# Patient Record
Sex: Female | Born: 1935
Health system: Southern US, Community
[De-identification: ages and names within clinical notes are randomized; demographics above are authoritative.]

## PROBLEM LIST (undated history)

## (undated) DIAGNOSIS — N3281 Overactive bladder: Secondary | ICD-10-CM

## (undated) DIAGNOSIS — M1710 Unilateral primary osteoarthritis, unspecified knee: Secondary | ICD-10-CM

## (undated) DIAGNOSIS — I454 Nonspecific intraventricular block: Secondary | ICD-10-CM

## (undated) DIAGNOSIS — E785 Hyperlipidemia, unspecified: Secondary | ICD-10-CM

## (undated) DIAGNOSIS — T887XXA Unspecified adverse effect of drug or medicament, initial encounter: Secondary | ICD-10-CM

## (undated) DIAGNOSIS — J0100 Acute maxillary sinusitis, unspecified: Secondary | ICD-10-CM

## (undated) DIAGNOSIS — M810 Age-related osteoporosis without current pathological fracture: Secondary | ICD-10-CM

## (undated) DIAGNOSIS — Z9889 Other specified postprocedural states: Secondary | ICD-10-CM

## (undated) DIAGNOSIS — C44311 Basal cell carcinoma of skin of nose: Secondary | ICD-10-CM

## (undated) DIAGNOSIS — T7840XA Allergy, unspecified, initial encounter: Secondary | ICD-10-CM

## (undated) DIAGNOSIS — K219 Gastro-esophageal reflux disease without esophagitis: Secondary | ICD-10-CM

## (undated) DIAGNOSIS — R112 Nausea with vomiting, unspecified: Secondary | ICD-10-CM

## (undated) DIAGNOSIS — IMO0002 Reserved for concepts with insufficient information to code with codable children: Secondary | ICD-10-CM

## (undated) DIAGNOSIS — G5600 Carpal tunnel syndrome, unspecified upper limb: Secondary | ICD-10-CM

## (undated) DIAGNOSIS — N814 Uterovaginal prolapse, unspecified: Secondary | ICD-10-CM

## (undated) HISTORY — DX: Gastro-esophageal reflux disease without esophagitis: K21.9

## (undated) HISTORY — PX: FLEXIBLE SIGMOIDOSCOPY: SHX1649

## (undated) HISTORY — DX: Uterovaginal prolapse, unspecified: N81.4

## (undated) HISTORY — DX: Allergy, unspecified, initial encounter: T78.40XA

## (undated) HISTORY — PX: JOINT REPLACEMENT: SHX530

## (undated) HISTORY — DX: Overactive bladder: N32.81

## (undated) HISTORY — DX: Reserved for concepts with insufficient information to code with codable children: IMO0002

## (undated) HISTORY — DX: Carpal tunnel syndrome, unspecified upper limb: G56.00

## (undated) HISTORY — DX: Nonspecific intraventricular block: I45.4

## (undated) HISTORY — DX: Acute maxillary sinusitis, unspecified: J01.00

## (undated) HISTORY — DX: Unspecified adverse effect of drug or medicament, initial encounter: T88.7XXA

## (undated) HISTORY — DX: Age-related osteoporosis without current pathological fracture: M81.0

## (undated) HISTORY — PX: HYSTEROSCOPY WITH D & C: SHX1775

## (undated) HISTORY — PX: UPPER GI ENDOSCOPY: SHX6162

## (undated) HISTORY — PX: COLONOSCOPY: SHX174

## (undated) HISTORY — DX: Basal cell carcinoma of skin of nose: C44.311

## (undated) HISTORY — DX: Unilateral primary osteoarthritis, unspecified knee: M17.10

## (undated) HISTORY — PX: OTHER SURGICAL HISTORY: SHX169

## (undated) HISTORY — PX: HYSTEROSCOPY: SHX211

## (undated) HISTORY — PX: EYE SURGERY: SHX253

## (undated) HISTORY — DX: Hyperlipidemia, unspecified: E78.5

---

## 1980-06-02 HISTORY — PX: DILATION AND CURETTAGE OF UTERUS: SHX78

## 1999-06-21 ENCOUNTER — Other Ambulatory Visit: Admission: RE | Admit: 1999-06-21 | Discharge: 1999-06-21 | Payer: Self-pay | Admitting: Obstetrics and Gynecology

## 2000-06-26 ENCOUNTER — Other Ambulatory Visit: Admission: RE | Admit: 2000-06-26 | Discharge: 2000-06-26 | Payer: Self-pay | Admitting: Obstetrics and Gynecology

## 2001-07-07 ENCOUNTER — Other Ambulatory Visit: Admission: RE | Admit: 2001-07-07 | Discharge: 2001-07-07 | Payer: Self-pay | Admitting: Obstetrics and Gynecology

## 2001-10-14 ENCOUNTER — Encounter: Payer: Self-pay | Admitting: Internal Medicine

## 2002-08-24 ENCOUNTER — Other Ambulatory Visit: Admission: RE | Admit: 2002-08-24 | Discharge: 2002-08-24 | Payer: Self-pay | Admitting: Obstetrics and Gynecology

## 2003-01-16 ENCOUNTER — Encounter: Payer: Self-pay | Admitting: Orthopedic Surgery

## 2003-01-23 ENCOUNTER — Encounter: Payer: Self-pay | Admitting: Orthopedic Surgery

## 2003-01-23 ENCOUNTER — Inpatient Hospital Stay (HOSPITAL_COMMUNITY): Admission: RE | Admit: 2003-01-23 | Discharge: 2003-01-27 | Payer: Self-pay | Admitting: Orthopedic Surgery

## 2003-07-21 ENCOUNTER — Encounter (INDEPENDENT_AMBULATORY_CARE_PROVIDER_SITE_OTHER): Payer: Self-pay | Admitting: *Deleted

## 2003-07-21 ENCOUNTER — Ambulatory Visit (HOSPITAL_BASED_OUTPATIENT_CLINIC_OR_DEPARTMENT_OTHER): Admission: RE | Admit: 2003-07-21 | Discharge: 2003-07-21 | Payer: Self-pay | Admitting: Obstetrics and Gynecology

## 2003-07-21 ENCOUNTER — Ambulatory Visit (HOSPITAL_COMMUNITY): Admission: RE | Admit: 2003-07-21 | Discharge: 2003-07-21 | Payer: Self-pay | Admitting: Obstetrics and Gynecology

## 2003-08-28 ENCOUNTER — Other Ambulatory Visit: Admission: RE | Admit: 2003-08-28 | Discharge: 2003-08-28 | Payer: Self-pay | Admitting: Obstetrics and Gynecology

## 2004-04-11 ENCOUNTER — Ambulatory Visit: Payer: Self-pay | Admitting: Internal Medicine

## 2004-07-04 ENCOUNTER — Ambulatory Visit: Payer: Self-pay | Admitting: Internal Medicine

## 2004-08-29 ENCOUNTER — Other Ambulatory Visit: Admission: RE | Admit: 2004-08-29 | Discharge: 2004-08-29 | Payer: Self-pay | Admitting: Obstetrics and Gynecology

## 2004-09-03 ENCOUNTER — Ambulatory Visit: Payer: Self-pay | Admitting: Internal Medicine

## 2004-09-12 ENCOUNTER — Ambulatory Visit: Payer: Self-pay | Admitting: Internal Medicine

## 2004-12-12 ENCOUNTER — Ambulatory Visit: Payer: Self-pay | Admitting: Internal Medicine

## 2005-02-13 ENCOUNTER — Ambulatory Visit: Payer: Self-pay | Admitting: Internal Medicine

## 2005-03-17 ENCOUNTER — Ambulatory Visit: Payer: Self-pay | Admitting: Internal Medicine

## 2005-04-17 ENCOUNTER — Ambulatory Visit: Payer: Self-pay | Admitting: Internal Medicine

## 2005-07-17 ENCOUNTER — Ambulatory Visit: Payer: Self-pay | Admitting: Internal Medicine

## 2005-09-10 ENCOUNTER — Other Ambulatory Visit: Admission: RE | Admit: 2005-09-10 | Discharge: 2005-09-10 | Payer: Self-pay | Admitting: Obstetrics and Gynecology

## 2005-09-15 ENCOUNTER — Ambulatory Visit: Payer: Self-pay | Admitting: Internal Medicine

## 2005-09-22 ENCOUNTER — Ambulatory Visit: Payer: Self-pay | Admitting: Internal Medicine

## 2005-12-22 ENCOUNTER — Ambulatory Visit: Payer: Self-pay | Admitting: Internal Medicine

## 2006-02-26 ENCOUNTER — Ambulatory Visit: Payer: Self-pay | Admitting: Internal Medicine

## 2006-04-16 ENCOUNTER — Ambulatory Visit: Payer: Self-pay | Admitting: Internal Medicine

## 2006-06-11 ENCOUNTER — Ambulatory Visit: Payer: Self-pay | Admitting: Internal Medicine

## 2006-10-15 ENCOUNTER — Ambulatory Visit: Payer: Self-pay | Admitting: Internal Medicine

## 2006-10-16 ENCOUNTER — Other Ambulatory Visit: Admission: RE | Admit: 2006-10-16 | Discharge: 2006-10-16 | Payer: Self-pay | Admitting: Obstetrics and Gynecology

## 2007-02-11 ENCOUNTER — Ambulatory Visit: Payer: Self-pay | Admitting: Internal Medicine

## 2007-02-11 LAB — CONVERTED CEMR LAB
ALT: 27 units/L (ref 0–35)
AST: 28 units/L (ref 0–37)
Basophils Relative: 0.8 % (ref 0.0–1.0)
Bilirubin, Direct: 0.1 mg/dL (ref 0.0–0.3)
CO2: 29 meq/L (ref 19–32)
Calcium: 9.4 mg/dL (ref 8.4–10.5)
Chloride: 108 meq/L (ref 96–112)
Creatinine, Ser: 0.8 mg/dL (ref 0.4–1.2)
Eosinophils Absolute: 0.2 10*3/uL (ref 0.0–0.6)
HCT: 39.4 % (ref 36.0–46.0)
HDL: 96.3 mg/dL (ref >39.0)
Hemoglobin: 13.2 g/dL (ref 12.0–15.0)
Monocytes Absolute: 0.4 10*3/uL (ref 0.2–0.7)
Nitrite: NEGATIVE
Platelets: 245 10*3/uL (ref 150–400)
RDW: 13 % (ref 11.5–14.6)
Specific Gravity, Urine: 1.015
TSH: 2.63 microintl units/mL (ref 0.35–5.50)
Total Protein: 6.3 g/dL (ref 6.0–8.3)
Urobilinogen, UA: 0.2
WBC Urine, dipstick: NEGATIVE
pH: 7.5

## 2007-02-15 DIAGNOSIS — M810 Age-related osteoporosis without current pathological fracture: Secondary | ICD-10-CM

## 2007-02-18 ENCOUNTER — Ambulatory Visit: Payer: Self-pay | Admitting: Internal Medicine

## 2007-02-18 DIAGNOSIS — E785 Hyperlipidemia, unspecified: Secondary | ICD-10-CM

## 2007-03-18 ENCOUNTER — Encounter: Payer: Self-pay | Admitting: Internal Medicine

## 2007-03-18 ENCOUNTER — Ambulatory Visit (HOSPITAL_BASED_OUTPATIENT_CLINIC_OR_DEPARTMENT_OTHER): Admission: RE | Admit: 2007-03-18 | Discharge: 2007-03-18 | Payer: Self-pay | Admitting: Obstetrics and Gynecology

## 2007-03-18 ENCOUNTER — Encounter: Payer: Self-pay | Admitting: Obstetrics and Gynecology

## 2007-03-26 ENCOUNTER — Telehealth: Payer: Self-pay | Admitting: *Deleted

## 2007-03-26 DIAGNOSIS — I454 Nonspecific intraventricular block: Secondary | ICD-10-CM | POA: Insufficient documentation

## 2007-04-02 ENCOUNTER — Ambulatory Visit: Payer: Self-pay | Admitting: Internal Medicine

## 2007-04-15 ENCOUNTER — Ambulatory Visit: Payer: Self-pay

## 2007-04-15 ENCOUNTER — Encounter: Payer: Self-pay | Admitting: Internal Medicine

## 2007-04-20 ENCOUNTER — Telehealth: Payer: Self-pay | Admitting: *Deleted

## 2007-05-20 ENCOUNTER — Ambulatory Visit: Payer: Self-pay | Admitting: Internal Medicine

## 2007-05-20 DIAGNOSIS — C44300 Unspecified malignant neoplasm of skin of unspecified part of face: Secondary | ICD-10-CM | POA: Insufficient documentation

## 2007-07-01 ENCOUNTER — Ambulatory Visit: Payer: Self-pay | Admitting: Internal Medicine

## 2007-09-23 ENCOUNTER — Ambulatory Visit: Payer: Self-pay | Admitting: Internal Medicine

## 2007-09-30 ENCOUNTER — Encounter: Payer: Self-pay | Admitting: Internal Medicine

## 2007-09-30 ENCOUNTER — Ambulatory Visit: Payer: Self-pay | Admitting: Internal Medicine

## 2007-10-21 ENCOUNTER — Other Ambulatory Visit: Admission: RE | Admit: 2007-10-21 | Discharge: 2007-10-21 | Payer: Self-pay | Admitting: Obstetrics and Gynecology

## 2007-10-21 ENCOUNTER — Telehealth: Payer: Self-pay | Admitting: *Deleted

## 2008-02-24 ENCOUNTER — Ambulatory Visit: Payer: Self-pay | Admitting: Internal Medicine

## 2008-02-24 DIAGNOSIS — T887XXA Unspecified adverse effect of drug or medicament, initial encounter: Secondary | ICD-10-CM | POA: Insufficient documentation

## 2008-02-24 LAB — CONVERTED CEMR LAB
ALT: 24 units/L (ref 0–35)
BUN: 27 mg/dL — ABNORMAL HIGH (ref 6–23)
Basophils Absolute: 0.1 10*3/uL (ref 0.0–0.1)
Basophils Relative: 1 % (ref 0.0–3.0)
Bilirubin, Direct: 0.1 mg/dL (ref 0.0–0.3)
CRP, High Sensitivity: 2 (ref 0.00–5.00)
Calcium: 10.3 mg/dL (ref 8.4–10.5)
Cholesterol: 271 mg/dL (ref 0–200)
Creatinine, Ser: 1 mg/dL (ref 0.4–1.2)
Eosinophils Absolute: 0.2 10*3/uL (ref 0.0–0.7)
HDL goal, serum: 40 mg/dL
HDL: 106.3 mg/dL (ref >39.0)
Hemoglobin: 13.8 g/dL (ref 12.0–15.0)
LDL Goal: 160 mg/dL
Monocytes Absolute: 0.4 10*3/uL (ref 0.1–1.0)
Monocytes Relative: 6.9 % (ref 3.0–12.0)
Neutro Abs: 3 10*3/uL (ref 1.4–7.7)
Platelets: 216 10*3/uL (ref 150–400)
RBC: 4.42 M/uL (ref 3.87–5.11)
TSH: 1.76 microintl units/mL (ref 0.35–5.50)
Total CHOL/HDL Ratio: 2.5
Total Protein: 6.9 g/dL (ref 6.0–8.3)
WBC: 5.7 10*3/uL (ref 4.5–10.5)

## 2008-03-27 ENCOUNTER — Ambulatory Visit: Payer: Self-pay | Admitting: Internal Medicine

## 2008-03-27 ENCOUNTER — Telehealth: Payer: Self-pay | Admitting: Internal Medicine

## 2008-03-27 DIAGNOSIS — B009 Herpesviral infection, unspecified: Secondary | ICD-10-CM | POA: Insufficient documentation

## 2008-04-01 LAB — CONVERTED CEMR LAB: Pap Smear: NORMAL

## 2008-06-26 ENCOUNTER — Ambulatory Visit: Payer: Self-pay | Admitting: Obstetrics and Gynecology

## 2008-08-16 ENCOUNTER — Encounter: Payer: Self-pay | Admitting: Internal Medicine

## 2008-08-16 LAB — HM MAMMOGRAPHY

## 2008-08-17 ENCOUNTER — Ambulatory Visit: Payer: Self-pay | Admitting: Internal Medicine

## 2008-09-05 ENCOUNTER — Telehealth (INDEPENDENT_AMBULATORY_CARE_PROVIDER_SITE_OTHER): Payer: Self-pay | Admitting: *Deleted

## 2008-09-14 ENCOUNTER — Ambulatory Visit: Payer: Self-pay | Admitting: Internal Medicine

## 2008-09-14 DIAGNOSIS — J0100 Acute maxillary sinusitis, unspecified: Secondary | ICD-10-CM

## 2008-09-14 DIAGNOSIS — K219 Gastro-esophageal reflux disease without esophagitis: Secondary | ICD-10-CM

## 2008-09-14 DIAGNOSIS — G5600 Carpal tunnel syndrome, unspecified upper limb: Secondary | ICD-10-CM

## 2008-11-09 ENCOUNTER — Ambulatory Visit: Payer: Self-pay | Admitting: Obstetrics and Gynecology

## 2008-11-09 ENCOUNTER — Telehealth: Payer: Self-pay | Admitting: Internal Medicine

## 2008-11-21 LAB — HM DIABETES FOOT EXAM

## 2008-12-28 ENCOUNTER — Ambulatory Visit: Payer: Self-pay | Admitting: Internal Medicine

## 2009-01-15 ENCOUNTER — Telehealth (INDEPENDENT_AMBULATORY_CARE_PROVIDER_SITE_OTHER): Payer: Self-pay | Admitting: *Deleted

## 2009-03-16 ENCOUNTER — Ambulatory Visit: Payer: Self-pay | Admitting: Family Medicine

## 2009-03-30 ENCOUNTER — Ambulatory Visit: Payer: Self-pay | Admitting: Internal Medicine

## 2009-03-30 LAB — CONVERTED CEMR LAB
ALT: 27 units/L (ref 0–35)
AST: 31 units/L (ref 0–37)
Albumin: 4 g/dL (ref 3.5–5.2)
Alkaline Phosphatase: 59 units/L (ref 39–117)
BUN: 20 mg/dL (ref 6–23)
Basophils Absolute: 0 10*3/uL (ref 0.0–0.1)
Basophils Relative: 0.5 % (ref 0.0–3.0)
Bilirubin, Direct: 0.1 mg/dL (ref 0.0–0.3)
CO2: 29 meq/L (ref 19–32)
Calcium: 9.2 mg/dL (ref 8.4–10.5)
Chloride: 104 meq/L (ref 96–112)
Cholesterol: 229 mg/dL — ABNORMAL HIGH (ref 0–200)
Creatinine, Ser: 0.9 mg/dL (ref 0.4–1.2)
Direct LDL: 116.7 mg/dL
Eosinophils Absolute: 0.2 10*3/uL (ref 0.0–0.7)
Eosinophils Relative: 4 % (ref 0.0–5.0)
GFR calc non Af Amer: 64.56 mL/min (ref >60)
Glucose, Bld: 96 mg/dL (ref 70–99)
HCT: 41 % (ref 36.0–46.0)
HDL: 92.8 mg/dL (ref >39.00)
Hemoglobin: 13.7 g/dL (ref 12.0–15.0)
Lymphocytes Relative: 38.9 % (ref 12.0–46.0)
Lymphs Abs: 1.9 10*3/uL (ref 0.7–4.0)
MCHC: 33.3 g/dL (ref 30.0–36.0)
MCV: 91.8 fL (ref 78.0–100.0)
Monocytes Absolute: 0.3 10*3/uL (ref 0.1–1.0)
Monocytes Relative: 6.5 % (ref 3.0–12.0)
Neutro Abs: 2.5 10*3/uL (ref 1.4–7.7)
Neutrophils Relative %: 50.1 % (ref 43.0–77.0)
Platelets: 217 10*3/uL (ref 150.0–400.0)
Potassium: 3.7 meq/L (ref 3.5–5.1)
RBC: 4.47 M/uL (ref 3.87–5.11)
RDW: 13.8 % (ref 11.5–14.6)
Sodium: 145 meq/L (ref 135–145)
TSH: 1.86 microintl units/mL (ref 0.35–5.50)
Total Bilirubin: 0.8 mg/dL (ref 0.3–1.2)
Total Protein: 7.7 g/dL (ref 6.0–8.3)
WBC: 4.9 10*3/uL (ref 4.5–10.5)

## 2009-04-04 ENCOUNTER — Telehealth: Payer: Self-pay | Admitting: Internal Medicine

## 2009-06-02 HISTORY — PX: CARPAL TUNNEL RELEASE: SHX101

## 2009-07-05 ENCOUNTER — Ambulatory Visit: Payer: Self-pay | Admitting: Internal Medicine

## 2009-07-05 DIAGNOSIS — J309 Allergic rhinitis, unspecified: Secondary | ICD-10-CM

## 2009-09-19 ENCOUNTER — Encounter: Payer: Self-pay | Admitting: Internal Medicine

## 2009-10-04 ENCOUNTER — Ambulatory Visit: Payer: Self-pay | Admitting: Internal Medicine

## 2009-10-04 LAB — CONVERTED CEMR LAB
AST: 27 units/L (ref 0–37)
Alkaline Phosphatase: 54 units/L (ref 39–117)
Basophils Absolute: 0 10*3/uL (ref 0.0–0.1)
Bilirubin, Direct: 0.1 mg/dL (ref 0.0–0.3)
Hemoglobin: 13.8 g/dL (ref 12.0–15.0)
Lymphocytes Relative: 35.9 % (ref 12.0–46.0)
Monocytes Relative: 5.8 % (ref 3.0–12.0)
Neutro Abs: 3.1 10*3/uL (ref 1.4–7.7)
RBC: 4.51 M/uL (ref 3.87–5.11)
RDW: 15 % — ABNORMAL HIGH (ref 11.5–14.6)
Total CHOL/HDL Ratio: 2
Total Protein: 6.9 g/dL (ref 6.0–8.3)
VLDL: 14.2 mg/dL (ref 0.0–40.0)
Vit D, 25-Hydroxy: 55 ng/mL (ref 30–89)

## 2009-10-11 ENCOUNTER — Ambulatory Visit: Payer: Self-pay | Admitting: Internal Medicine

## 2009-11-15 ENCOUNTER — Other Ambulatory Visit: Admission: RE | Admit: 2009-11-15 | Discharge: 2009-11-15 | Payer: Self-pay | Admitting: Obstetrics and Gynecology

## 2009-11-15 ENCOUNTER — Ambulatory Visit: Payer: Self-pay | Admitting: Obstetrics and Gynecology

## 2009-12-07 ENCOUNTER — Telehealth: Payer: Self-pay | Admitting: Internal Medicine

## 2009-12-20 ENCOUNTER — Ambulatory Visit: Payer: Self-pay | Admitting: Obstetrics and Gynecology

## 2009-12-20 ENCOUNTER — Encounter: Payer: Self-pay | Admitting: Internal Medicine

## 2010-01-10 ENCOUNTER — Ambulatory Visit: Payer: Self-pay | Admitting: Internal Medicine

## 2010-01-10 DIAGNOSIS — T6391XA Toxic effect of contact with unspecified venomous animal, accidental (unintentional), initial encounter: Secondary | ICD-10-CM | POA: Insufficient documentation

## 2010-02-14 ENCOUNTER — Ambulatory Visit: Payer: Self-pay | Admitting: Internal Medicine

## 2010-05-09 ENCOUNTER — Ambulatory Visit: Payer: Self-pay | Admitting: Internal Medicine

## 2010-05-09 LAB — CONVERTED CEMR LAB
Alkaline Phosphatase: 55 units/L (ref 39–117)
BUN: 17 mg/dL (ref 6–23)
Bilirubin, Direct: 0.1 mg/dL (ref 0.0–0.3)
CO2: 29 meq/L (ref 19–32)
Calcium: 9.7 mg/dL (ref 8.4–10.5)
Glucose, Bld: 100 mg/dL — ABNORMAL HIGH (ref 70–99)
HDL: 98.6 mg/dL (ref >39.00)
Potassium: 4.3 meq/L (ref 3.5–5.1)
Sodium: 141 meq/L (ref 135–145)
Total Bilirubin: 0.6 mg/dL (ref 0.3–1.2)
VLDL: 12.6 mg/dL (ref 0.0–40.0)

## 2010-05-16 ENCOUNTER — Ambulatory Visit: Payer: Self-pay | Admitting: Internal Medicine

## 2010-05-16 DIAGNOSIS — H409 Unspecified glaucoma: Secondary | ICD-10-CM | POA: Insufficient documentation

## 2010-07-04 NOTE — Progress Notes (Signed)
Summary: INFO ONLY  Phone Note Call from Patient   Caller: Patient  [phone] Summary of Call: Pt called in to speak with [facility], LPN... Pt was adv that [place], LPN is out of office / will return on 12/10/2009... Pt adv that was fine, she just wanted to leave msg:  Pt adv she is supposed to have a Bone Density Test done on December 20, 2009 and she will be having this done at [facility].... Results will be sent to Dr Lovell Sheehan.  Initial call taken by: Debbra Riding,  December 07, 2009 2:25 PM

## 2010-07-04 NOTE — Assessment & Plan Note (Signed)
Summary: 3 month rov/njr   Vital Signs:  Patient profile:   77 year old female Height:      65 inches Weight:      152 pounds BMI:     25.39 Temp:     98.2 degrees F oral Pulse rate:   72 / minute Resp:     14 per minute BP sitting:   124 / 76  (left arm)  Vitals Entered By: Willy Eddy, LPN (January 10, 2010 9:09 AM) CC: roa bone density- bee sting on rt upper arm with redness and swelling, Lipid Management Is Patient Diabetic? No   CC:  roa bone density- bee sting on rt upper arm with redness and swelling and Lipid Management.  History of Present Illness: Had a bee sting yesterday and now has an area of reaction 8 x 6 cm other wise she remains active in swiming and exercize she is using saline and this has helped bone denstity reviewed and she is stable "osteopenia"  Lipid Management History:      Positive NCEP/ATP III risk factors include female age 69 years old or older.  Negative NCEP/ATP III risk factors include no history of early menopause without estrogen hormone replacement, non-diabetic, HDL cholesterol greater than 60, no family history for ischemic heart disease, non-tobacco-user status, non-hypertensive, no ASHD (atherosclerotic heart disease), no prior stroke/TIA, and no peripheral vascular disease.        The patient states that she knows about the "Therapeutic Lifestyle Change" diet.     Preventive Screening-Counseling & Management  Alcohol-Tobacco     Smoking Status: never     Passive Smoke Exposure: no     Tobacco Counseling: not indicated; no tobacco use  Problems Prior to Update: 1)  Allergic Rhinitis Cause Unspecified  (ICD-477.9) 2)  Carpal Tunnel Syndrome, Right  (ICD-354.0) 3)  Gerd  (ICD-530.81) 4)  Acute Maxillary Sinusitis  (ICD-461.0) 5)  Fever Blister  (ICD-054.9) 6)  Dysfunction of Eustachian Tube  (ICD-381.81) 7)  Uns Advrs Eff Uns Rx Medicinal&biological Sbstnc  (ICD-995.20) 8)  Cellulitis and Abscess of Face  (ICD-682.0) 9)  Basal  Cell Carcinoma, Nose  (ICD-173.3) 10)  Bundle Branch Block  (ICD-426.50) 11)  Preventive Health Care  (ICD-V70.0) 12)  Osteoarthrosis, Local Nos, Lower Leg  (ICD-715.36) 13)  Hyperlipidemia  (ICD-272.4) 14)  Family History Diabetes 1st Degree Relative  (ICD-V18.0) 15)  Family History Breast Cancer 1st Degree Relative <50  (ICD-V16.3) 16)  Osteoporosis  (ICD-733.00)  Current Problems (verified): 1)  Allergic Rhinitis Cause Unspecified  (ICD-477.9) 2)  Carpal Tunnel Syndrome, Right  (ICD-354.0) 3)  Gerd  (ICD-530.81) 4)  Acute Maxillary Sinusitis  (ICD-461.0) 5)  Fever Blister  (ICD-054.9) 6)  Dysfunction of Eustachian Tube  (ICD-381.81) 7)  Uns Advrs Eff Uns Rx Medicinal&biological Sbstnc  (ICD-995.20) 8)  Cellulitis and Abscess of Face  (ICD-682.0) 9)  Basal Cell Carcinoma, Nose  (ICD-173.3) 10)  Bundle Branch Block  (ICD-426.50) 11)  Preventive Health Care  (ICD-V70.0) 12)  Osteoarthrosis, Local Nos, Lower Leg  (ICD-715.36) 13)  Hyperlipidemia  (ICD-272.4) 14)  Family History Diabetes 1st Degree Relative  (ICD-V18.0) 15)  Family History Breast Cancer 1st Degree Relative <50  (ICD-V16.3) 16)  Osteoporosis  (ICD-733.00)  Medications Prior to Update: 1)  Adult Aspirin Ec Low Strength 81 Mg  Tbec (Aspirin) .... Once Daily 2)  Bl Vitamin E 400 Unit  Caps (Vitamin E) .... Once Daily 3)  Mobic 15 Mg Tabs (Meloxicam) .Marland Kitchen.. 1 Once Daily 4)  Calcium 600 1500 Mg  Tabs (Calcium Carbonate) .... Two Once Daily 5)  Calcium-Magnesium-Zinc 1000-400-15 Mg  Tabs (Calcium-Magnesium-Zinc) .... 3 Once Daily 6)  Vitamin C 1000 Mg  Tabs (Ascorbic Acid) .... Once Daily 7)  Womens Multivitamin Plus   Tabs (Multiple Vitamins-Minerals) .... Once Daily 8)  Fish Oil 1000 Mg  Caps (Omega-3 Fatty Acids) .... 4 Once Daily 9)  Vitamin D-1000 Max St [phone] Mg-Unit  Tabs (Calcium-Vitamin D) .Marland Kitchen.. 1` Once Daily 10)  Nasonex 50 Mcg/act Susp (Mometasone Furoate) .... Two Sprays Q Nare Daily 11)  Zovirax 5 % Oint  (Acyclovir) .... Apply To Site As Needed Up To 5 Times A Day 12)  Prilosec 20 Mg Cpdr (Omeprazole) .... One By Mouth Daily 13)  Vitamin B-6 Cr 200 Mg Cr-Tabs (Pyridoxine Hcl) .Marland Kitchen.. 1 Once Daily  Current Medications (verified): 1)  Adult Aspirin Ec Low Strength 81 Mg  Tbec (Aspirin) .... Once Daily 2)  Bl Vitamin E 400 Unit  Caps (Vitamin E) .... Once Daily 3)  Mobic 15 Mg Tabs (Meloxicam) .Marland Kitchen.. 1 Once Daily 4)  Calcium 600 1500 Mg  Tabs (Calcium Carbonate) .... Two Once Daily 5)  Calcium-Magnesium-Zinc 1000-400-15 Mg  Tabs (Calcium-Magnesium-Zinc) .... 3 Once Daily 6)  Vitamin C 1000 Mg  Tabs (Ascorbic Acid) .... Once Daily 7)  Womens Multivitamin Plus   Tabs (Multiple Vitamins-Minerals) .... Once Daily 8)  Fish Oil 1000 Mg  Caps (Omega-3 Fatty Acids) .... 4 Once Daily 9)  Vitamin D-1000 Max St [phone] Mg-Unit  Tabs (Calcium-Vitamin D) .Marland Kitchen.. 1` Once Daily 10)  Nasonex 50 Mcg/act Susp (Mometasone Furoate) .... Two Sprays Q Nare Daily 11)  Zovirax 5 % Oint (Acyclovir) .... Apply To Site As Needed Up To 5 Times A Day 12)  Prilosec 20 Mg Cpdr (Omeprazole) .... One By Mouth Daily 13)  Vitamin B-6 Cr 200 Mg Cr-Tabs (Pyridoxine Hcl) .Marland Kitchen.. 1 Once Daily  Allergies (verified): 1)  ! * Aleve 2)  ! Relafen 3)  ! Tramadol Hcl (Tramadol Hcl) 4)  * Voltarin Gel  Past History:  Family History: Last updated: 02/15/2007 Family History Breast cancer 1st degree relative <50 Family History Diabetes 1st degree relative Family History of Stroke M 1st degree relative <50 Family History of Neurological disorder  Social History: Last updated: 02/15/2007 Occupation: Married Former Smoker Alcohol use-yes Drug use-no  Risk Factors: Smoking Status: never (01/10/2010) Passive Smoke Exposure: no (01/10/2010)  Past medical, surgical, family and social histories (including risk factors) reviewed, and no changes noted (except as noted below).  Past Medical History: Reviewed history from 09/14/2008 and no  changes required. ( updated 09/14/2008) CARPAL TUNNEL SYNDROME, RIGHT (ICD-354.0) GERD (ICD-530.81) ACUTE MAXILLARY SINUSITIS (ICD-461.0) UNS ADVRS EFF UNS RX MEDICINAL&BIOLOGICAL SBSTNC (ICD-995.20) BASAL CELL CARCINOMA, NOSE (ICD-173.3) BUNDLE BRANCH BLOCK (ICD-426.50) OSTEOARTHROSIS, LOCAL NOS, LOWER LEG (ICD-715.36) HYPERLIPIDEMIA (ICD-272.4) FAMILY HISTORY DIABETES 1ST DEGREE RELATIVE (ICD-V18.0) FAMILY HISTORY BREAST CANCER 1ST DEGREE RELATIVE <50 (ICD-V16.3) OSTEOPOROSIS (ICD-733.00)  Past Surgical History: Reviewed history from 02/18/2007 and no changes required. Arthroscopic R Knee Flexible Sigmoidoscopy Total hip replacement bilateral  Family History: Reviewed history from 02/15/2007 and no changes required. Family History Breast cancer 1st degree relative <50 Family History Diabetes 1st degree relative Family History of Stroke M 1st degree relative <50 Family History of Neurological disorder  Social History: Reviewed history from 02/15/2007 and no changes required. Occupation: Married Former Smoker Alcohol use-yes Drug use-no  Review of Systems  The patient denies anorexia, fever, weight loss, weight gain, vision loss, decreased  hearing, hoarseness, chest pain, syncope, dyspnea on exertion, peripheral edema, prolonged cough, headaches, hemoptysis, abdominal pain, melena, hematochezia, severe indigestion/heartburn, hematuria, incontinence, genital sores, muscle weakness, suspicious skin lesions, transient blindness, difficulty walking, depression, unusual weight change, abnormal bleeding, enlarged lymph nodes, angioedema, and breast masses.    Physical Exam  General:  patient is alert and in no distress Head:  normocephalic and atraumatic.   Eyes:  pupils equal and pupils round.   Ears:  R ear normal and L ear normal.   Nose:  no external deformity and no nasal discharge.   Mouth:  pharynx pink and moist and no erythema.   Neck:  No deformities, masses, or  tenderness noted. Lungs:  clear to auscultation throughout with symmetric breath sounds. Heart:  regular rhythm and rate Abdomen:  soft, non-tender, normal bowel sounds, no distention, and no masses.   Msk:  no joint warmth, no redness over joints, joint swelling, and enlarged MCP joints.   Extremities:  trace left pedal edema and trace right pedal edema.   Neurologic:  alert & oriented X3 and DTRs symmetrical and normal.     Impression & Recommendations:  Problem # 1:  ALLERGIC RHINITIS CAUSE UNSPECIFIED (ICD-477.9)  use of salt water lavage discussed Her updated medication list for this problem includes:    Nasonex 50 Mcg/act Susp (Mometasone furoate) .Marland Kitchen..Marland Kitchen Two sprays q nare daily  Discussed use of allergy medications and environmental measures.   Problem # 2:  CARPAL TUNNEL SYNDROME, RIGHT (ICD-354.0)  surgery april 29th and just getting back all sensation  Labs Reviewed: TSH: 3.80 (10/04/2009)     Problem # 3:  OSTEOARTHROSIS, LOCAL NOS, LOWER LEG (ICD-715.36)  Her updated medication list for this problem includes:    Adult Aspirin Ec Low Strength 81 Mg Tbec (Aspirin) ..... Once daily    Mobic 15 Mg Tabs (Meloxicam) .Marland Kitchen... 1 once daily  Discussed use of medications, application of heat or cold, and exercises.   Problem # 4:  OSTEOPENIA (ICD-733.90)  on calcum and vit d  Discussed medication use, applications of heat or ice, and exercises.   Complete Medication List: 1)  Adult Aspirin Ec Low Strength 81 Mg Tbec (Aspirin) .... Once daily 2)  Bl Vitamin E 400 Unit Caps (Vitamin e) .... Once daily 3)  Mobic 15 Mg Tabs (Meloxicam) .Marland Kitchen.. 1 once daily 4)  Calcium 600 1500 Mg Tabs (Calcium carbonate) .... Two once daily 5)  Calcium-magnesium-zinc 1000-400-15 Mg Tabs (Calcium-magnesium-zinc) .... 3 once daily 6)  Vitamin C 1000 Mg Tabs (Ascorbic acid) .... Once daily 7)  Womens Multivitamin Plus Tabs (Multiple vitamins-minerals) .... Once daily 8)  Fish Oil 1000 Mg Caps  (Omega-3 fatty acids) .... 4 once daily 9)  Vitamin D-1000 Max St [phone] Mg-unit Tabs (Calcium-vitamin d) .Marland Kitchen.. 1` once daily 10)  Nasonex 50 Mcg/act Susp (Mometasone furoate) .... Two sprays q nare daily 11)  Zovirax 5 % Oint (Acyclovir) .... Apply to site as needed up to 5 times a day 12)  Prilosec 20 Mg Cpdr (Omeprazole) .... One by mouth daily 13)  Vitamin B-6 Cr 200 Mg Cr-tabs (Pyridoxine hcl) .Marland Kitchen.. 1 once daily  Lipid Assessment/Plan:      Based on NCEP/ATP III, the patient's risk factor category is "0-1 risk factors".  The patient's lipid goals are as follows: Total cholesterol goal is 200; LDL cholesterol goal is 160; HDL cholesterol goal is 40; Triglyceride goal is 150.  Her LDL cholesterol goal has been met.    Patient Instructions:  1)  Please schedule a follow-up appointment in 4 months. 2)  BMP prior to visit, ICD-9:995.20 3)  Hepatic Panel prior to visit, ICD-9:995.20 4)  Lipid Panel prior to visit, ICD-9:272.0

## 2010-07-04 NOTE — Assessment & Plan Note (Signed)
Summary: FLU SHOT/CJR  Nurse Visit   Review of Systems       Flu Vaccine Consent Questions     Do you have a history of severe allergic reactions to this vaccine? no    Any prior history of allergic reactions to egg and/or gelatin? no    Do you have a sensitivity to the preservative Thimersol? no    Do you have a past history of Guillan-Barre Syndrome? no    Do you currently have an acute febrile illness? no    Have you ever had a severe reaction to latex? no    Vaccine information given and explained to patient? yes    Are you currently pregnant? no    Lot Number:[id number]   Exp Date:11/30/2010   Site Given  Left Deltoid IM Josph Macho RMA  February 14, 2010 3:11 PM    Allergies: 1)  ! * Aleve 2)  ! Relafen 3)  ! Tramadol Hcl (Tramadol Hcl) 4)  * Voltarin Gel  Orders Added: 1)  Flu Vaccine 84yrs + MEDICARE PATIENTS [Q2039] 2)  Administration Flu vaccine - MCR [G0008]

## 2010-07-04 NOTE — Assessment & Plan Note (Signed)
Summary: 3 month rov/njr   Vital Signs:  Patient profile:   77 year old female Height:      65 inches Weight:      155 pounds BMI:     25.89 Temp:     98.2 degrees F oral Pulse rate:   72 / minute Resp:     14 per minute BP sitting:   130 / 78  (left arm)  Vitals Entered By: Willy Eddy, LPN (Oct 11, 2009 10:28 AM) CC: roa labs   CC:  roa labs.  History of Present Illness: The pt has multiple lab studies to reveiw mobic for arthritis is the only drug the pt feels that she is  prone for" dementia"  Hyperlipidemia Follow-Up      This is a 77 year old woman who presents for Hyperlipidemia follow-up.  The patient denies muscle aches, GI upset, abdominal pain, flushing, itching, constipation, diarrhea, and fatigue.  The patient denies the following symptoms: chest pain/pressure, exercise intolerance, dypsnea, palpitations, syncope, and pedal edema.  Dietary compliance has been excellent.  The patient reports exercising daily.    Preventive Screening-Counseling & Management  Alcohol-Tobacco     Smoking Status: never     Passive Smoke Exposure: no  Problems Prior to Update: 1)  Allergic Rhinitis Cause Unspecified  (ICD-477.9) 2)  Carpal Tunnel Syndrome, Right  (ICD-354.0) 3)  Gerd  (ICD-530.81) 4)  Acute Maxillary Sinusitis  (ICD-461.0) 5)  Fever Blister  (ICD-054.9) 6)  Dysfunction of Eustachian Tube  (ICD-381.81) 7)  Uns Advrs Eff Uns Rx Medicinal&biological Sbstnc  (ICD-995.20) 8)  Cellulitis and Abscess of Face  (ICD-682.0) 9)  Basal Cell Carcinoma, Nose  (ICD-173.3) 10)  Bundle Branch Block  (ICD-426.50) 11)  Preventive Health Care  (ICD-V70.0) 12)  Osteoarthrosis, Local Nos, Lower Leg  (ICD-715.36) 13)  Hyperlipidemia  (ICD-272.4) 14)  Family History Diabetes 1st Degree Relative  (ICD-V18.0) 15)  Family History Breast Cancer 1st Degree Relative <50  (ICD-V16.3) 16)  Osteoporosis  (ICD-733.00)  Current Problems (verified): 1)  Allergic Rhinitis Cause  Unspecified  (ICD-477.9) 2)  Carpal Tunnel Syndrome, Right  (ICD-354.0) 3)  Gerd  (ICD-530.81) 4)  Acute Maxillary Sinusitis  (ICD-461.0) 5)  Fever Blister  (ICD-054.9) 6)  Dysfunction of Eustachian Tube  (ICD-381.81) 7)  Uns Advrs Eff Uns Rx Medicinal&biological Sbstnc  (ICD-995.20) 8)  Cellulitis and Abscess of Face  (ICD-682.0) 9)  Basal Cell Carcinoma, Nose  (ICD-173.3) 10)  Bundle Branch Block  (ICD-426.50) 11)  Preventive Health Care  (ICD-V70.0) 12)  Osteoarthrosis, Local Nos, Lower Leg  (ICD-715.36) 13)  Hyperlipidemia  (ICD-272.4) 14)  Family History Diabetes 1st Degree Relative  (ICD-V18.0) 15)  Family History Breast Cancer 1st Degree Relative <50  (ICD-V16.3) 16)  Osteoporosis  (ICD-733.00)  Medications Prior to Update: 1)  Adult Aspirin Ec Low Strength 81 Mg  Tbec (Aspirin) .... Once Daily 2)  Bl Vitamin E 400 Unit  Caps (Vitamin E) .... Once Daily 3)  Mobic 15 Mg Tabs (Meloxicam) .Marland Kitchen.. 1 Once Daily 4)  Calcium 600 1500 Mg  Tabs (Calcium Carbonate) .... Two Once Daily 5)  Calcium-Magnesium-Zinc 1000-400-15 Mg  Tabs (Calcium-Magnesium-Zinc) .... 3 Once Daily 6)  Vitamin C 1000 Mg  Tabs (Ascorbic Acid) .... Once Daily 7)  Womens Multivitamin Plus   Tabs (Multiple Vitamins-Minerals) .... Once Daily 8)  Fish Oil 1000 Mg  Caps (Omega-3 Fatty Acids) .... 4 Once Daily 9)  Vitamin D-1000 Max St [phone] Mg-Unit  Tabs (Calcium-Vitamin D) .Marland Kitchen.. 1`  Once Daily 10)  Nasonex 50 Mcg/act Susp (Mometasone Furoate) .... Two Sprays Q Nare Daily 11)  Zovirax 5 % Oint (Acyclovir) .... Apply To Site As Needed Up To 5 Times A Day 12)  Astepro 0.15 % Soln (Azelastine Hcl) .... Two Sprays Q Nare Daily 13)  Prilosec 20 Mg Cpdr (Omeprazole) .... One By Mouth Daily  Current Medications (verified): 1)  Adult Aspirin Ec Low Strength 81 Mg  Tbec (Aspirin) .... Once Daily 2)  Bl Vitamin E 400 Unit  Caps (Vitamin E) .... Once Daily 3)  Mobic 15 Mg Tabs (Meloxicam) .Marland Kitchen.. 1 Once Daily 4)  Calcium 600 1500  Mg  Tabs (Calcium Carbonate) .... Two Once Daily 5)  Calcium-Magnesium-Zinc 1000-400-15 Mg  Tabs (Calcium-Magnesium-Zinc) .... 3 Once Daily 6)  Vitamin C 1000 Mg  Tabs (Ascorbic Acid) .... Once Daily 7)  Womens Multivitamin Plus   Tabs (Multiple Vitamins-Minerals) .... Once Daily 8)  Fish Oil 1000 Mg  Caps (Omega-3 Fatty Acids) .... 4 Once Daily 9)  Vitamin D-1000 Max St [phone] Mg-Unit  Tabs (Calcium-Vitamin D) .Marland Kitchen.. 1` Once Daily 10)  Nasonex 50 Mcg/act Susp (Mometasone Furoate) .... Two Sprays Q Nare Daily 11)  Zovirax 5 % Oint (Acyclovir) .... Apply To Site As Needed Up To 5 Times A Day 12)  Astepro 0.15 % Soln (Azelastine Hcl) .... Two Sprays Q Nare Daily 13)  Prilosec 20 Mg Cpdr (Omeprazole) .... One By Mouth Daily 14)  Vitamin B-6 Cr 200 Mg Cr-Tabs (Pyridoxine Hcl) .Marland Kitchen.. 1 Once Daily  Allergies (verified): 1)  ! * Aleve 2)  ! Relafen 3)  ! Tramadol Hcl (Tramadol Hcl) 4)  * Voltarin Gel  Past History:  Family History: Last updated: 02/15/2007 Family History Breast cancer 1st degree relative <50 Family History Diabetes 1st degree relative Family History of Stroke M 1st degree relative <50 Family History of Neurological disorder  Social History: Last updated: 02/15/2007 Occupation: Married Former Smoker Alcohol use-yes Drug use-no  Risk Factors: Smoking Status: never (10/11/2009) Passive Smoke Exposure: no (10/11/2009)  Past medical, surgical, family and social histories (including risk factors) reviewed, and no changes noted (except as noted below).  Past Medical History: Reviewed history from 09/14/2008 and no changes required. ( updated 09/14/2008) CARPAL TUNNEL SYNDROME, RIGHT (ICD-354.0) GERD (ICD-530.81) ACUTE MAXILLARY SINUSITIS (ICD-461.0) UNS ADVRS EFF UNS RX MEDICINAL&BIOLOGICAL SBSTNC (ICD-995.20) BASAL CELL CARCINOMA, NOSE (ICD-173.3) BUNDLE BRANCH BLOCK (ICD-426.50) OSTEOARTHROSIS, LOCAL NOS, LOWER LEG (ICD-715.36) HYPERLIPIDEMIA (ICD-272.4) FAMILY  HISTORY DIABETES 1ST DEGREE RELATIVE (ICD-V18.0) FAMILY HISTORY BREAST CANCER 1ST DEGREE RELATIVE <50 (ICD-V16.3) OSTEOPOROSIS (ICD-733.00)  Past Surgical History: Reviewed history from 02/18/2007 and no changes required. Arthroscopic R Knee Flexible Sigmoidoscopy Total hip replacement bilateral  Family History: Reviewed history from 02/15/2007 and no changes required. Family History Breast cancer 1st degree relative <50 Family History Diabetes 1st degree relative Family History of Stroke M 1st degree relative <50 Family History of Neurological disorder  Social History: Reviewed history from 02/15/2007 and no changes required. Occupation: Married Former Smoker Alcohol use-yes Drug use-no  Review of Systems  The patient denies anorexia, fever, weight loss, weight gain, vision loss, decreased hearing, hoarseness, chest pain, syncope, dyspnea on exertion, peripheral edema, prolonged cough, headaches, hemoptysis, abdominal pain, melena, hematochezia, severe indigestion/heartburn, hematuria, incontinence, genital sores, muscle weakness, suspicious skin lesions, transient blindness, difficulty walking, depression, unusual weight change, abnormal bleeding, enlarged lymph nodes, angioedema, and breast masses.    Physical Exam  General:  patient is alert and in no distress Head:  normocephalic and  atraumatic.   Eyes:  pupils equal and pupils round.   Ears:  R ear normal and L ear normal.   Nose:  no external deformity and no nasal discharge.   Mouth:  pharynx pink and moist and no erythema.   Neck:  No deformities, masses, or tenderness noted. Lungs:  clear to auscultation throughout with symmetric breath sounds. Heart:  regular rhythm and rate Abdomen:  soft, non-tender, normal bowel sounds, no distention, and no masses.     Impression & Recommendations:  Problem # 1:  HYPERLIPIDEMIA (ICD-272.4)  Labs Reviewed: SGOT: 27 (10/04/2009)   SGPT: 25 (10/04/2009)  Lipid  Goals: Chol Goal: 200 (02/24/2008)   HDL Goal: 40 (02/24/2008)   LDL Goal: 160 (02/24/2008)   TG Goal: 150 (02/24/2008)  Prior 10 Yr Risk Heart Disease: Not enough information (02/24/2008)   HDL:113.20 (10/04/2009), 92.80 (03/30/2009)  LDL:DEL (02/24/2008), DEL (02/11/2007)  Chol:255 (10/04/2009), 229 (03/30/2009)  Trig:71.0 (10/04/2009), 88 (02/24/2008)  Problem # 2:  GERD (ICD-530.81) not taking Her updated medication list for this problem includes:    Prilosec 20 Mg Cpdr (Omeprazole) ..... One by mouth daily  Labs Reviewed: Hgb: 13.8 (10/04/2009)   Hct: 40.1 (10/04/2009)  Problem # 3:  OSTEOPOROSIS (ICD-733.00) one d Discussed medication use, applications of heat or ice, and exercises.   Problem # 4:  CARPAL TUNNEL SYNDROME, RIGHT (ICD-354.0) comprtede surgery  Complete Medication List: 1)  Adult Aspirin Ec Low Strength 81 Mg Tbec (Aspirin) .... Once daily 2)  Bl Vitamin E 400 Unit Caps (Vitamin e) .... Once daily 3)  Mobic 15 Mg Tabs (Meloxicam) .Marland Kitchen.. 1 once daily 4)  Calcium 600 1500 Mg Tabs (Calcium carbonate) .... Two once daily 5)  Calcium-magnesium-zinc 1000-400-15 Mg Tabs (Calcium-magnesium-zinc) .... 3 once daily 6)  Vitamin C 1000 Mg Tabs (Ascorbic acid) .... Once daily 7)  Womens Multivitamin Plus Tabs (Multiple vitamins-minerals) .... Once daily 8)  Fish Oil 1000 Mg Caps (Omega-3 fatty acids) .... 4 once daily 9)  Vitamin D-1000 Max St [phone] Mg-unit Tabs (Calcium-vitamin d) .Marland Kitchen.. 1` once daily 10)  Nasonex 50 Mcg/act Susp (Mometasone furoate) .... Two sprays q nare daily 11)  Zovirax 5 % Oint (Acyclovir) .... Apply to site as needed up to 5 times a day 12)  Prilosec 20 Mg Cpdr (Omeprazole) .... One by mouth daily 13)  Vitamin B-6 Cr 200 Mg Cr-tabs (Pyridoxine hcl) .Marland Kitchen.. 1 once daily  Patient Instructions: 1)  Please schedule a follow-up appointment in 3 months. Prescriptions: NASONEX 50 MCG/ACT SUSP (MOMETASONE FUROATE) two sprays q nare daily  #1 x 11   Entered  and Authorized by:   Stacie Glaze MD   Signed by:   Stacie Glaze MD on 10/11/2009   Method used:   Electronically to        [facility]* (retail)       [address]       [place], [place]  [id number]       Ph: [phone]       Fax: [phone]   RxID:   [id number]

## 2010-07-04 NOTE — Assessment & Plan Note (Signed)
Summary: 3 MONTH ROV/NJR/pt rescd from bump//ccm   Vital Signs:  Patient profile:   77 year old female Height:      65 inches Weight:      150 pounds BMI:     25.05 Temp:     98.2 degrees F oral Pulse rate:   72 / minute Resp:     14 per minute BP sitting:   122 / 74  (left arm)  Vitals Entered By: Willy Eddy, LPN (July 05, 2009 12:03 PM) CC: roa- couldnt tolerate tramadol- caused n&v   CC:  roa- couldnt tolerate tramadol- caused n&v.  Preventive Screening-Counseling & Management  Alcohol-Tobacco     Smoking Status: never     Passive Smoke Exposure: no  Problems Prior to Update: 1)  Carpal Tunnel Syndrome, Right  (ICD-354.0) 2)  Gerd  (ICD-530.81) 3)  Acute Maxillary Sinusitis  (ICD-461.0) 4)  Fever Blister  (ICD-054.9) 5)  Dysfunction of Eustachian Tube  (ICD-381.81) 6)  Uns Advrs Eff Uns Rx Medicinal&biological Sbstnc  (ICD-995.20) 7)  Cellulitis and Abscess of Face  (ICD-682.0) 8)  Basal Cell Carcinoma, Nose  (ICD-173.3) 9)  Bundle Branch Block  (ICD-426.50) 10)  Preventive Health Care  (ICD-V70.0) 11)  Osteoarthrosis, Local Nos, Lower Leg  (ICD-715.36) 12)  Hyperlipidemia  (ICD-272.4) 13)  Family History Diabetes 1st Degree Relative  (ICD-V18.0) 14)  Family History Breast Cancer 1st Degree Relative <50  (ICD-V16.3) 15)  Osteoporosis  (ICD-733.00)  Medications Prior to Update: 1)  Adult Aspirin Ec Low Strength 81 Mg  Tbec (Aspirin) .... Once Daily 2)  Boniva 150 Mg  Tabs (Ibandronate Sodium) .... Every Month 3)  Bl Vitamin E 400 Unit  Caps (Vitamin E) .... Once Daily 4)  Mobic 15 Mg Tabs (Meloxicam) .Marland Kitchen.. 1 Once Daily 5)  Calcium 600 1500 Mg  Tabs (Calcium Carbonate) .... Two Once Daily 6)  Calcium-Magnesium-Zinc 1000-400-15 Mg  Tabs (Calcium-Magnesium-Zinc) .... 3 Once Daily 7)  Vitamin C 1000 Mg  Tabs (Ascorbic Acid) .... Once Daily 8)  Womens Multivitamin Plus   Tabs (Multiple Vitamins-Minerals) .... Once Daily 9)  Fish Oil 1000 Mg  Caps (Omega-3  Fatty Acids) .... 4 Once Daily 10)  Vitamin D-1000 Max St [phone] Mg-Unit  Tabs (Calcium-Vitamin D) .Marland Kitchen.. 1` Once Daily 11)  Nasonex 50 Mcg/act Susp (Mometasone Furoate) .... Two Sprays Q Nare Daily 12)  Zovirax 5 % Oint (Acyclovir) .... Apply To Site As Needed Up To 5 Times A Day 13)  Astepro 0.15 % Soln (Azelastine Hcl) .... Two Sprays Q Nare Daily 14)  Prilosec 20 Mg Cpdr (Omeprazole) .... One By Mouth Daily  Current Medications (verified): 1)  Adult Aspirin Ec Low Strength 81 Mg  Tbec (Aspirin) .... Once Daily 2)  Bl Vitamin E 400 Unit  Caps (Vitamin E) .... Once Daily 3)  Mobic 15 Mg Tabs (Meloxicam) .Marland Kitchen.. 1 Once Daily 4)  Calcium 600 1500 Mg  Tabs (Calcium Carbonate) .... Two Once Daily 5)  Calcium-Magnesium-Zinc 1000-400-15 Mg  Tabs (Calcium-Magnesium-Zinc) .... 3 Once Daily 6)  Vitamin C 1000 Mg  Tabs (Ascorbic Acid) .... Once Daily 7)  Womens Multivitamin Plus   Tabs (Multiple Vitamins-Minerals) .... Once Daily 8)  Fish Oil 1000 Mg  Caps (Omega-3 Fatty Acids) .... 4 Once Daily 9)  Vitamin D-1000 Max St [phone] Mg-Unit  Tabs (Calcium-Vitamin D) .Marland Kitchen.. 1` Once Daily 10)  Nasonex 50 Mcg/act Susp (Mometasone Furoate) .... Two Sprays Q Nare Daily 11)  Zovirax 5 % Oint (Acyclovir) .Marland KitchenMarland KitchenMarland Kitchen  Apply To Site As Needed Up To 5 Times A Day 12)  Astepro 0.15 % Soln (Azelastine Hcl) .... Two Sprays Q Nare Daily 13)  Prilosec 20 Mg Cpdr (Omeprazole) .... One By Mouth Daily  Allergies: 1)  ! * Aleve 2)  ! Relafen 3)  ! Tramadol Hcl (Tramadol Hcl) 4)  * Voltarin Gel  Past History:  Family History: Last updated: 02/15/2007 Family History Breast cancer 1st degree relative <50 Family History Diabetes 1st degree relative Family History of Stroke M 1st degree relative <50 Family History of Neurological disorder  Social History: Last updated: 02/15/2007 Occupation: Married Former Smoker Alcohol use-yes Drug use-no  Risk Factors: Smoking Status: never (07/05/2009) Passive Smoke Exposure: no  (07/05/2009)  Past medical, surgical, family and social histories (including risk factors) reviewed, and no changes noted (except as noted below).  Past Medical History: Reviewed history from 09/14/2008 and no changes required. ( updated 09/14/2008) CARPAL TUNNEL SYNDROME, RIGHT (ICD-354.0) GERD (ICD-530.81) ACUTE MAXILLARY SINUSITIS (ICD-461.0) UNS ADVRS EFF UNS RX MEDICINAL&BIOLOGICAL SBSTNC (ICD-995.20) BASAL CELL CARCINOMA, NOSE (ICD-173.3) BUNDLE BRANCH BLOCK (ICD-426.50) OSTEOARTHROSIS, LOCAL NOS, LOWER LEG (ICD-715.36) HYPERLIPIDEMIA (ICD-272.4) FAMILY HISTORY DIABETES 1ST DEGREE RELATIVE (ICD-V18.0) FAMILY HISTORY BREAST CANCER 1ST DEGREE RELATIVE <50 (ICD-V16.3) OSTEOPOROSIS (ICD-733.00)  Past Surgical History: Reviewed history from 02/18/2007 and no changes required. Arthroscopic R Knee Flexible Sigmoidoscopy Total hip replacement bilateral  Family History: Reviewed history from 02/15/2007 and no changes required. Family History Breast cancer 1st degree relative <50 Family History Diabetes 1st degree relative Family History of Stroke M 1st degree relative <50 Family History of Neurological disorder  Social History: Reviewed history from 02/15/2007 and no changes required. Occupation: Married Former Smoker Alcohol use-yes Drug use-no  Review of Systems  The patient denies anorexia, fever, weight loss, weight gain, vision loss, decreased hearing, hoarseness, chest pain, syncope, dyspnea on exertion, peripheral edema, prolonged cough, headaches, hemoptysis, abdominal pain, melena, hematochezia, severe indigestion/heartburn, hematuria, incontinence, genital sores, muscle weakness, suspicious skin lesions, transient blindness, difficulty walking, depression, unusual weight change, abnormal bleeding, enlarged lymph nodes, angioedema, and breast masses.    Physical Exam  General:  patient is alert and in no distress Head:  normocephalic and atraumatic.   Eyes:   pupils equal and pupils round.   Ears:  R ear normal and L ear normal.   Nose:  no external deformity and no nasal discharge.   Mouth:  pharynx pink and moist and no erythema.   Neck:  No deformities, masses, or tenderness noted. Lungs:  clear to auscultation throughout with symmetric breath sounds. Heart:  regular rhythm and rate Abdomen:  soft, non-tender, normal bowel sounds, no distention, and no masses.   Msk:  no joint warmth, no redness over joints, joint swelling, and enlarged MCP joints.   Neurologic:  alert & oriented X3 and DTRs symmetrical and normal.     Impression & Recommendations:  Problem # 1:  HYPERLIPIDEMIA (ICD-272.4)  Labs Reviewed: SGOT: 31 (03/30/2009)   SGPT: 27 (03/30/2009)  Lipid Goals: Chol Goal: 200 (02/24/2008)   HDL Goal: 40 (02/24/2008)   LDL Goal: 160 (02/24/2008)   TG Goal: 150 (02/24/2008)  Prior 10 Yr Risk Heart Disease: Not enough information (02/24/2008)   HDL:92.80 (03/30/2009), 106.3 (02/24/2008)  LDL:DEL (02/24/2008), DEL (02/11/2007)  Chol:229 (03/30/2009), 271 (02/24/2008)  Trig:88 (02/24/2008), 83 (02/11/2007)  Problem # 2:  GERD (ICD-530.81)  Her updated medication list for this problem includes:    Prilosec 20 Mg Cpdr (Omeprazole) ..... One by mouth daily  Labs Reviewed:  Hgb: 13.7 (03/30/2009)   Hct: 41.0 (03/30/2009)  Problem # 3:  OSTEOPOROSIS (ICD-733.00) Assessment: Unchanged discussion of stopping medications, bone density in april The following medications were removed from the medication list:    Boniva 150 Mg Tabs (Ibandronate sodium) ..... Every month  Discussed medication use, applications of heat or ice, and exercises.   Problem # 4:  ALLERGIC RHINITIS CAUSE UNSPECIFIED (ICD-477.9) Assessment: Unchanged using saline off an on Her updated medication list for this problem includes:    Nasonex 50 Mcg/act Susp (Mometasone furoate) .Marland Kitchen..Marland Kitchen Two sprays q nare daily    Astepro 0.15 % Soln (Azelastine hcl) .Marland Kitchen..Marland Kitchen Two sprays q  nare daily  Discussed use of allergy medications and environmental measures.   Problem # 5:  OSTEOARTHROSIS, LOCAL NOS, LOWER LEG (ICD-715.36) Assessment: Unchanged  Her updated medication list for this problem includes:    Adult Aspirin Ec Low Strength 81 Mg Tbec (Aspirin) ..... Once daily    Mobic 15 Mg Tabs (Meloxicam) .Marland Kitchen... 1 once daily  Discussed use of medications, application of heat or cold, and exercises.   Problem # 6:  CARPAL TUNNEL SYNDROME, RIGHT (ICD-354.0) Assessment: Unchanged Informed consen obtained and then the joint was prepped in a sterile manor and 40 mg depo and 1/2 cc 1% lidocaine injected into the synovial space. After care discussed. Pt tolerated procedure well.  Labs Reviewed: TSH: 1.86 (03/30/2009)     Orders: Depo-Medrol 20mg  (J1020) Injection, Tendon / Ligament (46962)  Complete Medication List: 1)  Adult Aspirin Ec Low Strength 81 Mg Tbec (Aspirin) .... Once daily 2)  Bl Vitamin E 400 Unit Caps (Vitamin e) .... Once daily 3)  Mobic 15 Mg Tabs (Meloxicam) .Marland Kitchen.. 1 once daily 4)  Calcium 600 1500 Mg Tabs (Calcium carbonate) .... Two once daily 5)  Calcium-magnesium-zinc 1000-400-15 Mg Tabs (Calcium-magnesium-zinc) .... 3 once daily 6)  Vitamin C 1000 Mg Tabs (Ascorbic acid) .... Once daily 7)  Womens Multivitamin Plus Tabs (Multiple vitamins-minerals) .... Once daily 8)  Fish Oil 1000 Mg Caps (Omega-3 fatty acids) .... 4 once daily 9)  Vitamin D-1000 Max St [phone] Mg-unit Tabs (Calcium-vitamin d) .Marland Kitchen.. 1` once daily 10)  Nasonex 50 Mcg/act Susp (Mometasone furoate) .... Two sprays q nare daily 11)  Zovirax 5 % Oint (Acyclovir) .... Apply to site as needed up to 5 times a day 12)  Astepro 0.15 % Soln (Azelastine hcl) .... Two sprays q nare daily 13)  Prilosec 20 Mg Cpdr (Omeprazole) .... One by mouth daily  Patient Instructions: 1)  ultrasonic cold steam humdifier to use in bedroom at night 2)  Please schedule a follow-up appointment in 3  months. 3)  Hepatic Panel prior to visit, ICD-9:995.20 4)  Lipid Panel prior to visit, ICD-9:272.4 5)  TSH prior to visit, ICD-9:272.4 6)  CBC w/ Diff prior to visit, ICD-9:995.20 7)  calcium 733.00 8)  vit d 733.00

## 2010-07-04 NOTE — Assessment & Plan Note (Signed)
Summary: 4 month rov/njr   Vital Signs:  Patient profile:   77 year old female Height:      65 inches Weight:      156 pounds BMI:     26.05 Temp:     98.2 degrees F oral Pulse rate:   72 / minute Resp:     14 per minute BP sitting:   136 / 80  (left arm)  Vitals Entered By: Willy Eddy, LPN (May 16, 2010 9:37 AM) CC: roa labs Is Patient Diabetic? No   Primary Care Provider:  Stacie Glaze MD  CC:  roa labs.  History of Present Illness: the pt was diagnosed with glacoma and put on latanoprost drops... initially she had a problem with using the drops... 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) 3)  Osteopenia  (ICD-733.90) 4)  Allergic Rhinitis Cause Unspecified  (ICD-477.9) 5)  Carpal Tunnel Syndrome, Right  (ICD-354.0) 6)  Gerd  (ICD-530.81) 7)  Acute Maxillary Sinusitis  (ICD-461.0) 8)  Fever Blister  (ICD-054.9) 9)  Dysfunction of Eustachian Tube  (ICD-381.81) 10)  Uns Advrs Eff Uns Rx Medicinal&biological Sbstnc  (ICD-995.20) 11)  Cellulitis and Abscess of Face  (ICD-682.0) 12)  Basal Cell Carcinoma, Nose  (ICD-173.3) 13)  Bundle Branch Block  (ICD-426.50) 14)  Preventive Health Care  (ICD-V70.0) 15)  Osteoarthrosis, Local Nos, Lower Leg  (ICD-715.36) 16)  Hyperlipidemia  (ICD-272.4) 17)  Family History Diabetes 1st Degree Relative  (ICD-V18.0) 18)  Family History Breast Cancer 1st Degree Relative <50  (ICD-V16.3) 19)  Osteoporosis  (ICD-733.00)  Current Problems (verified): 1)  Bee Sting Reaction, Local   (ICD-989.5) 2)  Osteopenia  (ICD-733.90) 3)  Allergic Rhinitis Cause Unspecified  (ICD-477.9) 4)  Carpal Tunnel Syndrome, Right  (ICD-354.0) 5)  Gerd  (ICD-530.81) 6)  Acute Maxillary Sinusitis  (ICD-461.0) 7)  Fever Blister  (ICD-054.9) 8)  Dysfunction of Eustachian Tube  (ICD-381.81) 9)  Uns Advrs Eff Uns Rx Medicinal&biological Sbstnc  (ICD-995.20) 10)  Cellulitis and Abscess of Face  (ICD-682.0) 11)  Basal Cell Carcinoma, Nose  (ICD-173.3) 12)  Bundle Branch Block  (ICD-426.50) 13)  Preventive Health Care  (ICD-V70.0) 14)  Osteoarthrosis, Local Nos, Lower Leg  (ICD-715.36) 15)  Hyperlipidemia  (ICD-272.4) 16)  Family History Diabetes 1st Degree Relative  (ICD-V18.0) 17)  Family History Breast Cancer 1st Degree Relative <50  (ICD-V16.3) 18)  Osteoporosis  (ICD-733.00)  Medications Prior to Update: 1)  Adult Aspirin Ec Low Strength 81 Mg  Tbec (Aspirin) .... Once Daily 2)  Bl Vitamin E 400 Unit  Caps (Vitamin E) .... Once Daily 3)  Mobic 15 Mg Tabs (Meloxicam) .Marland Kitchen.. 1 Once Daily 4)  Calcium 600 1500 Mg  Tabs (Calcium Carbonate) .... Two Once Daily 5)  Calcium-Magnesium-Zinc 1000-400-15 Mg  Tabs (Calcium-Magnesium-Zinc) .... 3 Once Daily 6)  Vitamin C 1000 Mg  Tabs (Ascorbic Acid) .... Once Daily 7)  Womens Multivitamin Plus   Tabs (Multiple Vitamins-Minerals) .... Once Daily 8)  Fish Oil 1000 Mg  Caps (Omega-3 Fatty Acids) .... 4 Once Daily 9)  Vitamin D-1000 Max St [phone] Mg-Unit  Tabs (Calcium-Vitamin D) .Marland Kitchen.. 1` Once Daily 10)  Nasonex 50 Mcg/act Susp (Mometasone Furoate) .... Two Sprays Q Nare Daily 11)  Zovirax 5 % Oint (Acyclovir) .... Apply To Site As Needed Up To 5 Times A Day 12)  Prilosec 20 Mg Cpdr (Omeprazole) .... One By Mouth Daily 13)  Vitamin B-6 Cr 200 Mg Cr-Tabs (Pyridoxine Hcl) .Marland Kitchen.. 1 Once Daily  Current Medications (verified): 1)  Adult Aspirin Ec Low Strength 81 Mg  Tbec (Aspirin) .... Once Daily 2)  Bl Vitamin E 400 Unit  Caps (Vitamin E) .... Once  Daily 3)  Mobic 15 Mg Tabs (Meloxicam) .Marland Kitchen.. 1 Once Daily 4)  Calcium 600 1500 Mg  Tabs (Calcium Carbonate) .... Two Once Daily 5)  Calcium-Magnesium-Zinc 1000-400-15 Mg  Tabs (Calcium-Magnesium-Zinc) .... 3 Once Daily 6)  Vitamin C 1000 Mg  Tabs (Ascorbic Acid) .... Once Daily 7)  Womens Multivitamin Plus   Tabs (Multiple Vitamins-Minerals) .... Once Daily 8)  Fish Oil 1000 Mg  Caps (Omega-3 Fatty Acids) .... 4 Once Daily 9)  Vitamin D-1000 Max St [phone] Mg-Unit  Tabs (Calcium-Vitamin D) .Marland Kitchen.. 1` Once Daily 10)  Nasonex 50 Mcg/act Susp (Mometasone Furoate) .... Two Sprays Q Nare Daily 11)  Zovirax 5 % Oint (Acyclovir) .... Apply To Site As Needed Up To 5 Times A Day 12)  Prilosec 20 Mg Cpdr (Omeprazole) .... One By Mouth Daily 13)  Vitamin B-6 Cr 200 Mg Cr-Tabs (Pyridoxine Hcl) .Marland Kitchen.. 1 Once Daily 14)  Xalatan 0.005 % Soln (Latanoprost) .Marland Kitchen.. 1 Drop Ou At Bedtime  Allergies (verified): 1)  ! * Aleve 2)  ! Relafen 3)  ! Tramadol Hcl (Tramadol Hcl) 4)  * Voltarin Gel  Past History:  Family History: Last updated: 02/15/2007 Family History Breast cancer 1st degree relative <50 Family History Diabetes 1st degree relative Family History of Stroke M 1st degree relative <50 Family History of Neurological disorder  Social History: Last updated: 02/15/2007 Occupation: Married Former Smoker Alcohol use-yes Drug use-no  Risk Factors: Smoking Status: never (05/16/2010) Passive Smoke Exposure: no (05/16/2010)  Past medical, surgical, family and social histories (including risk factors) reviewed, and no changes noted (except as noted below).  Past Medical History: Reviewed history from 09/14/2008 and no changes required. ( updated 09/14/2008) CARPAL TUNNEL SYNDROME, RIGHT (ICD-354.0) GERD (ICD-530.81) ACUTE MAXILLARY SINUSITIS (ICD-461.0) UNS ADVRS EFF UNS RX MEDICINAL&BIOLOGICAL SBSTNC (ICD-995.20) BASAL CELL CARCINOMA, NOSE (ICD-173.3) BUNDLE BRANCH BLOCK  (ICD-426.50) OSTEOARTHROSIS, LOCAL NOS, LOWER LEG (ICD-715.36) HYPERLIPIDEMIA (ICD-272.4) FAMILY HISTORY DIABETES 1ST DEGREE RELATIVE (ICD-V18.0) FAMILY HISTORY BREAST CANCER 1ST DEGREE RELATIVE <50 (ICD-V16.3) OSTEOPOROSIS (ICD-733.00)  Past Surgical History: Reviewed history from 02/18/2007 and no changes required. Arthroscopic R Knee Flexible Sigmoidoscopy Total hip replacement bilateral  Family History: Reviewed history from 02/15/2007 and no changes required. Family History Breast cancer 1st degree relative <50 Family History Diabetes 1st degree relative Family History of Stroke M 1st degree relative <50 Family History of Neurological disorder  Social History: Reviewed history from 02/15/2007 and no changes required. Occupation: Married Former Smoker Alcohol use-yes Drug use-no  Review of Systems  The patient denies anorexia, fever, weight loss, weight gain, vision loss, decreased hearing, hoarseness, chest pain, syncope, dyspnea on exertion, peripheral edema, prolonged cough, headaches, hemoptysis, abdominal pain, melena, hematochezia, severe indigestion/heartburn, hematuria, incontinence, genital sores, muscle weakness, suspicious skin lesions, transient blindness, difficulty walking, depression, unusual weight change, abnormal bleeding, enlarged lymph nodes, angioedema, and  breast masses.    Physical Exam  General:  patient is alert and in no distress Head:  normocephalic and atraumatic.   Eyes:  pupils equal and pupils round.   Ears:  R ear normal and L ear normal.   Nose:  no external deformity and no nasal discharge.   Mouth:  pharynx pink and moist and no erythema.   Neck:  No deformities, masses, or tenderness noted. Lungs:  clear to auscultation throughout with symmetric breath sounds. Heart:  regular rhythm and rate Abdomen:  soft, non-tender, normal bowel sounds, no distention, and no masses.     Impression & Recommendations:  Problem # 1:  GLAUCOMA  STAGE UNSPECIFIED (ICD-365.70) Assessment New treated  Problem # 2:  OSTEOARTHROSIS, LOCAL NOS, LOWER LEG (ICD-715.36) Assessment: Unchanged  Her updated medication list for this problem includes:    Adult Aspirin Ec Low Strength 81 Mg Tbec (Aspirin) ..... Once daily    Mobic 15 Mg Tabs (Meloxicam) .Marland Kitchen... 1 once daily  Discussed use of medications, application of heat or cold, and exercises.   Problem # 3:  HYPERLIPIDEMIA (ICD-272.4) Assessment: Unchanged The pt was asked about all immunizations, health maint. services that are appropriate to their age and was given guidance on diet exercize  and weight management  Labs Reviewed: SGOT: 29 (05/09/2010)   SGPT: 23 (05/09/2010)  Lipid Goals: Chol Goal: 200 (02/24/2008)   HDL Goal: 40 (02/24/2008)   LDL Goal: 160 (02/24/2008)   TG Goal: 150 (02/24/2008)  Prior 10 Yr Risk Heart Disease: Not enough information (02/24/2008)   HDL:98.60 (05/09/2010), 113.20 (10/04/2009)  LDL:DEL (02/24/2008), DEL (02/11/2007)  Chol:240 (05/09/2010), 255 (10/04/2009)  Trig:63.0 (05/09/2010), 71.0 (10/04/2009)  Problem # 4:  UNS ADVRS EFF UNS RX MEDICINAL&BIOLOGICAL SBSTNC (ICD-995.20) monitering liver and renal    normals  Complete Medication List: 1)  Adult Aspirin Ec Low Strength 81 Mg Tbec (Aspirin) .... Once daily 2)  Bl Vitamin E 400 Unit Caps (Vitamin e) .... Once daily 3)  Mobic 15 Mg Tabs (Meloxicam) .Marland Kitchen.. 1 once daily 4)  Calcium 600 1500 Mg Tabs (Calcium carbonate) .... Two once daily 5)  Calcium-magnesium-zinc 1000-400-15 Mg Tabs (Calcium-magnesium-zinc) .... 3 once daily 6)  Vitamin C 1000 Mg Tabs (Ascorbic acid) .... Once daily 7)  Womens Multivitamin Plus Tabs (Multiple vitamins-minerals) .... Once daily 8)  Fish Oil 1000 Mg Caps (Omega-3 fatty acids) .... 4 once daily 9)  Vitamin D-1000 Max St [phone] Mg-unit Tabs (Calcium-vitamin d) .Marland Kitchen.. 1` once daily 10)  Nasonex 50 Mcg/act Susp (Mometasone furoate) .... Two sprays q nare daily 11)   Zovirax 5 % Oint (Acyclovir) .... Apply to site as needed up to 5 times a day 12)  Prilosec 20 Mg Cpdr (Omeprazole) .... One by mouth daily 13)  Vitamin B-6 Cr 200 Mg Cr-tabs (Pyridoxine hcl) .Marland Kitchen.. 1 once daily 14)  Xalatan 0.005 % Soln (Latanoprost) .Marland Kitchen.. 1 drop ou at bedtime  Patient Instructions: 1)  Please schedule a follow-up appointment in 4 months. 2)  It is important that you exercise regularly at least 20 minutes ( one mile)  5 times a week. If you develop chest pain, have severe difficulty breathing, or feel very tired , stop exercising immediately and seek medical attention.   Orders Added: 1)  Est. Patient Level IV [78295]

## 2010-09-11 ENCOUNTER — Encounter: Payer: Self-pay | Admitting: Internal Medicine

## 2010-09-17 ENCOUNTER — Encounter: Payer: Self-pay | Admitting: Internal Medicine

## 2010-09-17 ENCOUNTER — Other Ambulatory Visit: Payer: Self-pay | Admitting: *Deleted

## 2010-09-17 MED ORDER — MELOXICAM 15 MG PO TABS: 15.0000 mg | ORAL_TABLET | Freq: Every day | ORAL | Status: DC

## 2010-09-19 ENCOUNTER — Ambulatory Visit (INDEPENDENT_AMBULATORY_CARE_PROVIDER_SITE_OTHER): Payer: Medicare Other | Admitting: Internal Medicine

## 2010-09-19 ENCOUNTER — Encounter: Payer: Self-pay | Admitting: Internal Medicine

## 2010-09-19 VITALS — BP 144/82 | HR 76 | Temp 98.0°F | Resp 16 | Ht 65.0 in | Wt 152.0 lb

## 2010-09-19 DIAGNOSIS — R030 Elevated blood-pressure reading, without diagnosis of hypertension: Secondary | ICD-10-CM

## 2010-09-19 DIAGNOSIS — M1710 Unilateral primary osteoarthritis, unspecified knee: Secondary | ICD-10-CM

## 2010-09-19 DIAGNOSIS — E785 Hyperlipidemia, unspecified: Secondary | ICD-10-CM

## 2010-09-19 NOTE — Assessment & Plan Note (Signed)
Knee pain and DJD of knee seeing allusio Had injecton in the right knee in March 2012

## 2010-09-19 NOTE — Patient Instructions (Signed)
Keep a close eye on your blood pressure is slightly elevated today if it is persistently elevated over 140 we will need to do. Watch salt consumption and moderate alcohol.

## 2010-09-19 NOTE — Progress Notes (Signed)
Subjective:    Patient ID: Leah [name], female    DOB: 11/22/1929, 77 y.o.   MRN: [id number]  HPI Patient is a 77 year old white female who presents for followup of multiple medical problems including osteoarthritis primarily of her knees.  She has seen her orthopedist and had an injection in her right knee March 30 she is trying to delay replacement of the joint if possible she is still able to exercise on regular basis and agree with his plan at this time however we have informed her if she loses the ability to exercise and has mobility problems I would be a time when the replacement would be to begin done sooner than later. The pressure was slightly elevated today we have not made a diagnosis of hypertension will monitor blood pressure carefully she will keep an eye on her blood pressure was persistently elevated greater than 140 we'll need to consider medication at this time she has minimal cardiovascular risk factors in that she has an HDL almost of 100 and she has never had blood pressure as diagnosed before   Review of Systems  Constitutional: Negative for activity change, appetite change and fatigue.  HENT: Negative for ear pain, congestion, neck pain, postnasal drip and sinus pressure.   Eyes: Negative for redness and visual disturbance.  Respiratory: Negative for cough, shortness of breath and wheezing.   Gastrointestinal: Negative for abdominal pain and abdominal distention.  Genitourinary: Negative for dysuria, frequency and menstrual problem.  Musculoskeletal: Negative for myalgias, joint swelling and arthralgias.  Skin: Negative for rash and wound.  Neurological: Negative for dizziness, weakness and headaches.  Hematological: Negative for adenopathy. Does not bruise/bleed easily.  Psychiatric/Behavioral: Negative for sleep disturbance and decreased concentration.       Past Medical History  Diagnosis Date  . Carpal tunnel syndrome   . GERD (gastroesophageal reflux  disease)   . Acute maxillary sinusitis   . Unspecified adverse effect of unspecified drug, medicinal and biological substance   . Basal cell carcinoma of nose   . Bundle branch block, unspecified   . Localized osteoarthrosis not specified whether primary or secondary, lower leg   . Hyperlipidemia   . Family history of malignant neoplasm of breast   . Family history of diabetes mellitus   . Osteoporosis    Past Surgical History  Procedure Date  . Arthroscopic r knee   . Flexible sigmoidoscopy   . Joint replacement   . Total hip rerplacement bilateral     reports that she has quit smoking. She does not have any smokeless tobacco history on file. She reports that she drinks alcohol. She reports that she does not use illicit drugs. family history includes Breast cancer in an unspecified family member; Cancer in her mother; Heart disease in her father; and Stroke in her father. Allergies  Allergen Reactions  . Nabumetone   . Naproxen Sodium   . Tramadol Hcl     REACTION: caused nausea and vomiting    Objective:   Physical Exam  Constitutional: She is oriented to person, place, and time. She appears well-developed and well-nourished. No distress.  HENT:  Head: Normocephalic and atraumatic.  Right Ear: External ear normal.  Left Ear: External ear normal.  Nose: Nose normal.  Mouth/Throat: Oropharynx is clear and moist.  Eyes: Conjunctivae and EOM are normal. Pupils are equal, round, and reactive to light.  Neck: Normal range of motion. Neck supple. No JVD present. No tracheal deviation present. No thyromegaly present.  Cardiovascular: Normal  rate, regular rhythm, normal heart sounds and intact distal pulses.   No murmur heard. Pulmonary/Chest: Effort normal and breath sounds normal. She has no wheezes. She exhibits no tenderness.  Abdominal: Soft. Bowel sounds are normal.  Musculoskeletal: Normal range of motion. She exhibits no edema and no tenderness.  Lymphadenopathy:    She  has no cervical adenopathy.  Neurological: She is alert and oriented to person, place, and time. She has normal reflexes. No cranial nerve deficit.  Skin: Skin is warm and dry. She is not diaphoretic.  Psychiatric: She has a normal mood and affect. Her behavior is normal.          Assessment & Plan:

## 2010-09-19 NOTE — Assessment & Plan Note (Signed)
HDL of 100 Will not treat LDL elevation

## 2010-10-15 NOTE — Op Note (Signed)
NAME:  Leah [name], Leah [name]             ACCOUNT NO.:  [id number]   MEDICAL RECORD NO.:  [id number]          PATIENT TYPE:  AMB   LOCATION:  [facility]                         FACILITY:  [facility]   PHYSICIAN:  Daniel L. Gottsegen, M.D.DATE OF BIRTH:  1929/06/16   DATE OF PROCEDURE:  03/18/2007  DATE OF DISCHARGE:                               OPERATIVE REPORT   PREOPERATIVE DIAGNOSES:  1. Postmenopausal bleeding.  2. Endometrial polyp.   POSTOPERATIVE DIAGNOSES:  1. Postmenopausal bleeding.  2. Endometrial polyp.   OPERATION:  1. Hysteroscopy.  2. Excision of endometrial polyp.   SURGEON:  Daniel L. Eda Paschal, M.D.   ANESTHESIA:  General.   INDICATIONS:  The patient is a 77 year old female who had presented to  the office last week with a history of postmenopausal bleeding.  She is  not on hormonal replacement therapy.  Ultrasound was done that revealed  a large endometrial cavity with a distinct 1-cm polyp at the top of the  fundus.  As a result of the above, she enters the hospital now for  hysteroscopy, excision of the polyp for control of the bleeding as well  as a histological diagnosis.   FINDINGS:  External is normal.  BUS is normal.  Vaginal is normal.  Cervix is clean.  Uterus is retroverted, normal size and shape.  Adnexa  failed to reveal any masses.  At the time of hysteroscopy there was a  small perforation that had probably been sustained during the dilatation  process at the top of the fundus.  It was a small perforation.  It was  in the midline.  It was not bleeding.  There was an endometrial polyp  significantly below the perforation on the anterior fundal wall of about  1 cm.  Other than the above, there were no abnormal findings.   PROCEDURE:  After adequate general anesthesia, the patient was placed in  the dorsal lithotomy position and prepped and draped in the usual  sterile manner.  A single-tooth tenaculum was placed on the anterior lip  of the cervix.  The  patient's cervix was dilated easily without any  resistance to a #31 Pratt dilator.  At no time did the surgeon feel a  perforation had occurred; however, when the hysteroscopic  resectascope  was inserted  a small perforation at the top of the fundus was  immediately noticed.  Sorbitol 3% was used to expand the intrauterine  cavity and a camera had been used for magnification.  A 90-degree wire  loop with appropriate settings had been set up prior to the procedure.  Keeping the perforation clearly in mind, making sure that there was no  adjacent bowel, the polyp that was seen could be resected without  difficulty and without any bleeding.  It was removed and sent to  pathology for tissue diagnosis.  Because this perforation had been  noticed, her fluid deficit was monitored on a second-by-second basis.  She did have a fluid deficit consistent with the above; however, it got  only as high as 515 mL by the end of the procedure and so it was felt  safe to do the procedure and remove the polyp.  At the termination  of the procedure, there was no bleeding noted.  Once again the  perforation appeared clean and appeared that it would close immediately  without any bleeding, and fluid deficit was only 515 mL.  The patient  will be watched extremely carefully in the recovery room.  The patient  left the operating room in satisfactory condition.      Daniel L. Eda Paschal, M.D.  Electronically Signed     DLG/MEDQ  D:  03/18/2007  T:  03/19/2007  Job:  [id number]

## 2010-10-18 NOTE — Op Note (Signed)
NAME:  Leah [name], Leah [name]                       ACCOUNT NO.:  [id number]   MEDICAL RECORD NO.:  [id number]                   PATIENT TYPE:  AMB   LOCATION:  [facility]                                 [facility]ACILITY[facility]  [facility]   PHYSICIAN:  Daniel L. Eda Paschal, M.D.           DATE OF BIRTH:  [birth year]   DATE OF PROCEDURE:  07/21/2003  DATE OF DISCHARGE:                                 OPERATIVE REPORT   PREOPERATIVE DIAGNOSES:  1. Postmenopausal bleeding.  2. Endometrial polyp.   POSTOPERATIVE DIAGNOSES:  1. Postmenopausal bleeding.  2. Endometrial polyp.  3. Submucous myoma.   OPERATION/PROCEDURE:  Hysteroscopy with resection of endometrial polyp and  submucous myoma.   SURGEON:  Daniel L. Eda Paschal, M.D.   ANESTHESIA:  General.   INDICATIONS:  The patient is a 77 year old female who presented to the  office with postmenopausal bleeding.  Saline infusion histogram showed a  large, 3 cm lesion, most consistent with a polyp, plus a smaller 1.5 cm  lesion.  As a result of this, she comes to the operating room for resection  of the above.   FINDINGS:  External and vagina normal.  Cervix is clean.  Uterus is mid  position, normal size and shape.  Adnexa are palpably normal.  At the time  of hysteroscopy, the first thing encountered was a very large endometrial  polyp that appeared to be attached to the lower uterine segment posteriorly.  After this was resected, there was a second lesion which appeared to be more  consistent with a submucous myoma both in terms of appearance and  consistency when it was resected.  Once they both had been resected, the  patient had normal endometrial cavity and endocervical canal.   DESCRIPTION OF PROCEDURE:  After adequate general anesthesia, the patient  was placed in the dorsal lithotomy position, prepped and draped in the usual  sterile manner.  The single-tooth tenaculum was placed in the anterior lip  of the cervix.  The cervix was dilated with a #31  Pratt dilator.  Hysteroscopic examination was done with a hysteroscopic resectoscope.  Three  percent sorbitol was used to expand intrauterine cavity and a camera was  used for magnification.  The findings were as noted above.  Using a 90-  degree wire loupe, with setting of 70 coag, 110 cutting.  The intrauterine  pathology was resected.  The first part that was resected appeared to be  most consistent with a polyp.  The second appeared to be most consistent  with a submucous myoma.  Once again 3% sorbitol was used to expand the  intrauterine cavity.  Finally all the tissue had been removed.  It had to be  resected in about 20  pieces.  There was no bleeding noted.  Blood loss was less than 100 mL with  none replaced.  Fluid deficit was 200 mL.  Tissue was sent to pathology for  tissue diagnosis.  The patient tolerated  the procedure well and left the  operating room in satisfactory condition.                                               Daniel L. Eda Paschal, M.D.    Tonette Bihari  D:  07/21/2003  T:  07/21/2003  Job:  [id number]

## 2010-10-18 NOTE — H&P (Signed)
NAME:  Leah [name], Leah [name]                       ACCOUNT NO.:  [id number]   MEDICAL RECORD NO.:  [id number]                   PATIENT TYPE:  INP   LOCATION:  NA                                   FACILITY:  [facility]   PHYSICIAN:  Ollen Gross, M.D.                 DATE OF BIRTH:  07/29/1929   DATE OF ADMISSION:  01/23/2003  DATE OF DISCHARGE:                                HISTORY & PHYSICAL   CHIEF COMPLAINT:  Right hip pain.   HISTORY OF PRESENT ILLNESS:  The patient is a 77 year old female who has  been seen by Dr. [name] for progressive right hip pain.  She had previously  been seen by Dr. [name] and undergone a right knee arthroscopy.  She states  the right knee is doing fantastic.  Unfortunately, she has had progressive  right hip pain.  She is known to have right hip arthritis.  The pain has  been somewhat progressive now to the point where it is hurting all the time,  and even getting pain at night.  It is interfering with her daily  activities.  She has previously undergone a left total hip replacement and  did extremely well with that.  That was done by Dr. Fraser Din back in 1994.  The right hip is getting to a stage now where she would like to have  something done about it.  She is seen in the office.  X-rays show a  moderately advanced arthritis with joint space narrowing and marginal  osteophyte formation on the femoral head and neck.  Most recent films were  compared to prior films earlier this year which did show progressive  changes.  It is felt she would benefit from undergoing hip replacement.  Risks and benefits of the procedure have been discussed with the patient,  she elected to proceed with surgery.   ALLERGIES:  ALEVE.   CURRENT MEDICATIONS:  1. Vioxx.  2. Fosamax.   PAST MEDICAL HISTORY:  1. Cystitis.  2. Osteoarthritis.   PAST SURGICAL HISTORY:  1. Left total hip arthroplasty in 1994, per Dr. Fraser Din.  2. Right knee arthroscopy in September 2002, Dr.  [name].   SOCIAL HISTORY:  Married.  Engineer, agricultural.  Nonsmoker.  Only occasional  intake of alcohol.  Has two sons.  Husband will be assisting with her care  after surgery.  She lives in a three floor split level home.  She has four  steps going up and eight steps going down from the middle floor.   FAMILY HISTORY:  Father deceased with a history of heart disease.  Mother  deceased with arthritis and breast cancer.   REVIEW OF SYSTEMS:  GENERAL:  No fevers, chills, night sweats.  NEUROLOGIC:  No seizures, syncope, or paralysis.  RESPIRATORY:  No shortness of breath,  productive cough, or hemoptysis.  CARDIOVASCULAR:  No chest pain, angina, or  orthopnea.  GASTROINTESTINAL:  No  nausea, vomiting, diarrhea, constipation.  GENITOURINARY:  She does have a history of cystitis, no dysuria, hematuria,  or discharge.  MUSCULOSKELETAL:  Complaints pertinent to that found in the  history of present illness.   PHYSICAL EXAMINATION:  VITAL SIGNS:  Pulse 76, respirations 12, blood  pressure 138/84.  GENERAL:  The patient is a 77 year old white female well-developed, well-  nourished, appeared to be in no acute distress.  Short stature.  HEENT:  Normocephalic, atraumatic.  Pupils are round and reactive.  The  patient is noted to wear glasses.  Oropharynx is clear.  Small partial  denture.  NECK:  Supple.  CHEST:  Clear to auscultation anterior and posterior chest walls.  No  rhonchi, rubs, or wheezing.  HEART:  Regular rate and rhythm.  No murmurs.  S1 and S2 noted.  ABDOMEN:  Soft, nontender, bowel sounds present.  RECTAL:  Not done, not pertinent to present illness.  BREASTS:  Not done, not pertinent to present illness.  GENITALIA:  Not done, not pertinent to present illness.  EXTREMITIES:  Significant to that of the right hip.  She has flexion of  about 90 degrees.  There is no internal rotation, only 20 degrees of  external rotation, about 30 degrees of abduction.  She does have pain noted   on passive range of motion.   IMPRESSION:  1. Osteoarthritis, right hip.  2. Cystitis.   PLAN:  The patient will be admitted to [facility] to undergo a  right total hip arthroplasty.  Surgery will be performed by Dr. Ollen Gross.  Her medical doctor is Dr. [name].  Dr. Lovell Sheehan will be  notified of the room number on admission and be consulted if needed for any  medical assistance with this patient throughout the hospital course.      Alexzandrew L. Julien Girt, P.A.              Ollen Gross, M.D.    ALP/MEDQ  D:  01/12/2003  T:  01/13/2003  Job:  [id number]   cc:   Stacie Glaze, M.D. [facility]   Ollen Gross, M.D.  [address]  [place]  [place] [address]  Fax: [phone]

## 2010-10-18 NOTE — Op Note (Signed)
NAME:  Leah [name], Leah [name] NO.:  [id number]   MEDICAL RECORD NO.:  [id number]                   PATIENT TYPE:  INP   LOCATION:  X007                                 FACILITY:  [facility]   PHYSICIAN:  Ollen Gross, M.D.                 DATE OF BIRTH:  17-Dec-1929   DATE OF PROCEDURE:  01/23/2003  DATE OF DISCHARGE:                                 OPERATIVE REPORT   PREOPERATIVE DIAGNOSIS:  Osteoarthritis, right hip.   POSTOPERATIVE DIAGNOSIS:  Osteoarthritis, right hip.   OPERATION/PROCEDURE:  Right total hip arthroplasty.   SURGEON:  Ollen Gross, M.D.   ASSISTANT:  Alexzandrew L. Julien Girt, P.A.   ANESTHESIA:  Spinal.   ESTIMATED BLOOD LOSS:  400 mL.   DRAINS:  Hemovac x1.   COMPLICATIONS:  None.   CONDITION:  Stable to recovery room.   BRIEF CLINICAL NOTE:  The patient is a 77 year old female with end-stage  osteoarthritis of the right hip with pain refractory to nonoperative  management.  She presents now for right total hip arthroplasty.   DESCRIPTION OF PROCEDURE:  After the successful administration of general  anesthetic, the patient is placed in the left lateral decubitus position  with the right side up and held with the hip positioner.  Right lower  extremity is isolated from her perineum with plastic drapes and prepped and  draped in the usual sterile fashion.  Mini posterolateral incision is made  with a 10-blade through the subcutaneous tissue to the level of the fascia  lata which is incised in line with the skin incision.  Sciatic nerve is  palpated and protected and short rotators isolated off the femur.  The  capsulectomy is then performed,  the hip dislocated in the center of the  femoral head mark.  Trial prosthesis is placed such that the center of the  trial head corresponds to the center of her native femoral head.  Osteotomy  line is marked on the femoral neck and osteotomy is made with an oscillating  saw.  Femur is then  retracted anteriorly and acetabulum exposure obtained.   The acetabular reaming starts at a 45 and then went up to a 47.  The 47 had  good rim fit.  We placed the 48 mm Pinnacle acetabular shell in anatomic  position, transfixed with two dome screws.  Trial 28 mm  neutral liner is  placed.   The femur is prepared first with a canal finder and irrigation.  We broached  up to a size 1 and placed a size 2 high-offset neck ________ with the high-  offset, the abductors were too tight and we went to a standard offset with a  20 plus 1.5 head and reduced the head with outstanding stability with full  extension and full external rotation, 70 degrees flexion and 40 degrees  adduction, 90 degrees internal rotation and 90 degrees flexion, 70 degrees  internal  rotation.  The trials were then removed and the permanent apex hole  eliminator placed into the acetabular shell.  The permanent 28 mm neutral  Marathon liner was placed.  The trial cement restrictors, size 4 and the  size 4 restrictor was placed at the appropriate depth of the canal and the  canal was prepared with pulsatile lavage.  Cement is mixed and once ready  for implantation, injecting it and the canal pressurized.  The size 2  standard offset Endurance Luster stem is then placed in anatomic position  and once the cement fully hardens, the permanent 28 plus 1.5 head is  impacted.  The hip is reduced to the same stability parameters.  The wound  was copiously irrigated with antibiotic solution and short rotators  reattached to the femur through drill holes.  Fascia lata was closed over a  Hemovac drain with interrupted #1 Vicryl, subcu closed with #1 and 2-0  Vicryl, subcuticular running 4-0 Monocryl.  Incision was cleaned and dried  and Steri-Strips and a bulky sterile dressing applied. She was subsequently  awakened and transported to the recovery in stable condition.                                                Ollen Gross,  M.D.    FA/MEDQ  D:  01/23/2003  T:  01/23/2003  Job:  [id number]

## 2010-10-18 NOTE — Discharge Summary (Signed)
NAME:  Leah [name], Leah [name]                       ACCOUNT NO.:  [id number]   MEDICAL RECORD NO.:  [id number]                   PATIENT TYPE:  INP   LOCATION:  0476                                 FACILITY:  [facility]   PHYSICIAN:  Ollen Gross, M.D.                 DATE OF BIRTH:  Aug 24, 1929   DATE OF ADMISSION:  01/23/2003  DATE OF DISCHARGE:  01/27/2003                                 DISCHARGE SUMMARY   ADMISSION DIAGNOSES:  1. Osteoarthritis, right hip.  2. Cystitis.   DISCHARGE DIAGNOSES:  1. Osteoarthritis, right hip, status post right total hip replacement     arthroplasty.  2. Mild postoperative blood loss anemia.  3. Cystitis.   PROCEDURE:  The patient was taken to the OR on January 23, 2003 and underwent  a right total hip per Dr. Ollen Gross.   ASSISTANT:  Alexzandrew L. Julien Girt, P.A.   ANESTHESIA:  Spinal.   ESTIMATED BLOOD LOSS:  400 mL.   DRAINS:  Hemovac drains x1.   CONSULTATIONS:  None.   BRIEF HISTORY AND PHYSICAL:  This is a 77 year old female who has been by  Dr. [name] for ongoing right hip pain.  She has been seen in the office.  Her x-ray showed moderately advanced arthritis with joint space narrowing,  marginal osteophyte formation of the femoral head.  Films were compared to  films earlier this year and she has shown some progressive changes felt due  to her pain.  She would benefit from undergoing hip replacement.  Risks and  benefits discussed.  The patient was subsequently admitted to the hospital.   LABORATORY DATA:  CBC on admission:  Hemoglobin 14, hematocrit 40.4, white  cell count 5.9, red cell count 4.51, differential within normal limits.  Postoperative H&H 11.6, 33.3.  Last H&H 11.5 and 32.9.  PT/PTT on admission  12.2 and 28 respectively, INR 0.9.  Serial pro times followed.  Last noted  PT/INR 20 and 2.2.  Chem on admission all within normal limits.  Serial  BMETs were followed.  She had a drop in calcium from 9.3 to 8, is back up  to  8.5.  Glucose went up from 73 to 131, back down to 114.  Electrolytes  remained within normal limits.  Urinalysis on admission:  Small leukocyte  esterase with only 36 white cells, otherwise negative.  Blood group type O  positive.   EKG dated January 16, 2003:  Normal sinus rhythm, normal EKG, confirmed by  Dr. Julieanne Manson.  Pelvis and hip films on January 23, 2003:  Good position  alignment of right total hip arthroplasty.  Preoperative hip films on January 16, 2003, showed marked osteoarthritis of the right hip.  Chest x-ray dated  January 16, 2003, negative chest for active disease.   HOSPITAL COURSE:  The patient was admitted to [facility] and was  taken to OR and underwent the above stated  procedure without complication.  The patient tolerated the procedure well and was later transferred to  recovery room then to a __________ floor to continue postoperative care.  Vital signs were followed.  The patient was given 24 hours of postoperative  IV Ancef, placed on three weeks of Coumadin for DVT prophylaxis.  She was  placed on PC and p.o. analgesics for pain control following surgery, placed  on partial weight bearing of 50% to the right lower extremity.  Hemovac  drain placed day of surgery and pulled on postoperative day #1.  She  underwent a dressing change on postoperative day #2.  Incision was healing  well.  She was weaned over to p.o. medications for the first 48 hours.  PCA  was discontinued by postoperative day #2 along with the IV.  Labs remained  stable.  She actually did fairly well with her pain control.  She also did  extremely well with her physical therapy, up and ambulating approximately 40  feet by day #2 with 75 feet that evening, then up to 120 feet by the  following day.  She continued to progress very well and had discharge  planning arranged for her home care.  By day #4 she had been seen in rounds  and was doing well and was discharged home on  discharge medications.   PLAN:  The patient was discharged home on January 27, 2003.   DISCHARGE DIAGNOSES:  Please see above.   DISCHARGE MEDICATIONS:  1. Coumadin.  2. Percocet.  3. Robaxin.   DIET:  As tolerated.   FOLLOW UP:  Two weeks.   ACTIVITY:  She is to be increased from 50% to weight bearing as tolerated to  the right lower extremity.  PT and home health nurse through [facility].  Total hip protocol.    DISPOSITION:  Home.   CONDITION ON DISCHARGE:  Improved.         Alexzandrew L. Julien Girt, P.A.              Ollen Gross, M.D.    ALP/MEDQ  D:  03/03/2003  T:  03/04/2003  Job:  [id number]   cc:   Ollen Gross, M.D.  [address]  [place]  [place] [address]  Fax: [phone]   Stacie Glaze, M.D. [facility]

## 2010-11-18 ENCOUNTER — Encounter (INDEPENDENT_AMBULATORY_CARE_PROVIDER_SITE_OTHER): Payer: Medicare Other | Admitting: Obstetrics and Gynecology

## 2010-11-18 DIAGNOSIS — N8111 Cystocele, midline: Secondary | ICD-10-CM

## 2010-11-18 DIAGNOSIS — M949 Disorder of cartilage, unspecified: Secondary | ICD-10-CM

## 2010-11-18 DIAGNOSIS — R82998 Other abnormal findings in urine: Secondary | ICD-10-CM

## 2010-11-18 DIAGNOSIS — N814 Uterovaginal prolapse, unspecified: Secondary | ICD-10-CM

## 2010-11-26 ENCOUNTER — Other Ambulatory Visit: Payer: Medicare Other

## 2010-12-19 ENCOUNTER — Encounter: Payer: Self-pay | Admitting: Internal Medicine

## 2010-12-19 ENCOUNTER — Ambulatory Visit (INDEPENDENT_AMBULATORY_CARE_PROVIDER_SITE_OTHER): Payer: Medicare Other | Admitting: Internal Medicine

## 2010-12-19 DIAGNOSIS — M720 Palmar fascial fibromatosis [Dupuytren]: Secondary | ICD-10-CM

## 2010-12-19 DIAGNOSIS — E785 Hyperlipidemia, unspecified: Secondary | ICD-10-CM

## 2010-12-19 DIAGNOSIS — M1710 Unilateral primary osteoarthritis, unspecified knee: Secondary | ICD-10-CM

## 2010-12-19 DIAGNOSIS — T887XXA Unspecified adverse effect of drug or medicament, initial encounter: Secondary | ICD-10-CM

## 2010-12-19 DIAGNOSIS — M24549 Contracture, unspecified hand: Secondary | ICD-10-CM

## 2010-12-19 DIAGNOSIS — M722 Plantar fascial fibromatosis: Secondary | ICD-10-CM | POA: Insufficient documentation

## 2010-12-19 LAB — LIPID PANEL
HDL: 125.5 mg/dL (ref >39.00)
Total CHOL/HDL Ratio: 2
Triglycerides: 51 mg/dL (ref 0.0–149.0)
VLDL: 10.2 mg/dL (ref 0.0–40.0)

## 2010-12-19 LAB — BASIC METABOLIC PANEL
CO2: 29 mEq/L (ref 19–32)
Calcium: 9.7 mg/dL (ref 8.4–10.5)
Chloride: 106 mEq/L (ref 96–112)
Glucose, Bld: 93 mg/dL (ref 70–99)
Potassium: 5.5 mEq/L — ABNORMAL HIGH (ref 3.5–5.1)
Sodium: 145 mEq/L (ref 135–145)

## 2010-12-19 LAB — LDL CHOLESTEROL, DIRECT: Direct LDL: 111.1 mg/dL

## 2010-12-19 MED ORDER — AMBULATORY NON FORMULARY MEDICATION: 1.0000 | Freq: Every day | Status: DC

## 2010-12-19 NOTE — Patient Instructions (Signed)
The area on the palms of her hand are called Dupuytren's contracture. They are calcified areas on the tendon that causes a gathering effect. We'll treat with steroid injections.  you need to wear padded gloves when working out

## 2011-01-02 MED ORDER — METHYLPREDNISOLONE ACETATE 40 MG/ML IJ SUSP: 20.0000 mg | Freq: Once | INTRAMUSCULAR | Status: DC

## 2011-01-02 NOTE — Progress Notes (Signed)
Subjective:    Patient ID: Leah [name], female    DOB: [birth year], 77 y.o.   MRN: [id number]  HPI  Patient is 77 year old white female who presents for followup of hyperlipidemia gastroesophageal reflux and osteoarthritis.  She has marked osteoarthritis in her hands bilaterally that appear to be Dupuytren's contractures.  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    Review of Systems  Constitutional: Negative for activity change, appetite change and fatigue.  HENT: Negative for ear pain, congestion, neck pain, postnasal drip and sinus pressure.   Eyes: Negative for redness and visual disturbance.  Respiratory: Negative for cough, shortness of breath and wheezing.   Gastrointestinal: Negative for abdominal pain and abdominal distention.  Genitourinary: Negative for dysuria, frequency and menstrual problem.  Musculoskeletal: Negative for myalgias, joint swelling and arthralgias.  Skin: Negative for rash and wound.  Neurological: Negative for dizziness, weakness and headaches.  Hematological: Negative for adenopathy. Does not bruise/bleed easily.  Psychiatric/Behavioral: Negative for sleep disturbance and decreased concentration.   Past Medical History  Diagnosis Date  . Carpal tunnel syndrome   . GERD (gastroesophageal reflux disease)   . Acute maxillary sinusitis   . Unspecified adverse effect of unspecified drug, medicinal and biological substance   . Basal cell carcinoma of nose   . Bundle branch block, unspecified   . Localized osteoarthrosis not specified whether primary or secondary, lower leg   . Hyperlipidemia   . Family history of malignant neoplasm of breast   . Family history of diabetes mellitus   . Osteoporosis   .  Osteopenia   . DUB (dysfunctional uterine bleeding)   . Endometrial polyp   . Uterine prolapse   . Cystocele   . Detrusor instability   . Glaucoma    Past Surgical History  Procedure Date  . Arthroscopic r knee   . Flexible sigmoidoscopy   . Joint replacement   . Total hip rerplacement bilateral   . Dilation and curettage of uterus 1982  . Carpal tunnel release 2011  . Hysteroscopy w/d&c 2005 and 2008    reports that she has quit smoking. She does not have any smokeless tobacco history on file. She reports that she drinks alcohol. She reports that she does not use illicit drugs. family history includes Breast cancer in an unspecified family member; Cancer in her mother; Dementia in her mother and sister; Heart disease in her father; and Stroke in her father. Allergies  Allergen Reactions  . Nabumetone   . Naproxen Sodium   . Tramadol Hcl     REACTION: caused nausea and vomiting        Objective:   Physical Exam  Constitutional: She is oriented to person, place, and time. She appears well-developed and well-nourished. No distress.  HENT:  Head: Normocephalic and atraumatic.  Right Ear: External ear normal.  Left Ear: External ear normal.  Nose: Nose normal.  Mouth/Throat: Oropharynx is clear and moist.  Eyes: Conjunctivae and EOM are normal. Pupils are equal, round, and reactive to light.  Neck: Normal range of motion. Neck supple. No JVD present. No tracheal deviation present. No thyromegaly present.  Cardiovascular: Normal rate, regular rhythm, normal heart sounds and intact distal pulses.   No murmur heard.  Pulmonary/Chest: Effort normal and breath sounds normal. She has no wheezes. She exhibits no tenderness.  Abdominal: Soft. Bowel sounds are normal.  Musculoskeletal: Normal range of motion. She exhibits tenderness. She exhibits no edema.        The patient has contractures on the third and fourth tendon of her hands bilaterally  Lymphadenopathy:    She has no  cervical adenopathy.  Neurological: She is alert and oriented to person, place, and time. She has normal reflexes. No cranial nerve deficit.  Skin: Skin is warm and dry. She is not diaphoretic.  Psychiatric: She has a normal mood and affect. Her behavior is normal.          Assessment & Plan:  Her blood pressure appears to be stable her cholesterol has been monitored and she is up-to-date with all monitoring on that she continues to take omega-3 fatty acids as her primary cholesterol intervention we will measure a lipid panel during this office visit She has been on prolia for osteoporosis. She does take calcium it and I'm concerned that her use of calcium may contribute to her calcification of the tendons.  We discussed modifying her calcium intake and injecting attendance today.  She gave informed consent and her hand was prepped with Betadine and 2 of her flexor tendons of her right hand were treated with steroid injections site was prepped with Betadine and 20 mg of Depo-Medrol and 1% lidocaine was infused into the tendons

## 2011-03-12 ENCOUNTER — Telehealth: Payer: Self-pay | Admitting: *Deleted

## 2011-03-12 ENCOUNTER — Ambulatory Visit: Payer: Medicare Other | Admitting: Internal Medicine

## 2011-03-12 LAB — CBC
HCT: 36.9
Platelets: 206
RDW: 14.4 — ABNORMAL HIGH

## 2011-03-12 LAB — DIFFERENTIAL
Basophils Absolute: 0
Eosinophils Relative: 3
Lymphocytes Relative: 30
Neutrophils Relative %: 64

## 2011-03-12 NOTE — Telephone Encounter (Signed)
Pt states she has a mass (boil) on her foot that is growing rapidly, and looks infected and red.  Would like to have Dr. Lovell Sheehan see it ASAP.  She is going on a 19 day trip next week.  Please call.

## 2011-03-12 NOTE — Telephone Encounter (Signed)
Pt is calling back asking for appt ASAP.

## 2011-03-12 NOTE — Telephone Encounter (Signed)
Ov this pm

## 2011-03-13 ENCOUNTER — Encounter (INDEPENDENT_AMBULATORY_CARE_PROVIDER_SITE_OTHER): Payer: Medicare Other | Admitting: Internal Medicine

## 2011-03-13 VITALS — BP 130/80 | HR 60 | Temp 98.2°F | Resp 14 | Ht 65.0 in | Wt 152.0 lb

## 2011-03-13 DIAGNOSIS — Z23 Encounter for immunization: Secondary | ICD-10-CM

## 2011-03-13 DIAGNOSIS — L03119 Cellulitis of unspecified part of limb: Secondary | ICD-10-CM

## 2011-03-13 MED ORDER — SULFAMETHOXAZOLE-TMP DS 800-160 MG PO TABS: 1.0000 | ORAL_TABLET | Freq: Two times a day (BID) | ORAL | Status: AC

## 2011-03-13 NOTE — Patient Instructions (Signed)
co enzyme Q10 lotion apply liberally to the arms and legs

## 2011-06-12 ENCOUNTER — Ambulatory Visit (INDEPENDENT_AMBULATORY_CARE_PROVIDER_SITE_OTHER): Payer: Medicare Other | Admitting: Internal Medicine

## 2011-06-12 ENCOUNTER — Encounter: Payer: Self-pay | Admitting: Internal Medicine

## 2011-06-12 VITALS — BP 114/72 | HR 72 | Temp 98.3°F | Resp 16 | Ht 65.0 in | Wt 152.0 lb

## 2011-06-12 DIAGNOSIS — L02619 Cutaneous abscess of unspecified foot: Secondary | ICD-10-CM

## 2011-06-12 DIAGNOSIS — M1710 Unilateral primary osteoarthritis, unspecified knee: Secondary | ICD-10-CM

## 2011-06-12 DIAGNOSIS — L03115 Cellulitis of right lower limb: Secondary | ICD-10-CM

## 2011-06-12 DIAGNOSIS — K219 Gastro-esophageal reflux disease without esophagitis: Secondary | ICD-10-CM

## 2011-06-12 MED ORDER — MUPIROCIN CALCIUM 2 % EX CREA: TOPICAL_CREAM | Freq: Three times a day (TID) | CUTANEOUS | Status: AC

## 2011-06-12 NOTE — Progress Notes (Signed)
Subjective:    Patient ID: Leah [name], female    DOB: 03/14/1930, 78 y.o.   MRN: [id number]  HPI The pt has increased concerns about strength and balance. Blood pressure good Knee injections Dr Berton Lan and possible TKR will be soon GERD stable Having issues walking up steps Compliant with hyperlipidemia treated    Review of Systems  Constitutional: Negative for activity change, appetite change and fatigue.  HENT: Negative for ear pain, congestion, neck pain, postnasal drip and sinus pressure.   Eyes: Negative for redness and visual disturbance.  Respiratory: Negative for cough, shortness of breath and wheezing.   Gastrointestinal: Negative for abdominal pain and abdominal distention.  Genitourinary: Negative for dysuria, frequency and menstrual problem.  Musculoskeletal: Negative for myalgias, joint swelling and arthralgias.  Skin: Negative for rash and wound.  Neurological: Negative for dizziness, weakness and headaches.  Hematological: Negative for adenopathy. Does not bruise/bleed easily.  Psychiatric/Behavioral: Negative for sleep disturbance and decreased concentration.   Past Medical History  Diagnosis Date  . Carpal tunnel syndrome   . GERD (gastroesophageal reflux disease)   . Acute maxillary sinusitis   . Unspecified adverse effect of unspecified drug, medicinal and biological substance   . Basal cell carcinoma of nose   . Bundle branch block, unspecified   . Localized osteoarthrosis not specified whether primary or secondary, lower leg   . Hyperlipidemia   . Family history of malignant neoplasm of breast   . Family history of diabetes mellitus   . Osteoporosis   . Osteopenia   . DUB (dysfunctional uterine bleeding)   . Endometrial polyp   . Uterine prolapse   . Cystocele   . Detrusor instability   . Glaucoma     History   Social History  . Marital Status: Married    Spouse Name: N/A    Number of Children: N/A  . Years of Education: N/A    Occupational History  . Not on file.   Social History Main Topics  . Smoking status: Former Games developer  . Smokeless tobacco: Not on file  . Alcohol Use: Yes  . Drug Use: No  . Sexually Active: Yes   Other Topics Concern  . Not on file   Social History Narrative  . No narrative on file    Past Surgical History  Procedure Date  . Arthroscopic r knee   . Flexible sigmoidoscopy   . Joint replacement   . Total hip rerplacement bilateral   . Dilation and curettage of uterus 1982  . Carpal tunnel release 2011  . Hysteroscopy w/d&c 2005 and 2008    Family History  Problem Relation Age of Onset  . Breast cancer    . Cancer Mother   . Dementia Mother   . Heart disease Father   . Stroke Father   . Dementia Sister     Allergies  Allergen Reactions  . Sulfa Antibiotics     RASH  . Nabumetone   . Naproxen Sodium   . Tramadol Hcl     REACTION: caused nausea and vomiting    Current Outpatient Prescriptions on File Prior to Visit  Medication Sig Dispense Refill  . acyclovir (ZOVIRAX) 5 % ointment Apply topically every 3 (three) hours as needed.        . AMBULATORY NON FORMULARY MEDICATION Take 1 packet by mouth daily. Medication Name: women's ultra mega joint ( [employer])      . Ascorbic Acid (VITAMIN C) 1000 MG tablet Take 1,000 mg by mouth  daily.        . aspirin 81 MG EC tablet Take 81 mg by mouth daily.        . Calcium Carbonate (CALCIUM 600) 1500 MG TABS Take 1 tablet by mouth 2 (two) times daily.        . Cholecalciferol (VITAMIN D) 1000 UNITS capsule Take 1,000 Units by mouth daily.        Marland Kitchen latanoprost (XALATAN) 0.005 % ophthalmic solution 1 drop at bedtime.        . meloxicam (MOBIC) 15 MG tablet Take 1 tablet (15 mg total) by mouth daily.  90 tablet  3  . mometasone (NASONEX) 50 MCG/ACT nasal spray 2 sprays by Nasal route daily.        . NON FORMULARY womens ultra mega joint 1 every day        . Omega-3 Fatty Acids (FISH OIL) 1000 MG CAPS Take 4 capsules by mouth  daily.        Marland Kitchen omeprazole (PRILOSEC) 20 MG capsule Take 20 mg by mouth daily.        . vitamin E 400 UNIT capsule Take 400 Units by mouth daily.         Current Facility-Administered Medications on File Prior to Visit  Medication Dose Route Frequency Provider Last Rate Last Dose  . DISCONTD: methylPREDNISolone acetate (DEPO-MEDROL) injection 20 mg  20 mg Intra-articular Once Carrie Mew        BP 114/72  Pulse 72  Temp 98.3 F (36.8 C)  Resp 16  Ht 5\' 5"  (1.651 m)  Wt 152 lb (68.947 kg)  BMI 25.29 kg/m2       Objective:   Physical Exam  Nursing note and vitals reviewed. Constitutional: She is oriented to person, place, and time. She appears well-developed and well-nourished. No distress.  HENT:  Head: Normocephalic and atraumatic.  Right Ear: External ear normal.  Left Ear: External ear normal.  Nose: Nose normal.  Mouth/Throat: Oropharynx is clear and moist.  Eyes: Conjunctivae and EOM are normal. Pupils are equal, round, and reactive to light.  Neck: Normal range of motion. Neck supple. No JVD present. No tracheal deviation present. No thyromegaly present.  Cardiovascular: Normal rate, regular rhythm, normal heart sounds and intact distal pulses.   No murmur heard. Pulmonary/Chest: Effort normal and breath sounds normal. She has no wheezes. She exhibits no tenderness.  Abdominal: Soft. Bowel sounds are normal.  Musculoskeletal: She exhibits edema and tenderness.  Lymphadenopathy:    She has no cervical adenopathy.  Neurological: She is alert and oriented to person, place, and time. She has normal reflexes. No cranial nerve deficit.  Skin: Skin is warm and dry. She is not diaphoretic.  Psychiatric: She has a normal mood and affect. Her behavior is normal.          Assessment & Plan:  Patient has polyarticular osteoarthritis but has progressive knee pain affecting her ability to exercise Exercise has been a component in her health and longevity and her  decreasing ability to exercise on a regular basis will progressively affect her overall condition.  Her comorbidities of hyperlipidemia and gastroesophageal reflux will both be affected. We encouraged her to continue to exercise especially can discuss knee replacement surgery timing with her orthopedist.   I have spent more than 30 minutes examining this patient face-to-face of which over half was spent in counseling

## 2011-06-12 NOTE — Patient Instructions (Addendum)
Use the topical antibiotic on the site    Discussed with Dr. Antony Odea the fact that you're having progressive balance issues from muscle weakness and that she would have difficulty walking upstairs and have to take them one step at a time it would appear from how your degenerative knee disease is starting to affect her overall physical conditioning at the time has come to consider total knee replacement

## 2011-06-30 ENCOUNTER — Telehealth: Payer: Self-pay | Admitting: Internal Medicine

## 2011-06-30 NOTE — Telephone Encounter (Signed)
Pt is going to need to get surgical clearance for Knee replacement surgery that is schedule 10/14/11. Pt is schd to see Dr Lovell Sheehan in April, but is needing surgery clearance prior to that.

## 2011-06-30 NOTE — Telephone Encounter (Signed)
Please let pt know this was signed last week and sent back to ortho

## 2011-06-30 NOTE — Telephone Encounter (Signed)
Called pt and notified her that surgical clearance was already done last week and was sent to ortho.

## 2011-08-19 ENCOUNTER — Telehealth: Payer: Self-pay | Admitting: *Deleted

## 2011-08-19 NOTE — Telephone Encounter (Signed)
Per dr Temple Pacini to go to podiatrist--dr ajlouney--and per dr Lovell Sheehan- can soak toe in 1 cup white vinegar and 1 gallon warm water 3 times a week

## 2011-08-19 NOTE — Telephone Encounter (Signed)
Notified pt. 

## 2011-08-19 NOTE — Telephone Encounter (Signed)
Pt would like to see Dr. Lovell Sheehan for a toenail that is falling off.  Please call with appt.

## 2011-09-16 ENCOUNTER — Other Ambulatory Visit: Payer: Self-pay | Admitting: Internal Medicine

## 2011-09-24 ENCOUNTER — Ambulatory Visit (INDEPENDENT_AMBULATORY_CARE_PROVIDER_SITE_OTHER): Payer: Medicare Other | Admitting: Internal Medicine

## 2011-09-24 ENCOUNTER — Encounter: Payer: Self-pay | Admitting: Internal Medicine

## 2011-09-24 VITALS — BP 136/80 | HR 64 | Temp 98.1°F | Resp 16 | Ht 63.0 in | Wt 152.0 lb

## 2011-09-24 DIAGNOSIS — T887XXA Unspecified adverse effect of drug or medicament, initial encounter: Secondary | ICD-10-CM

## 2011-09-24 DIAGNOSIS — I10 Essential (primary) hypertension: Secondary | ICD-10-CM

## 2011-09-24 DIAGNOSIS — M1710 Unilateral primary osteoarthritis, unspecified knee: Secondary | ICD-10-CM

## 2011-09-24 DIAGNOSIS — E785 Hyperlipidemia, unspecified: Secondary | ICD-10-CM

## 2011-09-24 DIAGNOSIS — Z0000 Encounter for general adult medical examination without abnormal findings: Secondary | ICD-10-CM

## 2011-09-24 DIAGNOSIS — Z23 Encounter for immunization: Secondary | ICD-10-CM

## 2011-09-24 LAB — BASIC METABOLIC PANEL
BUN: 20 mg/dL (ref 6–23)
Chloride: 106 mEq/L (ref 96–112)
Glucose, Bld: 90 mg/dL (ref 70–99)
Potassium: 4.1 mEq/L (ref 3.5–5.1)

## 2011-09-24 LAB — CBC WITH DIFFERENTIAL/PLATELET
Basophils Relative: 0.6 % (ref 0.0–3.0)
Eosinophils Absolute: 0.2 10*3/uL (ref 0.0–0.7)
HCT: 41 % (ref 36.0–46.0)
Hemoglobin: 13.7 g/dL (ref 12.0–15.0)
Lymphs Abs: 2 10*3/uL (ref 0.7–4.0)
Monocytes Absolute: 0.3 10*3/uL (ref 0.1–1.0)
Monocytes Relative: 6.8 % (ref 3.0–12.0)
Neutro Abs: 2.5 10*3/uL (ref 1.4–7.7)
RBC: 4.57 Mil/uL (ref 3.87–5.11)
RDW: 14.9 % — ABNORMAL HIGH (ref 11.5–14.6)

## 2011-09-24 LAB — LIPID PANEL
Cholesterol: 242 mg/dL — ABNORMAL HIGH (ref 0–200)
Total CHOL/HDL Ratio: 2

## 2011-09-24 LAB — POCT URINALYSIS DIPSTICK
Leukocytes, UA: NEGATIVE
Nitrite, UA: NEGATIVE
Protein, UA: NEGATIVE
pH, UA: 6

## 2011-09-24 LAB — HEPATIC FUNCTION PANEL
ALT: 23 U/L (ref 0–35)
AST: 31 U/L (ref 0–37)
Albumin: 4.1 g/dL (ref 3.5–5.2)
Total Bilirubin: 0.6 mg/dL (ref 0.3–1.2)

## 2011-09-24 LAB — TSH: TSH: 2.4 u[IU]/mL (ref 0.35–5.50)

## 2011-09-24 MED ORDER — TETANUS-DIPHTHERIA TOXOIDS TD 5-2 LFU IM INJ: 0.5000 mL | INJECTION | Freq: Once | INTRAMUSCULAR | Status: DC

## 2011-09-24 NOTE — Progress Notes (Signed)
Subjective:    Leah [name] is a 78 y.o. female who presents for Medicare Annual/Subsequent preventive examination.  Preventive Screening-Counseling & Management  Tobacco History  Smoking status  . Former Smoker  Smokeless tobacco  . Not on file     Problems Prior to Visit 1. patient is seen today in addition to her wellness examination for preop approval for knee replacement.  She had an EKG taken today which showed a constant left bundle branch block which has been present since 1998 and is unchanged. As also followed for osteoarthritis Mild reflux mild hyperlipidemia.  Appropriate laboratory values will be drawn today including a lipid liver CBC with differential TSH and a basic metabolic panel     Current Problems (verified) Patient Active Problem List  Diagnoses  . FEVER BLISTER  . HYPERLIPIDEMIA  . CARPAL TUNNEL SYNDROME, RIGHT  . BUNDLE BRANCH BLOCK  . ALLERGIC RHINITIS CAUSE UNSPECIFIED  . GERD  . OSTEOARTHROSIS, LOCAL NOS, LOWER LEG  . OSTEOPOROSIS  . OSTEOPENIA  . BEE STING REACTION, LOCAL  . UNS ADVRS EFF UNS RX MEDICINAL&BIOLOGICAL SBSTNC  . BASAL CELL CARCINOMA, NOSE  . GLAUCOMA STAGE UNSPECIFIED  . Plantar fascia syndrome    Medications Prior to Visit Current Outpatient Prescriptions on File Prior to Visit  Medication Sig Dispense Refill  . acyclovir (ZOVIRAX) 5 % ointment Apply topically every 3 (three) hours as needed.        . AMBULATORY NON FORMULARY MEDICATION Take 1 packet by mouth daily. Medication Name: women's ultra mega joint ( [facility])      . Ascorbic Acid (VITAMIN C) 1000 MG tablet Take 1,000 mg by mouth daily.        Marland Kitchen aspirin 81 MG EC tablet Take 81 mg by mouth daily.        . Calcium Carbonate (CALCIUM 600) 1500 MG TABS Take 1 tablet by mouth 2 (two) times daily.        . Cholecalciferol (VITAMIN D) 1000 UNITS capsule Take 1,000 Units by mouth daily.        Marland Kitchen latanoprost (XALATAN) 0.005 % ophthalmic solution 1 drop at bedtime.         Marland Kitchen MOBIC 15 MG tablet TAKE 1 TABLET EACH DAY.  90 each  3  . mometasone (NASONEX) 50 MCG/ACT nasal spray 2 sprays by Nasal route daily.        . NON FORMULARY womens ultra mega joint 1 every day        . Omega-3 Fatty Acids (FISH OIL) 1000 MG CAPS Take 4 capsules by mouth daily.        Marland Kitchen omeprazole (PRILOSEC) 20 MG capsule Take 20 mg by mouth daily.        . vitamin E 400 UNIT capsule Take 400 Units by mouth daily.          Current Medications (verified) Current Outpatient Prescriptions  Medication Sig Dispense Refill  . acyclovir (ZOVIRAX) 5 % ointment Apply topically every 3 (three) hours as needed.        . AMBULATORY NON FORMULARY MEDICATION Take 1 packet by mouth daily. Medication Name: women's ultra mega joint ( [facility])      . Ascorbic Acid (VITAMIN C) 1000 MG tablet Take 1,000 mg by mouth daily.        Marland Kitchen aspirin 81 MG EC tablet Take 81 mg by mouth daily.        . Calcium Carbonate (CALCIUM 600) 1500 MG TABS Take 1 tablet by mouth 2 (  two) times daily.        . Cholecalciferol (VITAMIN D) 1000 UNITS capsule Take 1,000 Units by mouth daily.        Marland Kitchen latanoprost (XALATAN) 0.005 % ophthalmic solution 1 drop at bedtime.        Marland Kitchen MOBIC 15 MG tablet TAKE 1 TABLET EACH DAY.  90 each  3  . mometasone (NASONEX) 50 MCG/ACT nasal spray 2 sprays by Nasal route daily.        . NON FORMULARY womens ultra mega joint 1 every day        . Omega-3 Fatty Acids (FISH OIL) 1000 MG CAPS Take 4 capsules by mouth daily.        Marland Kitchen omeprazole (PRILOSEC) 20 MG capsule Take 20 mg by mouth daily.        . vitamin E 400 UNIT capsule Take 400 Units by mouth daily.           Allergies (verified) Sulfa antibiotics; Nabumetone; Naproxen sodium; and Tramadol hcl   PAST HISTORY  Family History Family History  Problem Relation Age of Onset  . Breast cancer    . Cancer Mother   . Dementia Mother   . Heart disease Father   . Stroke Father   . Dementia Sister     Social History History  Substance Use Topics   . Smoking status: Former Games developer  . Smokeless tobacco: Not on file  . Alcohol Use: Yes     Are there smokers in your home (other than you)? No  Risk Factors Current exercise habits: Gym/ health club routine includes cardio and light weights.  Dietary issues discussed: none   Cardiac risk factors: advanced age (older than 28 for men, 65 for women) and dyslipidemia.  Depression Screen (Note: if answer to either of the following is "Yes", a more complete depression screening is indicated)   Over the past two weeks, have you felt down, depressed or hopeless? No  Over the past two weeks, have you felt little interest or pleasure in doing things? No  Have you lost interest or pleasure in daily life? No  Do you often feel hopeless? No  Do you cry easily over simple problems? No  Activities of Daily Living In your present state of health, do you have any difficulty performing the following activities?:  Driving? No Managing money?  No Feeding yourself? No Getting from bed to chair? No Climbing a flight of stairs? No Preparing food and eating?: No Bathing or showering? No Getting dressed: No Getting to the toilet? No Using the toilet:No Moving around from place to place: No In the past year have you fallen or had a near fall?:No   Are you sexually active?  Yes  Do you have more than one partner?  No  Hearing Difficulties: No Do you often ask people to speak up or repeat themselves? No Do you experience ringing or noises in your ears? No Do you have difficulty understanding soft or whispered voices? No   Do you feel that you have a problem with memory? No  Do you often misplace items? No  Do you feel safe at home?  No  Cognitive Testing  Alert? Yes  Normal Appearance?Yes  Oriented to person? Yes  Place? Yes   Time? Yes  Recall of three objects?  Yes  Can perform simple calculations? Yes  Displays appropriate judgment?Yes  Can read the correct time from a watch  face?Yes   Advanced Directives have been discussed  with the patient? Yes  List the Names of Other Physician/Practitioners you currently use: 1.    Indicate any recent Medical Services you may have received from other than [facility] providers in the past year (date may be approximate).  Immunization History  Administered Date(s) Administered  . Influenza Split 03/13/2011  . Influenza Whole 06/02/2004, 04/02/2007, 02/24/2008, 03/30/2009, 02/14/2010  . Pneumococcal Polysaccharide 06/02/2005  . Td 06/02/2001    Screening Tests Health Maintenance  Topic Date Due  . Zostavax  05/29/1992  . Tetanus/tdap  06/03/2011  . Influenza Vaccine  03/02/2012  . Colonoscopy  02/16/2018  . Pneumococcal Polysaccharide Vaccine Age 14 And Over  Completed    All answers were reviewed with the patient and necessary referrals were made:  Carrie Mew, MD   09/24/2011   History reviewed: allergies, current medications, past family history, past medical history, past social history, past surgical history and problem list  Review of Systems A comprehensive review of systems was negative.    Objective:     Vision by Snellen chart: right eye:20/20, left eye:20/20 with glasses  There is no height or weight on file to calculate BMI. There were no vitals taken for this visit.  There were no vitals taken for this visit.  General Appearance:    Alert, cooperative, no distress, appears stated age  Head:    Normocephalic, without obvious abnormality, atraumatic  Eyes:    PERRL, conjunctiva/corneas clear, EOM's intact, fundi    benign, both eyes  Ears:    Normal TM's and external ear canals, both ears  Nose:   Nares normal, septum midline, mucosa normal, no drainage    or sinus tenderness  Throat:   Lips, mucosa, and tongue normal; teeth and gums normal  Neck:   Supple, symmetrical, trachea midline, no adenopathy;    thyroid:  no enlargement/tenderness/nodules; no carotid   bruit or JVD  Back:      Symmetric, no curvature, ROM normal, no CVA tenderness  Lungs:     Clear to auscultation bilaterally, respirations unlabored  Chest Wall:    No tenderness or deformity   Heart:    Regular rate and rhythm, S1 and S2 normal, no murmur, rub   or gallop  Breast Exam:    No tenderness, masses, or nipple abnormality  Abdomen:     Soft, non-tender, bowel sounds active all four quadrants,    no masses, no organomegaly  Genitalia:    Normal female without lesion, discharge or tenderness  Rectal:    Normal tone, normal prostate, no masses or tenderness;   guaiac negative stool  Extremities:   Extremities normal, atraumatic, no cyanosis or edema  Pulses:   2+ and symmetric all extremities  Skin:   Skin color, texture, turgor normal, no rashes or lesions  Lymph nodes:   Cervical, supraclavicular, and axillary nodes normal  Neurologic:   CNII-XII intact, normal strength, sensation and reflexes    throughout       Assessment:         This is a routine physical examination for this healthy  Female. Reviewed all health maintenance protocols including mammography colonoscopy bone density and reviewed appropriate screening labs. Her immunization history was reviewed as well as her current medications and allergies refills of her chronic medications were given and the plan for yearly health maintenance was discussed all orders and referrals were made as appropriate.  Plan:     During the course of the visit the patient was educated and counseled about  appropriate screening and preventive services including:    Influenza vaccine  Td vaccine  Colorectal cancer screening  Diet review for nutrition referral? Yes ____  Not Indicated __x__   Patient Instructions (the written plan) was given to the patient.  Medicare Attestation I have personally reviewed: The patient's medical and social history Their use of alcohol, tobacco or illicit drugs Their current medications and supplements The  patient's functional ability including ADLs,fall risks, home safety risks, cognitive, and hearing and visual impairment Diet and physical activities Evidence for depression or mood disorders  The patient's weight, height, BMI, and visual acuity have been recorded in the chart.  I have made referrals, counseling, and provided education to the patient based on review of the above and I have provided the patient with a written personalized care plan for preventive services.     We have monitored this patient's lipid or liver her thyroid patient metabolic panel a urinalysis today for appropriate diagnosis dealing with her hyperlipidemia her hypertension the side effects of the drug and also for preoperative clearance  Carrie Mew, MD   09/24/2011

## 2011-09-24 NOTE — Patient Instructions (Signed)
Fungal nail I would recommend that you've soak your feet in a cup of white vinegar and a gallon of warm water 3 times a week for about 20 minutes You're cleared for your orthopedic surgery from the standpoint of cardiovascular and internal medicine risk

## 2011-09-30 ENCOUNTER — Encounter (HOSPITAL_COMMUNITY): Payer: Self-pay | Admitting: Pharmacy Technician

## 2011-10-06 ENCOUNTER — Other Ambulatory Visit: Payer: Self-pay | Admitting: Orthopedic Surgery

## 2011-10-06 ENCOUNTER — Encounter (HOSPITAL_COMMUNITY): Payer: Self-pay

## 2011-10-06 ENCOUNTER — Ambulatory Visit (HOSPITAL_COMMUNITY)
Admission: RE | Admit: 2011-10-06 | Discharge: 2011-10-06 | Disposition: A | Discharge status: Home / Self Care | Payer: Medicare Other | Source: Ambulatory Visit | Attending: Orthopedic Surgery | Admitting: Orthopedic Surgery

## 2011-10-06 ENCOUNTER — Encounter (HOSPITAL_COMMUNITY)
Admission: RE | Admit: 2011-10-06 | Discharge: 2011-10-06 | Disposition: A | Discharge status: Home / Self Care | Payer: Medicare Other | Source: Ambulatory Visit | Attending: Orthopedic Surgery | Admitting: Orthopedic Surgery

## 2011-10-06 DIAGNOSIS — Z01812 Encounter for preprocedural laboratory examination: Secondary | ICD-10-CM | POA: Insufficient documentation

## 2011-10-06 DIAGNOSIS — Z01818 Encounter for other preprocedural examination: Secondary | ICD-10-CM | POA: Insufficient documentation

## 2011-10-06 DIAGNOSIS — Z87891 Personal history of nicotine dependence: Secondary | ICD-10-CM | POA: Insufficient documentation

## 2011-10-06 HISTORY — DX: Nausea with vomiting, unspecified: R11.2

## 2011-10-06 HISTORY — DX: Other specified postprocedural states: R11.2

## 2011-10-06 HISTORY — DX: Nausea with vomiting, unspecified: Z98.890

## 2011-10-06 LAB — URINALYSIS, ROUTINE W REFLEX MICROSCOPIC
Glucose, UA: NEGATIVE mg/dL
Hgb urine dipstick: NEGATIVE
Protein, ur: NEGATIVE mg/dL
Specific Gravity, Urine: 1.028 (ref 1.005–1.030)

## 2011-10-06 LAB — URINE MICROSCOPIC-ADD ON

## 2011-10-06 LAB — APTT: aPTT: 32 seconds (ref 24–37)

## 2011-10-06 NOTE — Patient Instructions (Signed)
22 Leah [name]  10/06/2011   Your procedure is scheduled on:  10/13/11   Monday  Surgery [id number]  Report to [facility] at  0515     AM.  Call this number if you have problems the morning of surgery: [phone]     Or PST   [id number]  [facility]   Remember:   Do not eat food or drink fluids :After Midnight. Sunday NIGHT    Clear liquids include soda, tea, black coffee, apple or grape juice, broth.  Take these medicines the morning of surgery with A SIP OF WATER:PROLISEC   Do not wear jewelry, make-up or nail polish.  Do not wear lotions, powders, or perfumes. You may wear deodorant.  Do not shave 48 hours prior to surgery.  Do not bring valuables to the hospital.  Contacts, dentures or bridgework may not be worn into surgery.  Leave suitcase in the car. After surgery it may be brought to your room.  For patients admitted to the hospital, checkout time is 11:00 AM the day of discharge.   Patients discharged the day of surgery will not be allowed to drive home.  Name and phone number of your driver:  Greggory Stallion  husband                                                                    Special Instructions: CHG Shower Use Special Wash: 1/2 bottle night before surgery and 1/2 bottle morning of surgery. REGULAR SOAP FACE AND PRIVATES              LADIES- NO SHAVING 48 HOURS BEFORE USING BETASEPT SOAP.                  Please read over the following fact sheets that you were given: MRSA Information

## 2011-10-06 NOTE — Pre-Procedure Instructions (Signed)
CBC with diff, BMET, liver panel, EKG and clearance Dr Lovell Sheehan from 09/24/11   EPIC and on chart

## 2011-10-10 ENCOUNTER — Other Ambulatory Visit: Payer: Self-pay | Admitting: Orthopedic Surgery

## 2011-10-10 ENCOUNTER — Other Ambulatory Visit: Payer: Self-pay | Admitting: Surgical

## 2011-10-10 NOTE — Progress Notes (Signed)
Preoperative surgical orders have been place into the Epic hospital system for Leah [name] on 10/10/2011, 9:41 AM  by Patrica Duel for surgery on 10/13/2011.  Preop Total Knee orders including Bupivacaine On-Q pump, IV Tylenol, and IV Decadron as long as there are no contraindications to the above medications.

## 2011-10-10 NOTE — Pre-Procedure Instructions (Signed)
LATE ENTRY for 10/08/11-  Notified Avel Peace PA that new orders placed in [facility].  10/08/11   renotified Avel Peace of no pre op orders for this patient. Spoke with Clydie Braun at [facility] of need for orders- states will will speak with a provider

## 2011-10-12 ENCOUNTER — Other Ambulatory Visit: Payer: Self-pay | Admitting: Orthopedic Surgery

## 2011-10-12 MED ORDER — BUPIVACAINE 0.25 % ON-Q PUMP SINGLE CATH 300ML
300.0000 mL | INJECTION | Status: DC
  Filled 2011-10-12: qty 300

## 2011-10-12 NOTE — Anesthesia Preprocedure Evaluation (Addendum)
Anesthesia Evaluation  Patient identified by MRN, date of birth, ID band Patient awake    Reviewed: Allergy & Precautions, H&P , NPO status , Patient's Chart, lab work & pertinent test results  History of Anesthesia Complications (+) PONV  Airway       Dental No notable dental hx. (+) Teeth Intact and Dental Advisory Given   Pulmonary neg pulmonary ROS,  breath sounds clear to auscultation  Pulmonary exam normal       Cardiovascular Exercise Tolerance: Good Rhythm:Regular Rate:Normal  Bundle branch block - left.  Has clearance.   Neuro/Psych negative neurological ROS  negative psych ROS   GI/Hepatic negative GI ROS, Neg liver ROS, GERD-  Medicated and Controlled,  Endo/Other  negative endocrine ROS  Renal/GU negative Renal ROS  negative genitourinary   Musculoskeletal   Abdominal   Peds  Hematology negative hematology ROS (+)   Anesthesia Other Findings   Reproductive/Obstetrics negative OB ROS                        Anesthesia Physical Anesthesia Plan  ASA: II  Anesthesia Plan: General   Post-op Pain Management:    Induction: Intravenous  Airway Management Planned: Oral ETT  Additional Equipment:   Intra-op Plan:   Post-operative Plan: Extubation in OR  Informed Consent: I have reviewed the patients History and Physical, chart, labs and discussed the procedure including the risks, benefits and alternatives for the proposed anesthesia with the patient or authorized representative who has indicated his/her understanding and acceptance.   Dental Advisory Given  Plan Discussed with: CRNA and Surgeon  Anesthesia Plan Comments:       Anesthesia Quick Evaluation

## 2011-10-12 NOTE — H&P (Signed)
Leah [name]  DOB: 1929/09/17  Date Of Admission:  10/13/2011  Chief Complaint:  Right Knee Pain  History of Present Illness The patient is a 78 year old female who comes in for a preoperative History and Physical. The patient is scheduled for a right total knee arthroplasty to be performed by Dr. Gus Rankin. [name], MD at [facility] on Monday Oct 13, 2011 . The patient has been seen by Dr. [name] office for about a year. She has had progressively worsening right knee pain. She is starting to have more difficulty with ambulation. Her knee is starting to give out more. The last injection did not help as much as previous ones had. She is concerned about falling now. The knee feels unstable. It also is very painful. Due to failure of conservative measures, the most predictable means for decreased pain and increased function in the right knee is a right total knee arthroplasty. Risks and benefits of the surgery discussed.  Problem List/Past Medical Osteoarthritis, knee (715.96) Osteoarthrosis NOS, pelvis/thigh (715.95) Osteoporosis, senile (733.01) Shingles Glaucoma GERD Allergic Rhinitis Bundle Branch Block Hyperlipidemia  Allergies VOLTAREN GEL ALEVE  Hives. CELEBREX ZOVIRAX Sulfanomides. Rash. TraMADol HCl ER *ANALGESICS - OPIOID*. Nausea, Vomiting. Relafen *ANALGESICS - ANTI-INFLAMMATORY*. Rash, Hives.  Family History Father. Deceased, Myocardial infarction. age 17 Mother. Deceased, Dementia. age 47 Sister 1. Deceased, Dementia. age 78  Social History Tobacco use. Former smoker. Marital status. Married. Children. 2 Living situation. Lives with spouse. Post-Surgical Plans. Plans to go home. Advance Directives. Living Will, Healthcare POA  Medication History Mobic (7.5MG  Tablet, 1 PO QD, Taken 09/28/2003 to 10/27/2003) Active. Diclofenac Sodium (1% Gel, 4 gm QID, Taken starting 01/12/2008) Active. Multiple Vitamin (1 Oral) Specific dose  unknown - Active. Vitamin D (1 Oral) Specific dose unknown - Active. Fish Oil Concentrate (1 Oral) Specific dose unknown - Active. Aspirin (81MG  Tablet, 1 Oral) Active. PriLOSEC (20MG  Capsule DR, Oral daily) Active.  Past Surgical History Total Hip Replacement - Left. Date: 11. Total Hip Replacement - Right. Date: 2004. Carpal Tunnel Surgery - Right. Date: 08/2009. Right Hip Arthroscopy  Review of Systems General:Not Present- Chills, Fever, Night Sweats, Fatigue, Weight Gain, Weight Loss and Memory Loss. Skin:Not Present- Hives, Itching, Rash, Eczema and Lesions. HEENT:Not Present- Tinnitus, Headache, Double Vision, Visual Loss, Hearing Loss and Dentures. Respiratory:Not Present- Shortness of breath with exertion, Shortness of breath at rest, Allergies, Coughing up blood and Chronic Cough. Cardiovascular:Not Present- Chest Pain, Racing/skipping heartbeats, Difficulty Breathing Lying Down, Murmur, Swelling and Palpitations. Gastrointestinal:Present- Heartburn. Not Present- Bloody Stool, Abdominal Pain, Vomiting, Nausea, Constipation, Diarrhea, Difficulty Swallowing, Jaundice and Loss of appetitie. Female Genitourinary:Not Present- Blood in Urine, Urinary frequency, Weak urinary stream, Discharge, Flank Pain, Incontinence, Painful Urination, Urgency, Urinary Retention and Urinating at Night. Musculoskeletal:Present- Joint Pain and Morning Stiffness. Not Present- Muscle Weakness, Muscle Pain, Joint Swelling, Back Pain and Spasms. Neurological:Not Present- Tremor, Dizziness, Blackout spells, Paralysis, Difficulty with balance and Weakness. Psychiatric:Not Present- Insomnia.  Vitals Weight: 145 lb Height: 63 in Body Surface Area: 1.71 m Body Mass Index: 25.69 kg/m Pulse: 76 (Regular) Resp.: 16 (Unlabored) BP: 138/64 (Sitting, Left Arm, Standard)   Physical Exam The physical exam findings are as follows:   General Mental Status - Alert, cooperative and  good historian. General Appearance- pleasant. Not in acute distress. Orientation- Oriented X3. Build & Nutrition- Well nourished and Well developed.   Head and Neck Head- normocephalic, atraumatic . Neck Global Assessment- supple. no bruit auscultated on the right and no bruit  auscultated on the left.   Eye Pupil- Bilateral- Regular and Round. Motion- Bilateral- EOMI.   ENMT Partial upper dental plate  Chest and Lung Exam Auscultation: Breath sounds:- clear at anterior chest wall and - clear at posterior chest wall. Adventitious sounds:- No Adventitious sounds.   Cardiovascular Auscultation:Rhythm- Regular rate and rhythm. Heart Sounds- S1 WNL and S2 WNL. Murmurs & Other Heart Sounds:Auscultation of the heart reveals - No Murmurs.   Abdomen Palpation/Percussion:Tenderness- Abdomen is non-tender to palpation. Rigidity (guarding)- Abdomen is soft. Auscultation:Auscultation of the abdomen reveals - Bowel sounds normal.   Female Genitourinary Not done, not pertinent to present illness  Peripheral Vascular Upper Extremity: Palpation:- Pulses bilaterally normal. Lower Extremity: Palpation:- Pulses bilaterally normal.   Neurologic Examination of related systems reveals - normal muscle strength and tone in all extremities. Neurologic evaluation reveals - normal sensation and upper and lower extremity deep tendon reflexes intact bilaterally .   Musculoskeletal Her right knee shows no effusion. She has a valgus deformity. She is tender lateral greater than medial. Range of motion is about 5 to 120. There is no instability.   RADIOGRAPHS: AP and lateral of the knees show endstage arthritis with lateral bone on bone as well as some medial narrowing and patellofemoral bone on bone.  Assessment & Plan Osteoarthritis Right Knee  Patient is for a Right Total Knee Replacement by Dr. [name].  Plan is to go home.  Patient has used [facility] in the past and wwould like to use them for her postop home health therapy.  PCP - Dr. [name] - Patient has been seen preoperatively by Dr. Lovell Sheehan and felt to be stable for surgery.  Avel Peace, PA-C

## 2011-10-13 ENCOUNTER — Ambulatory Visit (HOSPITAL_COMMUNITY): Payer: Medicare Other | Admitting: Anesthesiology

## 2011-10-13 ENCOUNTER — Encounter (HOSPITAL_COMMUNITY): Payer: Self-pay | Admitting: Anesthesiology

## 2011-10-13 ENCOUNTER — Encounter (HOSPITAL_COMMUNITY): Payer: Self-pay | Admitting: *Deleted

## 2011-10-13 ENCOUNTER — Inpatient Hospital Stay (HOSPITAL_COMMUNITY)
Admission: RE | Admit: 2011-10-13 | Discharge: 2011-10-16 | DRG: 470 | Disposition: A | Discharge status: Home Health | Payer: Medicare Other | Source: Ambulatory Visit | Attending: Orthopedic Surgery | Admitting: Orthopedic Surgery

## 2011-10-13 ENCOUNTER — Encounter (HOSPITAL_COMMUNITY)
Admission: RE | Disposition: A | Discharge status: Home Health | Payer: Self-pay | Source: Ambulatory Visit | Attending: Orthopedic Surgery

## 2011-10-13 ENCOUNTER — Encounter (HOSPITAL_COMMUNITY): Payer: Self-pay

## 2011-10-13 DIAGNOSIS — Z96659 Presence of unspecified artificial knee joint: Secondary | ICD-10-CM

## 2011-10-13 DIAGNOSIS — D62 Acute posthemorrhagic anemia: Secondary | ICD-10-CM | POA: Diagnosis not present

## 2011-10-13 DIAGNOSIS — K219 Gastro-esophageal reflux disease without esophagitis: Secondary | ICD-10-CM | POA: Diagnosis present

## 2011-10-13 DIAGNOSIS — M1710 Unilateral primary osteoarthritis, unspecified knee: Principal | ICD-10-CM | POA: Diagnosis present

## 2011-10-13 HISTORY — PX: TOTAL KNEE ARTHROPLASTY: SHX125

## 2011-10-13 LAB — TYPE AND SCREEN
ABO/RH(D): O POS
Antibody Screen: NEGATIVE

## 2011-10-13 SURGERY — ARTHROPLASTY, KNEE, TOTAL
Anesthesia: Spinal | Site: Knee | Laterality: Right | Wound class: Clean

## 2011-10-13 MED ORDER — ACETAMINOPHEN 10 MG/ML IV SOLN
INTRAVENOUS | Status: AC
  Filled 2011-10-13: qty 100

## 2011-10-13 MED ORDER — ONDANSETRON HCL 4 MG/2ML IJ SOLN: 4.0000 mg | Freq: Four times a day (QID) | INTRAMUSCULAR | Status: DC | PRN

## 2011-10-13 MED ORDER — HYDROMORPHONE HCL PF 1 MG/ML IJ SOLN
INTRAMUSCULAR | Status: AC
  Filled 2011-10-13: qty 1

## 2011-10-13 MED ORDER — PHENOL 1.4 % MT LIQD
1.0000 | OROMUCOSAL | Status: DC | PRN
  Filled 2011-10-13: qty 177

## 2011-10-13 MED ORDER — LATANOPROST 0.005 % OP SOLN
1.0000 [drp] | Freq: Every day | OPHTHALMIC | Status: DC
  Administered 2011-10-14 – 2011-10-15 (×2): 1 [drp] via OPHTHALMIC
  Filled 2011-10-13: qty 2.5

## 2011-10-13 MED ORDER — KETAMINE HCL 10 MG/ML IJ SOLN
INTRAMUSCULAR | Status: DC | PRN
  Administered 2011-10-13: 30 mg via INTRAVENOUS
  Administered 2011-10-13 (×2): 10 mg via INTRAVENOUS

## 2011-10-13 MED ORDER — FLEET ENEMA 7-19 GM/118ML RE ENEM: 1.0000 | ENEMA | Freq: Once | RECTAL | Status: AC | PRN

## 2011-10-13 MED ORDER — LIDOCAINE HCL (CARDIAC) 20 MG/ML IV SOLN
INTRAVENOUS | Status: DC | PRN
  Administered 2011-10-13: 60 mg via INTRAVENOUS

## 2011-10-13 MED ORDER — MORPHINE SULFATE (PF) 1 MG/ML IV SOLN: INTRAVENOUS | Status: DC

## 2011-10-13 MED ORDER — DIPHENHYDRAMINE HCL 12.5 MG/5ML PO ELIX
12.5000 mg | ORAL_SOLUTION | Freq: Four times a day (QID) | ORAL | Status: DC | PRN
  Filled 2011-10-13: qty 5

## 2011-10-13 MED ORDER — MORPHINE SULFATE (PF) 1 MG/ML IV SOLN
INTRAVENOUS | Status: DC
  Administered 2011-10-13: 2 mg via INTRAVENOUS
  Administered 2011-10-13 – 2011-10-14 (×2): 4 mg via INTRAVENOUS

## 2011-10-13 MED ORDER — PANTOPRAZOLE SODIUM 40 MG PO TBEC
40.0000 mg | DELAYED_RELEASE_TABLET | Freq: Every day | ORAL | Status: DC
  Administered 2011-10-13: 40 mg via ORAL
  Filled 2011-10-13 (×2): qty 1

## 2011-10-13 MED ORDER — GLYCOPYRROLATE 0.2 MG/ML IJ SOLN
INTRAMUSCULAR | Status: DC | PRN
  Administered 2011-10-13: .2 mg via INTRAVENOUS

## 2011-10-13 MED ORDER — DEXAMETHASONE SODIUM PHOSPHATE 4 MG/ML IJ SOLN
INTRAMUSCULAR | Status: DC | PRN
  Administered 2011-10-13: 6 mg via INTRAVENOUS

## 2011-10-13 MED ORDER — METHOCARBAMOL 100 MG/ML IJ SOLN
500.0000 mg | Freq: Four times a day (QID) | INTRAVENOUS | Status: DC | PRN
  Administered 2011-10-13 – 2011-10-15 (×2): 500 mg via INTRAVENOUS
  Filled 2011-10-13 (×2): qty 5

## 2011-10-13 MED ORDER — SODIUM CHLORIDE 0.9 % IV SOLN: INTRAVENOUS | Status: DC

## 2011-10-13 MED ORDER — BUPIVACAINE 0.25 % ON-Q PUMP SINGLE CATH 300ML
INJECTION | Status: DC | PRN
  Administered 2011-10-13: 300 mL

## 2011-10-13 MED ORDER — DIPHENHYDRAMINE HCL 12.5 MG/5ML PO ELIX: 12.5000 mg | ORAL_SOLUTION | ORAL | Status: DC | PRN

## 2011-10-13 MED ORDER — ACETAMINOPHEN 325 MG PO TABS
650.0000 mg | ORAL_TABLET | Freq: Four times a day (QID) | ORAL | Status: DC | PRN
  Administered 2011-10-15: 650 mg via ORAL
  Filled 2011-10-13: qty 2

## 2011-10-13 MED ORDER — ACETAMINOPHEN 10 MG/ML IV SOLN
INTRAVENOUS | Status: DC | PRN
  Administered 2011-10-13: 1000 mg via INTRAVENOUS

## 2011-10-13 MED ORDER — EPHEDRINE SULFATE 50 MG/ML IJ SOLN
INTRAMUSCULAR | Status: DC | PRN
  Administered 2011-10-13: 10 mg via INTRAVENOUS

## 2011-10-13 MED ORDER — LACTATED RINGERS IV SOLN: INTRAVENOUS | Status: DC

## 2011-10-13 MED ORDER — TEMAZEPAM 15 MG PO CAPS: 15.0000 mg | ORAL_CAPSULE | Freq: Every evening | ORAL | Status: DC | PRN

## 2011-10-13 MED ORDER — ACETAMINOPHEN 10 MG/ML IV SOLN: 1000.0000 mg | Freq: Once | INTRAVENOUS | Status: DC

## 2011-10-13 MED ORDER — DIPHENHYDRAMINE HCL 12.5 MG/5ML PO ELIX: 12.5000 mg | ORAL_SOLUTION | Freq: Four times a day (QID) | ORAL | Status: DC | PRN

## 2011-10-13 MED ORDER — METHOCARBAMOL 500 MG PO TABS
500.0000 mg | ORAL_TABLET | Freq: Four times a day (QID) | ORAL | Status: DC | PRN
  Administered 2011-10-14 – 2011-10-15 (×4): 500 mg via ORAL
  Filled 2011-10-13 (×4): qty 1

## 2011-10-13 MED ORDER — MENTHOL 3 MG MT LOZG
1.0000 | LOZENGE | OROMUCOSAL | Status: DC | PRN
  Filled 2011-10-13: qty 9

## 2011-10-13 MED ORDER — RIVAROXABAN 10 MG PO TABS
10.0000 mg | ORAL_TABLET | Freq: Every day | ORAL | Status: DC
  Administered 2011-10-14 – 2011-10-16 (×3): 10 mg via ORAL
  Filled 2011-10-13 (×4): qty 1

## 2011-10-13 MED ORDER — CISATRACURIUM BESYLATE (PF) 10 MG/5ML IV SOLN
INTRAVENOUS | Status: DC | PRN
  Administered 2011-10-13: 8 mg via INTRAVENOUS

## 2011-10-13 MED ORDER — DIPHENHYDRAMINE HCL 50 MG/ML IJ SOLN: 12.5000 mg | Freq: Four times a day (QID) | INTRAMUSCULAR | Status: DC | PRN

## 2011-10-13 MED ORDER — CEFAZOLIN SODIUM-DEXTROSE 2-3 GM-% IV SOLR
2.0000 g | INTRAVENOUS | Status: AC
  Administered 2011-10-13: 2 g via INTRAVENOUS

## 2011-10-13 MED ORDER — DEXTROSE-NACL 5-0.45 % IV SOLN
INTRAVENOUS | Status: DC
  Administered 2011-10-13 – 2011-10-14 (×3): via INTRAVENOUS
  Filled 2011-10-13: qty 1000

## 2011-10-13 MED ORDER — HYDROMORPHONE HCL PF 1 MG/ML IJ SOLN
0.2500 mg | INTRAMUSCULAR | Status: DC | PRN
  Administered 2011-10-13 (×4): 0.5 mg via INTRAVENOUS

## 2011-10-13 MED ORDER — ACETAMINOPHEN 650 MG RE SUPP: 650.0000 mg | Freq: Four times a day (QID) | RECTAL | Status: DC | PRN

## 2011-10-13 MED ORDER — NEOSTIGMINE METHYLSULFATE 1 MG/ML IJ SOLN
INTRAMUSCULAR | Status: DC | PRN
  Administered 2011-10-13: 2 mg via INTRAVENOUS

## 2011-10-13 MED ORDER — MORPHINE SULFATE (PF) 1 MG/ML IV SOLN
INTRAVENOUS | Status: DC
  Administered 2011-10-13: 09:00:00 via INTRAVENOUS

## 2011-10-13 MED ORDER — 0.9 % SODIUM CHLORIDE (POUR BTL) OPTIME
TOPICAL | Status: DC | PRN
  Administered 2011-10-13: 1000 mL

## 2011-10-13 MED ORDER — BISACODYL 10 MG RE SUPP: 10.0000 mg | Freq: Every day | RECTAL | Status: DC | PRN

## 2011-10-13 MED ORDER — ONDANSETRON HCL 4 MG/2ML IJ SOLN
4.0000 mg | Freq: Four times a day (QID) | INTRAMUSCULAR | Status: DC | PRN
  Administered 2011-10-15: 4 mg via INTRAVENOUS
  Filled 2011-10-13 (×2): qty 2

## 2011-10-13 MED ORDER — NALOXONE HCL 0.4 MG/ML IJ SOLN: 0.4000 mg | INTRAMUSCULAR | Status: DC | PRN

## 2011-10-13 MED ORDER — ZOLPIDEM TARTRATE 5 MG PO TABS: 5.0000 mg | ORAL_TABLET | Freq: Every evening | ORAL | Status: DC | PRN

## 2011-10-13 MED ORDER — SUFENTANIL CITRATE 50 MCG/ML IV SOLN
INTRAVENOUS | Status: DC | PRN
  Administered 2011-10-13: 10 ug via INTRAVENOUS
  Administered 2011-10-13: 15 ug via INTRAVENOUS
  Administered 2011-10-13 (×2): 10 ug via INTRAVENOUS
  Administered 2011-10-13: 5 ug via INTRAVENOUS

## 2011-10-13 MED ORDER — DOCUSATE SODIUM 100 MG PO CAPS
100.0000 mg | ORAL_CAPSULE | Freq: Two times a day (BID) | ORAL | Status: DC
  Administered 2011-10-13 – 2011-10-16 (×6): 100 mg via ORAL

## 2011-10-13 MED ORDER — SODIUM CHLORIDE 0.9 % IR SOLN
Status: DC | PRN
  Administered 2011-10-13: 3000 mL

## 2011-10-13 MED ORDER — METOCLOPRAMIDE HCL 5 MG/ML IJ SOLN: 5.0000 mg | Freq: Three times a day (TID) | INTRAMUSCULAR | Status: DC | PRN

## 2011-10-13 MED ORDER — SODIUM CHLORIDE 0.9 % IJ SOLN: 9.0000 mL | INTRAMUSCULAR | Status: DC | PRN

## 2011-10-13 MED ORDER — LACTATED RINGERS IV SOLN
INTRAVENOUS | Status: DC | PRN
  Administered 2011-10-13 (×2): via INTRAVENOUS

## 2011-10-13 MED ORDER — DEXAMETHASONE SODIUM PHOSPHATE 10 MG/ML IJ SOLN: 10.0000 mg | Freq: Once | INTRAMUSCULAR | Status: DC

## 2011-10-13 MED ORDER — POLYETHYLENE GLYCOL 3350 17 G PO PACK: 17.0000 g | PACK | Freq: Every day | ORAL | Status: DC | PRN

## 2011-10-13 MED ORDER — MORPHINE SULFATE (PF) 1 MG/ML IV SOLN
INTRAVENOUS | Status: AC
  Filled 2011-10-13: qty 25

## 2011-10-13 MED ORDER — BUPIVACAINE ON-Q PAIN PUMP (FOR ORDER SET NO CHG)
INJECTION | Status: DC
  Filled 2011-10-13: qty 1

## 2011-10-13 MED ORDER — ACETAMINOPHEN 10 MG/ML IV SOLN
1000.0000 mg | Freq: Four times a day (QID) | INTRAVENOUS | Status: AC
  Administered 2011-10-13 – 2011-10-14 (×4): 1000 mg via INTRAVENOUS
  Filled 2011-10-13 (×3): qty 100

## 2011-10-13 MED ORDER — CEFAZOLIN SODIUM-DEXTROSE 2-3 GM-% IV SOLR
INTRAVENOUS | Status: AC
  Filled 2011-10-13: qty 50

## 2011-10-13 MED ORDER — PROPOFOL 10 MG/ML IV EMUL
INTRAVENOUS | Status: DC | PRN
  Administered 2011-10-13: 130 mg via INTRAVENOUS

## 2011-10-13 MED ORDER — OXYCODONE HCL 5 MG PO TABS
5.0000 mg | ORAL_TABLET | ORAL | Status: DC | PRN
  Administered 2011-10-14: 5 mg via ORAL
  Administered 2011-10-14 (×2): 10 mg via ORAL
  Administered 2011-10-14: 5 mg via ORAL
  Administered 2011-10-15 – 2011-10-16 (×6): 10 mg via ORAL
  Filled 2011-10-13 (×6): qty 2
  Filled 2011-10-13: qty 1
  Filled 2011-10-13 (×3): qty 2
  Filled 2011-10-13: qty 1

## 2011-10-13 MED ORDER — CEFAZOLIN SODIUM 1-5 GM-% IV SOLN
1.0000 g | Freq: Four times a day (QID) | INTRAVENOUS | Status: AC
  Administered 2011-10-13 – 2011-10-14 (×3): 1 g via INTRAVENOUS
  Filled 2011-10-13 (×3): qty 50

## 2011-10-13 MED ORDER — ONDANSETRON HCL 4 MG PO TABS: 4.0000 mg | ORAL_TABLET | Freq: Four times a day (QID) | ORAL | Status: DC | PRN

## 2011-10-13 MED ORDER — ONDANSETRON HCL 4 MG/2ML IJ SOLN
INTRAMUSCULAR | Status: DC | PRN
  Administered 2011-10-13: 4 mg via INTRAVENOUS

## 2011-10-13 MED ORDER — METOCLOPRAMIDE HCL 10 MG PO TABS: 5.0000 mg | ORAL_TABLET | Freq: Three times a day (TID) | ORAL | Status: DC | PRN

## 2011-10-13 SURGICAL SUPPLY — 57 items
BAG SPEC THK2 15X12 ZIP CLS (MISCELLANEOUS) ×1
BAG ZIPLOCK 12X15 (MISCELLANEOUS) ×2 IMPLANT
BANDAGE ELASTIC 6 VELCRO ST LF (GAUZE/BANDAGES/DRESSINGS) ×2 IMPLANT
BANDAGE ESMARK 6X9 LF (GAUZE/BANDAGES/DRESSINGS) ×1 IMPLANT
BLADE SAG 18X100X1.27 (BLADE) ×2 IMPLANT
BLADE SAW SGTL 11.0X1.19X90.0M (BLADE) ×2 IMPLANT
BNDG CMPR 9X6 STRL LF SNTH (GAUZE/BANDAGES/DRESSINGS) ×1
BNDG ESMARK 6X9 LF (GAUZE/BANDAGES/DRESSINGS) ×2
BOWL SMART MIX CTS (DISPOSABLE) ×2 IMPLANT
CATH KIT ON-Q SILVERSOAK 5 (CATHETERS) ×1 IMPLANT
CATH KIT ON-Q SILVERSOAK 5IN (CATHETERS) ×2 IMPLANT
CEMENT HV SMART SET (Cement) ×4 IMPLANT
CLOTH BEACON ORANGE TIMEOUT ST (SAFETY) ×2 IMPLANT
CUFF TOURN SGL QUICK 34 (TOURNIQUET CUFF) ×2
CUFF TRNQT CYL 34X4X40X1 (TOURNIQUET CUFF) ×1 IMPLANT
DRAPE EXTREMITY T 121X128X90 (DRAPE) ×2 IMPLANT
DRAPE POUCH INSTRU U-SHP 10X18 (DRAPES) ×2 IMPLANT
DRAPE U-SHAPE 47X51 STRL (DRAPES) ×2 IMPLANT
DRSG ADAPTIC 3X8 NADH LF (GAUZE/BANDAGES/DRESSINGS) ×2 IMPLANT
DRSG PAD ABDOMINAL 8X10 ST (GAUZE/BANDAGES/DRESSINGS) ×1 IMPLANT
DURAPREP 26ML APPLICATOR (WOUND CARE) ×2 IMPLANT
ELECT REM PT RETURN 9FT ADLT (ELECTROSURGICAL) ×2
ELECTRODE REM PT RTRN 9FT ADLT (ELECTROSURGICAL) ×1 IMPLANT
EVACUATOR 1/8 PVC DRAIN (DRAIN) ×2 IMPLANT
FACESHIELD LNG OPTICON STERILE (SAFETY) ×10 IMPLANT
GAUZE XEROFORM 1X8 LF (GAUZE/BANDAGES/DRESSINGS) ×1 IMPLANT
GLOVE BIO SURGEON STRL SZ7.5 (GLOVE) ×2 IMPLANT
GLOVE BIO SURGEON STRL SZ8 (GLOVE) ×2 IMPLANT
GLOVE BIOGEL PI IND STRL 8 (GLOVE) ×2 IMPLANT
GLOVE BIOGEL PI INDICATOR 8 (GLOVE) ×2
GOWN STRL NON-REIN LRG LVL3 (GOWN DISPOSABLE) ×2 IMPLANT
GOWN STRL REIN XL XLG (GOWN DISPOSABLE) ×2 IMPLANT
HANDPIECE INTERPULSE COAX TIP (DISPOSABLE) ×2
IMMOBILIZER KNEE 20 (SOFTGOODS) ×2
IMMOBILIZER KNEE 20 THIGH 36 (SOFTGOODS) ×1 IMPLANT
KIT BASIN OR (CUSTOM PROCEDURE TRAY) ×2 IMPLANT
MANIFOLD NEPTUNE II (INSTRUMENTS) ×2 IMPLANT
NS IRRIG 1000ML POUR BTL (IV SOLUTION) ×2 IMPLANT
PACK TOTAL JOINT (CUSTOM PROCEDURE TRAY) ×2 IMPLANT
PAD ABD 7.5X8 STRL (GAUZE/BANDAGES/DRESSINGS) ×2 IMPLANT
PADDING CAST ABS 6INX4YD NS (CAST SUPPLIES) ×1
PADDING CAST ABS COTTON 6X4 NS (CAST SUPPLIES) IMPLANT
PADDING CAST COTTON 6X4 STRL (CAST SUPPLIES) ×6 IMPLANT
POSITIONER SURGICAL ARM (MISCELLANEOUS) ×2 IMPLANT
SET HNDPC FAN SPRY TIP SCT (DISPOSABLE) ×1 IMPLANT
SPONGE GAUZE 4X4 12PLY (GAUZE/BANDAGES/DRESSINGS) ×2 IMPLANT
STRIP CLOSURE SKIN 1/2X4 (GAUZE/BANDAGES/DRESSINGS) ×4 IMPLANT
SUCTION FRAZIER 12FR DISP (SUCTIONS) ×2 IMPLANT
SUT MNCRL AB 4-0 PS2 18 (SUTURE) ×2 IMPLANT
SUT PDS AB 1 CT1 27 (SUTURE) ×6 IMPLANT
SUT VIC AB 2-0 CT1 27 (SUTURE) ×6
SUT VIC AB 2-0 CT1 TAPERPNT 27 (SUTURE) ×3 IMPLANT
SUT VLOC 180 0 24IN GS25 (SUTURE) ×2 IMPLANT
TOWEL OR 17X26 10 PK STRL BLUE (TOWEL DISPOSABLE) ×4 IMPLANT
TRAY FOLEY CATH 14FRSI W/METER (CATHETERS) ×2 IMPLANT
WATER STERILE IRR 1500ML POUR (IV SOLUTION) ×2 IMPLANT
WRAP KNEE MAXI GEL POST OP (GAUZE/BANDAGES/DRESSINGS) ×4 IMPLANT

## 2011-10-13 NOTE — Anesthesia Procedure Notes (Signed)
Date/Time: 10/13/2011 7:25 AM Performed by: Leroy Libman L Patient Re-evaluated:Patient Re-evaluated prior to inductionOxygen Delivery Method: Circle system utilized Preoxygenation: Pre-oxygenation with 100% oxygen Intubation Type: IV induction Ventilation: Mask ventilation without difficulty and Oral airway inserted - appropriate to patient size Laryngoscope Size: Hyacinth Meeker and 2 Grade View: Grade II Tube type: Oral Tube size: 7.5 mm Number of attempts: 1 Airway Equipment and Method: Stylet Placement Confirmation: ETT inserted through vocal cords under direct vision,  breath sounds checked- equal and bilateral and positive ETCO2 Secured at: 20 cm Tube secured with: Tape Dental Injury: Teeth and Oropharynx as per pre-operative assessment

## 2011-10-13 NOTE — H&P (View-Only) (Signed)
Leah [name]  DOB: 03/23/1930  Date Of Admission:  10/13/2011  Chief Complaint:  Right Knee Pain  History of Present Illness The patient is a 78 year old female who comes in for a preoperative History and Physical. The patient is scheduled for a right total knee arthroplasty to be performed by Dr. Frank V. [name], MD at [facility] on Monday Oct 13, 2011 . The patient has been seen by Dr. [name]'s office for about a year. She has had progressively worsening right knee pain. She is starting to have more difficulty with ambulation. Her knee is starting to give out more. The last injection did not help as much as previous ones had. She is concerned about falling now. The knee feels unstable. It also is very painful. Due to failure of conservative measures, the most predictable means for decreased pain and increased function in the right knee is a right total knee arthroplasty. Risks and benefits of the surgery discussed.  Problem List/Past Medical Osteoarthritis, knee (715.96) Osteoarthrosis NOS, pelvis/thigh (715.95) Osteoporosis, senile (733.01) Shingles Glaucoma GERD Allergic Rhinitis Bundle Branch Block Hyperlipidemia  Allergies VOLTAREN GEL ALEVE  Hives. CELEBREX ZOVIRAX Sulfanomides. Rash. TraMADol HCl ER *ANALGESICS - OPIOID*. Nausea, Vomiting. Relafen *ANALGESICS - ANTI-INFLAMMATORY*. Rash, Hives.  Family History Father. Deceased, Myocardial infarction. age 56 Mother. Deceased, Dementia. age 81 Sister 1. Deceased, Dementia. age 84  Social History Tobacco use. Former smoker. Marital status. Married. Children. 2 Living situation. Lives with spouse. Post-Surgical Plans. Plans to go home. Advance Directives. Living Will, Healthcare POA  Medication History Mobic (7.5MG Tablet, 1 PO QD, Taken 09/28/2003 to 10/27/2003) Active. Diclofenac Sodium (1% Gel, 4 gm QID, Taken starting 01/12/2008) Active. Multiple Vitamin (1 Oral) Specific dose  unknown - Active. Vitamin D (1 Oral) Specific dose unknown - Active. Fish Oil Concentrate (1 Oral) Specific dose unknown - Active. Aspirin (81MG Tablet, 1 Oral) Active. PriLOSEC (20MG Capsule DR, Oral daily) Active.  Past Surgical History Total Hip Replacement - Left. Date: 1994. Total Hip Replacement - Right. Date: 2004. Carpal Tunnel Surgery - Right. Date: 08/2009. Right Hip Arthroscopy  Review of Systems General:Not Present- Chills, Fever, Night Sweats, Fatigue, Weight Gain, Weight Loss and Memory Loss. Skin:Not Present- Hives, Itching, Rash, Eczema and Lesions. HEENT:Not Present- Tinnitus, Headache, Double Vision, Visual Loss, Hearing Loss and Dentures. Respiratory:Not Present- Shortness of breath with exertion, Shortness of breath at rest, Allergies, Coughing up blood and Chronic Cough. Cardiovascular:Not Present- Chest Pain, Racing/skipping heartbeats, Difficulty Breathing Lying Down, Murmur, Swelling and Palpitations. Gastrointestinal:Present- Heartburn. Not Present- Bloody Stool, Abdominal Pain, Vomiting, Nausea, Constipation, Diarrhea, Difficulty Swallowing, Jaundice and Loss of appetitie. Female Genitourinary:Not Present- Blood in Urine, Urinary frequency, Weak urinary stream, Discharge, Flank Pain, Incontinence, Painful Urination, Urgency, Urinary Retention and Urinating at Night. Musculoskeletal:Present- Joint Pain and Morning Stiffness. Not Present- Muscle Weakness, Muscle Pain, Joint Swelling, Back Pain and Spasms. Neurological:Not Present- Tremor, Dizziness, Blackout spells, Paralysis, Difficulty with balance and Weakness. Psychiatric:Not Present- Insomnia.  Vitals Weight: 145 lb Height: 63 in Body Surface Area: 1.71 m Body Mass Index: 25.69 kg/m Pulse: 76 (Regular) Resp.: 16 (Unlabored) BP: 138/64 (Sitting, Left Arm, Standard)   Physical Exam The physical exam findings are as follows:   General Mental Status - Alert, cooperative and  good historian. General Appearance- pleasant. Not in acute distress. Orientation- Oriented X3. Build & Nutrition- Well nourished and Well developed.   Head and Neck Head- normocephalic, atraumatic . Neck Global Assessment- supple. no bruit auscultated on the right and no bruit   auscultated on the left.   Eye Pupil- Bilateral- Regular and Round. Motion- Bilateral- EOMI.   ENMT Partial upper dental plate  Chest and Lung Exam Auscultation: Breath sounds:- clear at anterior chest wall and - clear at posterior chest wall. Adventitious sounds:- No Adventitious sounds.   Cardiovascular Auscultation:Rhythm- Regular rate and rhythm. Heart Sounds- S1 WNL and S2 WNL. Murmurs & Other Heart Sounds:Auscultation of the heart reveals - No Murmurs.   Abdomen Palpation/Percussion:Tenderness- Abdomen is non-tender to palpation. Rigidity (guarding)- Abdomen is soft. Auscultation:Auscultation of the abdomen reveals - Bowel sounds normal.   Female Genitourinary Not done, not pertinent to present illness  Peripheral Vascular Upper Extremity: Palpation:- Pulses bilaterally normal. Lower Extremity: Palpation:- Pulses bilaterally normal.   Neurologic Examination of related systems reveals - normal muscle strength and tone in all extremities. Neurologic evaluation reveals - normal sensation and upper and lower extremity deep tendon reflexes intact bilaterally .   Musculoskeletal Her right knee shows no effusion. She has a valgus deformity. She is tender lateral greater than medial. Range of motion is about 5 to 120. There is no instability.   RADIOGRAPHS: AP and lateral of the knees show endstage arthritis with lateral bone on bone as well as some medial narrowing and patellofemoral bone on bone.  Assessment & Plan Osteoarthritis Right Knee  Patient is for a Right Total Knee Replacement by Dr. [name].  Plan is to go home.  Patient has used [facility] in the past and wwould like to use them for her postop home health therapy.  PCP - Dr. John Jenkins - Patient has been seen preoperatively by Dr. Jenkins and felt to be stable for surgery.  Drew Rolena Knutson, PA-C  

## 2011-10-13 NOTE — Anesthesia Postprocedure Evaluation (Signed)
  Anesthesia Post-op Note  Patient: Leah [name]  Procedure(s) Performed: Procedure(s) (LRB): TOTAL KNEE ARTHROPLASTY (Right)  Patient Location: PACU  Anesthesia Type: General  Level of Consciousness: awake and alert   Airway and Oxygen Therapy: Patient Spontanous Breathing  Post-op Pain: mild  Post-op Assessment: Post-op Vital signs reviewed, Patient's Cardiovascular Status Stable, Respiratory Function Stable, Patent Airway and No signs of Nausea or vomiting  Post-op Vital Signs: stable  Complications: No apparent anesthesia complications

## 2011-10-13 NOTE — Addendum Note (Signed)
Addendum  created 10/13/11 1017 by Florene Route, CRNA   Modules edited:Anesthesia Medication Administration

## 2011-10-13 NOTE — Progress Notes (Signed)
Utilization review completed.  

## 2011-10-13 NOTE — Interval H&P Note (Signed)
History and Physical Interval Note:  10/13/2011 6:52 AM  Leah [name]  has presented today for surgery, with the diagnosis of Osteoarthritis of the Right Knee  The various methods of treatment have been discussed with the patient and family. After consideration of risks, benefits and other options for treatment, the patient has consented to  Procedure(s) (LRB): TOTAL KNEE ARTHROPLASTY (Right) as a surgical intervention .  The patients' history has been reviewed, patient examined, no change in status, stable for surgery.  I have reviewed the patients' chart and labs.  Questions were answered to the patient's satisfaction.     Loanne Drilling

## 2011-10-13 NOTE — Preoperative (Signed)
Beta Blockers   Reason not to administer Beta Blockers:Not Applicable 

## 2011-10-13 NOTE — Transfer of Care (Signed)
Immediate Anesthesia Transfer of Care Note  Patient: Leah [name]  Procedure(s) Performed: Procedure(s) (LRB): TOTAL KNEE ARTHROPLASTY (Right)  Patient Location: PACU  Anesthesia Type: General  Level of Consciousness: sedated  Airway & Oxygen Therapy: Patient Spontanous Breathing and Patient connected to face mask oxygen  Post-op Assessment: Report given to PACU RN and Post -op Vital signs reviewed and stable  Post vital signs: Reviewed and stable  Complications: No apparent anesthesia complications

## 2011-10-13 NOTE — Progress Notes (Signed)
Dr. Leta Jungling in- made aware of patient's blood pressures.

## 2011-10-13 NOTE — Addendum Note (Signed)
Addendum  created 10/13/11 1157 by Florene Route, CRNA   Modules edited:Anesthesia Flowsheet

## 2011-10-13 NOTE — Op Note (Signed)
Pre-operative diagnosis- Osteoarthritis  Right knee(s)  Post-operative diagnosis- Osteoarthritis Right knee(s)  Procedure-  Right  Total Knee Arthroplasty  Surgeon- Gus Rankin. Minard Millirons, MD  Assistant- Dimitri Ped, PA-C   Anesthesia-  General EBL-* No blood loss amount entered *  Drains Hemovac  Tourniquet time-  Total Tourniquet Time Documented: Thigh (Right) - 32 minutes   Complications- None  Condition-PACU - hemodynamically stable.   Brief Clinical Note  AALAYA [name] is a 78 y.o. year old female with end stage OA of her right knee with progressively worsening pain and dysfunction. She has constant pain, with activity and at rest and significant functional deficits with difficulties even with ADLs. She has had extensive non-op management including analgesics, injections of cortisone and viscosupplements, and home exercise program, but remains in significant pain with significant dysfunction.Radiographs show bone on bone arthritis all three compartments with large osteophyte formation. She presents now for left Total Knee Arthroplasty.    Procedure in detail---   The patient is brought into the operating room and positioned supine on the operating table. After successful administration of  General,   a tourniquet is placed high on the  Right thigh(s) and the lower extremity is prepped and draped in the usual sterile fashion. Time out is performed by the operating team and then the  Right lower extremity is wrapped in Esmarch, knee flexed and the tourniquet inflated to 300 mmHg.       A midline incision is made with a ten blade through the subcutaneous tissue to the level of the extensor mechanism. A fresh blade is used to make a medial parapatellar arthrotomy. Soft tissue over the proximal medial tibia is subperiosteally elevated to the joint line with a knife and into the semimembranosus bursa with a Cobb elevator. Soft tissue over the proximal lateral tibia is elevated with  attention being paid to avoiding the patellar tendon on the tibial tubercle. The patella is everted, knee flexed 90 degrees and the ACL and PCL are removed. Findings are bone on bone all 3 compartments with large osteophytes        The drill is used to create a starting hole in the distal femur and the canal is thoroughly irrigated with sterile saline to remove the fatty contents. The 5 degree Right  valgus alignment guide is placed into the femoral canal and the distal femoral cutting block is pinned to remove 11 mm off the distal femur. Resection is made with an oscillating saw.      The tibia is subluxed forward and the menisci are removed. The extramedullary alignment guide is placed referencing proximally at the medial aspect of the tibial tubercle and distally along the second metatarsal axis and tibial crest. The block is pinned to remove 2mm off the more deficient lateral  side. Resection is made with an oscillating saw. Size 3is the most appropriate size for the tibia and the proximal tibia is prepared with the modular drill and keel punch for that size.      The femoral sizing guide is placed and size 3 is most appropriate. Rotation is marked off the epicondylar axis and confirmed by creating a rectangular flexion gap at 90 degrees. The size 3 cutting block is pinned in this rotation and the anterior, posterior and chamfer cuts are made with the oscillating saw. The intercondylar block is then placed and that cut is made.      Trial size 3 tibial component, trial size 3 posterior stabilized femur and a  12.5  mm posterior stabilized rotating platform insert trial is placed. Full extension is achieved with excellent varus/valgus and anterior/posterior balance throughout full range of motion. The patella is everted and thickness measured to be 21  mm. Free hand resection is taken to 12 mm, a 35 template is placed, lug holes are drilled, trial patella is placed, and it tracks normally. Osteophytes are  removed off the posterior femur with the trial in place. All trials are removed and the cut bone surfaces prepared with pulsatile lavage. Cement is mixed and once ready for implantation, the size 3 tibial implant, size  3 posterior stabilized femoral component, and the size 35 patella are cemented in place and the patella is held with the clamp. The trial insert is placed and the knee held in full extension. All extruded cement is removed and once the cement is hard the permanent 12.5 mm posterior stabilized rotating platform insert is placed into the tibial tray.      The wound is copiously irrigated with saline solution and the extensor mechanism closed over a hemovac drain with #1 PDS suture. The tourniquet is released for a total tourniquet time of 32  minutes. Flexion against gravity is 140 degrees and the patella tracks normally. Subcutaneous tissue is closed with 2.0 vicryl and subcuticular with running 4.0 Monocryl. The catheter for the Marcaine pain pump is placed and the pump is initiated. The incision is cleaned and dried and steri-strips and a bulky sterile dressing are applied. The limb is placed into a knee immobilizer and the patient is awakened and transported to recovery in stable condition.      Please note that a surgical assistant was a medical necessity for this procedure in order to perform it in a safe and expeditious manner. Surgical assistant was necessary to retract the ligaments and vital neurovascular structures to prevent injury to them and also necessary for proper positioning of the limb to allow for anatomic placement of the prosthesis.   Gus Rankin Lenny Bouchillon, MD    10/13/2011, 8:24 AM

## 2011-10-14 LAB — CBC
MCH: 29.3 pg (ref 26.0–34.0)
Platelets: 188 10*3/uL (ref 150–400)
RBC: 3.38 MIL/uL — ABNORMAL LOW (ref 3.87–5.11)
WBC: 8.1 10*3/uL (ref 4.0–10.5)

## 2011-10-14 LAB — BASIC METABOLIC PANEL
CO2: 26 mEq/L (ref 19–32)
Calcium: 8.4 mg/dL (ref 8.4–10.5)
Potassium: 3.6 mEq/L (ref 3.5–5.1)
Sodium: 136 mEq/L (ref 135–145)

## 2011-10-14 MED ORDER — NON FORMULARY: 20.0000 mg | Freq: Every day | Status: DC

## 2011-10-14 MED ORDER — MORPHINE SULFATE 2 MG/ML IJ SOLN
1.0000 mg | INTRAMUSCULAR | Status: DC | PRN
  Administered 2011-10-14 – 2011-10-15 (×6): 2 mg via INTRAVENOUS
  Filled 2011-10-14 (×6): qty 1

## 2011-10-14 MED ORDER — OMEPRAZOLE 20 MG PO CPDR
20.0000 mg | DELAYED_RELEASE_CAPSULE | Freq: Every day | ORAL | Status: DC
  Administered 2011-10-14 – 2011-10-16 (×3): 20 mg via ORAL
  Filled 2011-10-14 (×3): qty 1

## 2011-10-14 NOTE — Progress Notes (Signed)
Subjective: 1 Day Post-Op Procedure(s) (LRB): TOTAL KNEE ARTHROPLASTY (Right) Patient reports pain as mild.   Patient seen in rounds with Dr. Lequita [name]. Patient is well, and has had no acute complaints or problems We will start therapy today.  Plan is to go Home after hospital stay.  Objective: Vital signs in last 24 hours: Temp:  [97.1 F (36.2 C)-98.7 F (37.1 C)] 98.1 F (36.7 C) (05/14 0557) Pulse Rate:  [62-96] 62  (05/14 0557) Resp:  [12-16] 16  (05/14 0557) BP: (117-184)/(53-84) 122/73 mmHg (05/14 0557) SpO2:  [93 %-100 %] 100 % (05/14 0557) FiO2 (%):  [100 %] 100 % (05/14 0321) Weight:  [68.04 kg (150 lb)] 68.04 kg (150 lb) (05/13 1215)  Intake/Output from previous day:  Intake/Output Summary (Last 24 hours) at 10/14/11 0714 Last data filed at 10/14/11 0538  Gross per 24 hour  Intake   4636 ml  Output   2565 ml  Net   2071 ml    Intake/Output this shift:    Labs:  Basename 10/14/11 0430  HGB 9.9*    Basename 10/14/11 0430  WBC 8.1  RBC 3.38*  HCT 29.5*  PLT 188    Basename 10/14/11 0430  NA 136  K 3.6  CL 103  CO2 26  BUN 13  CREATININE 0.76  GLUCOSE 142*  CALCIUM 8.4   No results found for this basename: LABPT:2,INR:2 in the last 72 hours  EXAM General - Patient is Alert, Appropriate and Oriented Extremity - Neurovascular intact Sensation intact distally Dressing - dressing C/D/I Motor Function - intact, moving foot and toes well on exam.  Hemovac pulled without difficulty.  Past Medical History  Diagnosis Date  . GERD (gastroesophageal reflux disease)   . Acute maxillary sinusitis   . Unspecified adverse effect of unspecified drug, medicinal and biological substance   . Basal cell carcinoma of nose   . Localized osteoarthrosis not specified whether primary or secondary, lower leg   . Hyperlipidemia   . Family history of malignant neoplasm of breast   . Family history of diabetes mellitus   . Osteoporosis   . Osteopenia   . DUB  (dysfunctional uterine bleeding)   . Endometrial polyp   . Uterine prolapse   . Cystocele   . Detrusor instability   . Glaucoma   . PONV (postoperative nausea and vomiting)     19 yrs ago- states has had general anesthesia since and did well  . Carpal tunnel syndrome   . Bundle branch block, unspecified     EKG 4/13 with clearance and note that isnt new block Dr Lovell Sheehan on chart    Assessment/Plan: 1 Day Post-Op Procedure(s) (LRB): TOTAL KNEE ARTHROPLASTY (Right) Principal Problem:  *OA (osteoarthritis) of knee   Advance diet Up with therapy Continue foley due to strict I&O and urinary output monitoring Discharge home with home health  DVT Prophylaxis - Xarelto Weight-Bearing as tolerated to right leg Keep foley until tomorrow. No vaccines. D/C PCA Morphine, Change to IV push D/C O2 and Pulse OX and try on Room Air  Leah [name], Leah [name] 10/14/2011, 7:14 AM

## 2011-10-14 NOTE — Progress Notes (Signed)
Physical Therapy Treatment Patient Details Name: Leah [name] MRN: [id number] DOB: [birth year] Today's Date: 10/14/2011 Time: 4098-1191 PT Time Calculation (min): 24 min  PT Assessment / Plan / Recommendation Comments on Treatment Session  Pain limiting tolerance.  Cold packs provided.  Pt had been premedicated but RN alerted and providing additional pain MEDS    Follow Up Recommendations  Home health PT    Barriers to Discharge        Equipment Recommendations  None recommended by PT    Recommendations for Other Services OT consult  Frequency 7X/week   Plan Discharge plan remains appropriate    Precautions / Restrictions Precautions Precautions: Knee Required Braces or Orthoses: Knee Immobilizer - Right Knee Immobilizer - Right: Discontinue once straight leg raise with < 10 degree lag Restrictions Weight Bearing Restrictions: No Other Position/Activity Restrictions: WBAT   Pertinent Vitals/Pain 10/10 with activity    Mobility  Bed Mobility Bed Mobility: Sit to Supine Sit to Supine: 3: Mod assist Details for Bed Mobility Assistance: cues for sequence and for use of UEs to self assist Transfers Transfers: Stand to Sit;Sit to Stand Sit to Stand: 3: Mod assist Stand to Sit: 3: Mod assist Details for Transfer Assistance: cues for LE management and for use of UEs to self assist Ambulation/Gait Ambulation/Gait Assistance: 3: Mod assist Ambulation Distance (Feet): 5 Feet Assistive device: Rolling walker Ambulation/Gait Assistance Details: cues for sequence, stride length and position from RW Gait Pattern: Step-to pattern    Exercises     PT Diagnosis:    PT Problem List:   PT Treatment Interventions:     PT Goals Acute Rehab PT Goals PT Goal Formulation: With patient Time For Goal Achievement: 10/20/11 Potential to Achieve Goals: Good Pt will go Supine/Side to Sit: with supervision PT Goal: Supine/Side to Sit - Progress: Goal set today Pt will go Sit to  Supine/Side: with supervision PT Goal: Sit to Supine/Side - Progress: Goal set today Pt will go Sit to Stand: with supervision PT Goal: Sit to Stand - Progress: Goal set today Pt will go Stand to Sit: with supervision PT Goal: Stand to Sit - Progress: Goal set today Pt will Ambulate: >150 feet;with supervision;with rolling walker PT Goal: Ambulate - Progress: Goal set today Pt will Go Up / Down Stairs: 6-9 stairs;with min assist;with least restrictive assistive device PT Goal: Up/Down Stairs - Progress: Goal set today  Visit Information  Last PT Received On: 10/14/11 Assistance Needed: +1    Subjective Data  Subjective: I am hurting so much more than this morning   Cognition  Overall Cognitive Status: Appears within functional limits for tasks assessed/performed Arousal/Alertness: Awake/alert Orientation Level: Appears intact for tasks assessed Behavior During Session: [facility] for tasks performed    Balance     End of Session PT - End of Session Equipment Utilized During Treatment: Right knee immobilizer Activity Tolerance: Patient limited by pain Patient left: in bed;with call bell/phone within reach Nurse Communication: Mobility status    Leah [name] 10/14/2011, 3:47 PM

## 2011-10-14 NOTE — Care Management Note (Signed)
    Page 1 of 2   10/16/2011     1:51:43 PM   CARE MANAGEMENT NOTE 10/16/2011  Patient:  Leah [name], Leah [name]   Account Number:  [id number]  Date Initiated:  10/14/2011  Documentation initiated by:  Colleen Can  Subjective/Objective Assessment:   dx osteoarthritis right knee: Total knee replacemnt     Action/Plan:   CM spoke with patient. Plans are for patient to return to her home in [place] where spouse and daughter will be caregivers. Already has DME including RW. Choice of hh agencies offered. Choice is [facility]   Anticipated DC Date:  10/16/2011   Anticipated DC Plan:  HOME W HOME HEALTH SERVICES  In-house referral  Clinical Social Worker      DC Planning Services  CM consult      [facility] Choice  HOME HEALTH   Choice offered to / List presented to:  C-1 Patient   DME arranged  NA      DME agency  NA     HH arranged  HH-2 PT      HH agency  [facility]   Status of service:  Completed, signed off Medicare Important Message given?  NA - LOS <3 / Initial given by admissions (If response is "NO", the following Medicare IM given date fields will be blank) Date Medicare IM given:   Date Additional Medicare IM given:    Discharge Disposition:  HOME W HOME HEALTH SERVICES  Per UR Regulation:    If discussed at Long Length of Stay Meetings, dates discussed:    Comments:  10/16/2011 Raynelle Bring BSN CCM [phone] Pt is discharging today with [facility] care in place. HHPT services will start tomorow 10/17/2011  10/14/2011 Raynelle Bring BSN ccm [phone] List of [facility] agencies placed in shadow chart. [facility] notifed of request and can service patient.CM will follow

## 2011-10-14 NOTE — Evaluation (Signed)
Physical Therapy Evaluation Patient Details Name: Leah [name] MRN: [id number] DOB: 03-Dec-1929 Today's Date: 10/14/2011 Time: 4098-1191 PT Time Calculation (min): 39 min  PT Assessment / Plan / Recommendation Clinical Impression  Pt with R TKR presents with decreased R LE strength/ROM  and limitations in functional mobility.      PT Assessment  Patient needs continued PT services    Follow Up Recommendations  Home health PT    Barriers to Discharge        lEquipment Recommendations  None recommended by PT    Recommendations for Other Services OT consult   Frequency 7X/week    Precautions / Restrictions Precautions Precautions: Knee Required Braces or Orthoses: Knee Immobilizer - Right Knee Immobilizer - Right: Discontinue once straight leg raise with < 10 degree lag Restrictions Weight Bearing Restrictions: No Other Position/Activity Restrictions: WBAT   Pertinent Vitals/Pain 4/10 with activity; 2/10 at rest      Mobility  Bed Mobility Bed Mobility: Supine to Sit Supine to Sit: 4: Min assist Details for Bed Mobility Assistance: cues for sequence and for use of UEs to self assist Transfers Transfers: Stand to Sit;Sit to Stand Sit to Stand: 4: Min assist;3: Mod assist Stand to Sit: 4: Min assist Details for Transfer Assistance: cues for LE management and for use of UEs to self assist Ambulation/Gait Ambulation/Gait Assistance: 4: Min assist;3: Mod assist Ambulation Distance (Feet): 22 Feet Assistive device: Rolling walker Ambulation/Gait Assistance Details: cue for sequence, posture, and position from RW Gait Pattern: Step-to pattern    Exercises Total Joint Exercises Ankle Circles/Pumps: AROM;Both;10 reps;Supine Quad Sets: Both;AROM;10 reps;Supine Heel Slides: AAROM;10 reps;Right;Supine Straight Leg Raises: AAROM;10 reps;Right;Supine   PT Diagnosis: Difficulty walking  PT Problem List: Decreased strength;Decreased range of motion;Decreased activity  tolerance;Decreased mobility;Decreased knowledge of use of DME;Pain PT Treatment Interventions: DME instruction;Gait training;Stair training;Therapeutic exercise;Therapeutic activities;Functional mobility training;Patient/family education   PT Goals Acute Rehab PT Goals PT Goal Formulation: With patient Time For Goal Achievement: 10/20/11 Potential to Achieve Goals: Good Pt will go Supine/Side to Sit: with supervision PT Goal: Supine/Side to Sit - Progress: Goal set today Pt will go Sit to Supine/Side: with supervision PT Goal: Sit to Supine/Side - Progress: Goal set today Pt will go Sit to Stand: with supervision PT Goal: Sit to Stand - Progress: Goal set today Pt will go Stand to Sit: with supervision PT Goal: Stand to Sit - Progress: Goal set today Pt will Ambulate: >150 feet;with supervision;with rolling walker PT Goal: Ambulate - Progress: Goal set today Pt will Go Up / Down Stairs: 6-9 stairs;with min assist;with least restrictive assistive device (8 stairs with single rail and 4 steps with Bil rails) PT Goal: Up/Down Stairs - Progress: Goal set today  Visit Information  Last PT Received On: 10/14/11 Assistance Needed: +1    Subjective Data  Subjective: I'm ready Patient Stated Goal: Resume previous lifestyle with decreased pain   Prior Functioning  Home Living Lives With: Spouse Available Help at Discharge: Family Type of Home: House Home Access: Stairs to enter Secretary/administrator of Steps: 8 Entrance Stairs-Rails: Right Home Layout: Multi-level Alternate Level Stairs-Number of Steps: 4 Alternate Level Stairs-Rails: Right;Left;Can reach both Home Adaptive Equipment: Walker - rolling;Quad cane Prior Function Level of Independence: Independent Able to Take Stairs?: Yes Driving: Yes Communication Communication: No difficulties    Cognition  Overall Cognitive Status: Appears within functional limits for tasks assessed/performed Arousal/Alertness:  Awake/alert Orientation Level: Appears intact for tasks assessed Behavior During Session: [facility] for tasks performed  Extremity/Trunk Assessment Right Upper Extremity Assessment RUE ROM/Strength/Tone: Within functional levels Left Upper Extremity Assessment LUE ROM/Strength/Tone: WFL for tasks assessed Right Lower Extremity Assessment RLE ROM/Strength/Tone: Deficits RLE ROM/Strength/Tone Deficits: 2+/5 Quads; AAROM at knee -10 - 45 Left Lower Extremity Assessment LLE ROM/Strength/Tone: Within functional levels   Balance    End of Session PT - End of Session Equipment Utilized During Treatment: Right knee immobilizer Activity Tolerance: Patient tolerated treatment well Patient left: in chair;with family/visitor present Nurse Communication: Mobility status CPM Right Knee CPM Right Knee: Off   Payson Evrard 10/14/2011, 11:38 AM

## 2011-10-15 ENCOUNTER — Encounter (HOSPITAL_COMMUNITY): Payer: Self-pay | Admitting: Orthopedic Surgery

## 2011-10-15 LAB — CBC
HCT: 31 % — ABNORMAL LOW (ref 36.0–46.0)
MCHC: 33.5 g/dL (ref 30.0–36.0)
Platelets: 179 10*3/uL (ref 150–400)
RDW: 14.1 % (ref 11.5–15.5)

## 2011-10-15 LAB — BASIC METABOLIC PANEL
BUN: 13 mg/dL (ref 6–23)
Creatinine, Ser: 0.71 mg/dL (ref 0.50–1.10)
GFR calc Af Amer: >90 mL/min (ref >90)
GFR calc non Af Amer: 80 mL/min — ABNORMAL LOW (ref >90)
Potassium: 3.9 mEq/L (ref 3.5–5.1)

## 2011-10-15 MED ORDER — LACTATED RINGERS IV SOLN: INTRAVENOUS | Status: DC

## 2011-10-15 NOTE — Progress Notes (Signed)
Physical Therapy Treatment Patient Details Name: Leah [name] MRN: [id number] DOB: 20-Jun-1929 Today's Date: 10/15/2011 Time: 1320-1340 PT Time Calculation (min): 20 min  PT Assessment / Plan / Recommendation Comments on Treatment Session  Pt agreeable to in bed ther ex but requests break prior to attempting ambulation.    Follow Up Recommendations  Home health PT    Barriers to Discharge        Equipment Recommendations  None recommended by PT    Recommendations for Other Services OT consult  Frequency 7X/week   Plan Discharge plan remains appropriate    Precautions / Restrictions Precautions Precautions: Knee Required Braces or Orthoses: Knee Immobilizer - Right Knee Immobilizer - Right: Discontinue once straight leg raise with < 10 degree lag Restrictions Weight Bearing Restrictions: No Other Position/Activity Restrictions: WBAT   Pertinent Vitals/Pain 4/10 - RN aware    Mobility  Transfers Sit to Stand: 4: Min assist;From chair/3-in-1;With upper extremity assist Stand to Sit: 4: Min assist Details for Transfer Assistance: min cues for hand placement    Exercises Total Joint Exercises Ankle Circles/Pumps: AROM;15 reps;Supine;Both Quad Sets: AROM;15 reps;Supine;Both Heel Slides: AAROM;20 reps;Right;Supine Straight Leg Raises: AAROM;15 reps;Supine;Right   PT Diagnosis:    PT Problem List:   PT Treatment Interventions:     PT Goals Acute Rehab PT Goals PT Goal Formulation: With patient Time For Goal Achievement: 10/20/11 Potential to Achieve Goals: Good Pt will go Supine/Side to Sit: with supervision  Visit Information  Last PT Received On: 10/15/11 Assistance Needed: +1    Subjective Data      Cognition  Overall Cognitive Status: Appears within functional limits for tasks assessed/performed Arousal/Alertness: Awake/alert Orientation Level: Appears intact for tasks assessed Behavior During Session: [name] for tasks performed    Balance     End  of Session      Leah [name] 10/15/2011, 2:09 PM

## 2011-10-15 NOTE — Progress Notes (Signed)
Subjective: 2 Days Post-Op Procedure(s) (LRB): TOTAL KNEE ARTHROPLASTY (Right) Patient reports pain as mild and moderate.   Patient seen in rounds with Dr. [name]. Patient is having problems with pain in the knee, requiring pain medications.  She has had poor pain control. Encouraged to take the oral pain meds.  Work on pain control today and see how she does. Plan is to go Home after hospital stay.  Objective: Vital signs in last 24 hours: Temp:  [97.6 F (36.4 C)-98.8 F (37.1 C)] 98.8 F (37.1 C) (05/15 0530) Pulse Rate:  [70-100] 90  (05/15 0530) Resp:  [14-16] 16  (05/15 0530) BP: (121-152)/(70-76) 147/75 mmHg (05/15 0530) SpO2:  [99 %-100 %] 100 % (05/15 0530)  Intake/Output from previous day:  Intake/Output Summary (Last 24 hours) at 10/15/11 1022 Last data filed at 10/15/11 0700  Gross per 24 hour  Intake 2077.75 ml  Output   3225 ml  Net -1147.25 ml    Intake/Output this shift:    Labs:  Basename 10/15/11 0430 10/14/11 0430  HGB 10.4* 9.9*    Basename 10/15/11 0430 10/14/11 0430  WBC 8.9 8.1  RBC 3.51* 3.38*  HCT 31.0* 29.5*  PLT 179 188    Basename 10/15/11 0430 10/14/11 0430  NA 135 136  K 3.9 3.6  CL 101 103  CO2 27 26  BUN 13 13  CREATININE 0.71 0.76  GLUCOSE 119* 142*  CALCIUM 8.7 8.4   No results found for this basename: LABPT:2,INR:2 in the last 72 hours  EXAM General - Patient is Alert, Appropriate and Oriented Extremity - Neurovascular intact Sensation intact distally Dressing/Incision - clean, dry, no drainage, healing Motor Function - intact, moving foot and toes well on exam.   Past Medical History  Diagnosis Date  . GERD (gastroesophageal reflux disease)   . Acute maxillary sinusitis   . Unspecified adverse effect of unspecified drug, medicinal and biological substance   . Basal cell carcinoma of nose   . Localized osteoarthrosis not specified whether primary or secondary, lower leg   . Hyperlipidemia   . Family history of  malignant neoplasm of breast   . Family history of diabetes mellitus   . Osteoporosis   . Osteopenia   . DUB (dysfunctional uterine bleeding)   . Endometrial polyp   . Uterine prolapse   . Cystocele   . Detrusor instability   . Glaucoma   . PONV (postoperative nausea and vomiting)     19 yrs ago- states has had general anesthesia since and did well  . Carpal tunnel syndrome   . Bundle branch block, unspecified     EKG 4/13 with clearance and note that isnt new block Dr Lovell Sheehan on chart    Assessment/Plan: 2 Days Post-Op Procedure(s) (LRB): TOTAL KNEE ARTHROPLASTY (Right) Principal Problem:  *OA (osteoarthritis) of knee   Up with therapy Plan for discharge tomorrow Discharge home with home health  DVT Prophylaxis - Xarelto Weight-Bearing as tolerated to right leg  Zamiah Tollett 10/15/2011, 10:22 AM

## 2011-10-15 NOTE — Progress Notes (Signed)
Physical Therapy Treatment Patient Details Name: ERZA MOTHERSHEAD MRN: [id number] DOB: July 29, 1929 Today's Date: 10/15/2011 Time: 2952-8413 PT Time Calculation (min): 25 min  PT Assessment / Plan / Recommendation Comments on Treatment Session  Pt agreeable to ambulation only this am secondary to pain.  Ambulation limited by onset of N&V    Follow Up Recommendations  Home health PT    Barriers to Discharge        Equipment Recommendations  None recommended by PT    Recommendations for Other Services OT consult  Frequency 7X/week   Plan Discharge plan remains appropriate    Precautions / Restrictions Precautions Precautions: Knee Required Braces or Orthoses: Knee Immobilizer - Right Knee Immobilizer - Right: Discontinue once straight leg raise with < 10 degree lag Restrictions Weight Bearing Restrictions: No Other Position/Activity Restrictions: WBAT   Pertinent Vitals/Pain 5/10 with ambulation    Mobility  Bed Mobility Bed Mobility: Supine to Sit Supine to Sit: 4: Min assist Details for Bed Mobility Assistance: cues for sequence and for use of UEs to self assist Transfers Transfers: Stand to Sit;Sit to Stand Sit to Stand: 4: Min assist;From chair/3-in-1;With upper extremity assist Stand to Sit: 4: Min assist Details for Transfer Assistance: min cues for hand placement Ambulation/Gait Ambulation/Gait Assistance: 4: Min assist;3: Mod assist Ambulation Distance (Feet): 23 Feet Assistive device: Rolling walker Ambulation/Gait Assistance Details: cues for position from RW, posture and sequence.  Ltd by onset of nausea and vomitin Gait Pattern: Step-to pattern    Exercises     PT Diagnosis:    PT Problem List:   PT Treatment Interventions:     PT Goals Acute Rehab PT Goals PT Goal Formulation: With patient Time For Goal Achievement: 10/20/11 Potential to Achieve Goals: Good Pt will go Supine/Side to Sit: with supervision PT Goal: Supine/Side to Sit - Progress:  Progressing toward goal Pt will go Sit to Stand: with supervision PT Goal: Sit to Stand - Progress: Progressing toward goal Pt will go Stand to Sit: with supervision PT Goal: Stand to Sit - Progress: Progressing toward goal Pt will Ambulate: >150 feet;with supervision;with rolling walker PT Goal: Ambulate - Progress: Progressing toward goal  Visit Information  Last PT Received On: 10/15/11 Assistance Needed: +1    Subjective Data  Subjective: I am still hurting but want to get out of this bed (I am still hurting but want to get out of this bed)   Cognition  Overall Cognitive Status: Appears within functional limits for tasks assessed/performed Arousal/Alertness: Awake/alert Orientation Level: Appears intact for tasks assessed Behavior During Session: [facility] for tasks performed    Balance     End of Session PT - End of Session Equipment Utilized During Treatment: Right knee immobilizer Activity Tolerance: Patient limited by pain;Other (comment) (ltd by onset of nausea and vomiting - RN aware) Patient left: in chair;with call bell/phone within reach Nurse Communication: Mobility status    Haytham Maher 10/15/2011, 12:49 PM

## 2011-10-15 NOTE — Evaluation (Signed)
Occupational Therapy Evaluation Patient Details Name: Leah [name] MRN: [id number] DOB: November 02, 1929 Today's Date: 10/15/2011 Time: 2440-1027 OT Time Calculation (min): 22 min  OT Assessment / Plan / Recommendation Clinical Impression  This 78 year old female was admitted for R TKA.  She has had previous sx for hips.  She has AE and DME at home.  She will have help for ADLs; will follow in acute to ensure safe bathroom transfers.    OT Assessment  Patient needs continued OT Services    Follow Up Recommendations  No OT follow up    Barriers to Discharge      Equipment Recommendations  None recommended by PT    Recommendations for Other Services    Frequency  Min 2X/week    Precautions / Restrictions Precautions Precautions: Knee Required Braces or Orthoses: Knee Immobilizer - Right Knee Immobilizer - Right: Discontinue once straight leg raise with < 10 degree lag Restrictions Weight Bearing Restrictions: No Other Position/Activity Restrictions: WBAT   Pertinent Vitals/Pain     ADL  Eating/Feeding: Simulated;Independent Where Assessed - Eating/Feeding: Chair Grooming: Simulated;Set up Where Assessed - Grooming: Unsupported sitting Upper Body Bathing: Performed;Set up Where Assessed - Upper Body Bathing: Sitting, chair;Unsupported Lower Body Bathing: Performed;Moderate assistance Where Assessed - Lower Body Bathing: Sit to stand from chair Upper Body Dressing: Performed;Minimal assistance Where Assessed - Upper Body Dressing: Sitting, chair;Unsupported Lower Body Dressing: Simulated;Moderate assistance Where Assessed - Lower Body Dressing: Sit to stand from chair Toilet Transfer: Not assessed (pt was nauseaus with PT.  Did not want to try yet) Toileting - Clothing Manipulation: Minimal assistance;Simulated Where Assessed - Toileting Clothing Manipulation: Standing Toileting - Hygiene: Minimal assistance;Simulated Where Assessed - Toileting Hygiene: Sit to stand  from 3-in-1 or toilet Tub/Shower Transfer: Not assessed Equipment Used: Rolling walker ADL Comments: tolerated tx well without nausea but wants to wait until tomorrow for bathroom transfers.  Has AE and help for adls    OT Diagnosis: Generalized weakness  OT Problem List: Decreased strength;Decreased activity tolerance;Pain;Decreased knowledge of use of DME or AE OT Treatment Interventions: Self-care/ADL training;DME and/or AE instruction;Patient/family education   OT Goals Acute Rehab OT Goals OT Goal Formulation: With patient Time For Goal Achievement: 10/22/11 Potential to Achieve Goals: Good ADL Goals Pt Will Transfer to Toilet: with min assist;Ambulation;3-in-1 (min guard) ADL Goal: Toilet Transfer - Progress: Goal set today Pt Will Perform Toileting - Clothing Manipulation: with supervision;Standing ADL Goal: Toileting - Clothing Manipulation - Progress: Goal set today Pt Will Perform Toileting - Hygiene: with supervision;Sit to stand from 3-in-1/toilet ADL Goal: Toileting - Hygiene - Progress: Goal set today Pt Will Perform Tub/Shower Transfer: Shower transfer;with min assist;Shower seat without back;Ambulation (min guard) ADL Goal: Tub/Shower Transfer - Progress: Goal set today  Visit Information  Last OT Received On: 10/15/11 Assistance Needed: +1    Subjective Data  Subjective: "I'm worried about when I have to use the bathroom" Patient Stated Goal: home; shower   Prior Functioning  Home Living Bathroom Shower/Tub: Walk-in shower Bathroom Toilet: Standard (3:1) Home Adaptive Equipment: Walker - rolling;Quad cane Prior Function Level of Independence: Independent Driving: Yes Communication Communication: No difficulties Dominant Hand: Right    Cognition  Overall Cognitive Status: Appears within functional limits for tasks assessed/performed Arousal/Alertness: Awake/alert Orientation Level: Appears intact for tasks assessed Behavior During Session: [facility] for tasks  performed    Extremity/Trunk Assessment Right Upper Extremity Assessment RUE ROM/Strength/Tone: Within functional levels Left Upper Extremity Assessment LUE ROM/Strength/Tone: Within functional levels  Mobility Transfers Sit to Stand: 4: Min assist;From chair/3-in-1;With upper extremity assist Stand to Sit: 4: Min assist Details for Transfer Assistance: min cues for hand placement   Exercise    Balance    End of Session OT - End of Session Activity Tolerance: Patient tolerated treatment well Patient left: in chair;with call bell/phone within reach   [facility] 10/15/2011, 11:06 AM Marica Otter, OTR/L [phone] 10/15/2011

## 2011-10-16 DIAGNOSIS — D62 Acute posthemorrhagic anemia: Secondary | ICD-10-CM | POA: Diagnosis not present

## 2011-10-16 LAB — BASIC METABOLIC PANEL
GFR calc Af Amer: 71 mL/min — ABNORMAL LOW (ref >90)
GFR calc non Af Amer: 61 mL/min — ABNORMAL LOW (ref >90)
Potassium: 3.6 mEq/L (ref 3.5–5.1)
Sodium: 137 mEq/L (ref 135–145)

## 2011-10-16 LAB — CBC
MCHC: 33 g/dL (ref 30.0–36.0)
RDW: 14 % (ref 11.5–15.5)

## 2011-10-16 MED ORDER — RIVAROXABAN 10 MG PO TABS: 10.0000 mg | ORAL_TABLET | Freq: Every day | ORAL | Status: DC

## 2011-10-16 MED ORDER — METHOCARBAMOL 500 MG PO TABS: 500.0000 mg | ORAL_TABLET | Freq: Four times a day (QID) | ORAL | Status: AC | PRN

## 2011-10-16 MED ORDER — METHOCARBAMOL 500 MG PO TABS: 500.0000 mg | ORAL_TABLET | Freq: Four times a day (QID) | ORAL | Status: DC | PRN

## 2011-10-16 MED ORDER — OXYCODONE HCL 5 MG PO TABS: 5.0000 mg | ORAL_TABLET | ORAL | Status: AC | PRN

## 2011-10-16 NOTE — Progress Notes (Signed)
Occupational Therapy Treatment Patient Details Name: Leah [name] MRN: [id number] DOB: [birth year] Today's Date: 10/16/2011 Time: 0822-0905 OT Time Calculation (min): 43 min  OT Assessment / Plan / Recommendation Comments on Treatment Session      Follow Up Recommendations  No OT follow up    Barriers to Discharge       Equipment Recommendations  None recommended by PT;None recommended by OT    Recommendations for Other Services    Frequency Min 2X/week   Plan      Precautions / Restrictions Precautions Precautions: Knee Required Braces or Orthoses: Knee Immobilizer - Right Knee Immobilizer - Right: Discontinue once straight leg raise with < 10 degree lag Restrictions Other Position/Activity Restrictions: WBAT   Pertinent Vitals/Pain 2/10    ADL  Lower Body Bathing: Performed;Moderate assistance Where Assessed - Lower Body Bathing: Sitting in shower Toilet Transfer: Performed;Supervision/safety (min guard for sit to stand; min cues ) Toilet Transfer Method: Ambulating Toilet Transfer Equipment: Raised toilet seat with arms (or 3-in-1 over toilet) Toileting - Hygiene: Supervision/safety;Other (comment) (min guard for sit to stand) Where Assessed - Toileting Hygiene: Sit to stand from 3-in-1 or toilet Equipment Used: Rolling walker Ambulation Related to ADLs: supervision once standing; min cues for LE and arm placement.  Supervision once up ADL Comments: Pt wanted a shower prior to home as we were practicing transfer during this session    OT Diagnosis:    OT Problem List:   OT Treatment Interventions:     OT Goals Acute Rehab OT Goals Time For Goal Achievement: 10/22/11 ADL Goals Pt Will Perform Lower Body Bathing: with min assist;Sit to stand from chair ADL Goal: Lower Body Bathing - Progress: Goal set today Pt Will Transfer to Toilet: with min assist;Ambulation;3-in-1 ADL Goal: Toilet Transfer - Progress: Met Pt Will Perform Toileting - Hygiene: with  supervision;Sit to stand from 3-in-1/toilet ADL Goal: Toileting - Hygiene - Progress: Progressing toward goals Pt Will Perform Tub/Shower Transfer: Shower transfer;with min assist;Shower seat without back;Ambulation ADL Goal: Tub/Shower Transfer - Progress: Met  Visit Information  Last OT Received On: 10/16/11 Assistance Needed: +1    Subjective Data  Subjective: Thank you especially for the shower   Prior Functioning       Cognition  Overall Cognitive Status: Appears within functional limits for tasks assessed/performed Behavior During Session: [facility] for tasks performed    Mobility Bed Mobility Bed Mobility: Supine to Sit Supine to Sit: 4: Min assist Transfers Sit to Stand: 4: Min guard Stand to Sit: 4: Min assist;Other (comment) (to support RLE on high surface) Details for Transfer Assistance: min cues RLE and arm positioning   Exercises    Balance    End of Session OT - End of Session Activity Tolerance: Patient tolerated treatment well Patient left: in chair;with call bell/phone within reach   [facility] 10/16/2011, 9:12 AM Marica Otter, OTR/L [phone] 10/16/2011

## 2011-10-16 NOTE — Progress Notes (Signed)
Physical Therapy Treatment Patient Details Name: Leah [name] MRN: [id number] DOB: 1930-01-10 Today's Date: 10/16/2011 Time: 4098-1191 PT Time Calculation (min): 25 min  PT Assessment / Plan / Recommendation Comments on Treatment Session  Practiced stairs with family using one rail and one crutch.    Follow Up Recommendations  Home health PT    Barriers to Discharge        Equipment Recommendations  Other (comment) (has all from previous THR)    Recommendations for Other Services    Frequency 7X/week   Plan Discharge plan remains appropriate    Precautions / Restrictions Precautions Precautions: Knee Required Braces or Orthoses: Knee Immobilizer - Right Knee Immobilizer - Right: Discontinue once straight leg raise with < 10 degree lag Restrictions Other Position/Activity Restrictions: WBAT   Pertinent Vitals/Pain C/o "soreness"     Mobility  Sit to Stand: 5: Supervision Stand to Sit: 5: Supervision Details for Transfer Assistance: one VC on hand placement and increased time Ambulation/Gait Ambulation/Gait Assistance: 5: Supervision Ambulation Distance (Feet): 15 Feet to and from stairs Assistive device: Rolling walker Ambulation/Gait Assistance Details: with KI and increased time Gait Pattern: Step-to pattern;Decreased stance time - right Stairs: Yes Stairs Assistance: 4: Min guard Stair Management Technique: One rail Right;With one crutch Number of Stairs: 4  With family (spouse & niece)   PT Goals Acute Rehab PT Goals PT Goal Formulation: With patient Potential to Achieve Goals: Good Pt will go Sit to Stand: with supervision PT Goal: Sit to Stand - Progress: Met Pt will go Stand to Sit: with supervision PT Goal: Stand to Sit - Progress: Met Pt will Ambulate: >150 feet;with supervision;with rolling walker PT Goal: Ambulate - Progress: Partly met Pt will Go Up / Down Stairs: 6-9 stairs;with min assist;with least restrictive assistive device PT Goal:  Up/Down Stairs - Progress: Met  Visit Information  Last PT Received On: 10/16/11 Assistance Needed: +1    Subjective Data  Subjective: I am going home today Patient Stated Goal: n/a   Cognition  Overall Cognitive Status: Appears within functional limits for tasks assessed/performed Behavior During Session: [facility] for tasks performed    Balance   fair with RW  End of Session PT - End of Session Equipment Utilized During Treatment: Gait belt Activity Tolerance: Patient tolerated treatment well Patient left: in chair;with bed alarm set;with family/visitor present Nurse Communication: Other (comment) (Pt ready for D/C to home)    Felecia Shelling  PTA [facility]  Acute  Rehab Pager     [phone]

## 2011-10-16 NOTE — Discharge Summary (Signed)
Physician Discharge Summary   Patient ID: Leah [name] MRN: [id number] DOB/AGE: 11-22-1929 78 y.o.  Admit date: 10/13/2011 Discharge date: 10/16/2011  Primary Diagnosis: Osteoarthritis Right Knee   Admission Diagnoses:  Past Medical History  Diagnosis Date  . GERD (gastroesophageal reflux disease)   . Acute maxillary sinusitis   . Unspecified adverse effect of unspecified drug, medicinal and biological substance   . Basal cell carcinoma of nose   . Localized osteoarthrosis not specified whether primary or secondary, lower leg   . Hyperlipidemia   . Family history of malignant neoplasm of breast   . Family history of diabetes mellitus   . Osteoporosis   . Osteopenia   . DUB (dysfunctional uterine bleeding)   . Endometrial polyp   . Uterine prolapse   . Cystocele   . Detrusor instability   . Glaucoma   . PONV (postoperative nausea and vomiting)     19 yrs ago- states has had general anesthesia since and did well  . Carpal tunnel syndrome   . Bundle branch block, unspecified     EKG 4/13 with clearance and note that isnt new block Dr Lovell Sheehan on chart   Discharge Diagnoses:   Principal Problem:  *OA (osteoarthritis) of knee Active Problems:  Postop Acute blood loss anemia  Procedure:  Procedure(s) (LRB): TOTAL KNEE ARTHROPLASTY (Right)   Consults: None  HPI: The patient is a 78 year old female who comes in for a preoperative History and Physical. The patient is scheduled for a right total knee arthroplasty to be performed by Dr. Gus Rankin. [name], MD at [facility] on Monday Oct 13, 2011 . The patient has been seen by Dr. [name] office for about a year. She has had progressively worsening right knee pain. She is starting to have more difficulty with ambulation. Her knee is starting to give out more. The last injection did not help as much as previous ones had. She is concerned about falling now. The knee feels unstable. It also is very painful. Due to  failure of conservative measures, the most predictable means for decreased pain and increased function in the right knee is a right total knee arthroplasty. Risks and benefits of the surgery discussed.  Laboratory Data: Hospital Outpatient Visit on 10/06/2011  Component Date Value Range Status  . MRSA, PCR  10/06/2011 NEGATIVE  NEGATIVE Final  . Staphylococcus aureus  10/06/2011 NEGATIVE  NEGATIVE Final   Comment:                                 The Xpert SA Assay (FDA                          approved for NASAL specimens                          only), is one component of                          a comprehensive surveillance                          program.  It is not intended                          to diagnose  infection nor to                          guide or monitor treatment.  . Prothrombin Time (seconds) 10/06/2011 12.4  11.6-15.2 Final  . INR  10/06/2011 0.91  0.00-1.49 Final  . Color, Urine  10/06/2011 YELLOW  YELLOW Final  . APPearance  10/06/2011 CLEAR  CLEAR Final  . Specific Gravity, Urine  10/06/2011 1.028  1.005-1.030 Final  . pH  10/06/2011 7.0  5.0-8.0 Final  . Glucose, UA (mg/dL) 16/03/9603 NEGATIVE  NEGATIVE Final  . Hgb urine dipstick  10/06/2011 NEGATIVE  NEGATIVE Final  . Bilirubin Urine  10/06/2011 NEGATIVE  NEGATIVE Final  . Ketones, ur (mg/dL) 54/01/8118 NEGATIVE  NEGATIVE Final  . Protein, ur (mg/dL) 14/78/2956 NEGATIVE  NEGATIVE Final  . Urobilinogen, UA (mg/dL) 21/30/8657 0.2  8.4-6.9 Final  . Nitrite  10/06/2011 NEGATIVE  NEGATIVE Final  . Leukocytes, UA  10/06/2011 TRACE* NEGATIVE Final  . aPTT (seconds) 10/06/2011 32  24-37 Final  . Squamous Epithelial / LPF  10/06/2011 FEW* RARE Final  . WBC, UA (WBC/hpf) 10/06/2011 0-2  <3 Final  . Urine-Other  10/06/2011 MUCOUS PRESENT   Final    Basename 10/16/11 0416 10/15/11 0430 10/14/11 0430  HGB 9.2* 10.4* 9.9*    Basename 10/16/11 0416 10/15/11 0430  WBC 7.1 8.9  RBC 3.15* 3.51*  HCT 27.9* 31.0*    PLT 173 179    Basename 10/16/11 0416 10/15/11 0430  NA 137 135  K 3.6 3.9  CL 101 101  CO2 29 27  BUN 17 13  CREATININE 0.88 0.71  GLUCOSE 114* 119*  CALCIUM 9.1 8.7   No results found for this basename: LABPT:2,INR:2 in the last 72 hours  X-Rays:Dg Chest 2 View  10/06/2011  *RADIOLOGY REPORT*  Clinical Data: Preoperative evaluation.  Previous smoker.  CHEST - 2 VIEW  Comparison: 03/18/2007.  Findings: There is stable normal appearance of cardiac silhouette. There is slight aortic ectasia.  No pulmonary infiltrates or nodules were evident. Stable small linear density in left epicardial region is seen on PA image.  This could be a small area of fibrosis or related to a fat pad.  It is unchanged.  No pleural abnormality is evident.  There is osteopenic appearance of the bones.  Changes of degenerative disc disease and degenerative spondylosis are present.  IMPRESSION: No acute or active cardiopulmonary or pleural abnormalities are evident.  Original Report Authenticated By: Crawford Givens, M.D.    EKG: Orders placed in visit on 09/24/11  . EKG 12-LEAD  . EKG 12-LEAD     Hospital Course: Patient was admitted to [facility] and taken to the OR and underwent the above state procedure without complications.  Patient tolerated the procedure well and was later transferred to the recovery room and then to the orthopaedic floor for postoperative care.  They were given PO and IV analgesics for pain control following their surgery.  They were given 24 hours of postoperative antibiotics and started on DVT prophylaxis in the form of Xarelto.   PT and OT were ordered for total joint protocol.  Discharge planning consulted to help with postop disposition and equipment needs.  Patient had a decent night on the evening of surgery and started to get up OOB with therapy on day one.  PCA Morphine was discontinued and they were weaned over to PO meds.  Hemovac drain was pulled without difficulty.   Continued to work  with therapy into day two despite having increased pain.  Dressing was changed on day two and the incision was healing well.  Worked with the staff for better pain control.  By day three, the patient had progressed with therapy and meeting their goals and their pain was improving.  Incision was healing well.  Patient was seen in rounds and was ready to go home.  Discharge Medications: Prior to Admission medications   Medication Sig Start Date End Date Taking? Authorizing Provider  acetaminophen (TYLENOL) 500 MG tablet Take 1,000 mg by mouth every 6 (six) hours as needed.   Yes Historical Provider, MD  latanoprost (XALATAN) 0.005 % ophthalmic solution Place 1 drop into both eyes at bedtime.    Yes Historical Provider, MD  omeprazole (PRILOSEC) 20 MG capsule Take 20 mg by mouth daily.    Yes Historical Provider, MD  methocarbamol (ROBAXIN) 500 MG tablet Take 1 tablet (500 mg total) by mouth every 6 (six) hours as needed. 10/16/11 10/26/11  Gerrod Maule, PA  mometasone (NASONEX) 50 MCG/ACT nasal spray Place 2 sprays into the nose daily.     Historical Provider, MD  oxyCODONE (OXY IR/ROXICODONE) 5 MG immediate release tablet Take 1-2 tablets (5-10 mg total) by mouth every 4 (four) hours as needed for pain. 10/16/11 10/26/11  Leontine Radman Julien Girt, PA  rivaroxaban (XARELTO) 10 MG TABS tablet Take 1 tablet (10 mg total) by mouth daily with breakfast. Take for two and a half more weeks, then discontinue Xarelto.  Once the patient has completed the Xarelto, they may resume the 81 mg Aspirin. 10/16/11   Maloni Musleh Julien Girt, PA    Diet: Cardiac diet Activity:WBAT Follow-up:in 2 weeks Disposition - Home Discharged Condition: good   Discharge Orders    Future Appointments: Provider: Department: Dept Phone: Center:   01/28/2012 10:15 AM Stacie Glaze, MD [facility] [phone] [facility]     Future Orders Please Complete By Expires   Diet - low sodium heart healthy      Call MD /  Call 911      Comments:   If you experience chest pain or shortness of breath, CALL 911 and be transported to the hospital emergency room.  If you develope a fever above 101 F, pus (white drainage) or increased drainage or redness at the wound, or calf pain, call your surgeon's office.   Discharge instructions      Comments:   Pick up stool softner and laxative for home. Do not submerge incision under water. May shower. Continue to use ice for pain and swelling from surgery.  Take Xarelto for two and a half more weeks, then discontinue Xarelto. Once the patient has completed the Xarelto, they may resume the 81 mg Aspirin.   Constipation Prevention      Comments:   Drink plenty of fluids.  Prune juice may be helpful.  You may use a stool softener, such as Colace (over the counter) 100 mg twice a day.  Use MiraLax (over the counter) for constipation as needed.   Increase activity slowly as tolerated      Weight Bearing as taught in Physical Therapy      Comments:   Use a walker or crutches as instructed.   Patient may shower      Comments:   You may shower without a dressing once there is no drainage.  Do not wash over the wound.  If drainage remains, do not shower until drainage stops.   Driving restrictions  Comments:   No driving until released by the physician.   Lifting restrictions      Comments:   No lifting until released by the physician.   TED hose      Comments:   Use stockings (TED hose) for 3 weeks on both leg(s).  You may remove them at night for sleeping.   Change dressing      Comments:   Change dressing daily with sterile 4 x 4 inch gauze dressing and apply TED hose. Do not submerge the incision under water.   Do not put a pillow under the knee. Place it under the heel.      Do not sit on low chairs, stoools or toilet seats, as it may be difficult to get up from low surfaces        Medication List  As of 10/16/2011  9:45 AM   STOP taking these medications          aspirin 81 MG EC tablet      CALCIUM 600 1500 MG Tabs      Fish Oil 1000 MG Caps      meloxicam 15 MG tablet      NON FORMULARY      vitamin C 1000 MG tablet      Vitamin D 1000 UNITS capsule      vitamin E 400 UNIT capsule         TAKE these medications         acetaminophen 500 MG tablet   Commonly known as: TYLENOL   Take 1,000 mg by mouth every 6 (six) hours as needed.      latanoprost 0.005 % ophthalmic solution   Commonly known as: XALATAN   Place 1 drop into both eyes at bedtime.      methocarbamol 500 MG tablet   Commonly known as: ROBAXIN   Take 1 tablet (500 mg total) by mouth every 6 (six) hours as needed.      mometasone 50 MCG/ACT nasal spray   Commonly known as: NASONEX   Place 2 sprays into the nose daily.      omeprazole 20 MG capsule   Commonly known as: PRILOSEC   Take 20 mg by mouth daily.      oxyCODONE 5 MG immediate release tablet   Commonly known as: Oxy IR/ROXICODONE   Take 1-2 tablets (5-10 mg total) by mouth every 4 (four) hours as needed for pain.      rivaroxaban 10 MG Tabs tablet   Commonly known as: XARELTO   Take 1 tablet (10 mg total) by mouth daily with breakfast. Take for two and a half more weeks, then discontinue Xarelto.  Once the patient has completed the Xarelto, they may resume the 81 mg Aspirin.           Follow-up Information    Follow up with Loanne Drilling, MD. Schedule an appointment as soon as possible for a visit in 2 weeks.   Contact information:   [facility] [address], [address] [place] [address] [phone]          Signed: Patrica Duel 10/16/2011, 9:45 AM

## 2011-10-16 NOTE — Progress Notes (Signed)
Subjective: 3 Days Post-Op Procedure(s) (LRB): TOTAL KNEE ARTHROPLASTY (Right) Patient reports pain as mild.   Patient seen in rounds with Dr. [name]. Patient is well, but has had some minor complaints of pain in the knee, requiring pain medications Patient is ready to go home if she does well with her therapy this morning. Will set up discharge for today.  Objective: Vital signs in last 24 hours: Temp:  [99 F (37.2 C)-100.4 F (38 C)] 99 F (37.2 C) (05/16 0628) Pulse Rate:  [90-107] 90  (05/16 0628) Resp:  [12-16] 16  (05/16 0628) BP: (120-132)/(73-77) 120/77 mmHg (05/16 0628) SpO2:  [86 %-95 %] 94 % (05/16 0628)  Intake/Output from previous day:  Intake/Output Summary (Last 24 hours) at 10/16/11 0930 Last data filed at 10/16/11 4098  Gross per 24 hour  Intake    780 ml  Output    425 ml  Net    355 ml    Intake/Output this shift:    Labs:  Basename 10/16/11 0416 10/15/11 0430 10/14/11 0430  HGB 9.2* 10.4* 9.9*    Basename 10/16/11 0416 10/15/11 0430  WBC 7.1 8.9  RBC 3.15* 3.51*  HCT 27.9* 31.0*  PLT 173 179    Basename 10/16/11 0416 10/15/11 0430  NA 137 135  K 3.6 3.9  CL 101 101  CO2 29 27  BUN 17 13  CREATININE 0.88 0.71  GLUCOSE 114* 119*  CALCIUM 9.1 8.7   No results found for this basename: LABPT:2,INR:2 in the last 72 hours  EXAM: General - Patient is Alert, Appropriate and Oriented Extremity - Neurovascular intact Sensation intact distally Incision - clean, dry, no drainage, healing Motor Function - intact, moving foot and toes well on exam.   Assessment/Plan: 3 Days Post-Op Procedure(s) (LRB): TOTAL KNEE ARTHROPLASTY (Right) Procedure(s) (LRB): TOTAL KNEE ARTHROPLASTY (Right) Past Medical History  Diagnosis Date  . GERD (gastroesophageal reflux disease)   . Acute maxillary sinusitis   . Unspecified adverse effect of unspecified drug, medicinal and biological substance   . Basal cell carcinoma of nose   . Localized osteoarthrosis  not specified whether primary or secondary, lower leg   . Hyperlipidemia   . Family history of malignant neoplasm of breast   . Family history of diabetes mellitus   . Osteoporosis   . Osteopenia   . DUB (dysfunctional uterine bleeding)   . Endometrial polyp   . Uterine prolapse   . Cystocele   . Detrusor instability   . Glaucoma   . PONV (postoperative nausea and vomiting)     19 yrs ago- states has had general anesthesia since and did well  . Carpal tunnel syndrome   . Bundle branch block, unspecified     EKG 4/13 with clearance and note that isnt new block Dr Lovell Sheehan on chart   Principal Problem:  *OA (osteoarthritis) of knee   Discharge home with home health Diet - Cardiac diet Follow up - in 2 weeks Activity - WBAT Disposition - Home Condition Upon Discharge - Good D/C Meds - See DC Summary DVT Prophylaxis - Xarelto  Leah [name] 10/16/2011, 9:30 AM

## 2011-10-16 NOTE — Progress Notes (Signed)
Physical Therapy Treatment Patient Details Name: Leah [name] MRN: [id number] DOB: [birth year] Today's Date: 10/16/2011 Time: 0930-1010 PT Time Calculation (min): 40 min  PT Assessment / Plan / Recommendation Comments on Treatment Session  Pt eager to D/C to home with spouse and assistance from Niece.  Pt given handout on TKR HEP and performed all supine TE's.  Pt instructed on KI use for amb and when to D/C.  Will see again this am with family to practice stairs.    Follow Up Recommendations  Home health PT    Barriers to Discharge        Equipment Recommendations  Other (comment) (has all from previous THR)    Recommendations for Other Services    Frequency 7X/week   Plan Discharge plan remains appropriate    Precautions / Restrictions Precautions Precautions: Knee Required Braces or Orthoses: Knee Immobilizer - Right Knee Immobilizer - Right: Discontinue once straight leg raise with < 10 degree lag Restrictions Weight Bearing Restrictions: No Other Position/Activity Restrictions: WBAT   Pertinent Vitals/Pain C/o 5/10 pain during TE's....Marland KitchenMarland KitchenThe patient is advised to apply ice or cold packs intermittently as needed to relieve pain. applied    Mobility  Bed Mobility Bed Mobility: Supine to Sit Supine to Sit: 4: Min assist Details for Bed Mobility Assistance: Pt OOB in recliner Transfers Transfers: Sit to Stand;Stand to Sit Sit to Stand: 5: Supervision;From chair/3-in-1;From toilet Stand to Sit: 5: Supervision;To chair/3-in-1;To toilet Details for Transfer Assistance: one VC on hand placement and increased time Ambulation/Gait Ambulation/Gait Assistance: 4: Min guard Ambulation Distance (Feet): 55 Feet Assistive device: Rolling walker Ambulation/Gait Assistance Details: increased time and one standing rest break.  Pt c/o fatigue due to poor sleep. Gait Pattern: Step-to pattern;Decreased stance time - right Stairs: No Wheelchair Mobility Wheelchair Mobility: No    Exercises Total Joint Exercises Ankle Circles/Pumps: AROM;Both;10 reps;Supine Quad Sets: AROM;Both;10 reps;Supine Gluteal Sets: AROM;Both;10 reps;Supine Towel Squeeze: AROM;Both;10 reps;Supine Short Arc Quad: AAROM;Right;10 reps;Supine Hip ABduction/ADduction: Right;10 reps;Supine;AROM Straight Leg Raises: AAROM;Right;10 reps;Supine    PT Goals Acute Rehab PT Goals PT Goal Formulation: With patient Potential to Achieve Goals: Good Pt will go Sit to Stand: with supervision PT Goal: Sit to Stand - Progress: Met Pt will go Stand to Sit: with supervision PT Goal: Stand to Sit - Progress: Met Pt will Ambulate: >150 feet;with supervision;with rolling walker PT Goal: Ambulate - Progress: Partly met  Visit Information  Last PT Received On: 10/16/11 Assistance Needed: +1    Subjective Data  Subjective: I am going home today Patient Stated Goal: n/a   Cognition  Overall Cognitive Status: Appears within functional limits for tasks assessed/performed Behavior During Session: [facility] for tasks performed    Balance   fair with RW  End of Session PT - End of Session Equipment Utilized During Treatment: Gait belt;Right knee immobilizer Activity Tolerance: Patient tolerated treatment well Patient left: in chair;with call bell/phone within reach    Felecia Shelling  PTA [facility]  Acute  Rehab Pager     [phone]

## 2011-10-16 NOTE — Discharge Instructions (Signed)
 Dr. Frank [name] Total Joint Specialist [facility] [address] , [place] [address] [phone]  TOTAL KNEE REPLACEMENT POSTOPERATIVE DIRECTIONS    Knee Rehabilitation, Guidelines Following Surgery  Results after knee surgery are often greatly improved when you follow the exercise, range of motion and muscle strengthening exercises prescribed by your doctor. Safety measures are also important to protect the knee from further injury. Any time any of these exercises cause you to have increased pain or swelling in your knee joint, decrease the amount until you are comfortable again and slowly increase them. If you have problems or questions, call your caregiver or physical therapist for advice.   HOME CARE INSTRUCTIONS  Remove items at home which could result in a fall. This includes throw rugs or furniture in walking pathways.  Continue medications as instructed at time of discharge. You may have some home medications which will be placed on hold until you complete the course of blood thinner medication.  You may start showering once you are discharged home but do not submerge the incision under water.  Walk with walker as instructed.   Use walker as long as suggested by your caregivers.  Avoid periods of inactivity such as sitting longer than an hour when not asleep. This helps prevent blood clots.  You may put full weight on your legs and walk as much as is comfortable.  You may resume a sexual relationship in one month or when given the OK by your doctor.  You may return to work once you are cleared by your doctor.  Do not drive a car for 6 weeks or until released by you surgeon.   Do not drive while taking narcotics.  Wear the elastic stockings for three weeks following surgery during the day but you may remove then at night. Make sure you keep all of your appointments after your operation with all of your doctors and caregivers. You should call  the office at the above phone number and make an appointment for approximately two weeks after the date of your surgery. Change the dressing daily and reapply a dry dressing each time. Please pick up a stool softener and laxative for home use as long as you are requiring pain medications. Continue to use ice on the knee for pain and swelling from surgery. It is important for you to complete the blood thinner medication as prescribed by your doctor.  RANGE OF MOTION AND STRENGTHENING EXERCISES  Rehabilitation of the knee is important following a knee injury or an operation. After just a few days of immobilization, the muscles of the thigh which control the knee become weakened and shrink (atrophy). Knee exercises are designed to build up the tone and strength of the thigh muscles and to improve knee motion. Often times heat used for twenty to thirty minutes before working out will loosen up your tissues and help with improving the range of motion but do not use heat for the first two weeks following surgery. These exercises can be done on a training (exercise) mat, on the floor, on a table or on a bed. Use what ever works the best and is most comfortable for you Knee exercises include:  Leg Lifts - While your knee is still immobilized in a splint or cast, you can do straight leg raises. Lift the leg to 60 degrees, hold for 3 sec, and slowly lower the leg. Repeat 10-20 times 2-3 times daily. Perform this exercise against resistance later as your   knee gets better.  Quad and Hamstring Sets - Tighten up the muscle on the front of the thigh (Quad) and hold for 5-10 sec. Repeat this 10-20 times hourly. Hamstring sets are done by pushing the foot backward against an object and holding for 5-10 sec. Repeat as with quad sets.  A rehabilitation program following serious knee injuries can speed recovery and prevent re-injury in the future due to weakened muscles. Contact your doctor or a physical therapist for more  information on knee rehabilitation.   SKILLED REHAB INSTRUCTIONS: If the patient is transferred to a skilled rehab facility following release from the hospital, a list of the current medications will be sent to the facility for the patient to continue.  When discharged from the skilled rehab facility, please have the facility set up the patient's Home Health Physical Therapy prior to being released. Also, the skilled facility will be responsible for providing the patient with their medications at time of release from the facility to include their pain medication, the muscle relaxants, and their blood thinner medication. If the patient is still at the rehab facility at time of the two week follow up appointment, the skilled rehab facility will also need to assist the patient in arranging follow up appointment in our office and any transportation needs.  MAKE SURE YOU:  Understand these instructions.  Will watch your condition.  Will get help right away if you are not doing well or get worse.  Document Released: 05/19/2005 Document Revised: 05/08/2011 Document Reviewed: 11/06/2006  [employer] Patient Information 2012 [employer]   Take for two and a half more weeks, then discontinue Xarelto. Once the patient has completed the Xarelto, they may resume the 81 mg Aspirin.        

## 2011-10-22 ENCOUNTER — Telehealth: Payer: Self-pay | Admitting: Internal Medicine

## 2011-10-22 MED ORDER — LACTULOSE 20 GM/30ML PO SOLN: 30.0000 mL | Freq: Two times a day (BID) | ORAL | Status: DC | PRN

## 2011-10-22 NOTE — Telephone Encounter (Addendum)
Pt took enema yesterday afternoon and had a bowel movement yesterday she has not had one today.  She has home health care nurse tomorrow and she is going ask the nurse to review her meds with her.  She is taking Oxycodone 5mg .  Per Dr Artist Pais if she can handle not taking the oxycodone then d/c it and take tylenol.  Miralax is not helping her so Dr Philis Pique Lactulose 30 mL bid prn no refills.  Per Dr Artist Pais needs to f/u with Dr Lovell Sheehan in a week.  Pt has an appt with Dr Despina Hick on Tuesday but Dr Artist Pais would still like for pt to f/u with Dr Lovell Sheehan.  Rx called in to [facility], pt aware

## 2011-10-22 NOTE — Telephone Encounter (Addendum)
Pt called and had knee replacement surgery last week. Pt is having problems with severe bowel blockages since surgery. Pt is req call back from nurse re: the meds she is taking and other issues. Pt aware that Dr Lovell Sheehan and Rushie Goltz is out of office.

## 2011-12-09 ENCOUNTER — Encounter: Payer: Self-pay | Admitting: Obstetrics and Gynecology

## 2011-12-09 ENCOUNTER — Ambulatory Visit (INDEPENDENT_AMBULATORY_CARE_PROVIDER_SITE_OTHER): Payer: Medicare Other | Admitting: Obstetrics and Gynecology

## 2011-12-09 VITALS — BP 124/76 | Ht 62.5 in | Wt 144.0 lb

## 2011-12-09 DIAGNOSIS — N814 Uterovaginal prolapse, unspecified: Secondary | ICD-10-CM

## 2011-12-09 DIAGNOSIS — R350 Frequency of micturition: Secondary | ICD-10-CM

## 2011-12-09 DIAGNOSIS — N8111 Cystocele, midline: Secondary | ICD-10-CM

## 2011-12-09 DIAGNOSIS — IMO0002 Reserved for concepts with insufficient information to code with codable children: Secondary | ICD-10-CM

## 2011-12-09 DIAGNOSIS — R3915 Urgency of urination: Secondary | ICD-10-CM

## 2011-12-09 DIAGNOSIS — M810 Age-related osteoporosis without current pathological fracture: Secondary | ICD-10-CM

## 2011-12-09 NOTE — Progress Notes (Signed)
Patient came to see me today for further follow up. She was diagnosed in 2003 with osteoporosis. We treated her with Fosamax from 2003 until Leah [name] was approved and then she stayed on Pierson until February, 2011.  She therefore was treated for approximately 8 years. She has now been on drug holiday for 2 years. Her last bone density showed stable osteopenia. She has had no fractures. She is scheduled for bone density this summer at our office. She is now had bilateral hip replacements and a knee replacement for osteoarthritis. She also has uterine prolapse and cystocele and is mildly symptomatic with some relaxation symptoms. It is very tolerable. She has no urinary incontinence or frequent urinary tract infections. She does have urinary frequency and urgency suggestive of detrussor instability but has not felt it required medical intervention. She is having no vaginal bleeding. She is having no pelvic pain. She is due for her yearly mammogram. She has never had an abnormal Pap smear. Her last Pap smear was June, 2011.  ROS: 12 system review done. Pertinent positives above. Other positives include hyperlipidemia, carpal tunnel syndrome, bundle-branch block, GERD, and glaucoma.  Physical examination: Kennon Portela present. HEENT within normal limits. Neck: Thyroid not large. No masses. Supraclavicular nodes: not enlarged. Breasts: Examined in both sitting and lying  position. No skin changes and no masses. Abdomen: Soft no guarding rebound or masses or hernia. Pelvic: External: Within normal limits. BUS: Within normal limits. Vaginal:within normal limits. Good estrogen effect. Second-degree cystocele. Cervix: clean. Uterus: Normal size and shape with second-degree uterine prolapse. Adnexa: No masses. Rectovaginal exam: Confirmatory and negative.  Assessment: #1. Previous osteoporosis with current osteopenia-on two-year drug holiday. #2 uterine prolapse. #3 cystocele. #4 detrussor instability.  Plan:  Mammogram. Bone density. Observation of symptomatic pelvic relaxation and detrussor instability. The new Pap smear guidelines were discussed with the patient. No pap done. Extremities: Within normal limits.

## 2011-12-15 ENCOUNTER — Other Ambulatory Visit: Payer: Self-pay | Admitting: Obstetrics and Gynecology

## 2011-12-15 DIAGNOSIS — M810 Age-related osteoporosis without current pathological fracture: Secondary | ICD-10-CM

## 2011-12-16 ENCOUNTER — Ambulatory Visit (INDEPENDENT_AMBULATORY_CARE_PROVIDER_SITE_OTHER): Payer: Medicare Other

## 2011-12-16 DIAGNOSIS — M810 Age-related osteoporosis without current pathological fracture: Secondary | ICD-10-CM

## 2011-12-16 DIAGNOSIS — M8580 Other specified disorders of bone density and structure, unspecified site: Secondary | ICD-10-CM

## 2011-12-16 DIAGNOSIS — M899 Disorder of bone, unspecified: Secondary | ICD-10-CM

## 2011-12-18 ENCOUNTER — Encounter: Payer: Self-pay | Admitting: Obstetrics and Gynecology

## 2012-01-28 ENCOUNTER — Encounter: Payer: Self-pay | Admitting: Internal Medicine

## 2012-01-28 ENCOUNTER — Ambulatory Visit (INDEPENDENT_AMBULATORY_CARE_PROVIDER_SITE_OTHER): Payer: Medicare Other | Admitting: Internal Medicine

## 2012-01-28 VITALS — BP 130/80 | HR 76 | Temp 98.0°F | Resp 16 | Ht 62.5 in | Wt 144.0 lb

## 2012-01-28 DIAGNOSIS — T887XXA Unspecified adverse effect of drug or medicament, initial encounter: Secondary | ICD-10-CM

## 2012-01-28 DIAGNOSIS — Z96659 Presence of unspecified artificial knee joint: Secondary | ICD-10-CM

## 2012-01-28 DIAGNOSIS — B351 Tinea unguium: Secondary | ICD-10-CM

## 2012-01-28 DIAGNOSIS — D62 Acute posthemorrhagic anemia: Secondary | ICD-10-CM

## 2012-01-28 DIAGNOSIS — Z96651 Presence of right artificial knee joint: Secondary | ICD-10-CM | POA: Insufficient documentation

## 2012-01-28 LAB — BASIC METABOLIC PANEL
BUN: 17 mg/dL (ref 6–23)
CO2: 26 mEq/L (ref 19–32)
Chloride: 104 mEq/L (ref 96–112)
Creatinine, Ser: 0.9 mg/dL (ref 0.4–1.2)

## 2012-01-28 LAB — CBC WITH DIFFERENTIAL/PLATELET
Basophils Absolute: 0 10*3/uL (ref 0.0–0.1)
Basophils Relative: 0.8 % (ref 0.0–3.0)
Hemoglobin: 12.6 g/dL (ref 12.0–15.0)
Lymphocytes Relative: 38.9 % (ref 12.0–46.0)
Monocytes Relative: 8.2 % (ref 3.0–12.0)
Neutro Abs: 2.3 10*3/uL (ref 1.4–7.7)
RBC: 4.48 Mil/uL (ref 3.87–5.11)
WBC: 4.8 10*3/uL (ref 4.5–10.5)

## 2012-01-28 MED ORDER — TERBINAFINE HCL 250 MG PO TABS: 250.0000 mg | ORAL_TABLET | Freq: Every day | ORAL | Status: DC

## 2012-01-28 NOTE — Patient Instructions (Addendum)
The patient is instructed to continue all medications as prescribed. Schedule followup with check out clerk upon leaving the clinic  

## 2012-01-28 NOTE — Progress Notes (Signed)
  Subjective:    Patient ID: Leah [name], female    DOB: May 08, 1930, 78 y.o.   MRN: [id number]  HPI Patient is a 78 year old female status post replacement of her right knee there is an exceptionally well she has lost weight she has been exercising regularly and has resumed all normal activity pattern.  She has 2 reasons for followup today postoperative anemia for monitoring with a CBC differential and a history of gastroesophageal reflux as well as acute on chronic fungal toenail with pain and race female.     Review of Systems  Constitutional: Negative for activity change, appetite change and fatigue.  HENT: Negative for ear pain, congestion, neck pain, postnasal drip and sinus pressure.   Eyes: Negative for redness and visual disturbance.  Respiratory: Negative for cough, shortness of breath and wheezing.   Gastrointestinal: Negative for abdominal pain and abdominal distention.  Genitourinary: Negative for dysuria, frequency and menstrual problem.  Musculoskeletal: Negative for myalgias, joint swelling and arthralgias.  Skin: Negative for rash and wound.  Neurological: Negative for dizziness, weakness and headaches.  Hematological: Negative for adenopathy. Does not bruise/bleed easily.  Psychiatric/Behavioral: Negative for disturbed wake/sleep cycle and decreased concentration.       Objective:   Physical Exam  Nursing note and vitals reviewed. Constitutional: She is oriented to person, place, and time. She appears well-developed and well-nourished. No distress.  HENT:  Head: Normocephalic and atraumatic.  Right Ear: External ear normal.  Left Ear: External ear normal.  Nose: Nose normal.  Mouth/Throat: Oropharynx is clear and moist.  Eyes: Conjunctivae and EOM are normal. Pupils are equal, round, and reactive to light.  Neck: Normal range of motion. Neck supple. No JVD present. No tracheal deviation present. No thyromegaly present.  Cardiovascular: Normal rate, regular  rhythm, normal heart sounds and intact distal pulses.   No murmur heard. Pulmonary/Chest: Effort normal and breath sounds normal. She has no wheezes. She exhibits no tenderness.  Abdominal: Soft. Bowel sounds are normal.  Musculoskeletal: She exhibits no edema and no tenderness.       Surgical changes to the right knee  Lymphadenopathy:    She has no cervical adenopathy.  Neurological: She is alert and oriented to person, place, and time. She has normal reflexes. No cranial nerve deficit.  Skin: Skin is warm and dry. She is not diaphoretic.       Fungal involvement of the great toenails bilaterally  Psychiatric: She has a normal mood and affect. Her behavior is normal.          Assessment & Plan:  Will treat with Lamisil 250 mg by mouth daily for 90 days continue vinegar soaks Blood pressure stable on current medications measure up iron and CBC for postoperative anemia also check a creatinine for possible toxicity of pain medications given to her during the postoperative period.  Return to office in 3 months at which time liver function should be monitored

## 2012-03-02 NOTE — Progress Notes (Signed)
  Subjective:    Patient ID: Leah [name], female    DOB: 1929-08-11, 78 y.o.   MRN: [id number]  HPI Opened in error no charges   Review of Systems     Objective:   Physical Exam        Assessment & Plan:

## 2012-05-05 ENCOUNTER — Ambulatory Visit (INDEPENDENT_AMBULATORY_CARE_PROVIDER_SITE_OTHER): Payer: Medicare Other | Admitting: Internal Medicine

## 2012-05-05 ENCOUNTER — Encounter: Payer: Self-pay | Admitting: Internal Medicine

## 2012-05-05 VITALS — BP 130/74 | HR 72 | Temp 98.2°F | Resp 16 | Ht 62.5 in | Wt 149.0 lb

## 2012-05-05 DIAGNOSIS — M19049 Primary osteoarthritis, unspecified hand: Secondary | ICD-10-CM

## 2012-05-05 DIAGNOSIS — E785 Hyperlipidemia, unspecified: Secondary | ICD-10-CM

## 2012-05-05 DIAGNOSIS — M720 Palmar fascial fibromatosis [Dupuytren]: Secondary | ICD-10-CM

## 2012-05-05 MED ORDER — METHYLPREDNISOLONE ACETATE 40 MG/ML IJ SUSP: 40.0000 mg | Freq: Once | INTRAMUSCULAR | Status: DC

## 2012-05-05 NOTE — Progress Notes (Signed)
Subjective:    Patient ID: Leah [name], female    DOB: September 30, 1929, 78 y.o.   MRN: [id number]  HPI  Patient presents for routine followup and discussion of osteoarthritis in her hands as well as Dupuytren's contractures in both hands  Review of Systems  Constitutional: Negative for activity change, appetite change and fatigue.  HENT: Negative for ear pain, congestion, neck pain, postnasal drip and sinus pressure.   Eyes: Negative for redness and visual disturbance.  Respiratory: Negative for cough, shortness of breath and wheezing.   Gastrointestinal: Negative for abdominal pain and abdominal distention.  Genitourinary: Negative for dysuria, frequency and menstrual problem.  Musculoskeletal: Negative for myalgias, joint swelling and arthralgias.  Skin: Negative for rash and wound.  Neurological: Negative for dizziness, weakness and headaches.  Hematological: Negative for adenopathy. Does not bruise/bleed easily.  Psychiatric/Behavioral: Negative for sleep disturbance and decreased concentration.   Past Medical History  Diagnosis Date  . GERD (gastroesophageal reflux disease)   . Acute maxillary sinusitis   . Unspecified adverse effect of unspecified drug, medicinal and biological substance   . Basal cell carcinoma of nose   . Localized osteoarthrosis not specified whether primary or secondary, lower leg   . Hyperlipidemia   . Osteoporosis   . Osteopenia   . DUB (dysfunctional uterine bleeding)   . Endometrial polyp   . Uterine prolapse   . Cystocele   . Detrusor instability   . Glaucoma(365)   . PONV (postoperative nausea and vomiting)     19 yrs ago- states has had general anesthesia since and did well  . Carpal tunnel syndrome   . Bundle branch block, unspecified     EKG 4/13 with clearance and note that isnt new block Dr Lovell Sheehan on chart    History   Social History  . Marital Status: Married    Spouse Name: N/A    Number of Children: N/A  . Years of  Education: N/A   Occupational History  . Not on file.   Social History Main Topics  . Smoking status: Former Smoker    Quit date: 10/06/1958  . Smokeless tobacco: Never Used  . Alcohol Use: Yes     Comment: 1 glass wine week  . Drug Use: No  . Sexually Active: No   Other Topics Concern  . Not on file   Social History Narrative  . No narrative on file    Past Surgical History  Procedure Date  . Arthroscopic r knee   . Flexible sigmoidoscopy   . Total hip rerplacement bilateral   . Dilation and curettage of uterus 1982  . Carpal tunnel release 2011  . Hysteroscopy w/d&c 2005 and 2008  . Eye surgery     bilateral cataract extraction  with IOL  . Joint replacement     bilateral total hip arthroplasty  . Total knee arthroplasty 10/13/2011    Procedure: TOTAL KNEE ARTHROPLASTY;  Surgeon: Loanne Drilling, MD;  Location: [facility];  Service: Orthopedics;  Laterality: Right;  . Hysteroscopy     Family History  Problem Relation Age of Onset  . Cancer Mother   . Dementia Mother   . Heart disease Father   . Stroke Father   . Dementia Sister     Allergies  Allergen Reactions  . Sulfa Antibiotics     RASH  . Nabumetone Other (See Comments)    Feel weird    . Naproxen Sodium Hives  . Tramadol Hcl     REACTION:  caused nausea and vomiting    Current Outpatient Prescriptions on File Prior to Visit  Medication Sig Dispense Refill  . acetaminophen (TYLENOL) 500 MG tablet Take 1,000 mg by mouth every 6 (six) hours as needed.      . latanoprost (XALATAN) 0.005 % ophthalmic solution Place 1 drop into both eyes at bedtime.       . mometasone (NASONEX) 50 MCG/ACT nasal spray Place 2 sprays into the nose daily.       Marland Kitchen omeprazole (PRILOSEC) 20 MG capsule Take 20 mg by mouth daily.       Marland Kitchen terbinafine (LAMISIL) 250 MG tablet Take 1 tablet (250 mg total) by mouth daily.  30 tablet  2   No current facility-administered medications on file prior to visit.    BP 130/74  Pulse 72   Temp 98.2 F (36.8 C)  Resp 16  Ht 5' 2.5" (1.588 m)  Wt 149 lb (67.586 kg)  BMI 26.82 kg/m2       Objective:   Physical Exam  Nursing note and vitals reviewed. Constitutional: She is oriented to person, place, and time. She appears well-developed and well-nourished. No distress.  HENT:  Head: Normocephalic and atraumatic.  Right Ear: External ear normal.  Left Ear: External ear normal.  Nose: Nose normal.  Mouth/Throat: Oropharynx is clear and moist.  Eyes: Conjunctivae normal and EOM are normal. Pupils are equal, round, and reactive to light.  Neck: Normal range of motion. Neck supple. No JVD present. No tracheal deviation present. No thyromegaly present.  Cardiovascular: Normal rate, regular rhythm, normal heart sounds and intact distal pulses.   No murmur heard. Pulmonary/Chest: Effort normal and breath sounds normal. She has no wheezes. She exhibits no tenderness.  Abdominal: Soft. Bowel sounds are normal.  Musculoskeletal: Normal range of motion. She exhibits no edema and no tenderness.  Lymphadenopathy:    She has no cervical adenopathy.  Neurological: She is alert and oriented to person, place, and time. She has normal reflexes. No cranial nerve deficit.  Skin: Skin is warm and dry. She is not diaphoretic.  Psychiatric: She has a normal mood and affect. Her behavior is normal.          Assessment & Plan:   Informed consent obtained and the patient's tendons in the right and left handswere prepped with betadine. Local anesthesia was obtained with topical spray. Then 10 mg of Depo-Medrol and 1/8 cc of lidocaine was injected into the tendon sheaths . The patient tolerated the procedure without complications. Post injection care discussed with patient. 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  We discussed wearing padded gloves and gave injections today.

## 2012-05-05 NOTE — Patient Instructions (Addendum)
Foot soaks

## 2012-08-27 ENCOUNTER — Encounter: Payer: Self-pay | Admitting: Internal Medicine

## 2012-08-27 ENCOUNTER — Telehealth: Payer: Self-pay | Admitting: *Deleted

## 2012-08-27 ENCOUNTER — Ambulatory Visit (INDEPENDENT_AMBULATORY_CARE_PROVIDER_SITE_OTHER): Payer: Self-pay | Admitting: Internal Medicine

## 2012-08-27 VITALS — BP 100/64 | HR 120 | Temp 99.9°F | Resp 18 | Wt 148.0 lb

## 2012-08-27 DIAGNOSIS — J069 Acute upper respiratory infection, unspecified: Secondary | ICD-10-CM

## 2012-08-27 DIAGNOSIS — J309 Allergic rhinitis, unspecified: Secondary | ICD-10-CM

## 2012-08-27 MED ORDER — HYDROCODONE-HOMATROPINE 5-1.5 MG/5ML PO SYRP: 5.0000 mL | ORAL_SOLUTION | Freq: Four times a day (QID) | ORAL | Status: AC | PRN

## 2012-08-27 NOTE — Patient Instructions (Addendum)
Take [phone] mg of Tylenol every 6 hours as needed for pain relief or fever.  Avoid taking more than 3000 mg in a 24-hour period (  This may cause liver damage).  Acute bronchitis symptoms for less than 10 days are generally not helped by antibiotics.  Take over-the-counter expectorants and cough medications such as  Mucinex DM.  Call if there is no improvement in 5 to 7 days or if he developed worsening cough, fever, or new symptoms, such as shortness of breath or chest pain.

## 2012-08-27 NOTE — Telephone Encounter (Signed)
Called [facility] pharmacy and spoke to Herbst the pharmacist and gave verbal order for Hycodan cough syrup, take 5 mls by mouth every 6 hours as needed for cough, disp 120 ml, refills 0. Brooke verbalized understanding.

## 2012-08-27 NOTE — Progress Notes (Signed)
Subjective:    Patient ID: Leah [name], female    DOB: April 26, 1930, 79 y.o.   MRN: [id number]  HPI  79 year old patient who presents with a five-day history of URI symptoms. This worsened 2 days ago with increasing sore throat some fever and chills. She's had a cough congestion and largely nonproductive cough. Temperature has been as high as 100. She has been using a number of over-the-counter medications including pseudoephedrine 240 mg daily in divided dosages  Past Medical History  Diagnosis Date  . GERD (gastroesophageal reflux disease)   . Acute maxillary sinusitis   . Unspecified adverse effect of unspecified drug, medicinal and biological substance   . Basal cell carcinoma of nose   . Localized osteoarthrosis not specified whether primary or secondary, lower leg   . Hyperlipidemia   . Osteoporosis   . Osteopenia   . DUB (dysfunctional uterine bleeding)   . Endometrial polyp   . Uterine prolapse   . Cystocele   . Detrusor instability   . Glaucoma(365)   . PONV (postoperative nausea and vomiting)     19 yrs ago- states has had general anesthesia since and did well  . Carpal tunnel syndrome   . Bundle branch block, unspecified     EKG 4/13 with clearance and note that isnt new block Dr Lovell Sheehan on chart    History   Social History  . Marital Status: Married    Spouse Name: N/A    Number of Children: N/A  . Years of Education: N/A   Occupational History  . Not on file.   Social History Main Topics  . Smoking status: Former Smoker    Quit date: 10/06/1958  . Smokeless tobacco: Never Used  . Alcohol Use: Yes     Comment: 1 glass wine week  . Drug Use: No  . Sexually Active: No   Other Topics Concern  . Not on file   Social History Narrative  . No narrative on file    Past Surgical History  Procedure Laterality Date  . Arthroscopic r knee    . Flexible sigmoidoscopy    . Total hip rerplacement bilateral    . Dilation and curettage of uterus  1982  .  Carpal tunnel release  2011  . Hysteroscopy w/d&c  2005 and 2008  . Eye surgery      bilateral cataract extraction  with IOL  . Joint replacement      bilateral total hip arthroplasty  . Total knee arthroplasty  10/13/2011    Procedure: TOTAL KNEE ARTHROPLASTY;  Surgeon: Loanne Drilling, MD;  Location: [facility];  Service: Orthopedics;  Laterality: Right;  . Hysteroscopy      Family History  Problem Relation Age of Onset  . Cancer Mother   . Dementia Mother   . Heart disease Father   . Stroke Father   . Dementia Sister     Allergies  Allergen Reactions  . Sulfa Antibiotics     RASH  . Nabumetone Other (See Comments)    Feel weird    . Naproxen Sodium Hives  . Tramadol Hcl     REACTION: caused nausea and vomiting    Current Outpatient Prescriptions on File Prior to Visit  Medication Sig Dispense Refill  . acetaminophen (TYLENOL) 500 MG tablet Take 1,000 mg by mouth every 6 (six) hours as needed.      . latanoprost (XALATAN) 0.005 % ophthalmic solution Place 1 drop into both eyes at bedtime.       Marland Kitchen  mometasone (NASONEX) 50 MCG/ACT nasal spray Place 2 sprays into the nose daily.       Marland Kitchen omeprazole (PRILOSEC) 20 MG capsule Take 20 mg by mouth daily.        No current facility-administered medications on file prior to visit.    BP 100/64  Pulse 120  Temp(Src) 99.9 F (37.7 C) (Oral)  Resp 18  Wt 148 lb (67.132 kg)  BMI 26.62 kg/m2  SpO2 96%       Review of Systems  Constitutional: Positive for fever, chills, activity change, appetite change and fatigue.  HENT: Positive for rhinorrhea, postnasal drip and sinus pressure. Negative for hearing loss, congestion, sore throat, dental problem and tinnitus.   Eyes: Negative for pain, discharge and visual disturbance.  Respiratory: Positive for cough. Negative for shortness of breath.   Cardiovascular: Negative for chest pain, palpitations and leg swelling.  Gastrointestinal: Negative for nausea, vomiting, abdominal pain,  diarrhea, constipation, blood in stool and abdominal distention.  Genitourinary: Negative for dysuria, urgency, frequency, hematuria, flank pain, vaginal bleeding, vaginal discharge, difficulty urinating, vaginal pain and pelvic pain.  Musculoskeletal: Negative for joint swelling, arthralgias and gait problem.  Skin: Negative for rash.  Neurological: Negative for dizziness, syncope, speech difficulty, weakness, numbness and headaches.  Hematological: Negative for adenopathy.  Psychiatric/Behavioral: Negative for behavioral problems, dysphoric mood and agitation. The patient is not nervous/anxious.        Objective:   Physical Exam  Constitutional: She is oriented to person, place, and time. She appears well-developed and well-nourished.  HENT:  Head: Normocephalic.  Right Ear: External ear normal.  Left Ear: External ear normal.  Mouth/Throat: Oropharynx is clear and moist.  Eyes: Conjunctivae and EOM are normal. Pupils are equal, round, and reactive to light.  Neck: Normal range of motion. Neck supple. No thyromegaly present.  Cardiovascular: Normal rate, regular rhythm, normal heart sounds and intact distal pulses.   Pulmonary/Chest: Effort normal and breath sounds normal.  Abdominal: Soft. Bowel sounds are normal. She exhibits no mass. There is no tenderness.  Musculoskeletal: Normal range of motion.  Lymphadenopathy:    She has no cervical adenopathy.  Neurological: She is alert and oriented to person, place, and time.  Skin: Skin is warm and dry. No rash noted.  Psychiatric: She has a normal mood and affect. Her behavior is normal.          Assessment & Plan:  Viral URI. Have recommended Robitussin and Tylenol only along with Hydromet. History of allergic rhinitis

## 2012-09-06 ENCOUNTER — Telehealth: Payer: Self-pay | Admitting: Internal Medicine

## 2012-09-06 NOTE — Telephone Encounter (Signed)
Patient Information:  Caller Name: Sarann  Phone: [phone]  Patient: Leah [name], Leah [name]  Gender: Female  DOB: Jul 11, 1929  Age: 79 Years  PCP: [name] (Adults only)  Office Follow Up:  Does the office need to follow up with this patient?: No  Instructions For The Office: N/A   Symptoms  Reason For Call & Symptoms: pt was seen in the office on 08/27/12 .  pt states she called earlier this am but since then she is feeling better.  Reviewed Health History In EMR: Yes  Reviewed Medications In EMR: Yes  Reviewed Allergies In EMR: Yes  Reviewed Surgeries / Procedures: Yes  Date of Onset of Symptoms: 08/27/2012  Treatments Tried: cough medication (hydrodan)  Treatments Tried Worked: No  Guideline(s) Used:  Cough  Disposition Per Guideline:   See Within 3 Days in Office  Reason For Disposition Reached:   Cough has been present for > 10 days  Advice Given:  Coughing Spasms:  Drink warm fluids. Inhale warm mist (Reason: both relax the airway and loosen up the phlegm).  Prevent Dehydration:  Drink adequate liquids.  Call Back If:  You become worse.  For a Stuffy Nose - Use Nasal Washes:  Introduction: Saline (salt water) nasal irrigation (nasal wash) is an effective and simple home remedy for treating stuffy nose and sinus congestion. The nose can be irrigated by pouring, spraying, or squirting salt water into the nose and then letting it run back out.  Patient Refused Recommendation:  Patient Refused Appt, Patient Requests Appt At Later Date  pt states she is feeling better today and will wait to schedule an appt.  if symptoms worsen she will call back for an appt.

## 2012-09-11 ENCOUNTER — Encounter: Payer: Self-pay | Admitting: Family Medicine

## 2012-09-11 ENCOUNTER — Ambulatory Visit (INDEPENDENT_AMBULATORY_CARE_PROVIDER_SITE_OTHER): Payer: Medicare PPO | Admitting: Family Medicine

## 2012-09-11 VITALS — BP 142/80 | HR 98 | Temp 97.6°F | Ht 63.0 in | Wt 147.5 lb

## 2012-09-11 DIAGNOSIS — J019 Acute sinusitis, unspecified: Secondary | ICD-10-CM

## 2012-09-11 MED ORDER — FLUTICASONE PROPIONATE 50 MCG/ACT NA SUSP: NASAL | Status: DC

## 2012-09-11 MED ORDER — CEFUROXIME AXETIL 500 MG PO TABS: 500.0000 mg | ORAL_TABLET | Freq: Two times a day (BID) | ORAL | Status: AC

## 2012-09-11 NOTE — Patient Instructions (Signed)

## 2012-09-11 NOTE — Progress Notes (Signed)
  Subjective:     Leah [name] is a 79 y.o. female who presents for evaluation of sinus pain. Symptoms include: congestion, cough, facial pain, fevers, nasal congestion and sinus pressure. Onset of symptoms was 3 weeks ago. Symptoms have been gradually worsening since that time. Past history is significant for no history of pneumonia or bronchitis. Patient is a non-smoker.  The following portions of the patient's history were reviewed and updated as appropriate: allergies, current medications, past family history, past medical history, past social history, past surgical history and problem list.  Review of Systems Pertinent items are noted in HPI.   Objective:    There were no vitals taken for this visit. General appearance: alert, cooperative, appears stated age and no distress Ears: normal TM's and external ear canals both ears Nose: green discharge, moderate congestion, turbinates red, swollen, sinus tenderness bilateral Throat: abnormal findings: exudates present and marked oropharyngeal erythema Neck: mild anterior cervical adenopathy, supple, symmetrical, trachea midline and thyroid not enlarged, symmetric, no tenderness/mass/nodules Lungs: clear to auscultation bilaterally Heart: S1, S2 normal    Assessment:    Acute bacterial sinusitis.    Plan:    Nasal steroids per medication orders. Antihistamines per medication orders. Ceftin per medication orders.

## 2012-10-14 ENCOUNTER — Other Ambulatory Visit: Payer: Self-pay | Admitting: Internal Medicine

## 2012-11-10 ENCOUNTER — Other Ambulatory Visit: Payer: Self-pay | Admitting: *Deleted

## 2012-11-10 ENCOUNTER — Encounter: Payer: Self-pay | Admitting: Internal Medicine

## 2012-11-10 ENCOUNTER — Ambulatory Visit (INDEPENDENT_AMBULATORY_CARE_PROVIDER_SITE_OTHER): Payer: Medicare PPO | Admitting: Internal Medicine

## 2012-11-10 VITALS — BP 138/80 | HR 76 | Temp 97.9°F | Resp 16 | Ht 63.0 in | Wt 145.0 lb

## 2012-11-10 DIAGNOSIS — K219 Gastro-esophageal reflux disease without esophagitis: Secondary | ICD-10-CM

## 2012-11-10 DIAGNOSIS — Z0000 Encounter for general adult medical examination without abnormal findings: Secondary | ICD-10-CM

## 2012-11-10 DIAGNOSIS — E785 Hyperlipidemia, unspecified: Secondary | ICD-10-CM

## 2012-11-10 LAB — CBC WITH DIFFERENTIAL/PLATELET
Basophils Relative: 0.8 % (ref 0.0–3.0)
Eosinophils Absolute: 0.2 10*3/uL (ref 0.0–0.7)
HCT: 39.5 % (ref 36.0–46.0)
Lymphs Abs: 2.4 10*3/uL (ref 0.7–4.0)
MCHC: 33.6 g/dL (ref 30.0–36.0)
MCV: 91.1 fl (ref 78.0–100.0)
Monocytes Absolute: 0.4 10*3/uL (ref 0.1–1.0)
Neutrophils Relative %: 43.6 % (ref 43.0–77.0)
RBC: 4.33 Mil/uL (ref 3.87–5.11)

## 2012-11-10 LAB — POCT URINALYSIS DIPSTICK
Blood, UA: NEGATIVE
Ketones, UA: NEGATIVE
Protein, UA: NEGATIVE
Spec Grav, UA: 1.02
Urobilinogen, UA: 0.2
pH, UA: 7

## 2012-11-10 LAB — HEPATIC FUNCTION PANEL
AST: 26 U/L (ref 0–37)
Albumin: 3.7 g/dL (ref 3.5–5.2)
Total Protein: 6.9 g/dL (ref 6.0–8.3)

## 2012-11-10 LAB — BASIC METABOLIC PANEL
BUN: 21 mg/dL (ref 6–23)
GFR: 59.37 mL/min — ABNORMAL LOW (ref >60.00)
Glucose, Bld: 84 mg/dL (ref 70–99)
Potassium: 4.2 mEq/L (ref 3.5–5.1)

## 2012-11-10 LAB — LIPID PANEL
Cholesterol: 216 mg/dL — ABNORMAL HIGH (ref 0–200)
VLDL: 18 mg/dL (ref 0.0–40.0)

## 2012-11-10 NOTE — Progress Notes (Signed)
Subjective:    Patient ID: Leah [name], female    DOB: 1930/05/06, 79 y.o.   MRN: [id number]  HPI Sinus infection with head aches in the spring Possible allergies Responded to antibiotic reviewed problem list Fungal nails TKR status Stable exercise    Review of Systems  Constitutional: Negative for activity change, appetite change and fatigue.  HENT: Negative for ear pain, congestion, neck pain, postnasal drip and sinus pressure.   Eyes: Negative for redness and visual disturbance.  Respiratory: Negative for cough, shortness of breath and wheezing.   Gastrointestinal: Negative for abdominal pain and abdominal distention.  Genitourinary: Negative for dysuria, frequency and menstrual problem.  Musculoskeletal: Negative for myalgias, joint swelling and arthralgias.  Skin: Negative for rash and wound.  Neurological: Negative for dizziness, weakness and headaches.  Hematological: Negative for adenopathy. Does not bruise/bleed easily.  Psychiatric/Behavioral: Negative for sleep disturbance and decreased concentration.       Past Medical History  Diagnosis Date  . GERD (gastroesophageal reflux disease)   . Acute maxillary sinusitis   . Unspecified adverse effect of unspecified drug, medicinal and biological substance   . Basal cell carcinoma of nose   . Localized osteoarthrosis not specified whether primary or secondary, lower leg   . Hyperlipidemia   . Osteoporosis   . Osteopenia   . DUB (dysfunctional uterine bleeding)   . Endometrial polyp   . Uterine prolapse   . Cystocele   . Detrusor instability   . Glaucoma   . PONV (postoperative nausea and vomiting)     19 yrs ago- states has had general anesthesia since and did well  . Carpal tunnel syndrome   . Bundle branch block, unspecified     EKG 4/13 with clearance and note that isnt new block Dr Lovell Sheehan on chart    History   Social History  . Marital Status: Married    Spouse Name: N/A    Number of Children:  N/A  . Years of Education: N/A   Occupational History  . Not on file.   Social History Main Topics  . Smoking status: Former Smoker    Quit date: 10/06/1958  . Smokeless tobacco: Never Used  . Alcohol Use: Yes     Comment: 1 glass wine week  . Drug Use: No  . Sexually Active: No   Other Topics Concern  . Not on file   Social History Narrative  . No narrative on file    Past Surgical History  Procedure Laterality Date  . Arthroscopic r knee    . Flexible sigmoidoscopy    . Total hip rerplacement bilateral    . Dilation and curettage of uterus  1982  . Carpal tunnel release  2011  . Hysteroscopy w/d&c  2005 and 2008  . Eye surgery      bilateral cataract extraction  with IOL  . Joint replacement      bilateral total hip arthroplasty  . Total knee arthroplasty  10/13/2011    Procedure: TOTAL KNEE ARTHROPLASTY;  Surgeon: Loanne Drilling, MD;  Location: [facility];  Service: Orthopedics;  Laterality: Right;  . Hysteroscopy      Family History  Problem Relation Age of Onset  . Cancer Mother   . Dementia Mother   . Heart disease Father   . Stroke Father   . Dementia Sister     Allergies  Allergen Reactions  . Sulfa Antibiotics     RASH  . Nabumetone Other (See Comments)  Feel weird    . Naproxen Sodium Hives  . Tramadol Hcl     REACTION: caused nausea and vomiting    Current Outpatient Prescriptions on File Prior to Visit  Medication Sig Dispense Refill  . acetaminophen (TYLENOL) 500 MG tablet Take 1,000 mg by mouth every 6 (six) hours as needed.      . latanoprost (XALATAN) 0.005 % ophthalmic solution Place 1 drop into both eyes at bedtime.       . meloxicam (MOBIC) 15 MG tablet TAKE 1 TABLET EACH DAY.  90 tablet  3  . omeprazole (PRILOSEC) 20 MG capsule Take 20 mg by mouth daily as needed.        No current facility-administered medications on file prior to visit.    BP 138/80  Pulse 76  Temp(Src) 97.9 F (36.6 C)  Resp 16  Ht 5\' 3"  (1.6 m)  Wt 145  lb (65.772 kg)  BMI 25.69 kg/m2    Objective:   Physical Exam  Nursing note and vitals reviewed. Constitutional: She is oriented to person, place, and time. She appears well-developed and well-nourished. No distress.  HENT:  Head: Normocephalic and atraumatic.  Right Ear: External ear normal.  Left Ear: External ear normal.  Nose: Nose normal.  Mouth/Throat: Oropharynx is clear and moist.  Eyes: Conjunctivae and EOM are normal. Pupils are equal, round, and reactive to light.  Neck: Normal range of motion. Neck supple. No JVD present. No tracheal deviation present. No thyromegaly present.  Cardiovascular: Normal rate and regular rhythm.   No murmur heard. Pulmonary/Chest: Effort normal and breath sounds normal. She has no wheezes. She exhibits no tenderness.  Abdominal: Soft. Bowel sounds are normal.  Musculoskeletal: Normal range of motion. She exhibits no edema and no tenderness.  Lymphadenopathy:    She has no cervical adenopathy.  Neurological: She is alert and oriented to person, place, and time. She has normal reflexes. No cranial nerve deficit.  Skin: Skin is warm and dry. She is not diaphoretic.  Psychiatric: She has a normal mood and affect. Her behavior is normal.          Assessment & Plan:   This is a routine physical examination for this healthy  Female. Reviewed all health maintenance protocols including mammography colonoscopy bone density and reviewed appropriate screening labs. Her immunization history was reviewed as well as her current medications and allergies refills of her chronic medications were given and the plan for yearly health maintenance was discussed all orders and referrals were made as appropriate. Continued use of vinegar in water soaks Post one year TKR and very stable Stable glaucoma on drops Key to her health is the exercise

## 2012-11-10 NOTE — Patient Instructions (Signed)
The patient is instructed to continue all medications as prescribed. Schedule followup with check out clerk upon leaving the clinic  

## 2013-01-05 ENCOUNTER — Encounter: Payer: Self-pay | Admitting: Gynecology

## 2013-01-20 ENCOUNTER — Encounter: Payer: Self-pay | Admitting: Gynecology

## 2013-01-25 ENCOUNTER — Ambulatory Visit (INDEPENDENT_AMBULATORY_CARE_PROVIDER_SITE_OTHER): Payer: Medicare PPO | Admitting: Gynecology

## 2013-01-25 ENCOUNTER — Encounter: Payer: Self-pay | Admitting: Gynecology

## 2013-01-25 VITALS — BP 120/80 | Ht 62.0 in | Wt 146.0 lb

## 2013-01-25 DIAGNOSIS — N814 Uterovaginal prolapse, unspecified: Secondary | ICD-10-CM

## 2013-01-25 DIAGNOSIS — M899 Disorder of bone, unspecified: Secondary | ICD-10-CM

## 2013-01-25 DIAGNOSIS — N952 Postmenopausal atrophic vaginitis: Secondary | ICD-10-CM

## 2013-01-25 DIAGNOSIS — M8580 Other specified disorders of bone density and structure, unspecified site: Secondary | ICD-10-CM

## 2013-01-25 DIAGNOSIS — N8111 Cystocele, midline: Secondary | ICD-10-CM

## 2013-01-25 NOTE — Progress Notes (Signed)
Leah [name] [birth year] [id number]        79 y.o.  [id number] for followup exam.  Former patient of Dr. Eda Paschal with several issues below.  Past medical history,surgical history, medications, allergies, family history and social history were all reviewed and documented in the EPIC chart.  ROS:  Performed and pertinent positives and negatives are included in the history, assessment and plan .  Exam: Leah [name] assistant Filed Vitals:   01/25/13 1401  BP: 120/80  Height: 5\' 2"  (1.575 m)  Weight: 146 lb (66.225 kg)   General appearance  Normal Skin grossly normal Head/Neck normal with no cervical or supraclavicular adenopathy thyroid normal Lungs  clear Cardiac RR, without RMG Abdominal  soft, nontender, without masses, organomegaly or hernia Breasts  examined lying and sitting without masses, retractions, discharge or axillary adenopathy. Pelvic  Ext/BUS/vagina  atrophic changes with second degree cystocele and first degree uterine prolapse. No significant rectocele.  Cervix  normal with atrophic changes  Uterus  axial to anteverted, normal size, shape and contour, midline and mobile nontender   Adnexa  Without masses or tenderness    Anus and perineum  normal   Rectovaginal  normal sphincter tone without palpated masses or tenderness.    Assessment/Plan:  79 y.o. [name] female for annual exam.   1. Cystocele/uterine prolapse. Mildly symptomatic with some pressure symptoms but stable over years out following. Options include observation, pessary and surgery discussed. Patient is not interested in doing anything but prefers just to monitor. Will check urinalysis today for asymptomatic bacteriuria. 2. Atrophic changes. Patient's not symptomatic without vaginal dryness or dyspareunia. We'll continue to monitor. 3. Osteopenia. DEXA 12/2011 with T score -2.3. Had been on Fosamax and Boniva between 2003-2011 and is on a drug-free holiday with a stable bone density. No history of fractures.  We'll repeat DEXA next year at 2 year interval. Increase calcium vitamin D reviewed. 4. Mammography 12/2012. Continued annual mammography. SBE monthly reviewed. 5. Colonoscopy 2009. Recommended repeat interval 10 years. 6. Pap smear 2009. No Pap smear done today. No history of abnormal Pap smears. Reviewed current screening guidelines and we have decided to stop screening as she is over the age of 24. 7. Health maintenance. No blood work done as it is all done through her primary physician's office. Followup one year, sooner as needed.   Note: This document was prepared with digital dictation and possible smart phrase technology. Any transcriptional errors that result from this process are unintentional.   Leah Lords MD, 2:25 PM 01/25/2013

## 2013-01-25 NOTE — Patient Instructions (Signed)
Follow up in one year for annual exam 

## 2013-01-26 LAB — URINALYSIS W MICROSCOPIC + REFLEX CULTURE
Bacteria, UA: NONE SEEN
Bilirubin Urine: NEGATIVE
Casts: NONE SEEN
Crystals: NONE SEEN
Glucose, UA: NEGATIVE mg/dL
Hgb urine dipstick: NEGATIVE
Ketones, ur: NEGATIVE mg/dL
Specific Gravity, Urine: 1.023 (ref 1.005–1.030)
pH: 5 (ref 5.0–8.0)

## 2013-01-27 LAB — URINE CULTURE: Colony Count: NO GROWTH

## 2013-02-01 ENCOUNTER — Encounter: Payer: Self-pay | Admitting: Obstetrics and Gynecology

## 2013-02-21 ENCOUNTER — Telehealth: Payer: Self-pay | Admitting: Internal Medicine

## 2013-02-21 NOTE — Telephone Encounter (Signed)
yes

## 2013-02-21 NOTE — Telephone Encounter (Signed)
The patient called and is hoping to be worked in as a new patient of Dr.Jones. She stated she mentioned this to Dr.Jones and he verbally agreed.  Just wanted to make sure this was okay.    Thanks!

## 2013-02-23 NOTE — Telephone Encounter (Signed)
Patient scheduled.

## 2013-03-04 ENCOUNTER — Encounter: Payer: Self-pay | Admitting: Internal Medicine

## 2013-03-04 ENCOUNTER — Ambulatory Visit (INDEPENDENT_AMBULATORY_CARE_PROVIDER_SITE_OTHER): Payer: Medicare PPO | Admitting: Internal Medicine

## 2013-03-04 VITALS — BP 132/82 | HR 82 | Temp 98.9°F | Resp 16 | Ht 63.0 in | Wt 151.1 lb

## 2013-03-04 DIAGNOSIS — M720 Palmar fascial fibromatosis [Dupuytren]: Secondary | ICD-10-CM

## 2013-03-04 DIAGNOSIS — J309 Allergic rhinitis, unspecified: Secondary | ICD-10-CM

## 2013-03-04 MED ORDER — AZELASTINE-FLUTICASONE 137-50 MCG/ACT NA SUSP: 1.0000 | Freq: Two times a day (BID) | NASAL | Status: DC

## 2013-03-04 NOTE — Patient Instructions (Addendum)
Allergic Rhinitis Allergic rhinitis is when the mucous membranes in the nose respond to allergens. Allergens are particles in the air that cause your body to have an allergic reaction. This causes you to release allergic antibodies. Through a chain of events, these eventually cause you to release histamine into the blood stream (hence the use of antihistamines). Although meant to be protective to the body, it is this release that causes your discomfort, such as frequent sneezing, congestion and an itchy runny nose.  CAUSES  The pollen allergens may come from grasses, trees, and weeds. This is seasonal allergic rhinitis, or "hay fever." Other allergens cause year-round allergic rhinitis (perennial allergic rhinitis) such as house dust mite allergen, pet dander and mold spores.  SYMPTOMS   Nasal stuffiness (congestion).  Runny, itchy nose with sneezing and tearing of the eyes.  There is often an itching of the mouth, eyes and ears. It cannot be cured, but it can be controlled with medications. DIAGNOSIS  If you are unable to determine the offending allergen, skin or blood testing may find it. TREATMENT   Avoid the allergen.  Medications and allergy shots (immunotherapy) can help.  Hay fever may often be treated with antihistamines in pill or nasal spray forms. Antihistamines block the effects of histamine. There are over-the-counter medicines that may help with nasal congestion and swelling around the eyes. Check with your caregiver before taking or giving this medicine. If the treatment above does not work, there are many new medications your caregiver can prescribe. Stronger medications may be used if initial measures are ineffective. Desensitizing injections can be used if medications and avoidance fails. Desensitization is when a patient is given ongoing shots until the body becomes less sensitive to the allergen. Make sure you follow up with your caregiver if problems continue. SEEK MEDICAL  CARE IF:   You develop fever (more than 100.5 F (38.1 C).  You develop a cough that does not stop easily (persistent).  You have shortness of breath.  You start wheezing.  Symptoms interfere with normal daily activities. Document Released: 02/11/2001 Document Revised: 08/11/2011 Document Reviewed: 08/23/2008 Exit[employer] Patient Information 2014 [employer].  

## 2013-03-04 NOTE — Progress Notes (Signed)
Subjective:    Patient ID: Leah [name], female    DOB: 1929-07-10, 79 y.o.   MRN: [id number]  HPI  New to me she complains of a 6 month history of nasal congestion and runny nose.  Review of Systems  Constitutional: Negative.   HENT: Positive for congestion, rhinorrhea, sneezing and postnasal drip. Negative for nosebleeds, sore throat, facial swelling, trouble swallowing, dental problem and sinus pressure.   Eyes: Negative.   Respiratory: Negative.  Negative for cough, chest tightness, shortness of breath, wheezing and stridor.   Cardiovascular: Negative.  Negative for chest pain, palpitations and leg swelling.  Gastrointestinal: Negative.   Endocrine: Negative.   Genitourinary: Negative.   Musculoskeletal: Positive for arthralgias (bilateral hand pain L>>R for several years). Negative for myalgias, back pain, joint swelling and gait problem.  Skin: Negative.   Allergic/Immunologic: Negative.   Neurological: Negative.  Negative for dizziness.  Hematological: Negative.  Negative for adenopathy. Does not bruise/bleed easily.  Psychiatric/Behavioral: Negative.        Objective:   Physical Exam  Vitals reviewed. Constitutional: She is oriented to person, place, and time. She appears well-developed and well-nourished. No distress.  HENT:  Head: Normocephalic and atraumatic.  Right Ear: Hearing, tympanic membrane, external ear and ear canal normal.  Left Ear: Hearing, tympanic membrane, external ear and ear canal normal.  Nose: Mucosal edema and rhinorrhea present. Right sinus exhibits no maxillary sinus tenderness and no frontal sinus tenderness. Left sinus exhibits no maxillary sinus tenderness and no frontal sinus tenderness.  Mouth/Throat: Oropharynx is clear and moist and mucous membranes are normal. Mucous membranes are not pale, not dry and not cyanotic. No oropharyngeal exudate, posterior oropharyngeal edema, posterior oropharyngeal erythema or tonsillar abscesses.  Eyes:  Conjunctivae are normal. Right eye exhibits no discharge. Left eye exhibits no discharge. No scleral icterus.  Neck: Normal range of motion. Neck supple. No JVD present. No tracheal deviation present. No thyromegaly present.  Cardiovascular: Normal rate, regular rhythm, normal heart sounds and intact distal pulses.  Exam reveals no gallop and no friction rub.   No murmur heard. Pulmonary/Chest: Effort normal and breath sounds normal. No stridor. No respiratory distress. She has no wheezes. She has no rales. She exhibits no tenderness.  Abdominal: Soft. Bowel sounds are normal. She exhibits no distension and no mass. There is no tenderness. There is no rebound and no guarding.  Musculoskeletal: Normal range of motion. She exhibits no edema and no tenderness.       Right hand: She exhibits deformity. She exhibits normal range of motion, no tenderness, no bony tenderness, normal two-point discrimination, normal capillary refill, no laceration and no swelling. Normal strength noted.       Left hand: She exhibits deformity. She exhibits normal range of motion, no tenderness, no bony tenderness, normal two-point discrimination, normal capillary refill, no laceration and no swelling. Normal strength noted.       Hands: Lymphadenopathy:    She has no cervical adenopathy.  Neurological: She is oriented to person, place, and time.  Skin: Skin is warm and dry. No rash noted. She is not diaphoretic. No erythema. No pallor.  Psychiatric: She has a normal mood and affect. Her behavior is normal. Judgment and thought content normal.     Lab Results  Component Value Date   WBC 5.5 11/10/2012   HGB 13.3 11/10/2012   HCT 39.5 11/10/2012   PLT 231.0 11/10/2012   GLUCOSE 84 11/10/2012   CHOL 216* 11/10/2012   TRIG 90.0 11/10/2012  HDL 97.90 11/10/2012   LDLDIRECT 97.0 11/10/2012   ALT 22 11/10/2012   AST 26 11/10/2012   NA 143 11/10/2012   K 4.2 11/10/2012   CL 108 11/10/2012   CREATININE 1.0 11/10/2012   BUN 21  11/10/2012   CO2 29 11/10/2012   TSH 2.02 11/10/2012   INR 0.91 10/06/2011       Assessment & Plan:

## 2013-03-04 NOTE — Assessment & Plan Note (Signed)
She will continue taking otc antihistamines and decongestants I have asked her to add dymista as well

## 2013-03-04 NOTE — Assessment & Plan Note (Signed)
She will see hand surgery about this

## 2013-09-08 ENCOUNTER — Ambulatory Visit (INDEPENDENT_AMBULATORY_CARE_PROVIDER_SITE_OTHER): Payer: Medicare PPO | Admitting: Internal Medicine

## 2013-09-08 ENCOUNTER — Encounter: Payer: Self-pay | Admitting: Internal Medicine

## 2013-09-08 VITALS — BP 138/70 | HR 79 | Temp 98.3°F | Resp 16 | Wt 150.0 lb

## 2013-09-08 DIAGNOSIS — H409 Unspecified glaucoma: Secondary | ICD-10-CM

## 2013-09-08 DIAGNOSIS — M810 Age-related osteoporosis without current pathological fracture: Secondary | ICD-10-CM

## 2013-09-08 DIAGNOSIS — J309 Allergic rhinitis, unspecified: Secondary | ICD-10-CM

## 2013-09-08 DIAGNOSIS — E785 Hyperlipidemia, unspecified: Secondary | ICD-10-CM

## 2013-09-08 DIAGNOSIS — M1990 Unspecified osteoarthritis, unspecified site: Secondary | ICD-10-CM

## 2013-09-08 DIAGNOSIS — Z23 Encounter for immunization: Secondary | ICD-10-CM | POA: Diagnosis not present

## 2013-09-08 NOTE — Patient Instructions (Signed)
Osteoarthritis Osteoarthritis is a disease that causes soreness and swelling (inflammation) of a joint. It occurs when the cartilage at the affected joint wears down. Cartilage acts as a cushion, covering the ends of bones where they meet to form a joint. Osteoarthritis is the most common form of arthritis. It often occurs in older people. The joints affected most often by this condition include those in the:  Ends of the fingers.  Thumbs.  Neck.  Lower back.  Knees.  Hips. CAUSES  Over time, the cartilage that covers the ends of bones begins to wear away. This causes bone to rub on bone, producing pain and stiffness in the affected joints.  RISK FACTORS Certain factors can increase your chances of having osteoarthritis, including:  Older age.  Excessive body weight.  Overuse of joints. SIGNS AND SYMPTOMS   Pain, swelling, and stiffness in the joint.  Over time, the joint may lose its normal shape.  Small deposits of bone (osteophytes) may grow on the edges of the joint.  Bits of bone or cartilage can break off and float inside the joint space. This may cause more pain and damage. DIAGNOSIS  Your health care provider will do a physical exam and ask about your symptoms. Various tests may be ordered, such as:  X-rays of the affected joint.  An MRI scan.  Blood tests to rule out other types of arthritis.  Joint fluid tests. This involves using a needle to draw fluid from the joint and examining the fluid under a microscope. TREATMENT  Goals of treatment are to control pain and improve joint function. Treatment plans may include:  A prescribed exercise program that allows for rest and joint relief.  A weight control plan.  Pain relief techniques, such as:  Properly applied heat and cold.  Electric pulses delivered to nerve endings under the skin (transcutaneous electrical nerve stimulation, TENS).  Massage.  Certain nutritional supplements.  Medicines to  control pain, such as:  Acetaminophen.  Nonsteroidal anti-inflammatory drugs (NSAIDs), such as naproxen.  Narcotic or central-acting agents, such as tramadol.  Corticosteroids. These can be given orally or as an injection.  Surgery to reposition the bones and relieve pain (osteotomy) or to remove loose pieces of bone and cartilage. Joint replacement may be needed in advanced states of osteoarthritis. HOME CARE INSTRUCTIONS   Only take over-the-counter or prescription medicines as directed by your health care provider. Take all medicines exactly as instructed.  Maintain a healthy weight. Follow your health care provider's instructions for weight control. This may include dietary instructions.  Exercise as directed. Your health care provider can recommend specific types of exercise. These may include:  Strengthening exercises These are done to strengthen the muscles that support joints affected by arthritis. They can be performed with weights or with exercise bands to add resistance.  Aerobic activities These are exercises, such as brisk walking or low-impact aerobics, that get your heart pumping.  Range-of-motion activities These keep your joints limber.  Balance and agility exercises These help you maintain daily living skills.  Rest your affected joints as directed by your health care provider.  Follow up with your health care provider as directed. SEEK MEDICAL CARE IF:   Your skin turns red.  You develop a rash in addition to your joint pain.  You have worsening joint pain. SEEK IMMEDIATE MEDICAL CARE IF:  You have a significant loss of weight or appetite.  You have a fever along with joint or muscle aches.  You have   night sweats. FOR MORE INFORMATION  National Institute of Arthritis and Musculoskeletal and Skin Diseases: www.niams.nih.gov National Institute on Aging: www.nia.nih.gov American College of Rheumatology: www.rheumatology.org Document Released: 05/19/2005  Document Revised: 03/09/2013 Document Reviewed: 01/24/2013 [employer] Patient Information 2014 [employer].  

## 2013-09-08 NOTE — Progress Notes (Signed)
Subjective:    Patient ID: Leah [name], female    DOB: 11/15/1929, 80 y.o.   MRN: [id number]  Arthritis Presents for follow-up visit. The disease course has been stable. She complains of pain. She reports no stiffness, joint swelling or joint warmth. Affected locations include the right shoulder, left shoulder, left knee and right knee. Her pain is at a severity of 2/10. Pertinent negatives include no diarrhea, dry eyes, dry mouth, dysuria, fatigue, fever, pain at night, pain while resting, rash, Raynaud's syndrome, uveitis or weight loss. Her past medical history is significant for osteoarthritis. Past treatments include acetaminophen and NSAIDs. The treatment provided significant relief. Factors aggravating her arthritis include activity. Compliance with prior treatments has been good.      Review of Systems  Constitutional: Negative.  Negative for fever, chills, weight loss, diaphoresis, appetite change and fatigue.  HENT: Negative.  Negative for congestion, facial swelling, nosebleeds, postnasal drip, rhinorrhea, sinus pressure, sneezing, sore throat and voice change.   Eyes: Negative.   Respiratory: Negative.  Negative for cough, chest tightness, shortness of breath, wheezing and stridor.   Cardiovascular: Negative.  Negative for chest pain, palpitations and leg swelling.  Gastrointestinal: Negative.  Negative for nausea, vomiting, abdominal pain and diarrhea.  Endocrine: Negative.   Genitourinary: Negative.  Negative for dysuria.  Musculoskeletal: Positive for arthralgias and arthritis. Negative for back pain, gait problem, joint swelling, myalgias, neck pain, neck stiffness and stiffness.  Skin: Negative.  Negative for rash.  Allergic/Immunologic: Negative.   Neurological: Negative.   Hematological: Negative.  Negative for adenopathy. Does not bruise/bleed easily.  Psychiatric/Behavioral: Negative.        Objective:   Physical Exam  Vitals reviewed. Constitutional: She  is oriented to person, place, and time. She appears well-developed and well-nourished. No distress.  HENT:  Head: Normocephalic and atraumatic.  Right Ear: Hearing, tympanic membrane, external ear and ear canal normal.  Left Ear: Hearing, tympanic membrane, external ear and ear canal normal.  Nose: No mucosal edema or rhinorrhea. Right sinus exhibits no maxillary sinus tenderness and no frontal sinus tenderness. Left sinus exhibits no maxillary sinus tenderness and no frontal sinus tenderness.  Mouth/Throat: Oropharynx is clear and moist and mucous membranes are normal. Mucous membranes are not pale, not dry and not cyanotic. No oral lesions. No trismus in the jaw. No uvula swelling. No oropharyngeal exudate, posterior oropharyngeal edema, posterior oropharyngeal erythema or tonsillar abscesses.  Eyes: Conjunctivae are normal. Right eye exhibits no discharge. Left eye exhibits no discharge. No scleral icterus.  Neck: Normal range of motion. Neck supple. No JVD present. No tracheal deviation present. No thyromegaly present.  Cardiovascular: Normal rate, regular rhythm, normal heart sounds and intact distal pulses.  Exam reveals no gallop and no friction rub.   No murmur heard. Pulmonary/Chest: Effort normal and breath sounds normal. No stridor. No respiratory distress. She has no wheezes. She has no rales. She exhibits no tenderness.  Abdominal: Soft. Bowel sounds are normal. She exhibits no distension and no mass. There is no tenderness. There is no rebound and no guarding.  Musculoskeletal: Normal range of motion. She exhibits no edema and no tenderness.  Lymphadenopathy:    She has no cervical adenopathy.  Neurological: She is oriented to person, place, and time.  Skin: Skin is warm and dry. No rash noted. She is not diaphoretic. No erythema. No pallor.     Lab Results  Component Value Date   WBC 5.5 11/10/2012   HGB 13.3 11/10/2012  HCT 39.5 11/10/2012   PLT 231.0 11/10/2012   GLUCOSE 84  11/10/2012   CHOL 216* 11/10/2012   TRIG 90.0 11/10/2012   HDL 97.90 11/10/2012   LDLDIRECT 97.0 11/10/2012   ALT 22 11/10/2012   AST 26 11/10/2012   NA 143 11/10/2012   K 4.2 11/10/2012   CL 108 11/10/2012   CREATININE 1.0 11/10/2012   BUN 21 11/10/2012   CO2 29 11/10/2012   TSH 2.02 11/10/2012   INR 0.91 10/06/2011       Assessment & Plan:

## 2013-09-08 NOTE — Progress Notes (Signed)
Pre visit review using our clinic review tool, if applicable. No additional management support is needed unless otherwise documented below in the visit note. 

## 2013-09-09 ENCOUNTER — Encounter: Payer: Self-pay | Admitting: Internal Medicine

## 2013-09-09 DIAGNOSIS — M1990 Unspecified osteoarthritis, unspecified site: Secondary | ICD-10-CM | POA: Insufficient documentation

## 2013-09-09 NOTE — Assessment & Plan Note (Signed)
Her symptoms are well controlled 

## 2013-09-09 NOTE — Assessment & Plan Note (Signed)
She is doing well on mobic

## 2013-10-13 ENCOUNTER — Other Ambulatory Visit: Payer: Self-pay

## 2013-10-13 ENCOUNTER — Other Ambulatory Visit: Payer: Self-pay | Admitting: Internal Medicine

## 2013-10-13 MED ORDER — MELOXICAM 15 MG PO TABS: ORAL_TABLET | ORAL | Status: DC

## 2014-01-23 ENCOUNTER — Encounter: Payer: Self-pay | Admitting: Gynecology

## 2014-01-26 ENCOUNTER — Ambulatory Visit (INDEPENDENT_AMBULATORY_CARE_PROVIDER_SITE_OTHER): Payer: Medicare PPO | Admitting: Gynecology

## 2014-01-26 ENCOUNTER — Encounter: Payer: Self-pay | Admitting: Gynecology

## 2014-01-26 VITALS — BP 124/82 | Ht 62.0 in | Wt 148.0 lb

## 2014-01-26 DIAGNOSIS — N952 Postmenopausal atrophic vaginitis: Secondary | ICD-10-CM

## 2014-01-26 DIAGNOSIS — M899 Disorder of bone, unspecified: Secondary | ICD-10-CM

## 2014-01-26 DIAGNOSIS — M949 Disorder of cartilage, unspecified: Secondary | ICD-10-CM

## 2014-01-26 DIAGNOSIS — M8580 Other specified disorders of bone density and structure, unspecified site: Secondary | ICD-10-CM

## 2014-01-26 DIAGNOSIS — N814 Uterovaginal prolapse, unspecified: Secondary | ICD-10-CM

## 2014-01-26 DIAGNOSIS — N8111 Cystocele, midline: Secondary | ICD-10-CM

## 2014-01-26 NOTE — Progress Notes (Signed)
Leah [name] [birth year] [id number]        80 y.o.  [id number] for followup exam. Several issues noted below.  Past medical history,surgical history, problem list, medications, allergies, family history and social history were all reviewed and documented as reviewed in the EPIC chart.  ROS:  12 system ROS performed with pertinent positives and negatives included in the history, assessment and plan.   Additional significant findings :  None   Exam: Kim assistant Filed Vitals:   01/26/14 0953  BP: 124/82  Height: 5\' 2"  (1.575 m)  Weight: 148 lb (67.132 kg)   General appearance:  Normal affect, orientation and appearance. Skin: Grossly normal HEENT: Without gross lesions.  No cervical or supraclavicular adenopathy. Thyroid normal.  Lungs:  Clear without wheezing, rales or rhonchi Cardiac: RR, without RMG Abdominal:  Soft, nontender, without masses, guarding, rebound, organomegaly or hernia Breasts:  Examined lying and sitting without masses, retractions, discharge or axillary adenopathy. Pelvic:  Ext/BUS/vagina with generalized atrophic changes. Second-degree cystocele. First to second degree uterine prolapse.  Cervix atrophic  Uterus anteverted, normal size, shape and contour, midline and mobile nontender   Adnexa  Without masses or tenderness    Anus and perineum  Normal   Rectovaginal  Normal sphincter tone without palpated masses or tenderness.    Assessment/Plan:  80 y.o. [id number] female for followup exam.   1. Postmenopausal/atrophic genital changes. Patient without significant symptoms of hot flushes, night sweats, vaginal dryness. Is not sexually active. No vaginal bleeding. Continue to monitor. Report any vaginal bleeding. 2. Cystocele/uterine prolapse. Patient without significant symptoms of pressure/pain or urinary complaints. Continue to monitor. Followup if issues develop. Check baseline urinalysis. 3. Osteopenia.  DEXA 12/2011 with T score -2.3. Had been on Fosamax and  Boniva between 2003-2011 and is on a drug-free holiday with a stable bone density. Schedule bone density now at 2 year interval and patient agrees to followup for this. Increase calcium vitamin D reviewed. 4. Pap smear 2011. No Pap smear done today. No history of significant abnormal Pap smears previously. Per current screening guidelines we both agreed to stop screening and she is comfortable with this. 5. Colonoscopy 2009. Repeat at their recommended interval. 6. Mammography 12/2013. Continue with annual mammography. SBE monthly review. 7. Health maintenance. No routine blood work done as this is done through her primary physician's office. Followup one year, sooner as needed.   Note: This document was prepared with digital dictation and possible smart phrase technology. Any transcriptional errors that result from this process are unintentional.   Anastasio Auerbach MD, 10:17 AM 01/26/2014

## 2014-01-26 NOTE — Patient Instructions (Addendum)
Follow up for bone density as scheduled.  You may obtain a copy of any labs that were done today by logging onto MyChart as outlined in the instructions provided with your AVS (after visit summary). The office will not call with normal lab results but certainly if there are any significant abnormalities then we will contact you.   Health Maintenance, Female A healthy lifestyle and preventative care can promote health and wellness.  Maintain regular health, dental, and eye exams.  Eat a healthy diet. Foods like vegetables, fruits, whole grains, low-fat dairy products, and lean protein foods contain the nutrients you need without too many calories. Decrease your intake of foods high in solid fats, added sugars, and salt. Get information about a proper diet from your caregiver, if necessary.  Regular physical exercise is one of the most important things you can do for your health. Most adults should get at least 150 minutes of moderate-intensity exercise (any activity that increases your heart rate and causes you to sweat) each week. In addition, most adults need muscle-strengthening exercises on 2 or more days a week.   Maintain a healthy weight. The body mass index (BMI) is a screening tool to identify possible weight problems. It provides an estimate of body fat based on height and weight. Your caregiver can help determine your BMI, and can help you achieve or maintain a healthy weight. For adults 20 years and older:  A BMI below 18.5 is considered underweight.  A BMI of 18.5 to 24.9 is normal.  A BMI of 25 to 29.9 is considered overweight.  A BMI of 30 and above is considered obese.  Maintain normal blood lipids and cholesterol by exercising and minimizing your intake of saturated fat. Eat a balanced diet with plenty of fruits and vegetables. Blood tests for lipids and cholesterol should begin at age 20 and be repeated every 5 years. If your lipid or cholesterol levels are high, you are over  50, or you are a high risk for heart disease, you may need your cholesterol levels checked more frequently.Ongoing high lipid and cholesterol levels should be treated with medicines if diet and exercise are not effective.  If you smoke, find out from your caregiver how to quit. If you do not use tobacco, do not start.  Lung cancer screening is recommended for adults aged 55 80 years who are at high risk for developing lung cancer because of a history of smoking. Yearly low-dose computed tomography (CT) is recommended for people who have at least a 30-pack-year history of smoking and are a current smoker or have quit within the past 15 years. A pack year of smoking is smoking an average of 1 pack of cigarettes a day for 1 year (for example: 1 pack a day for 30 years or 2 packs a day for 15 years). Yearly screening should continue until the smoker has stopped smoking for at least 15 years. Yearly screening should also be stopped for people who develop a health problem that would prevent them from having lung cancer treatment.  If you are pregnant, do not drink alcohol. If you are breastfeeding, be very cautious about drinking alcohol. If you are not pregnant and choose to drink alcohol, do not exceed 1 drink per day. One drink is considered to be 12 ounces (355 mL) of beer, 5 ounces (148 mL) of wine, or 1.5 ounces (44 mL) of liquor.  Avoid use of street drugs. Do not share needles with anyone. Ask for help   if you need support or instructions about stopping the use of drugs.  High blood pressure causes heart disease and increases the risk of stroke. Blood pressure should be checked at least every 1 to 2 years. Ongoing high blood pressure should be treated with medicines, if weight loss and exercise are not effective.  If you are 55 to 79 years old, ask your caregiver if you should take aspirin to prevent strokes.  Diabetes screening involves taking a blood sample to check your fasting blood sugar level.  This should be done once every 3 years, after age 45, if you are within normal weight and without risk factors for diabetes. Testing should be considered at a younger age or be carried out more frequently if you are overweight and have at least 1 risk factor for diabetes.  Breast cancer screening is essential preventative care for women. You should practice "breast self-awareness." This means understanding the normal appearance and feel of your breasts and may include breast self-examination. Any changes detected, no matter how small, should be reported to a caregiver. Women in their 20s and 30s should have a clinical breast exam (CBE) by a caregiver as part of a regular health exam every 1 to 3 years. After age 40, women should have a CBE every year. Starting at age 40, women should consider having a mammogram (breast X-ray) every year. Women who have a family history of breast cancer should talk to their caregiver about genetic screening. Women at a high risk of breast cancer should talk to their caregiver about having an MRI and a mammogram every year.  Breast cancer gene (BRCA)-related cancer risk assessment is recommended for women who have family members with BRCA-related cancers. BRCA-related cancers include breast, ovarian, tubal, and peritoneal cancers. Having family members with these cancers may be associated with an increased risk for harmful changes (mutations) in the breast cancer genes BRCA1 and BRCA2. Results of the assessment will determine the need for genetic counseling and BRCA1 and BRCA2 testing.  The Pap test is a screening test for cervical cancer. Women should have a Pap test starting at age 21. Between ages 21 and 29, Pap tests should be repeated every 2 years. Beginning at age 30, you should have a Pap test every 3 years as long as the past 3 Pap tests have been normal. If you had a hysterectomy for a problem that was not cancer or a condition that could lead to cancer, then you no  longer need Pap tests. If you are between ages 65 and 70, and you have had normal Pap tests going back 10 years, you no longer need Pap tests. If you have had past treatment for cervical cancer or a condition that could lead to cancer, you need Pap tests and screening for cancer for at least 20 years after your treatment. If Pap tests have been discontinued, risk factors (such as a new sexual partner) need to be reassessed to determine if screening should be resumed. Some women have medical problems that increase the chance of getting cervical cancer. In these cases, your caregiver may recommend more frequent screening and Pap tests.  The human papillomavirus (HPV) test is an additional test that may be used for cervical cancer screening. The HPV test looks for the virus that can cause the cell changes on the cervix. The cells collected during the Pap test can be tested for HPV. The HPV test could be used to screen women aged 30 years and older, and   be used in women of any age who have unclear Pap test results. After the age of 63, women should have HPV testing at the same frequency as a Pap test.  Colorectal cancer can be detected and often prevented. Most routine colorectal cancer screening begins at the age of 67 and continues through age 18. However, your caregiver Emel recommend screening at an earlier age if you have risk factors for colon cancer. On a yearly basis, your caregiver Vila provide home test kits to check for hidden blood in the stool. Use of a small camera at the end of a tube, to directly examine the colon (sigmoidoscopy or colonoscopy), can detect the earliest forms of colorectal cancer. Talk to your caregiver about this at age 65, when routine screening begins. Direct examination of the colon should be repeated every 5 to 10 years through age 70, unless early forms of pre-cancerous polyps or small growths are found.  Hepatitis C blood testing is recommended for all people born from  [birth year] through 1963 and any individual with known risks for hepatitis C.  Practice safe sex. Use condoms and avoid high-risk sexual practices to reduce the spread of sexually transmitted infections (STIs). Sexually active women aged 26 and younger should be checked for Chlamydia, which is a common sexually transmitted infection. Older women with new or multiple partners should also be tested for Chlamydia. Testing for other STIs is recommended if you are sexually active and at increased risk.  Osteoporosis is a disease in which the bones lose minerals and strength with aging. This can result in serious bone fractures. The risk of osteoporosis can be identified using a bone density scan. Women ages 9 and over and women at risk for fractures or osteoporosis should discuss screening with their caregivers. Ask your caregiver whether you should be taking a calcium supplement or vitamin D to reduce the rate of osteoporosis.  Menopause can be associated with physical symptoms and risks. Hormone replacement therapy is available to decrease symptoms and risks. You should talk to your caregiver about whether hormone replacement therapy is right for you.  Use sunscreen. Apply sunscreen liberally and repeatedly throughout the day. You should seek shade when your shadow is shorter than you. Protect yourself by wearing long sleeves, pants, a wide-brimmed hat, and sunglasses year round, whenever you are outdoors.  Notify your caregiver of new moles or changes in moles, especially if there is a change in shape or color. Also notify your caregiver if a mole is larger than the size of a pencil eraser.  Stay current with your immunizations. Document Released: 12/02/2010 Document Revised: 09/13/2012 Document Reviewed: 12/02/2010 [facility] Patient Information 2014 [employer].

## 2014-01-27 LAB — URINALYSIS W MICROSCOPIC + REFLEX CULTURE
Bacteria, UA: NONE SEEN
Bilirubin Urine: NEGATIVE
CASTS: NONE SEEN
Glucose, UA: NEGATIVE mg/dL
Hgb urine dipstick: NEGATIVE
NITRITE: NEGATIVE
Protein, ur: NEGATIVE mg/dL
SPECIFIC GRAVITY, URINE: 1.026 (ref 1.005–1.030)
Urobilinogen, UA: 0.2 mg/dL (ref 0.0–1.0)
pH: 6.5 (ref 5.0–8.0)

## 2014-01-28 LAB — URINE CULTURE: Colony Count: 8000

## 2014-01-31 DIAGNOSIS — M810 Age-related osteoporosis without current pathological fracture: Secondary | ICD-10-CM

## 2014-01-31 HISTORY — DX: Age-related osteoporosis without current pathological fracture: M81.0

## 2014-02-16 ENCOUNTER — Ambulatory Visit (INDEPENDENT_AMBULATORY_CARE_PROVIDER_SITE_OTHER): Payer: Medicare PPO

## 2014-02-16 ENCOUNTER — Other Ambulatory Visit: Payer: Self-pay | Admitting: Gynecology

## 2014-02-16 DIAGNOSIS — N952 Postmenopausal atrophic vaginitis: Secondary | ICD-10-CM

## 2014-02-16 DIAGNOSIS — M810 Age-related osteoporosis without current pathological fracture: Secondary | ICD-10-CM

## 2014-02-20 ENCOUNTER — Encounter: Payer: Self-pay | Admitting: Gynecology

## 2014-04-03 ENCOUNTER — Encounter: Payer: Self-pay | Admitting: Gynecology

## 2014-05-12 ENCOUNTER — Encounter: Payer: Self-pay | Admitting: Family

## 2014-05-12 ENCOUNTER — Ambulatory Visit (INDEPENDENT_AMBULATORY_CARE_PROVIDER_SITE_OTHER): Payer: Medicare PPO | Admitting: Family

## 2014-05-12 VITALS — BP 130/84 | HR 95 | Temp 98.5°F | Ht 62.0 in | Wt 149.0 lb

## 2014-05-12 DIAGNOSIS — J069 Acute upper respiratory infection, unspecified: Secondary | ICD-10-CM

## 2014-05-12 MED ORDER — AZITHROMYCIN 250 MG PO TABS: ORAL_TABLET | ORAL | Status: DC

## 2014-05-12 NOTE — Progress Notes (Signed)
   Subjective:    Patient ID: Leah [name], female    DOB: 1929/11/11, 80 y.o.   MRN: [id number]  Chief Complaint  Patient presents with  . Cough    HPI:  Leah [name] is a 80 y.o. female who presents today for an acute visit.  Acute symptoms started the first part of the week with sneezing, sore throat, congestion and a cough have been going for about 5 days now. She has been taking the generic Zyrtec-D which has not helped. Denies any fevers or sinus pressure at present. Denies any thing that makes it better or worse.  Allergies  Allergen Reactions  . Sulfa Antibiotics     RASH  . Nabumetone Other (See Comments)    Feel weird    . Naproxen Sodium Hives  . Tramadol Hcl     REACTION: caused nausea and vomiting   Current Outpatient Prescriptions on File Prior to Visit  Medication Sig Dispense Refill  . acetaminophen (TYLENOL) 500 MG tablet Take 1,000 mg by mouth every 6 (six) hours as needed.    . Azelastine-Fluticasone (DYMISTA) 137-50 MCG/ACT SUSP Place 1 Act into the nose 2 (two) times daily. 1 Bottle 11  . latanoprost (XALATAN) 0.005 % ophthalmic solution Place 1 drop into both eyes at bedtime.     . meloxicam (MOBIC) 15 MG tablet TAKE 1 TABLET EACH DAY. 90 tablet 3  . omeprazole (PRILOSEC) 20 MG capsule Take 20 mg by mouth daily as needed.      No current facility-administered medications on file prior to visit.   Review of Systems    See HPI  Objective:    BP 130/84 mmHg  Pulse 95  Temp(Src) 98.5 F (36.9 C) (Oral)  Ht 5\' 2"  (1.575 m)  Wt 149 lb (67.586 kg)  BMI 27.25 kg/m2  SpO2 97% Nursing note and vital signs reviewed.  Physical Exam  Constitutional: She is oriented to person, place, and time. She appears well-developed and well-nourished. No distress.  HENT:  Right Ear: Hearing, tympanic membrane, external ear and ear canal normal.  Left Ear: Hearing, tympanic membrane, external ear and ear canal normal.  Nose: Nose normal. Right sinus  exhibits no maxillary sinus tenderness and no frontal sinus tenderness. Left sinus exhibits no maxillary sinus tenderness and no frontal sinus tenderness.  Mouth/Throat: Uvula is midline, oropharynx is clear and moist and mucous membranes are normal.  Cardiovascular: Normal rate, regular rhythm, normal heart sounds and intact distal pulses.   Pulmonary/Chest: Effort normal and breath sounds normal.  Neurological: She is alert and oriented to person, place, and time.  Skin: Skin is warm and dry.  Psychiatric: She has a normal mood and affect. Her behavior is normal. Judgment and thought content normal.       Assessment & Plan:

## 2014-05-12 NOTE — Patient Instructions (Signed)
Upper Respiratory Infection, Adult An upper respiratory infection (URI) is also sometimes known as the common cold. The upper respiratory tract includes the nose, sinuses, throat, trachea, and bronchi. Bronchi are the airways leading to the lungs. Most people improve within 1 week, but symptoms can last up to 2 weeks. A residual cough may last even longer.  CAUSES Many different viruses can infect the tissues lining the upper respiratory tract. The tissues become irritated and inflamed and often become very moist. Mucus production is also common. A cold is contagious. You can easily spread the virus to others by oral contact. This includes kissing, sharing a glass, coughing, or sneezing. Touching your mouth or nose and then touching a surface, which is then touched by another person, can also spread the virus. SYMPTOMS  Symptoms typically develop 1 to 3 days after you come in contact with a cold virus. Symptoms vary from person to person. They may include:  Runny nose.  Sneezing.  Nasal congestion.  Sinus irritation.  Sore throat.  Loss of voice (laryngitis).  Cough.  Fatigue.  Muscle aches.  Loss of appetite.  Headache.  Low-grade fever. DIAGNOSIS  You might diagnose your own cold based on familiar symptoms, since most people get a cold 2 to 3 times a year. Your caregiver can confirm this based on your exam. Most importantly, your caregiver can check that your symptoms are not due to another disease such as strep throat, sinusitis, pneumonia, asthma, or epiglottitis. Blood tests, throat tests, and X-rays are not necessary to diagnose a common cold, but they may sometimes be helpful in excluding other more serious diseases. Your caregiver will decide if any further tests are required. RISKS AND COMPLICATIONS  You may be at risk for a more severe case of the common cold if you smoke cigarettes, have chronic heart disease (such as heart failure) or lung disease (such as asthma), or if  you have a weakened immune system. The very young and very old are also at risk for more serious infections. Bacterial sinusitis, middle ear infections, and bacterial pneumonia can complicate the common cold. The common cold can worsen asthma and chronic obstructive pulmonary disease (COPD). Sometimes, these complications can require emergency medical care and may be life-threatening. PREVENTION  The best way to protect against getting a cold is to practice good hygiene. Avoid oral or hand contact with people with cold symptoms. Wash your hands often if contact occurs. There is no clear evidence that vitamin C, vitamin E, echinacea, or exercise reduces the chance of developing a cold. However, it is always recommended to get plenty of rest and practice good nutrition. TREATMENT  Treatment is directed at relieving symptoms. There is no cure. Antibiotics are not effective, because the infection is caused by a virus, not by bacteria. Treatment may include:  Increased fluid intake. Sports drinks offer valuable electrolytes, sugars, and fluids.  Breathing heated mist or steam (vaporizer or shower).  Eating chicken soup or other clear broths, and maintaining good nutrition.  Getting plenty of rest.  Using gargles or lozenges for comfort.  Controlling fevers with ibuprofen or acetaminophen as directed by your caregiver.  Increasing usage of your inhaler if you have asthma. Zinc gel and zinc lozenges, taken in the first 24 hours of the common cold, can shorten the duration and lessen the severity of symptoms. Pain medicines may help with fever, muscle aches, and throat pain. A variety of non-prescription medicines are available to treat congestion and runny nose. Your caregiver   can make recommendations and may suggest nasal or lung inhalers for other symptoms.  HOME CARE INSTRUCTIONS   Only take over-the-counter or prescription medicines for pain, discomfort, or fever as directed by your  caregiver.  Use a warm mist humidifier or inhale steam from a shower to increase air moisture. This may keep secretions moist and make it easier to breathe.  Drink enough water and fluids to keep your urine clear or pale yellow.  Rest as needed.  Return to work when your temperature has returned to normal or as your caregiver advises. You may need to stay home longer to avoid infecting others. You can also use a face mask and careful hand washing to prevent spread of the virus. SEEK MEDICAL CARE IF:   After the first few days, you feel you are getting worse rather than better.  You need your caregiver's advice about medicines to control symptoms.  You develop chills, worsening shortness of breath, or brown or red sputum. These may be signs of pneumonia.  You develop yellow or brown nasal discharge or pain in the face, especially when you bend forward. These may be signs of sinusitis.  You develop a fever, swollen neck glands, pain with swallowing, or white areas in the back of your throat. These may be signs of strep throat. SEEK IMMEDIATE MEDICAL CARE IF:   You have a fever.  You develop severe or persistent headache, ear pain, sinus pain, or chest pain.  You develop wheezing, a prolonged cough, cough up blood, or have a change in your usual mucus (if you have chronic lung disease).  You develop sore muscles or a stiff neck. Document Released: 11/12/2000 Document Revised: 08/11/2011 Document Reviewed: 08/24/2013 [employer] Patient Information 2015 [employer]. This information is not intended to replace advice given to you by your health care provider. Make sure you discuss any questions you have with your health care provider.  

## 2014-05-12 NOTE — Assessment & Plan Note (Signed)
Symptoms and exam consistent with acute upper respiratory infection. If symptoms worsen in next 1-2 days start azithromycin. Continue over-the-counter medications as needed for symptom relief. Follow up if symptoms worsen or fail to improve.

## 2014-09-27 ENCOUNTER — Other Ambulatory Visit: Payer: Self-pay | Admitting: Internal Medicine

## 2014-10-04 ENCOUNTER — Encounter: Payer: Self-pay | Admitting: Internal Medicine

## 2014-10-04 ENCOUNTER — Ambulatory Visit (INDEPENDENT_AMBULATORY_CARE_PROVIDER_SITE_OTHER): Payer: Medicare PPO | Admitting: Internal Medicine

## 2014-10-04 VITALS — BP 122/70 | HR 84 | Temp 98.3°F | Resp 16 | Ht 62.0 in | Wt 144.0 lb

## 2014-10-04 DIAGNOSIS — J0100 Acute maxillary sinusitis, unspecified: Secondary | ICD-10-CM

## 2014-10-04 DIAGNOSIS — R5383 Other fatigue: Secondary | ICD-10-CM | POA: Diagnosis not present

## 2014-10-04 DIAGNOSIS — I447 Left bundle-branch block, unspecified: Secondary | ICD-10-CM

## 2014-10-04 MED ORDER — AMOXICILLIN-POT CLAVULANATE 875-125 MG PO TABS: 1.0000 | ORAL_TABLET | Freq: Two times a day (BID) | ORAL | Status: DC

## 2014-10-04 NOTE — Progress Notes (Signed)
Subjective:    Patient ID: Leah [name], female    DOB: [birth year], 81 y.o.   MRN: [id number]  Sinusitis This is a recurrent problem. The current episode started 1 to 4 weeks ago. The problem has been gradually worsening since onset. The maximum temperature recorded prior to her arrival was 100.4 - 100.9 F. The fever has been present for 3 to 4 days. Her pain is at a severity of 0/10. She is experiencing no pain. Associated symptoms include chills, sinus pressure and a sore throat. Pertinent negatives include no congestion, coughing, diaphoresis, ear pain, headaches, hoarse voice, neck pain, shortness of breath, sneezing or swollen glands. Past treatments include oral decongestants, spray decongestants and acetaminophen. The treatment provided mild relief.      Review of Systems  Constitutional: Positive for chills and fatigue. Negative for fever, diaphoresis, activity change, appetite change and unexpected weight change.  HENT: Positive for sinus pressure and sore throat. Negative for congestion, ear pain, hoarse voice, sneezing and trouble swallowing.   Eyes: Negative.   Respiratory: Negative.  Negative for cough, choking, chest tightness, shortness of breath, wheezing and stridor.   Cardiovascular: Negative.  Negative for chest pain, palpitations and leg swelling.  Gastrointestinal: Negative.  Negative for abdominal pain.  Endocrine: Negative.   Genitourinary: Negative.   Musculoskeletal: Negative.  Negative for back pain, arthralgias and neck pain.  Skin: Negative.  Negative for rash.  Allergic/Immunologic: Negative.   Neurological: Negative.  Negative for headaches.  Hematological: Negative.  Negative for adenopathy. Does not bruise/bleed easily.  Psychiatric/Behavioral: Negative.        Objective:   Physical Exam  Constitutional: She is oriented to person, place, and time. She appears well-developed and well-nourished.  Non-toxic appearance. She does not have a sickly  appearance. She does not appear ill. No distress.  HENT:  Head: Normocephalic and atraumatic.  Right Ear: Hearing, tympanic membrane, external ear and ear canal normal.  Left Ear: Hearing, tympanic membrane, external ear and ear canal normal.  Nose: No mucosal edema, rhinorrhea or nasal septal hematoma. Right sinus exhibits maxillary sinus tenderness. Right sinus exhibits no frontal sinus tenderness. Left sinus exhibits maxillary sinus tenderness. Left sinus exhibits no frontal sinus tenderness.  Mouth/Throat: Oropharynx is clear and moist and mucous membranes are normal. Mucous membranes are not pale, not dry and not cyanotic. No oral lesions. No trismus in the jaw. No uvula swelling. No oropharyngeal exudate or posterior oropharyngeal erythema.  Eyes: Conjunctivae are normal. Right eye exhibits no discharge. Left eye exhibits no discharge. No scleral icterus.  Neck: Normal range of motion. Neck supple. No JVD present. No tracheal deviation present. No thyromegaly present.  Cardiovascular: Normal rate, regular rhythm, S1 normal, S2 normal and intact distal pulses.  Exam reveals no gallop and no friction rub.   Murmur heard.  Systolic murmur is present with a grade of 1/6   No diastolic murmur is present  Pulmonary/Chest: Effort normal and breath sounds normal. No stridor. No respiratory distress. She has no wheezes. She has no rales. She exhibits no tenderness.  Abdominal: Soft. Bowel sounds are normal. She exhibits no distension and no mass. There is no tenderness. There is no rebound and no guarding.  Musculoskeletal: Normal range of motion. She exhibits no edema or tenderness.  Lymphadenopathy:    She has no cervical adenopathy.  Neurological: She is oriented to person, place, and time.  Skin: Skin is warm and dry. No rash noted. She is not diaphoretic. No erythema. No pallor.  Vitals reviewed.   Lab Results  Component Value Date   WBC 5.5 11/10/2012   HGB 13.3 11/10/2012   HCT 39.5  11/10/2012   PLT 231.0 11/10/2012   GLUCOSE 84 11/10/2012   CHOL 216* 11/10/2012   TRIG 90.0 11/10/2012   HDL 97.90 11/10/2012   LDLDIRECT 97.0 11/10/2012   ALT 22 11/10/2012   AST 26 11/10/2012   NA 143 11/10/2012   K 4.2 11/10/2012   CL 108 11/10/2012   CREATININE 1.0 11/10/2012   BUN 21 11/10/2012   CO2 29 11/10/2012   TSH 2.02 11/10/2012   INR 0.91 10/06/2011        Assessment & Plan:

## 2014-10-04 NOTE — Assessment & Plan Note (Addendum)
EKG shows LBBB - this was seen on an EKG done 3 years ago but was never investigated by cardiology, there are no new or acute findings today Her only suspicious symptom is fatigue but she has no CP.SOB. DOE I have asked her to see cardiology for further evaluation

## 2014-10-04 NOTE — Assessment & Plan Note (Signed)
This started at the same time her URI started Will check her labs for secondary causes

## 2014-10-04 NOTE — Patient Instructions (Signed)

## 2014-10-04 NOTE — Assessment & Plan Note (Signed)
Will treat the infection with augmentin 

## 2014-10-04 NOTE — Progress Notes (Signed)
Pre visit review using our clinic review tool, if applicable. No additional management support is needed unless otherwise documented below in the visit note. 

## 2014-11-20 ENCOUNTER — Encounter: Payer: Self-pay | Admitting: Cardiovascular Disease

## 2014-11-20 ENCOUNTER — Ambulatory Visit (INDEPENDENT_AMBULATORY_CARE_PROVIDER_SITE_OTHER): Payer: Medicare PPO | Admitting: Cardiovascular Disease

## 2014-11-20 VITALS — BP 124/86 | HR 68 | Ht 63.0 in | Wt 148.0 lb

## 2014-11-20 DIAGNOSIS — I447 Left bundle-branch block, unspecified: Secondary | ICD-10-CM

## 2014-11-20 DIAGNOSIS — R011 Cardiac murmur, unspecified: Secondary | ICD-10-CM | POA: Diagnosis not present

## 2014-11-20 NOTE — Progress Notes (Signed)
Patient ID: Leah [name], female   DOB: Apr 07, 1930, 81 y.o.   MRN: [id number]     Cardiology Office Note   Date:  11/20/2014   ID:  KENNI NEWTON, DOB 29-Nov-1929, MRN [id number]  PCP:  Scarlette Calico, MD  Cardiologist:   Sanda Klein, MD   Chief Complaint  Patient presents with  . New Evaluation    LBBB. patinet reports some shortness of breath with exercise, otherwise, no other complaints.      History of Present Illness: Leah [name] is a 81 y.o. female who presents for consultation regarding a newly identified murmur and chronic left bundle branch block.  She has been remarkably healthy without any chronic cardiac or other medical conditions except for degenerative osteoarthritis and glaucoma. She has had a variety of surgical procedures including bilateral hip replacement and right total knee replacement at [facility]. She has not had problems with exertional dyspnea or angina, palpitations, lower extremity edema, syncope, intermittent claudication. All of her complaints center around joint issues.  She has had a left bundle branch block at least since 2008. There has never been evidence of high-grade A-V conduction abnormalities and as mentioned she has never had syncope. She denies fatigue.  She has a systolic murmur on auscultation. She has never had an echocardiogram.  Past Medical History  Diagnosis Date  . GERD (gastroesophageal reflux disease)   . Acute maxillary sinusitis   . Unspecified adverse effect of unspecified drug, medicinal and biological substance   . Basal cell carcinoma of nose   . Localized osteoarthrosis not specified whether primary or secondary, lower leg   . Hyperlipidemia   . Osteoporosis 01/2014    T score -2.5 UD forarm, stable at the spine recommend to your follow up DEXA  . Uterine prolapse   . Cystocele   . Detrusor instability   . Glaucoma   . PONV (postoperative nausea and vomiting)     19 yrs ago- states has had  general anesthesia since and did well  . Carpal tunnel syndrome   . Bundle branch block, unspecified     EKG 4/13 with clearance and note that isnt new block Dr Arnoldo Morale on chart    Past Surgical History  Procedure Laterality Date  . Arthroscopic r knee    . Flexible sigmoidoscopy    . Total hip rerplacement bilateral    . Dilation and curettage of uterus  1982  . Carpal tunnel release  2011  . Hysteroscopy w/d&c  2005 and 2008  . Eye surgery      bilateral cataract extraction  with IOL  . Joint replacement      bilateral total hip arthroplasty  . Total knee arthroplasty  10/13/2011    Procedure: TOTAL KNEE ARTHROPLASTY;  Surgeon: Gearlean Alf, MD;  Location: [facility];  Service: Orthopedics;  Laterality: Right;  . Hysteroscopy       Current Outpatient Prescriptions  Medication Sig Dispense Refill  . acetaminophen (TYLENOL) 500 MG tablet Take 1,000 mg by mouth every 6 (six) hours as needed.    Marland Kitchen amoxicillin-clavulanate (AUGMENTIN) 875-125 MG per tablet Take 1 tablet by mouth 2 (two) times daily. 20 tablet 0  . Ascorbic Acid (VITAMIN C) 1000 MG tablet Take 1,000 mg by mouth daily.    . Azelastine-Fluticasone (DYMISTA) 137-50 MCG/ACT SUSP Place 1 Act into the nose 2 (two) times daily. 1 Bottle 11  . cholecalciferol (VITAMIN D) 1000 UNITS tablet Take 1,000 Units by mouth daily.    Marland Kitchen  latanoprost (XALATAN) 0.005 % ophthalmic solution Place 1 drop into both eyes at bedtime.     . meloxicam (MOBIC) 15 MG tablet TAKE 1 TABLET EACH DAY. 90 tablet 1  . Multiple Minerals (CALCIUM-MAGNESIUM-ZINC) TABS Take 3 tablets by mouth daily.    . Multiple Vitamin (DAILY-VITAMIN PO) Women's Ultra Mega Joint supplements - Women's Ultra Mega w/o Iron and Iodine, 2 tablets. Triflex - 3 tablets. Triple Strength Fish Oil - 1 capsule.    . Multiple Vitamins-Minerals (MULTIPLE VITAMINS/WOMENS) tablet Take 2 tablets by mouth daily.    . Omega-3 Fatty Acids (FISH OIL) 1000 MG CAPS Take 2 capsules by mouth 2 (two)  times daily.    Marland Kitchen omeprazole (PRILOSEC) 20 MG capsule Take 20 mg by mouth daily as needed.     . timolol (TIMOPTIC-XR) 0.5 % ophthalmic gel-forming     . vitamin E 400 UNIT capsule Take 400 Units by mouth daily.     No current facility-administered medications for this visit.    Allergies:   Sulfa antibiotics; Nabumetone; Naproxen sodium; and Tramadol hcl    Social History:  The patient  reports that she quit smoking about 56 years ago. She has never used smokeless tobacco. She reports that she drinks about 0.6 oz of alcohol per week. She reports that she does not use illicit drugs.   Family History:  The patient's family history includes Breast cancer (age of onset: 19) in her mother; Dementia in her mother and sister; Heart disease in her father; Stroke in her father.    ROS:  Please see the history of present illness.    Otherwise, review of systems positive for none.   All other systems are reviewed and negative.    PHYSICAL EXAM: VS:  BP 124/86 mmHg  Pulse 68  Ht 5\' 3"  (1.6 m)  Wt 148 lb (67.132 kg)  BMI 26.22 kg/m2 , BMI Body mass index is 26.22 kg/(m^2).  General: Alert, oriented x3, no distress Head: no evidence of trauma, PERRL, EOMI, no exophtalmos or lid lag, no myxedema, no xanthelasma; normal ears, nose and oropharynx Neck: normal jugular venous pulsations and no hepatojugular reflux; brisk carotid pulses without delay and no carotid bruits Chest: clear to auscultation, no signs of consolidation by percussion or palpation, normal fremitus, symmetrical and full respiratory excursions Cardiovascular: normal position and quality of the apical impulse, regular rhythm, normal first and second heart sounds, no diastolic murmurs, rubs or gallops. Early peaking aortic ejection murmur grade 1-2/6 Abdomen: no tenderness or distention, no masses by palpation, no abnormal pulsatility or arterial bruits, normal bowel sounds, no hepatosplenomegaly Extremities: no clubbing, cyanosis  or edema; 2+ radial, ulnar and brachial pulses bilaterally; 2+ right femoral, posterior tibial and dorsalis pedis pulses; 2+ left femoral, posterior tibial and dorsalis pedis pulses; no subclavian or femoral bruits Neurological: grossly nonfocal Psych: euthymic mood, full affect   EKG:  EKG is ordered today. The ekg ordered today demonstrates normal sinus rhythm, left bundle branch block, unchanged from 2013 and 2008    Lipid Panel    Component Value Date/Time   CHOL 216* 11/10/2012 0924   TRIG 90.0 11/10/2012 0924   HDL 97.90 11/10/2012 0924   CHOLHDL 2 11/10/2012 0924   VLDL 18.0 11/10/2012 0924   LDLDIRECT 97.0 11/10/2012 0924      Wt Readings from Last 3 Encounters:  11/20/14 148 lb (67.132 kg)  10/04/14 144 lb (65.318 kg)  05/12/14 149 lb (67.586 kg)    ASSESSMENT AND PLAN:  1.  Systolic murmur, most consistent with aortic valve sclerosis. Will check an echocardiogram. Doubt we will find significant structural heart disease. 2. Chronic left bundle branch block without history of syncope or high-grade AV block. Suspect that this is an isolated electrical conduction abnormality and that we will not find significant structural heart disease on echo. May be a sign that she might need a pacemaker at some time in the future but there are no symptoms to justify such a consideration at this time.  If echo findings are favorable, will come back to see Cardiology on as-needed basis only. We will call her with the echo results.   Current medicines are reviewed at length with the patient today.  The patient does not have concerns regarding medicines.  The following changes have been made:  no change  Labs/ tests ordered today include:  Orders Placed This Encounter  Procedures  . ECHOCARDIOGRAM COMPLETE    Patient Instructions  Your physician has requested that you have an echocardiogram. Echocardiography is a painless test that uses sound waves to create images of your heart.  It provides your doctor with information about the size and shape of your heart and how well your heart's chambers and valves are working. This procedure takes approximately one hour. There are no restrictions for this procedure.  Dr. Sallyanne Kuster recommends that you schedule a follow-up appointment in: AS NEEDED.       Mikael Spray, MD  11/20/2014 1:07 PM    Sanda Klein, MD, [facility] [phone] office [phone] pager

## 2014-11-20 NOTE — Patient Instructions (Signed)
Your physician has requested that you have an echocardiogram. Echocardiography is a painless test that uses sound waves to create images of your heart. It provides your doctor with information about the size and shape of your heart and how well your heart's chambers and valves are working. This procedure takes approximately one hour. There are no restrictions for this procedure.  Dr. Sallyanne Kuster recommends that you schedule a follow-up appointment in: AS NEEDED.

## 2014-11-23 ENCOUNTER — Ambulatory Visit (HOSPITAL_COMMUNITY)
Admission: RE | Admit: 2014-11-23 | Discharge: 2014-11-23 | Disposition: A | Discharge status: Home / Self Care | Payer: Medicare PPO | Source: Ambulatory Visit | Attending: Cardiology | Admitting: Cardiology

## 2014-11-23 DIAGNOSIS — R011 Cardiac murmur, unspecified: Secondary | ICD-10-CM | POA: Diagnosis not present

## 2014-12-01 ENCOUNTER — Encounter: Payer: Self-pay | Admitting: Podiatry

## 2014-12-01 ENCOUNTER — Ambulatory Visit (INDEPENDENT_AMBULATORY_CARE_PROVIDER_SITE_OTHER): Payer: Medicare PPO | Admitting: Podiatry

## 2014-12-01 VITALS — BP 147/79 | HR 77 | Resp 15

## 2014-12-01 DIAGNOSIS — M79673 Pain in unspecified foot: Secondary | ICD-10-CM | POA: Diagnosis not present

## 2014-12-01 DIAGNOSIS — B351 Tinea unguium: Secondary | ICD-10-CM

## 2014-12-01 NOTE — Progress Notes (Signed)
   Subjective:    Patient ID: Leah [name], female    DOB: August 02, 1929, 81 y.o.   MRN: [id number]  HPI Patient presents with bilateral nails on the great toe being loose. Pt stated that when she takes a shower, her right great toe nail is loose. Pt said there is no pain and it does not hurt. Pt said she know her right great toe is loose and feels her left great toe is loose as well. This has been going on for the past 6 months.   Review of Systems  Skin: Positive for color change.  All other systems reviewed and are negative.      Objective:   Physical Exam        Assessment & Plan:

## 2014-12-03 NOTE — Progress Notes (Signed)
Subjective:     Patient ID: Leah [name], female   DOB: 1929/09/24, 81 y.o.   MRN: [id number]  HPI patient states that both my big toenails are thick and lose coming off and I wanted to know what my alternatives are   Review of Systems  All other systems reviewed and are negative.      Objective:   Physical Exam  Constitutional: She is oriented to person, place, and time.  Cardiovascular: Intact distal pulses.   Pulmonary/Chest: Breath sounds normal.  Neurological: She is oriented to person, place, and time.  Skin: Skin is warm.  Nursing note and vitals reviewed.  neurovascular status found to be intact muscle strength was adequate range of motion within normal limits with patient having damaged hallux nailbeds bilateral with the distal two thirds the nails being thick and lose with obvious long-term issues with the beds themselves. Patient's found to be well oriented and has good digital perfusion     Assessment:     Damaged hallux nailbeds bilateral with thickness and looseness and debris    Plan:     Mycotic nail infections with pain hallux bilateral with long-term trauma and damage. Reviewed conditions for this and discussed permanent removal of nails which may be necessary at one point but at this point I went ahead and debrided the distal two thirds of the beds and explained continuing this until decided that she wants to have them removed permanently

## 2015-01-10 ENCOUNTER — Other Ambulatory Visit: Payer: Self-pay | Admitting: Internal Medicine

## 2015-01-26 ENCOUNTER — Encounter: Payer: Self-pay | Admitting: Gynecology

## 2015-01-29 ENCOUNTER — Ambulatory Visit (INDEPENDENT_AMBULATORY_CARE_PROVIDER_SITE_OTHER): Payer: Medicare PPO | Admitting: Gynecology

## 2015-01-29 ENCOUNTER — Encounter: Payer: Self-pay | Admitting: Gynecology

## 2015-01-29 VITALS — BP 120/70 | Ht 62.0 in | Wt 150.0 lb

## 2015-01-29 DIAGNOSIS — Z01419 Encounter for gynecological examination (general) (routine) without abnormal findings: Secondary | ICD-10-CM

## 2015-01-29 DIAGNOSIS — M810 Age-related osteoporosis without current pathological fracture: Secondary | ICD-10-CM

## 2015-01-29 DIAGNOSIS — N952 Postmenopausal atrophic vaginitis: Secondary | ICD-10-CM | POA: Diagnosis not present

## 2015-01-29 DIAGNOSIS — N814 Uterovaginal prolapse, unspecified: Secondary | ICD-10-CM | POA: Diagnosis not present

## 2015-01-29 DIAGNOSIS — N8111 Cystocele, midline: Secondary | ICD-10-CM | POA: Diagnosis not present

## 2015-01-29 NOTE — Progress Notes (Signed)
Leah [name] 01-23-1930 [id number]        81 y.o.  G2P2002 for Breast and pelvic exam. Several issues noted below.  Past medical history,surgical history, problem list, medications, allergies, family history and social history were all reviewed and documented as reviewed in the EPIC chart.  ROS:  Performed with pertinent positives and negatives included in the history, assessment and plan.   Additional significant findings :  none   Exam: Kim Counsellor Vitals:   01/29/15 1113  BP: 120/70  Height: 5\' 2"  (1.575 m)  Weight: 150 lb (68.04 kg)   General appearance:  Normal affect, orientation and appearance. Skin: Grossly normal HEENT: Without gross lesions.  No cervical or supraclavicular adenopathy. Thyroid normal.  Lungs:  Clear without wheezing, rales or rhonchi Cardiac: RR, without RMG Abdominal:  Soft, nontender, without masses, guarding, rebound, organomegaly or hernia Breasts:  Examined lying and sitting without masses, retractions, discharge or axillary adenopathy. Pelvic:  Ext/BUS/vagina with atrophic changes. Second-degree cystocele. First to second-degree uterine prolapse.  Cervix atrophic  Uterus anteverted, normal size, shape and contour, midline and mobile nontender   Adnexa  Without masses or tenderness    Anus and perineum  Normal   Rectovaginal  Normal sphincter tone without palpated masses or tenderness.    Assessment/Plan:  81 y.o. [name] female for breast and pelvic exam.   1. Postmenopausal/atrophic genital changes. Without significant hot flushes, night sweats, vaginal dryness. No vaginal bleeding. Continue to monitor report any vaginal bleeding or issues. 2. Cystocele/uterine prolapse. Stable over serial exams. Is asymptomatic to the patient. Continue to monitor.  Check urinalysis. 3. Osteoporosis.  T score -2.5 at forearm. Spine stable.  History of Fosamax and Boniva between 2003 and 2011. Currently on drug-free holiday. Follow up DEXA in 2 years.  Increased calcium and vitamin D. 4. Pap smear  2011. No Pap smear done today. No history of significantly abnormal Pap smears. We both agreed to stop screening per current screening guidelines based on age. 5. Mammography this month. Continue with annual mammography. SBE monthly reviewed. 6. Colonoscopy 2009. Repeat at their recommended interval. 7. Health maintenance. No routine blood work done as this is done at her primary physician's office. Follow up in one year, sooner as needed.   Anastasio Auerbach MD, 11:43 AM 01/29/2015

## 2015-01-29 NOTE — Patient Instructions (Signed)

## 2015-01-30 LAB — URINALYSIS W MICROSCOPIC + REFLEX CULTURE
BACTERIA UA: NONE SEEN [HPF]
Bilirubin Urine: NEGATIVE
CASTS: NONE SEEN [LPF]
CRYSTALS: NONE SEEN [HPF]
Glucose, UA: NEGATIVE
HGB URINE DIPSTICK: NEGATIVE
KETONES UR: NEGATIVE
Nitrite: NEGATIVE
PROTEIN: NEGATIVE
Specific Gravity, Urine: 1.025 (ref 1.001–1.035)
Yeast: NONE SEEN [HPF]
pH: 6 (ref 5.0–8.0)

## 2015-01-31 LAB — URINE CULTURE
COLONY COUNT: NO GROWTH
Organism ID, Bacteria: NO GROWTH

## 2015-04-24 ENCOUNTER — Other Ambulatory Visit: Payer: Self-pay | Admitting: Internal Medicine

## 2015-07-04 ENCOUNTER — Ambulatory Visit (INDEPENDENT_AMBULATORY_CARE_PROVIDER_SITE_OTHER): Payer: Medicare Other | Admitting: Emergency Medicine

## 2015-07-04 ENCOUNTER — Ambulatory Visit (INDEPENDENT_AMBULATORY_CARE_PROVIDER_SITE_OTHER): Payer: Medicare Other

## 2015-07-04 VITALS — BP 110/70 | HR 105 | Temp 99.9°F | Resp 18 | Ht 62.0 in | Wt 150.4 lb

## 2015-07-04 DIAGNOSIS — R05 Cough: Secondary | ICD-10-CM

## 2015-07-04 DIAGNOSIS — R509 Fever, unspecified: Secondary | ICD-10-CM

## 2015-07-04 DIAGNOSIS — R059 Cough, unspecified: Secondary | ICD-10-CM

## 2015-07-04 DIAGNOSIS — J209 Acute bronchitis, unspecified: Secondary | ICD-10-CM

## 2015-07-04 LAB — POCT INFLUENZA A/B
INFLUENZA A, POC: NEGATIVE
Influenza B, POC: NEGATIVE

## 2015-07-04 LAB — POCT CBC
Granulocyte percent: 78.9 %G (ref 37–80)
HCT, POC: 39.1 % (ref 37.7–47.9)
HEMOGLOBIN: 13.4 g/dL (ref 12.2–16.2)
LYMPH, POC: 0.6 (ref 0.6–3.4)
MCH: 29.9 pg (ref 27–31.2)
MCHC: 34.2 g/dL (ref 31.8–35.4)
MCV: 87.4 fL (ref 80–97)
MID (cbc): 0.3 (ref 0–0.9)
MPV: 5.7 fL (ref 0–99.8)
PLATELET COUNT, POC: 203 10*3/uL (ref 142–424)
POC Granulocyte: 3.4 (ref 2–6.9)
POC LYMPH PERCENT: 14.7 %L (ref 10–50)
POC MID %: 6.4 % (ref 0–12)
RBC: 4.48 M/uL (ref 4.04–5.48)
RDW, POC: 15.3 %
WBC: 4.3 10*3/uL — AB (ref 4.6–10.2)

## 2015-07-04 MED ORDER — DOXYCYCLINE HYCLATE 100 MG PO TABS: 100.0000 mg | ORAL_TABLET | Freq: Two times a day (BID) | ORAL | Status: DC

## 2015-07-04 MED ORDER — BENZONATATE 100 MG PO CAPS: 100.0000 mg | ORAL_CAPSULE | Freq: Three times a day (TID) | ORAL | Status: DC | PRN

## 2015-07-04 NOTE — Patient Instructions (Addendum)
Because you received an x-ray today, you will receive an invoice from [facility]. Please contact [facility] at [phone] with questions or concerns regarding your invoice. Our billing staff will not be able to assist you with those questions. Acute Bronchitis Bronchitis is inflammation of the airways that extend from the windpipe into the lungs (bronchi). The inflammation often causes mucus to develop. This leads to a cough, which is the most common symptom of bronchitis.  In acute bronchitis, the condition usually develops suddenly and goes away over time, usually in a couple weeks. Smoking, allergies, and asthma can make bronchitis worse. Repeated episodes of bronchitis may cause further lung problems.  CAUSES Acute bronchitis is most often caused by the same virus that causes a cold. The virus can spread from person to person (contagious) through coughing, sneezing, and touching contaminated objects. SIGNS AND SYMPTOMS   Cough.   Fever.   Coughing up mucus.   Body aches.   Chest congestion.   Chills.   Shortness of breath.   Sore throat.  DIAGNOSIS  Acute bronchitis is usually diagnosed through a physical exam. Your health care provider will also ask you questions about your medical history. Tests, such as chest X-rays, are sometimes done to rule out other conditions.  TREATMENT  Acute bronchitis usually goes away in a couple weeks. Oftentimes, no medical treatment is necessary. Medicines are sometimes given for relief of fever or cough. Antibiotic medicines are usually not needed but may be prescribed in certain situations. In some cases, an inhaler may be recommended to help reduce shortness of breath and control the cough. A cool mist vaporizer may also be used to help thin bronchial secretions and make it easier to clear the chest.  HOME CARE INSTRUCTIONS  Get plenty of rest.   Drink enough fluids to keep your urine clear or pale yellow (unless  you have a medical condition that requires fluid restriction). Increasing fluids may help thin your respiratory secretions (sputum) and reduce chest congestion, and it will prevent dehydration.   Take medicines only as directed by your health care provider.  If you were prescribed an antibiotic medicine, finish it all even if you start to feel better.  Avoid smoking and secondhand smoke. Exposure to cigarette smoke or irritating chemicals will make bronchitis worse. If you are a smoker, consider using nicotine gum or skin patches to help control withdrawal symptoms. Quitting smoking will help your lungs heal faster.   Reduce the chances of another bout of acute bronchitis by washing your hands frequently, avoiding people with cold symptoms, and trying not to touch your hands to your mouth, nose, or eyes.   Keep all follow-up visits as directed by your health care provider.  SEEK MEDICAL CARE IF: Your symptoms do not improve after 1 week of treatment.  SEEK IMMEDIATE MEDICAL CARE IF:  You develop an increased fever or chills.   You have chest pain.   You have severe shortness of breath.  You have bloody sputum.   You develop dehydration.  You faint or repeatedly feel like you are going to pass out.  You develop repeated vomiting.  You develop a severe headache. MAKE SURE YOU:   Understand these instructions.  Will watch your condition.  Will get help right away if you are not doing well or get worse.   This information is not intended to replace advice given to you by your health care provider. Make sure you discuss any questions you have with your   health care provider.   Document Released: 06/26/2004 Document Revised: 06/09/2014 Document Reviewed: 11/09/2012 Elsevier Interactive Patient Education 2016 [employer]  

## 2015-07-04 NOTE — Progress Notes (Addendum)
Patient ID: Leah [name], female   DOB: 07/10/1929, 82 y.o.   MRN: [id number]    By signing my name below, I, Leah [name], attest that this documentation has been prepared under the direction and in the presence of Leah Russian, MD Electronically Signed: Ladene [name], ED Scribe 07/04/2015 at 9:31 AM.  Chief Complaint:  Chief Complaint  Patient presents with  . Cough    x 3 days  . Fever   HPI: Leah [name] is a 82 y.o. female who reports to Leah [name] today complaining of peristent dry cough onset 3 days ago. Pt reports associated mild fever. Triage temperature 99.9 F. Pt recently went on a cruise to the [place] for 1 week and suspects she picked up an illness while vacationing. She has tried [employer] Plus Cold and Cough with temporary relief. Pt is a nonsmoker. She has received a flu vaccine this year.   Glaucoma Pt currently uses Xalatan eye drops x2 daily for Glaucoma.  Pt lives at home with her husband who is a Radiographer, therapeutic.   Past Medical History  Diagnosis Date  . GERD (gastroesophageal reflux disease)   . Acute maxillary sinusitis   . Unspecified adverse effect of unspecified drug, medicinal and biological substance   . Basal cell carcinoma of nose   . Localized osteoarthrosis not specified whether primary or secondary, lower leg   . Hyperlipidemia   . Uterine prolapse   . Cystocele   . Detrusor instability   . Glaucoma   . PONV (postoperative nausea and vomiting)     19 yrs ago- states has had general anesthesia since and did well  . Carpal tunnel syndrome   . Bundle branch block, unspecified     EKG 4/13 with clearance and note that isnt new block Dr Leah [name] on chart  . Osteoporosis 01/2014    T score -2.5 UD forarm, stable at the spine recommend 2 year follow up DEXA   Past Surgical History  Procedure Laterality Date  . Arthroscopic r knee    . Flexible sigmoidoscopy    . Total hip rerplacement bilateral    . Dilation and curettage of uterus  1982  .  Carpal tunnel release  2011  . Hysteroscopy w/d&c  2005 and 2008  . Eye surgery      bilateral cataract extraction  with IOL  . Joint replacement      bilateral total hip arthroplasty  . Total knee arthroplasty  10/13/2011    Procedure: TOTAL KNEE ARTHROPLASTY;  Surgeon: Leah Alf, MD;  Location: [facility];  Service: Orthopedics;  Laterality: Right;  . Hysteroscopy     Social History   Social History  . Marital Status: Married    Spouse Name: N/A  . Number of Children: N/A  . Years of Education: N/A   Social History Main Topics  . Smoking status: Former Smoker    Quit date: 10/06/1958  . Smokeless tobacco: Never Used  . Alcohol Use: 0.6 oz/week    1 Glasses of wine per week     Comment: 1 glass wine week  . Drug Use: No  . Sexual Activity: No     Comment: 1st intercourse 74 yo-1 partner   Other Topics Concern  . None   Social History Narrative   Family History  Problem Relation Age of Onset  . Dementia Mother   . Breast cancer Mother 33  . Heart disease Father   . Stroke Father   . Dementia Sister  Allergies  Allergen Reactions  . Sulfa Antibiotics     RASH  . Nabumetone Other (See Comments)    Feel weird    . Naproxen Sodium Hives  . Tramadol Hcl     REACTION: caused nausea and vomiting   Prior to Admission medications   Medication Sig Start Date End Date Taking? Authorizing Provider  acetaminophen (TYLENOL) 500 MG tablet Take 1,000 mg by mouth every 6 (six) hours as needed.   Yes Historical Provider, MD  Ascorbic Acid (VITAMIN C) 1000 MG tablet Take 1,000 mg by mouth daily.   Yes Historical Provider, MD  cholecalciferol (VITAMIN D) 1000 UNITS tablet Take 1,000 Units by mouth daily.   Yes Historical Provider, MD  DYMISTA 137-50 MCG/ACT SUSP USE 1 SPRAY IN EACH NOSTRIL TWICE A DAY. 01/10/15  Yes Leah Lima, MD  latanoprost (XALATAN) 0.005 % ophthalmic solution Place 1 drop into both eyes at bedtime.    Yes Historical Provider, MD  meloxicam (MOBIC)  15 MG tablet TAKE 1 TABLET EACH DAY. 04/24/15  Yes Leah Lima, MD  Multiple Vitamins-Minerals (MULTIPLE VITAMINS/WOMENS) tablet Take 2 tablets by mouth daily.   Yes Historical Provider, MD  Omega-3 Fatty Acids (FISH OIL) 1000 MG CAPS Take 2 capsules by mouth 2 (two) times daily.   Yes Historical Provider, MD  omeprazole (PRILOSEC) 20 MG capsule Take 20 mg by mouth daily as needed.    Yes Historical Provider, MD  timolol (TIMOPTIC-XR) 0.5 % ophthalmic gel-forming  09/27/14  Yes Historical Provider, MD  vitamin E 400 UNIT capsule Take 400 Units by mouth daily.   Yes Historical Provider, MD   ROS: The patient denies chills, night sweats, unintentional weight loss, chest pain, palpitations, wheezing, dyspnea on exertion, nausea, vomiting, abdominal pain, dysuria, hematuria, melena, numbness, weakness, or tingling. +fever, +cough  All other systems have been reviewed and were otherwise negative with the exception of those mentioned in the HPI and as above.    PHYSICAL EXAM: Filed Vitals:   07/04/15 0901  BP: 110/70  Pulse: 105  Temp: 99.9 F (37.7 C)  Resp: 18   Body mass index is 27.5 kg/(m^2).  General: Alert, no acute distress HEENT:  Normocephalic, atraumatic, oropharynx patent. Eye: Leah [name] [facility] Cardiovascular:  Regular rate and rhythm, no rubs murmurs or gallops. No Carotid bruits, radial pulse intact. No pedal edema.  Respiratory: Flew dry rales, posterior, lateral, R and L. No wheezes or rhonchi. No cyanosis, no use of accessory musculature Abdominal: No organomegaly, abdomen is soft and non-tender, positive bowel sounds. No masses. Musculoskeletal: Gait intact. No edema, tenderness Skin: No rashes. Neurologic: Facial musculature symmetric. Psychiatric: Patient acts appropriately throughout our interaction. Lymphatic: No cervical or submandibular lymphadenopathy  LABS: Results for orders placed or performed in visit on 07/04/15  POCT CBC  Result Value Ref Range   WBC 4.3  (A) 4.6 - 10.2 K/uL   Lymph, poc 0.6 0.6 - 3.4   POC LYMPH PERCENT 14.7 10 - 50 %L   MID (cbc) 0.3 0 - 0.9   POC MID % 6.4 0 - 12 %M   POC Granulocyte 3.4 2 - 6.9   Granulocyte percent 78.9 37 - 80 %G   RBC 4.48 4.04 - 5.48 M/uL   Hemoglobin 13.4 12.2 - 16.2 g/dL   HCT, POC 39.1 37.7 - 47.9 %   MCV 87.4 80 - 97 fL   MCH, POC 29.9 27 - 31.2 pg   MCHC 34.2 31.8 - 35.4 g/dL   RDW,  POC 15.3 %   Platelet Count, POC 203 142 - 424 K/uL   MPV 5.7 0 - 99.8 fL  POCT Influenza A/B  Result Value Ref Range   Influenza A, POC Negative Negative   Influenza B, POC Negative Negative   EKG/XRAY:   Primary read interpreted by Dr. Everlene Farrier at [facility]. Dg Chest 2 View  07/04/2015  CLINICAL DATA:  Shortness of breath. EXAM: CHEST  2 VIEW COMPARISON:  No prior. FINDINGS: Mediastinum and hilar structures normal. Lungs are clear. Cardiomegaly. No pulmonary venous congestion. No pleural effusion or pneumothorax. Degenerative changes thoracic spine. IMPRESSION: Mild cardiomegaly. No evidence congestive heart failure. No focal pulmonary infiltrate. Electronically Signed   By: Marcello Moores  Register   On: 07/04/2015 09:47   ASSESSMENT/PLAN:     Johney Maine sideeffects, risk and benefits, and alternatives of medications d/w patient. Patient is aware that all medications have potential sideeffects and we are unable to predict every sideeffect or drug-drug interaction that may occur.  Arlyss Queen MD 07/04/2015 9:21 AM

## 2015-08-13 ENCOUNTER — Telehealth: Payer: Self-pay | Admitting: Internal Medicine

## 2015-08-13 NOTE — Telephone Encounter (Signed)
Patient Name: Leah [name]  DOB: 10/25/1929    Initial Comment Caller states c/o infection wound of leg   Nurse Assessment  Nurse: Raphael Gibney, RN, Vera Date/Time (Eastern Time): 08/13/2015 1:54:36 PM  Confirm and document reason for call. If symptomatic, describe symptoms. You must click the next button to save text entered. ---Caller states she has infected wound on her right leg. Has had wound for a week. She was kneeling on her sofa and went to get up and the cloth tore the skin on her leg. Area is red and tender. Has swelling on her right leg down to her ankle.  Has the patient traveled out of the country within the last 30 days? ---No  Does the patient have any new or worsening symptoms? ---Yes  Will a triage be completed? ---Yes  Related visit to physician within the last 2 weeks? ---No  Does the PT have any chronic conditions? (i.e. diabetes, asthma, etc.) ---No  Is this a behavioral health or substance abuse call? ---No     Guidelines    Guideline Title Affirmed Question Affirmed Notes  Wound Infection [1] Red area or streak AND [2] no fever    Final Disposition User   See Physician within 24 Hours [place], RN, Vera    Comments  Appt scheduled for 10 am 08/14/15 at 10 am with Dr. Scarlette Calico   Referrals  REFERRED TO PCP OFFICE   Disagree/Comply: Comply

## 2015-08-14 ENCOUNTER — Encounter: Payer: Self-pay | Admitting: Internal Medicine

## 2015-08-14 ENCOUNTER — Ambulatory Visit: Payer: Self-pay | Admitting: Internal Medicine

## 2015-08-14 ENCOUNTER — Ambulatory Visit (INDEPENDENT_AMBULATORY_CARE_PROVIDER_SITE_OTHER): Payer: Medicare Other | Admitting: Internal Medicine

## 2015-08-14 VITALS — BP 126/80 | HR 64 | Temp 97.9°F | Resp 16 | Ht 62.0 in | Wt 149.0 lb

## 2015-08-14 DIAGNOSIS — L03119 Cellulitis of unspecified part of limb: Secondary | ICD-10-CM

## 2015-08-14 DIAGNOSIS — L02419 Cutaneous abscess of limb, unspecified: Secondary | ICD-10-CM

## 2015-08-14 MED ORDER — CEFUROXIME AXETIL 500 MG PO TABS: 500.0000 mg | ORAL_TABLET | Freq: Two times a day (BID) | ORAL | Status: DC

## 2015-08-14 NOTE — Patient Instructions (Signed)
Wound Infection °A wound infection happens when a type of germ (bacteria) starts growing in the wound. In some cases, this can cause the wound to break open. If cared for properly, the infected wound will heal from the inside to the outside. Wound infections need treatment. °CAUSES °An infection is caused by bacteria growing in the wound.  °SYMPTOMS  °· Increase in redness, swelling, or pain at the wound site. °· Increase in drainage at the wound site. °· Wound or bandage (dressing) starts to smell bad. °· Fever. °· Feeling tired or fatigued. °· Pus draining from the wound. °TREATMENT  °Your health care provider will prescribe antibiotic medicine. The wound infection should improve within 24 to 48 hours. Any redness around the wound should stop spreading and the wound should be less painful.  °HOME CARE INSTRUCTIONS  °· Only take over-the-counter or prescription medicines for pain, discomfort, or fever as directed by your health care provider. °· Take your antibiotics as directed. Finish them even if you start to feel better. °· Gently wash the area with mild soap and water 2 times a day, or as directed. Rinse off the soap. Pat the area dry with a clean towel. Do not rub the wound. This may cause bleeding. °· Follow your health care provider's instructions for how often you need to change the dressing. °· Apply ointment and a dressing to the wound as directed. °· If the dressing sticks, moisten it with soapy water and gently remove it. °· Change the bandage right away if it becomes wet, dirty, or develops a bad smell. °· Take showers. Do not take tub baths, swim, or do anything that may soak the wound until it is healed. °· Avoid exercises that make you sweat heavily. °· Use anti-itch medicine as directed by your health care provider. The wound may itch when it is healing. Do not pick or scratch at the wound. °· Follow up with your health care provider to get your wound rechecked as directed. °SEEK MEDICAL CARE  IF: °· You have an increase in swelling, pain, or redness around the wound. °· You have an increase in the amount of pus coming from the wound. °· There is a bad smell coming from the wound. °· More of the wound breaks open. °· You have a fever. °MAKE SURE YOU:  °· Understand these instructions. °· Will watch your condition. °· Will get help right away if you are not doing well or get worse. °  °This information is not intended to replace advice given to you by your health care provider. Make sure you discuss any questions you have with your health care provider. °  °Document Released: 02/15/2003 Document Revised: 05/24/2013 Document Reviewed: 11/06/2014 °Elsevier Interactive Patient Education ©2016 [employer] ° °

## 2015-08-14 NOTE — Progress Notes (Signed)
Pre visit review using our clinic review tool, if applicable. No additional management support is needed unless otherwise documented below in the visit note. 

## 2015-08-14 NOTE — Progress Notes (Signed)
Subjective:  Patient ID: Leah [name], female    DOB: 1929/09/19  Age: 82 y.o. MRN: [id number]  CC: Wound Check   HPI Leah [name] presents for recheck of RLE wound (skin tear) that occurred one week ago. She is concerned about persistent pain and swelling at the site over her right anterior shin. She had chills yesterday but denies fever. She states there has been no drainage or discharge from the wound. She is cleaning it twice a day but has not applied any antibiotic ointment.  Outpatient Prescriptions Prior to Visit  Medication Sig Dispense Refill  . acetaminophen (TYLENOL) 500 MG tablet Take 1,000 mg by mouth every 6 (six) hours as needed.    . Ascorbic Acid (VITAMIN C) 1000 MG tablet Take 1,000 mg by mouth daily.    . cholecalciferol (VITAMIN D) 1000 UNITS tablet Take 1,000 Units by mouth daily.    Marland Kitchen DYMISTA 137-50 MCG/ACT SUSP USE 1 SPRAY IN EACH NOSTRIL TWICE A DAY. 23 g 11  . latanoprost (XALATAN) 0.005 % ophthalmic solution Place 1 drop into both eyes at bedtime.     . meloxicam (MOBIC) 15 MG tablet TAKE 1 TABLET EACH DAY. 90 tablet 3  . Multiple Vitamins-Minerals (MULTIPLE VITAMINS/WOMENS) tablet Take 2 tablets by mouth daily.    . Omega-3 Fatty Acids (FISH OIL) 1000 MG CAPS Take 2 capsules by mouth 2 (two) times daily.    Marland Kitchen omeprazole (PRILOSEC) 20 MG capsule Take 20 mg by mouth daily as needed.     . timolol (TIMOPTIC-XR) 0.5 % ophthalmic gel-forming     . vitamin E 400 UNIT capsule Take 400 Units by mouth daily.    . benzonatate (TESSALON) 100 MG capsule Take 1-2 capsules (100-200 mg total) by mouth 3 (three) times daily as needed for cough. 40 capsule 0  . doxycycline (VIBRA-TABS) 100 MG tablet Take 1 tablet (100 mg total) by mouth 2 (two) times daily. 20 tablet 0   No facility-administered medications prior to visit.    ROS Review of Systems  Constitutional: Positive for chills. Negative for fever and fatigue.  HENT: Negative.   Eyes: Negative.     Respiratory: Negative.   Cardiovascular: Negative.  Negative for chest pain, palpitations and leg swelling.  Gastrointestinal: Negative.  Negative for abdominal pain and diarrhea.  Endocrine: Negative.   Genitourinary: Negative.   Musculoskeletal: Negative.  Negative for myalgias, joint swelling and arthralgias.  Skin: Positive for color change and wound. Negative for pallor and rash.  Allergic/Immunologic: Negative.   Neurological: Negative.  Negative for weakness.  Hematological: Negative.  Negative for adenopathy. Does not bruise/bleed easily.  Psychiatric/Behavioral: Negative.     Objective:  BP 126/80 mmHg  Pulse 64  Temp(Src) 97.9 F (36.6 C) (Oral)  Resp 16  Ht 5\' 2"  (1.575 m)  Wt 149 lb (67.586 kg)  BMI 27.25 kg/m2  SpO2 99%  BP Readings from Last 3 Encounters:  08/14/15 126/80  07/04/15 110/70  01/29/15 120/70    Wt Readings from Last 3 Encounters:  08/14/15 149 lb (67.586 kg)  07/04/15 150 lb 6.4 oz (68.221 kg)  01/29/15 150 lb (68.04 kg)    Physical Exam  Constitutional: She is oriented to person, place, and time. No distress.  HENT:  Mouth/Throat: Oropharynx is clear and moist. No oropharyngeal exudate.  Eyes: Conjunctivae are normal. Right eye exhibits no discharge. Left eye exhibits no discharge. No scleral icterus.  Neck: Normal range of motion. Neck supple. No JVD present. No tracheal  deviation present. No thyromegaly present.  Cardiovascular: Normal rate, regular rhythm, normal heart sounds and intact distal pulses.  Exam reveals no gallop and no friction rub.   No murmur heard. Pulmonary/Chest: Effort normal and breath sounds normal. No stridor. No respiratory distress. She has no wheezes. She has no rales. She exhibits no tenderness.  Abdominal: Soft. Bowel sounds are normal. She exhibits no distension and no mass. There is no tenderness. There is no rebound and no guarding.  Musculoskeletal: Normal range of motion. She exhibits no edema or  tenderness.       Right lower leg: She exhibits deformity and laceration. She exhibits no tenderness, no bony tenderness, no swelling and no edema.       Legs: Lymphadenopathy:    She has no cervical adenopathy.  Neurological: She is oriented to person, place, and time.  Skin: Skin is warm and dry. No rash noted. She is not diaphoretic. There is erythema. No pallor.  Vitals reviewed.   Lab Results  Component Value Date   WBC 4.3* 07/04/2015   HGB 13.4 07/04/2015   HCT 39.1 07/04/2015   PLT 231.0 11/10/2012   GLUCOSE 84 11/10/2012   CHOL 216* 11/10/2012   TRIG 90.0 11/10/2012   HDL 97.90 11/10/2012   LDLDIRECT 97.0 11/10/2012   ALT 22 11/10/2012   AST 26 11/10/2012   NA 143 11/10/2012   K 4.2 11/10/2012   CL 108 11/10/2012   CREATININE 1.0 11/10/2012   BUN 21 11/10/2012   CO2 29 11/10/2012   TSH 2.02 11/10/2012   INR 0.91 10/06/2011    No results found.  Assessment & Plan:   Leah [name] was seen today for wound check.  Diagnoses and all orders for this visit:  Cellulitis and abscess of leg, except foot- Necrotic skin was removed, will treat empirically for strep infection with Ceftin, she will also start applying triple antibiotic ointment -     cefUROXime (CEFTIN) 500 MG tablet; Take 1 tablet (500 mg total) by mouth 2 (two) times daily.   I have discontinued Leah [name] doxycycline and benzonatate. I am also having her start on cefUROXime. Additionally, I am having her maintain her omeprazole, latanoprost, acetaminophen, timolol, MULTIPLE VITAMINS/WOMENS, Fish Oil, cholecalciferol, vitamin C, vitamin E, DYMISTA, and meloxicam.  Meds ordered this encounter  Medications  . cefUROXime (CEFTIN) 500 MG tablet    Sig: Take 1 tablet (500 mg total) by mouth 2 (two) times daily.    Dispense:  20 tablet    Refill:  0     Follow-up: Return in about 3 weeks (around 09/04/2015).  Scarlette Calico, MD

## 2015-10-09 ENCOUNTER — Telehealth: Payer: Self-pay | Admitting: Internal Medicine

## 2015-10-09 DIAGNOSIS — J324 Chronic pansinusitis: Secondary | ICD-10-CM

## 2015-10-09 NOTE — Telephone Encounter (Signed)
Patient would like to know if Dr. Ronnald Ramp would refer her.

## 2015-10-09 NOTE — Telephone Encounter (Signed)
There is no referral in chart and last OV was regarding a wound. A message can be routed to PCP an additonal visit may or may not be needed she has an upcoming CPE she can also wait until then for referral since PCP will be out of office until [facility]

## 2015-10-09 NOTE — Telephone Encounter (Signed)
Patient is requesting a call back in regards to a referral to an ENT for sinus issues.

## 2015-10-14 DIAGNOSIS — J329 Chronic sinusitis, unspecified: Secondary | ICD-10-CM | POA: Insufficient documentation

## 2015-10-14 NOTE — Telephone Encounter (Signed)
ENT referral ordered.

## 2015-10-15 NOTE — Telephone Encounter (Signed)
Pt informed

## 2015-10-30 ENCOUNTER — Ambulatory Visit (INDEPENDENT_AMBULATORY_CARE_PROVIDER_SITE_OTHER): Payer: Medicare Other | Admitting: Internal Medicine

## 2015-10-30 ENCOUNTER — Other Ambulatory Visit (INDEPENDENT_AMBULATORY_CARE_PROVIDER_SITE_OTHER): Payer: Medicare Other

## 2015-10-30 ENCOUNTER — Encounter: Payer: Self-pay | Admitting: Internal Medicine

## 2015-10-30 VITALS — BP 144/80 | HR 79 | Temp 97.7°F | Resp 16 | Ht 62.0 in | Wt 150.0 lb

## 2015-10-30 DIAGNOSIS — E785 Hyperlipidemia, unspecified: Secondary | ICD-10-CM

## 2015-10-30 DIAGNOSIS — M810 Age-related osteoporosis without current pathological fracture: Secondary | ICD-10-CM

## 2015-10-30 DIAGNOSIS — J301 Allergic rhinitis due to pollen: Secondary | ICD-10-CM

## 2015-10-30 DIAGNOSIS — Z0000 Encounter for general adult medical examination without abnormal findings: Secondary | ICD-10-CM

## 2015-10-30 LAB — CBC WITH DIFFERENTIAL/PLATELET
BASOS PCT: 0.9 % (ref 0.0–3.0)
Basophils Absolute: 0.1 10*3/uL (ref 0.0–0.1)
EOS ABS: 0.3 10*3/uL (ref 0.0–0.7)
Eosinophils Relative: 5.2 % — ABNORMAL HIGH (ref 0.0–5.0)
HEMATOCRIT: 38.3 % (ref 36.0–46.0)
HEMOGLOBIN: 12.7 g/dL (ref 12.0–15.0)
LYMPHS PCT: 31.9 % (ref 12.0–46.0)
Lymphs Abs: 2.1 10*3/uL (ref 0.7–4.0)
MCHC: 33.2 g/dL (ref 30.0–36.0)
MCV: 87.6 fl (ref 78.0–100.0)
Monocytes Absolute: 0.5 10*3/uL (ref 0.1–1.0)
Monocytes Relative: 7.1 % (ref 3.0–12.0)
Neutro Abs: 3.6 10*3/uL (ref 1.4–7.7)
Neutrophils Relative %: 54.9 % (ref 43.0–77.0)
Platelets: 232 10*3/uL (ref 150.0–400.0)
RBC: 4.37 Mil/uL (ref 3.87–5.11)
RDW: 15.2 % (ref 11.5–15.5)
WBC: 6.6 10*3/uL (ref 4.0–10.5)

## 2015-10-30 LAB — COMPREHENSIVE METABOLIC PANEL
ALBUMIN: 4.1 g/dL (ref 3.5–5.2)
ALK PHOS: 60 U/L (ref 39–117)
ALT: 16 U/L (ref 0–35)
AST: 20 U/L (ref 0–37)
BUN: 24 mg/dL — ABNORMAL HIGH (ref 6–23)
CALCIUM: 9.5 mg/dL (ref 8.4–10.5)
CHLORIDE: 106 meq/L (ref 96–112)
CO2: 29 mEq/L (ref 19–32)
CREATININE: 0.89 mg/dL (ref 0.40–1.20)
GFR: 64.31 mL/min (ref >60.00)
Glucose, Bld: 89 mg/dL (ref 70–99)
POTASSIUM: 4.2 meq/L (ref 3.5–5.1)
Sodium: 142 mEq/L (ref 135–145)
TOTAL PROTEIN: 6.7 g/dL (ref 6.0–8.3)
Total Bilirubin: 0.6 mg/dL (ref 0.2–1.2)

## 2015-10-30 LAB — TSH: TSH: 1.5 u[IU]/mL (ref 0.35–4.50)

## 2015-10-30 LAB — LIPID PANEL
CHOLESTEROL: 222 mg/dL — AB (ref 0–200)
HDL: 80.9 mg/dL (ref >39.00)
LDL CALC: 111 mg/dL — AB (ref 0–99)
NonHDL: 141.58
TRIGLYCERIDES: 154 mg/dL — AB (ref 0.0–149.0)
Total CHOL/HDL Ratio: 3
VLDL: 30.8 mg/dL (ref 0.0–40.0)

## 2015-10-30 NOTE — Progress Notes (Signed)
Subjective:  Patient ID: Leah [name], female    DOB: 09/22/1929  Age: 82 y.o. MRN: [id number]  CC: Annual Exam; Osteoarthritis; and Hyperlipidemia   HPI Leah [name] presents for a CPX.   She has DJD in both knees and the pain is well-controlled with meloxicam and Tylenol as needed. She otherwise feels well and offers no complaints today.      Past Medical History  Diagnosis Date  . GERD (gastroesophageal reflux disease)   . Acute maxillary sinusitis   . Unspecified adverse effect of unspecified drug, medicinal and biological substance   . Basal cell carcinoma of nose   . Localized osteoarthrosis not specified whether primary or secondary, lower leg   . Hyperlipidemia   . Uterine prolapse   . Cystocele   . Detrusor instability   . Glaucoma   . PONV (postoperative nausea and vomiting)     19 yrs ago- states has had general anesthesia since and did well  . Carpal tunnel syndrome   . Bundle branch block, unspecified     EKG 4/13 with clearance and note that isnt new block Dr Arnoldo Morale on chart  . Osteoporosis 01/2014    T score -2.5 UD forarm, stable at the spine recommend 2 year follow up DEXA   Past Surgical History  Procedure Laterality Date  . Arthroscopic r knee    . Flexible sigmoidoscopy    . Total hip rerplacement bilateral    . Dilation and curettage of uterus  1982  . Carpal tunnel release  2011  . Hysteroscopy w/d&c  2005 and 2008  . Eye surgery      bilateral cataract extraction  with IOL  . Joint replacement      bilateral total hip arthroplasty  . Total knee arthroplasty  10/13/2011    Procedure: TOTAL KNEE ARTHROPLASTY;  Surgeon: Gearlean Alf, MD;  Location: [facility];  Service: Orthopedics;  Laterality: Right;  . Hysteroscopy      reports that she quit smoking about 57 years ago. She has never used smokeless tobacco. She reports that she drinks about 0.6 oz of alcohol per week. She reports that she does not use illicit drugs. family history  includes Breast cancer (age of onset: 33) in her mother; Dementia in her mother and sister; Heart disease in her father; Stroke in her father. Allergies  Allergen Reactions  . Sulfa Antibiotics     RASH  . Nabumetone Other (See Comments)    Feel weird    . Naproxen Sodium Hives  . Tramadol Hcl     REACTION: caused nausea and vomiting    Outpatient Prescriptions Prior to Visit  Medication Sig Dispense Refill  . acetaminophen (TYLENOL) 500 MG tablet Take 1,000 mg by mouth every 6 (six) hours as needed.    . Ascorbic Acid (VITAMIN C) 1000 MG tablet Take 1,000 mg by mouth daily.    . cholecalciferol (VITAMIN D) 1000 UNITS tablet Take 1,000 Units by mouth daily.    Marland Kitchen DYMISTA 137-50 MCG/ACT SUSP USE 1 SPRAY IN EACH NOSTRIL TWICE A DAY. 23 g 11  . latanoprost (XALATAN) 0.005 % ophthalmic solution Place 1 drop into both eyes at bedtime.     . meloxicam (MOBIC) 15 MG tablet TAKE 1 TABLET EACH DAY. 90 tablet 3  . Multiple Vitamins-Minerals (MULTIPLE VITAMINS/WOMENS) tablet Take 2 tablets by mouth daily.    . Omega-3 Fatty Acids (FISH OIL) 1000 MG CAPS Take 2 capsules by mouth 2 (two) times daily.    Marland Kitchen  omeprazole (PRILOSEC) 20 MG capsule Take 20 mg by mouth daily as needed.     . timolol (TIMOPTIC-XR) 0.5 % ophthalmic gel-forming     . vitamin E 400 UNIT capsule Take 400 Units by mouth daily.    . cefUROXime (CEFTIN) 500 MG tablet Take 1 tablet (500 mg total) by mouth 2 (two) times daily. 20 tablet 0   No facility-administered medications prior to visit.    ROS Review of Systems  Constitutional: Negative.  Negative for fever, chills, diaphoresis, appetite change and fatigue.  HENT: Negative.   Eyes: Negative.   Respiratory: Negative.   Cardiovascular: Negative.  Negative for chest pain, palpitations and leg swelling.  Gastrointestinal: Negative.  Negative for nausea, vomiting, abdominal pain, diarrhea and constipation.  Endocrine: Negative.   Genitourinary: Negative.   Musculoskeletal:  Positive for arthralgias. Negative for myalgias, back pain, gait problem and neck pain.  Skin: Negative.  Negative for color change and rash.  Allergic/Immunologic: Negative.   Neurological: Negative.  Negative for dizziness, tremors, syncope, light-headedness, numbness and headaches.  Hematological: Negative for adenopathy. Does not bruise/bleed easily.    Objective:  BP 144/80 mmHg  Pulse 79  Temp(Src) 97.7 F (36.5 C) (Oral)  Resp 16  Ht 5\' 2"  (1.575 m)  Wt 150 lb (68.04 kg)  BMI 27.43 kg/m2  SpO2 96%  BP Readings from Last 3 Encounters:  10/30/15 144/80  08/14/15 126/80  07/04/15 110/70    Wt Readings from Last 3 Encounters:  10/30/15 150 lb (68.04 kg)  08/14/15 149 lb (67.586 kg)  07/04/15 150 lb 6.4 oz (68.221 kg)    Physical Exam  Constitutional: She is oriented to person, place, and time. No distress.  HENT:  Mouth/Throat: Oropharynx is clear and moist. No oropharyngeal exudate.  Eyes: Conjunctivae are normal. Right eye exhibits no discharge. Left eye exhibits no discharge. No scleral icterus.  Neck: Normal range of motion. Neck supple. No JVD present. No tracheal deviation present. No thyromegaly present.  Cardiovascular: Normal rate, regular rhythm, normal heart sounds and intact distal pulses.  Exam reveals no gallop and no friction rub.   No murmur heard. Pulmonary/Chest: Effort normal and breath sounds normal. No stridor. No respiratory distress. She has no wheezes. She has no rales. She exhibits no tenderness.  Abdominal: Soft. Bowel sounds are normal. She exhibits no distension and no mass. There is no tenderness. There is no rebound and no guarding.  Genitourinary:  GU, rectal, and breast exams were deferred at her request.  Musculoskeletal: Normal range of motion. She exhibits no edema or tenderness.  Lymphadenopathy:    She has no cervical adenopathy.  Neurological: She is oriented to person, place, and time.  Skin: Skin is warm and dry. No rash noted.  She is not diaphoretic. No erythema. No pallor.  Vitals reviewed.   Lab Results  Component Value Date   WBC 6.6 10/30/2015   HGB 12.7 10/30/2015   HCT 38.3 10/30/2015   PLT 232.0 10/30/2015   GLUCOSE 89 10/30/2015   CHOL 222* 10/30/2015   TRIG 154.0* 10/30/2015   HDL 80.90 10/30/2015   LDLDIRECT 97.0 11/10/2012   LDLCALC 111* 10/30/2015   ALT 16 10/30/2015   AST 20 10/30/2015   NA 142 10/30/2015   K 4.2 10/30/2015   CL 106 10/30/2015   CREATININE 0.89 10/30/2015   BUN 24* 10/30/2015   CO2 29 10/30/2015   TSH 1.50 10/30/2015   INR 0.91 10/06/2011    No results found.  Assessment & Plan:  Katelin was seen today for annual exam, osteoarthritis and hyperlipidemia.  Diagnoses and all orders for this visit:  Hyperlipidemia with target LDL less than 130- she is achieved her LDL goal and there is no indication for her to start a statin. -     Lipid panel; Future -     Comprehensive metabolic panel; Future -     CBC with Differential/Platelet; Future -     TSH; Future  Osteoporosis- she is not adjusting and having a follow-up bone mineral density done at this time. -     Comprehensive metabolic panel; Future -     CBC with Differential/Platelet; Future  Allergic rhinitis due to pollen- her symptoms are well controlled.  Routine general medical examination at a health care facility   I have discontinued Ms. Tietze's cefUROXime. I am also having her maintain her omeprazole, latanoprost, acetaminophen, timolol, MULTIPLE VITAMINS/WOMENS, Fish Oil, cholecalciferol, vitamin C, vitamin E, DYMISTA, and meloxicam.  No orders of the defined types were placed in this encounter.   See AVS for instructions about healthy living and anticipatory guidance.  Follow-up: Return in about 6 months (around 05/01/2016).  Scarlette Calico, MD

## 2015-10-30 NOTE — Patient Instructions (Signed)
Preventive Care for Adults, Female A healthy lifestyle and preventive care can promote health and wellness. Preventive health guidelines for women include the following key practices.  A routine yearly physical is a good way to check with your health care provider about your health and preventive screening. It is a chance to share any concerns and updates on your health and to receive a thorough exam.  Visit your dentist for a routine exam and preventive care every 6 months. Brush your teeth twice a day and floss once a day. Good oral hygiene prevents tooth decay and gum disease.  The frequency of eye exams is based on your age, health, family medical history, use of contact lenses, and other factors. Follow your health care provider's recommendations for frequency of eye exams.  Eat a healthy diet. Foods like vegetables, fruits, whole grains, low-fat dairy products, and lean protein foods contain the nutrients you need without too many calories. Decrease your intake of foods high in solid fats, added sugars, and salt. Eat the right amount of calories for you.Get information about a proper diet from your health care provider, if necessary.  Regular physical exercise is one of the most important things you can do for your health. Most adults should get at least 150 minutes of moderate-intensity exercise (any activity that increases your heart rate and causes you to sweat) each week. In addition, most adults need muscle-strengthening exercises on 2 or more days a week.  Maintain a healthy weight. The body mass index (BMI) is a screening tool to identify possible weight problems. It provides an estimate of body fat based on height and weight. Your health care provider can find your BMI and can help you achieve or maintain a healthy weight.For adults 20 years and older:  A BMI below 18.5 is considered underweight.  A BMI of 18.5 to 24.9 is normal.  A BMI of 25 to 29.9 is considered overweight.  A  BMI of 30 and above is considered obese.  Maintain normal blood lipids and cholesterol levels by exercising and minimizing your intake of saturated fat. Eat a balanced diet with plenty of fruit and vegetables. Blood tests for lipids and cholesterol should begin at age 45 and be repeated every 5 years. If your lipid or cholesterol levels are high, you are over 50, or you are at high risk for heart disease, you may need your cholesterol levels checked more frequently.Ongoing high lipid and cholesterol levels should be treated with medicines if diet and exercise are not working.  If you smoke, find out from your health care provider how to quit. If you do not use tobacco, do not start.  Lung cancer screening is recommended for adults aged 45-80 years who are at high risk for developing lung cancer because of a history of smoking. A yearly low-dose CT scan of the lungs is recommended for people who have at least a 30-pack-year history of smoking and are a current smoker or have quit within the past 15 years. A pack year of smoking is smoking an average of 1 pack of cigarettes a day for 1 year (for example: 1 pack a day for 30 years or 2 packs a day for 15 years). Yearly screening should continue until the smoker has stopped smoking for at least 15 years. Yearly screening should be stopped for people who develop a health problem that would prevent them from having lung cancer treatment.  If you are pregnant, do not drink alcohol. If you are  breastfeeding, be very cautious about drinking alcohol. If you are not pregnant and choose to drink alcohol, do not have more than 1 drink per day. One drink is considered to be 12 ounces (355 mL) of beer, 5 ounces (148 mL) of wine, or 1.5 ounces (44 mL) of liquor.  Avoid use of street drugs. Do not share needles with anyone. Ask for help if you need support or instructions about stopping the use of drugs.  High blood pressure causes heart disease and increases the risk  of stroke. Your blood pressure should be checked at least every 1 to 2 years. Ongoing high blood pressure should be treated with medicines if weight loss and exercise do not work.  If you are 55-79 years old, ask your health care provider if you should take aspirin to prevent strokes.  Diabetes screening is done by taking a blood sample to check your blood glucose level after you have not eaten for a certain period of time (fasting). If you are not overweight and you do not have risk factors for diabetes, you should be screened once every 3 years starting at age 45. If you are overweight or obese and you are 40-70 years of age, you should be screened for diabetes every year as part of your cardiovascular risk assessment.  Breast cancer screening is essential preventive care for women. You should practice "breast self-awareness." This means understanding the normal appearance and feel of your breasts and may include breast self-examination. Any changes detected, no matter how small, should be reported to a health care provider. Women in their 20s and 30s should have a clinical breast exam (CBE) by a health care provider as part of a regular health exam every 1 to 3 years. After age 40, women should have a CBE every year. Starting at age 40, women should consider having a mammogram (breast X-ray test) every year. Women who have a family history of breast cancer should talk to their health care provider about genetic screening. Women at a high risk of breast cancer should talk to their health care providers about having an MRI and a mammogram every year.  Breast cancer gene (BRCA)-related cancer risk assessment is recommended for women who have family members with BRCA-related cancers. BRCA-related cancers include breast, ovarian, tubal, and peritoneal cancers. Having family members with these cancers may be associated with an increased risk for harmful changes (mutations) in the breast cancer genes BRCA1 and  BRCA2. Results of the assessment will determine the need for genetic counseling and BRCA1 and BRCA2 testing.  Your health care provider may recommend that you be screened regularly for cancer of the pelvic organs (ovaries, uterus, and vagina). This screening involves a pelvic examination, including checking for microscopic changes to the surface of your cervix (Pap test). You may be encouraged to have this screening done every 3 years, beginning at age 21.  For women ages 30-65, health care providers may recommend pelvic exams and Pap testing every 3 years, or they may recommend the Pap and pelvic exam, combined with testing for human papilloma virus (HPV), every 5 years. Some types of HPV increase your risk of cervical cancer. Testing for HPV may also be done on women of any age with unclear Pap test results.  Other health care providers may not recommend any screening for nonpregnant women who are considered low risk for pelvic cancer and who do not have symptoms. Ask your health care provider if a screening pelvic exam is right for   you.  If you have had past treatment for cervical cancer or a condition that could lead to cancer, you need Pap tests and screening for cancer for at least 20 years after your treatment. If Pap tests have been discontinued, your risk factors (such as having a new sexual partner) need to be reassessed to determine if screening should resume. Some women have medical problems that increase the chance of getting cervical cancer. In these cases, your health care provider may recommend more frequent screening and Pap tests.  Colorectal cancer can be detected and often prevented. Most routine colorectal cancer screening begins at the age of 50 years and continues through age 75 years. However, your health care provider may recommend screening at an earlier age if you have risk factors for colon cancer. On a yearly basis, your health care provider may provide home test kits to check  for hidden blood in the stool. Use of a small camera at the end of a tube, to directly examine the colon (sigmoidoscopy or colonoscopy), can detect the earliest forms of colorectal cancer. Talk to your health care provider about this at age 50, when routine screening begins. Direct exam of the colon should be repeated every 5-10 years through age 75 years, unless early forms of precancerous polyps or small growths are found.  People who are at an increased risk for hepatitis B should be screened for this virus. You are considered at high risk for hepatitis B if:  You were born in a country where hepatitis B occurs often. Talk with your health care provider about which countries are considered high risk.  Your parents were born in a high-risk country and you have not received a shot to protect against hepatitis B (hepatitis B vaccine).  You have HIV or AIDS.  You use needles to inject street drugs.  You live with, or have sex with, someone who has hepatitis B.  You get hemodialysis treatment.  You take certain medicines for conditions like cancer, organ transplantation, and autoimmune conditions.  Hepatitis C blood testing is recommended for all people born from 1945 through 1965 and any individual with known risks for hepatitis C.  Practice safe sex. Use condoms and avoid high-risk sexual practices to reduce the spread of sexually transmitted infections (STIs). STIs include gonorrhea, chlamydia, syphilis, trichomonas, herpes, HPV, and human immunodeficiency virus (HIV). Herpes, HIV, and HPV are viral illnesses that have no cure. They can result in disability, cancer, and death.  You should be screened for sexually transmitted illnesses (STIs) including gonorrhea and chlamydia if:  You are sexually active and are younger than 24 years.  You are older than 24 years and your health care provider tells you that you are at risk for this type of infection.  Your sexual activity has changed  since you were last screened and you are at an increased risk for chlamydia or gonorrhea. Ask your health care provider if you are at risk.  If you are at risk of being infected with HIV, it is recommended that you take a prescription medicine daily to prevent HIV infection. This is called preexposure prophylaxis (PrEP). You are considered at risk if:  You are sexually active and do not regularly use condoms or know the HIV status of your partner(s).  You take drugs by injection.  You are sexually active with a partner who has HIV.  Talk with your health care provider about whether you are at high risk of being infected with HIV. If   you choose to begin PrEP, you should first be tested for HIV. You should then be tested every 3 months for as long as you are taking PrEP.  Osteoporosis is a disease in which the bones lose minerals and strength with aging. This can result in serious bone fractures or breaks. The risk of osteoporosis can be identified using a bone density scan. Women ages 67 years and over and women at risk for fractures or osteoporosis should discuss screening with their health care providers. Ask your health care provider whether you should take a calcium supplement or vitamin D to reduce the rate of osteoporosis.  Menopause can be associated with physical symptoms and risks. Hormone replacement therapy is available to decrease symptoms and risks. You should talk to your health care provider about whether hormone replacement therapy is right for you.  Use sunscreen. Apply sunscreen liberally and repeatedly throughout the day. You should seek shade when your shadow is shorter than you. Protect yourself by wearing long sleeves, pants, a wide-brimmed hat, and sunglasses year round, whenever you are outdoors.  Once a month, do a whole body skin exam, using a mirror to look at the skin on your back. Tell your health care provider of new moles, moles that have irregular borders, moles that  are larger than a pencil eraser, or moles that have changed in shape or color.  Stay current with required vaccines (immunizations).  Influenza vaccine. All adults should be immunized every year.  Tetanus, diphtheria, and acellular pertussis (Td, Tdap) vaccine. Pregnant women should receive 1 dose of Tdap vaccine during each pregnancy. The dose should be obtained regardless of the length of time since the last dose. Immunization is preferred during the 27th-36th week of gestation. An adult who has not previously received Tdap or who does not know her vaccine status should receive 1 dose of Tdap. This initial dose should be followed by tetanus and diphtheria toxoids (Td) booster doses every 10 years. Adults with an unknown or incomplete history of completing a 3-dose immunization series with Td-containing vaccines should begin or complete a primary immunization series including a Tdap dose. Adults should receive a Td booster every 10 years.  Varicella vaccine. An adult without evidence of immunity to varicella should receive 2 doses or a second dose if she has previously received 1 dose. Pregnant females who do not have evidence of immunity should receive the first dose after pregnancy. This first dose should be obtained before leaving the health care facility. The second dose should be obtained 4-8 weeks after the first dose.  Human papillomavirus (HPV) vaccine. Females aged 13-26 years who have not received the vaccine previously should obtain the 3-dose series. The vaccine is not recommended for use in pregnant females. However, pregnancy testing is not needed before receiving a dose. If a female is found to be pregnant after receiving a dose, no treatment is needed. In that case, the remaining doses should be delayed until after the pregnancy. Immunization is recommended for any person with an immunocompromised condition through the age of 61 years if she did not get any or all doses earlier. During the  3-dose series, the second dose should be obtained 4-8 weeks after the first dose. The third dose should be obtained 24 weeks after the first dose and 16 weeks after the second dose.  Zoster vaccine. One dose is recommended for adults aged 30 years or older unless certain conditions are present.  Measles, mumps, and rubella (MMR) vaccine. Adults born  before 1957 generally are considered immune to measles and mumps. Adults born in 1957 or later should have 1 or more doses of MMR vaccine unless there is a contraindication to the vaccine or there is laboratory evidence of immunity to each of the three diseases. A routine second dose of MMR vaccine should be obtained at least 28 days after the first dose for students attending postsecondary schools, health care workers, or international travelers. People who received inactivated measles vaccine or an unknown type of measles vaccine during 1963-1967 should receive 2 doses of MMR vaccine. People who received inactivated mumps vaccine or an unknown type of mumps vaccine before 1979 and are at high risk for mumps infection should consider immunization with 2 doses of MMR vaccine. For females of childbearing age, rubella immunity should be determined. If there is no evidence of immunity, females who are not pregnant should be vaccinated. If there is no evidence of immunity, females who are pregnant should delay immunization until after pregnancy. Unvaccinated health care workers born before 1957 who lack laboratory evidence of measles, mumps, or rubella immunity or laboratory confirmation of disease should consider measles and mumps immunization with 2 doses of MMR vaccine or rubella immunization with 1 dose of MMR vaccine.  Pneumococcal 13-valent conjugate (PCV13) vaccine. When indicated, a person who is uncertain of his immunization history and has no record of immunization should receive the PCV13 vaccine. All adults 65 years of age and older should receive this  vaccine. An adult aged 19 years or older who has certain medical conditions and has not been previously immunized should receive 1 dose of PCV13 vaccine. This PCV13 should be followed with a dose of pneumococcal polysaccharide (PPSV23) vaccine. Adults who are at high risk for pneumococcal disease should obtain the PPSV23 vaccine at least 8 weeks after the dose of PCV13 vaccine. Adults older than 80 years of age who have normal immune system function should obtain the PPSV23 vaccine dose at least 1 year after the dose of PCV13 vaccine.  Pneumococcal polysaccharide (PPSV23) vaccine. When PCV13 is also indicated, PCV13 should be obtained first. All adults aged 65 years and older should be immunized. An adult younger than age 65 years who has certain medical conditions should be immunized. Any person who resides in a nursing home or long-term care facility should be immunized. An adult smoker should be immunized. People with an immunocompromised condition and certain other conditions should receive both PCV13 and PPSV23 vaccines. People with human immunodeficiency virus (HIV) infection should be immunized as soon as possible after diagnosis. Immunization during chemotherapy or radiation therapy should be avoided. Routine use of PPSV23 vaccine is not recommended for American Indians, [place] Natives, or people younger than 65 years unless there are medical conditions that require PPSV23 vaccine. When indicated, people who have unknown immunization and have no record of immunization should receive PPSV23 vaccine. One-time revaccination 5 years after the first dose of PPSV23 is recommended for people aged 19-64 years who have chronic kidney failure, nephrotic syndrome, asplenia, or immunocompromised conditions. People who received 1-2 doses of PPSV23 before age 65 years should receive another dose of PPSV23 vaccine at age 65 years or later if at least 5 years have passed since the previous dose. Doses of PPSV23 are not  needed for people immunized with PPSV23 at or after age 65 years.  Meningococcal vaccine. Adults with asplenia or persistent complement component deficiencies should receive 2 doses of quadrivalent meningococcal conjugate (MenACWY-D) vaccine. The doses should be obtained   at least 2 months apart. Microbiologists working with certain meningococcal bacteria, [place] recruits, people at risk during an outbreak, and people who travel to or live in countries with a high rate of meningitis should be immunized. A first-year college student up through age 34 years who is living in a residence hall should receive a dose if she did not receive a dose on or after her 16th birthday. Adults who have certain high-risk conditions should receive one or more doses of vaccine.  Hepatitis A vaccine. Adults who wish to be protected from this disease, have certain high-risk conditions, work with hepatitis A-infected animals, work in hepatitis A research labs, or travel to or work in countries with a high rate of hepatitis A should be immunized. Adults who were previously unvaccinated and who anticipate close contact with an international adoptee during the first 60 days after arrival in the [place] from a country with a high rate of hepatitis A should be immunized.  Hepatitis B vaccine. Adults who wish to be protected from this disease, have certain high-risk conditions, may be exposed to blood or other infectious body fluids, are household contacts or sex partners of hepatitis B positive people, are clients or workers in certain care facilities, or travel to or work in countries with a high rate of hepatitis B should be immunized.  Haemophilus influenzae type b (Hib) vaccine. A previously unvaccinated person with asplenia or sickle cell disease or having a scheduled splenectomy should receive 1 dose of Hib vaccine. Regardless of previous immunization, a recipient of a hematopoietic stem cell transplant should receive a  3-dose series 6-12 months after her successful transplant. Hib vaccine is not recommended for adults with HIV infection. Preventive Services / Frequency Ages 35 to 4 years  Blood pressure check.** / Every 3-5 years.  Lipid and cholesterol check.** / Every 5 years beginning at age 60.  Clinical breast exam.** / Every 3 years for women in their 71s and 10s.  BRCA-related cancer risk assessment.** / For women who have family members with a BRCA-related cancer (breast, ovarian, tubal, or peritoneal cancers).  Pap test.** / Every 2 years from ages 76 through 26. Every 3 years starting at age 61 through age 76 or 93 with a history of 3 consecutive normal Pap tests.  HPV screening.** / Every 3 years from ages 37 through ages 60 to 51 with a history of 3 consecutive normal Pap tests.  Hepatitis C blood test.** / For any individual with known risks for hepatitis C.  Skin self-exam. / Monthly.  Influenza vaccine. / Every year.  Tetanus, diphtheria, and acellular pertussis (Tdap, Td) vaccine.** / Consult your health care provider. Pregnant women should receive 1 dose of Tdap vaccine during each pregnancy. 1 dose of Td every 10 years.  Varicella vaccine.** / Consult your health care provider. Pregnant females who do not have evidence of immunity should receive the first dose after pregnancy.  HPV vaccine. / 3 doses over 6 months, if 93 and younger. The vaccine is not recommended for use in pregnant females. However, pregnancy testing is not needed before receiving a dose.  Measles, mumps, rubella (MMR) vaccine.** / You need at least 1 dose of MMR if you were born in 1957 or later. You may also need a 2nd dose. For females of childbearing age, rubella immunity should be determined. If there is no evidence of immunity, females who are not pregnant should be vaccinated. If there is no evidence of immunity, females who are  pregnant should delay immunization until after pregnancy.  Pneumococcal  13-valent conjugate (PCV13) vaccine.** / Consult your health care provider.  Pneumococcal polysaccharide (PPSV23) vaccine.** / 1 to 2 doses if you smoke cigarettes or if you have certain conditions.  Meningococcal vaccine.** / 1 dose if you are age 68 to 8 years and a Market researcher living in a residence hall, or have one of several medical conditions, you need to get vaccinated against meningococcal disease. You may also need additional booster doses.  Hepatitis A vaccine.** / Consult your health care provider.  Hepatitis B vaccine.** / Consult your health care provider.  Haemophilus influenzae type b (Hib) vaccine.** / Consult your health care provider. Ages 7 to 53 years  Blood pressure check.** / Every year.  Lipid and cholesterol check.** / Every 5 years beginning at age 25 years.  Lung cancer screening. / Every year if you are aged 11-80 years and have a 30-pack-year history of smoking and currently smoke or have quit within the past 15 years. Yearly screening is stopped once you have quit smoking for at least 15 years or develop a health problem that would prevent you from having lung cancer treatment.  Clinical breast exam.** / Every year after age 48 years.  BRCA-related cancer risk assessment.** / For women who have family members with a BRCA-related cancer (breast, ovarian, tubal, or peritoneal cancers).  Mammogram.** / Every year beginning at age 41 years and continuing for as long as you are in good health. Consult with your health care provider.  Pap test.** / Every 3 years starting at age 65 years through age 37 or 70 years with a history of 3 consecutive normal Pap tests.  HPV screening.** / Every 3 years from ages 72 years through ages 60 to 40 years with a history of 3 consecutive normal Pap tests.  Fecal occult blood test (FOBT) of stool. / Every year beginning at age 21 years and continuing until age 5 years. You may not need to do this test if you get  a colonoscopy every 10 years.  Flexible sigmoidoscopy or colonoscopy.** / Every 5 years for a flexible sigmoidoscopy or every 10 years for a colonoscopy beginning at age 35 years and continuing until age 48 years.  Hepatitis C blood test.** / For all people born from [birth year] through 1965 and any individual with known risks for hepatitis C.  Skin self-exam. / Monthly.  Influenza vaccine. / Every year.  Tetanus, diphtheria, and acellular pertussis (Tdap/Td) vaccine.** / Consult your health care provider. Pregnant women should receive 1 dose of Tdap vaccine during each pregnancy. 1 dose of Td every 10 years.  Varicella vaccine.** / Consult your health care provider. Pregnant females who do not have evidence of immunity should receive the first dose after pregnancy.  Zoster vaccine.** / 1 dose for adults aged 30 years or older.  Measles, mumps, rubella (MMR) vaccine.** / You need at least 1 dose of MMR if you were born in 1957 or later. You may also need a second dose. For females of childbearing age, rubella immunity should be determined. If there is no evidence of immunity, females who are not pregnant should be vaccinated. If there is no evidence of immunity, females who are pregnant should delay immunization until after pregnancy.  Pneumococcal 13-valent conjugate (PCV13) vaccine.** / Consult your health care provider.  Pneumococcal polysaccharide (PPSV23) vaccine.** / 1 to 2 doses if you smoke cigarettes or if you have certain conditions.  Meningococcal vaccine.** /  Consult your health care provider.  Hepatitis A vaccine.** / Consult your health care provider.  Hepatitis B vaccine.** / Consult your health care provider.  Haemophilus influenzae type b (Hib) vaccine.** / Consult your health care provider. Ages 64 years and over  Blood pressure check.** / Every year.  Lipid and cholesterol check.** / Every 5 years beginning at age 23 years.  Lung cancer screening. / Every year if you  are aged 16-80 years and have a 30-pack-year history of smoking and currently smoke or have quit within the past 15 years. Yearly screening is stopped once you have quit smoking for at least 15 years or develop a health problem that would prevent you from having lung cancer treatment.  Clinical breast exam.** / Every year after age 74 years.  BRCA-related cancer risk assessment.** / For women who have family members with a BRCA-related cancer (breast, ovarian, tubal, or peritoneal cancers).  Mammogram.** / Every year beginning at age 44 years and continuing for as long as you are in good health. Consult with your health care provider.  Pap test.** / Every 3 years starting at age 58 years through age 22 or 39 years with 3 consecutive normal Pap tests. Testing can be stopped between 65 and 70 years with 3 consecutive normal Pap tests and no abnormal Pap or HPV tests in the past 10 years.  HPV screening.** / Every 3 years from ages 64 years through ages 70 or 61 years with a history of 3 consecutive normal Pap tests. Testing can be stopped between 65 and 70 years with 3 consecutive normal Pap tests and no abnormal Pap or HPV tests in the past 10 years.  Fecal occult blood test (FOBT) of stool. / Every year beginning at age 40 years and continuing until age 27 years. You may not need to do this test if you get a colonoscopy every 10 years.  Flexible sigmoidoscopy or colonoscopy.** / Every 5 years for a flexible sigmoidoscopy or every 10 years for a colonoscopy beginning at age 7 years and continuing until age 32 years.  Hepatitis C blood test.** / For all people born from [birth year] through 1963 and any individual with known risks for hepatitis C.  Osteoporosis screening.** / A one-time screening for women ages 30 years and over and women at risk for fractures or osteoporosis.  Skin self-exam. / Monthly.  Influenza vaccine. / Every year.  Tetanus, diphtheria, and acellular pertussis (Tdap/Td)  vaccine.** / 1 dose of Td every 10 years.  Varicella vaccine.** / Consult your health care provider.  Zoster vaccine.** / 1 dose for adults aged 35 years or older.  Pneumococcal 13-valent conjugate (PCV13) vaccine.** / Consult your health care provider.  Pneumococcal polysaccharide (PPSV23) vaccine.** / 1 dose for all adults aged 46 years and older.  Meningococcal vaccine.** / Consult your health care provider.  Hepatitis A vaccine.** / Consult your health care provider.  Hepatitis B vaccine.** / Consult your health care provider.  Haemophilus influenzae type b (Hib) vaccine.** / Consult your health care provider. ** Family history and personal history of risk and conditions may change your health care provider's recommendations.   This information is not intended to replace advice given to you by your health care provider. Make sure you discuss any questions you have with your health care provider.   Document Released: 07/15/2001 Document Revised: 06/09/2014 Document Reviewed: 10/14/2010 Elsevier Interactive Patient Education [employer].

## 2015-10-30 NOTE — Progress Notes (Signed)
Pre visit review using our clinic review tool, if applicable. No additional management support is needed unless otherwise documented below in the visit note. 

## 2015-11-01 DIAGNOSIS — Z0000 Encounter for general adult medical examination without abnormal findings: Secondary | ICD-10-CM | POA: Insufficient documentation

## 2015-11-01 NOTE — Assessment & Plan Note (Signed)

## 2015-11-06 DIAGNOSIS — H6121 Impacted cerumen, right ear: Secondary | ICD-10-CM | POA: Insufficient documentation

## 2015-11-06 DIAGNOSIS — G44219 Episodic tension-type headache, not intractable: Secondary | ICD-10-CM | POA: Insufficient documentation

## 2016-01-08 ENCOUNTER — Other Ambulatory Visit (INDEPENDENT_AMBULATORY_CARE_PROVIDER_SITE_OTHER): Payer: Medicare Other

## 2016-01-08 ENCOUNTER — Encounter: Payer: Self-pay | Admitting: Internal Medicine

## 2016-01-08 ENCOUNTER — Ambulatory Visit (INDEPENDENT_AMBULATORY_CARE_PROVIDER_SITE_OTHER): Payer: Medicare Other | Admitting: Internal Medicine

## 2016-01-08 VITALS — BP 122/80 | HR 72 | Temp 98.1°F | Resp 16 | Ht 62.0 in | Wt 147.2 lb

## 2016-01-08 DIAGNOSIS — K580 Irritable bowel syndrome with diarrhea: Secondary | ICD-10-CM

## 2016-01-08 DIAGNOSIS — R197 Diarrhea, unspecified: Secondary | ICD-10-CM | POA: Insufficient documentation

## 2016-01-08 DIAGNOSIS — A09 Infectious gastroenteritis and colitis, unspecified: Secondary | ICD-10-CM | POA: Diagnosis not present

## 2016-01-08 LAB — COMPREHENSIVE METABOLIC PANEL
ALBUMIN: 4 g/dL (ref 3.5–5.2)
ALK PHOS: 132 U/L — AB (ref 39–117)
ALT: 46 U/L — ABNORMAL HIGH (ref 0–35)
AST: 42 U/L — AB (ref 0–37)
BILIRUBIN TOTAL: 0.7 mg/dL (ref 0.2–1.2)
BUN: 14 mg/dL (ref 6–23)
CALCIUM: 9.6 mg/dL (ref 8.4–10.5)
CHLORIDE: 107 meq/L (ref 96–112)
CO2: 27 mEq/L (ref 19–32)
CREATININE: 0.88 mg/dL (ref 0.40–1.20)
GFR: 65.13 mL/min (ref >60.00)
Glucose, Bld: 104 mg/dL — ABNORMAL HIGH (ref 70–99)
Potassium: 4.3 mEq/L (ref 3.5–5.1)
Sodium: 142 mEq/L (ref 135–145)
Total Protein: 6.9 g/dL (ref 6.0–8.3)

## 2016-01-08 LAB — CBC WITH DIFFERENTIAL/PLATELET
BASOS ABS: 0 10*3/uL (ref 0.0–0.1)
BASOS PCT: 0.4 % (ref 0.0–3.0)
EOS ABS: 0.2 10*3/uL (ref 0.0–0.7)
Eosinophils Relative: 2.1 % (ref 0.0–5.0)
HEMATOCRIT: 40.1 % (ref 36.0–46.0)
HEMOGLOBIN: 13.4 g/dL (ref 12.0–15.0)
LYMPHS PCT: 29.3 % (ref 12.0–46.0)
Lymphs Abs: 2.1 10*3/uL (ref 0.7–4.0)
MCHC: 33.5 g/dL (ref 30.0–36.0)
MCV: 87.9 fl (ref 78.0–100.0)
Monocytes Absolute: 0.5 10*3/uL (ref 0.1–1.0)
Monocytes Relative: 6.8 % (ref 3.0–12.0)
Neutro Abs: 4.5 10*3/uL (ref 1.4–7.7)
Neutrophils Relative %: 61.4 % (ref 43.0–77.0)
Platelets: 281 10*3/uL (ref 150.0–400.0)
RBC: 4.56 Mil/uL (ref 3.87–5.11)
RDW: 14.9 % (ref 11.5–15.5)
WBC: 7.3 10*3/uL (ref 4.0–10.5)

## 2016-01-08 LAB — SEDIMENTATION RATE: Sed Rate: 12 mm/hr (ref 0–30)

## 2016-01-08 MED ORDER — RIFAXIMIN 550 MG PO TABS: 550.0000 mg | ORAL_TABLET | Freq: Three times a day (TID) | ORAL | 0 refills | Status: AC

## 2016-01-08 NOTE — Progress Notes (Signed)
Subjective:  Patient ID: Leah [name], female    DOB: 05/07/1930  Age: 82 y.o. MRN: [id number]  CC: Abdominal Pain and Diarrhea   HPI Leah [name] presents for a 2 week history of loose and watery stools - up to about 6 times per day with intermittent lower abdominal cramping and bloating. She has not recently taken antibiotics, she is not aware of any particular foods like wheat or dairy products that exacerbate the symptoms. She has no recent travel. She denies loss of appetite, nausea, vomiting, fever, or chills. She has gotten symptom relief with Imodium right ear and has no diarrhea while she is asleep.  Outpatient Medications Prior to Visit  Medication Sig Dispense Refill  . acetaminophen (TYLENOL) 500 MG tablet Take 1,000 mg by mouth every 6 (six) hours as needed.    . Ascorbic Acid (VITAMIN C) 1000 MG tablet Take 1,000 mg by mouth daily.    . cholecalciferol (VITAMIN D) 1000 UNITS tablet Take 1,000 Units by mouth daily.    Marland Kitchen DYMISTA 137-50 MCG/ACT SUSP USE 1 SPRAY IN EACH NOSTRIL TWICE A DAY. 23 g 11  . latanoprost (XALATAN) 0.005 % ophthalmic solution Place 1 drop into both eyes at bedtime.     . meloxicam (MOBIC) 15 MG tablet TAKE 1 TABLET EACH DAY. 90 tablet 3  . Multiple Vitamins-Minerals (MULTIPLE VITAMINS/WOMENS) tablet Take 2 tablets by mouth daily.    . Omega-3 Fatty Acids (FISH OIL) 1000 MG CAPS Take 2 capsules by mouth 2 (two) times daily.    Marland Kitchen omeprazole (PRILOSEC) 20 MG capsule Take 20 mg by mouth daily as needed.     . timolol (TIMOPTIC-XR) 0.5 % ophthalmic gel-forming     . vitamin E 400 UNIT capsule Take 400 Units by mouth daily.     No facility-administered medications prior to visit.     ROS Review of Systems  Constitutional: Negative.  Negative for activity change, appetite change, chills, fatigue and fever.  HENT: Negative.  Negative for sore throat and trouble swallowing.   Eyes: Negative.   Respiratory: Negative.  Negative for cough, shortness  of breath and stridor.   Cardiovascular: Negative.  Negative for chest pain, palpitations and leg swelling.  Gastrointestinal: Positive for abdominal pain and diarrhea. Negative for abdominal distention, blood in stool, constipation, nausea and vomiting.  Endocrine: Negative.   Genitourinary: Negative.   Musculoskeletal: Negative.  Negative for arthralgias.  Skin: Negative.  Negative for color change and rash.  Allergic/Immunologic: Negative.   Neurological: Negative.  Negative for dizziness, weakness, light-headedness and numbness.  Hematological: Negative.  Negative for adenopathy. Does not bruise/bleed easily.  Psychiatric/Behavioral: Negative.     Objective:  BP 122/80 (BP Location: Left Arm, Patient Position: Sitting, Cuff Size: Normal)   Pulse 72   Temp 98.1 F (36.7 C) (Oral)   Resp 16   Ht 5\' 2"  (1.575 m)   Wt 147 lb 4 oz (66.8 kg)   SpO2 95%   BMI 26.93 kg/m   BP Readings from Last 3 Encounters:  01/08/16 122/80  10/30/15 (!) 144/80  08/14/15 126/80    Wt Readings from Last 3 Encounters:  01/08/16 147 lb 4 oz (66.8 kg)  10/30/15 150 lb (68 kg)  08/14/15 149 lb (67.6 kg)    Physical Exam  Constitutional: She is oriented to person, place, and time.  Non-toxic appearance. She does not have a sickly appearance. She does not appear ill. No distress.  HENT:  Mouth/Throat: Oropharynx is clear and  moist. No oropharyngeal exudate.  Eyes: Conjunctivae are normal. Right eye exhibits no discharge. Left eye exhibits no discharge. No scleral icterus.  Neck: Normal range of motion. Neck supple. No JVD present. No tracheal deviation present. No thyromegaly present.  Cardiovascular: Normal rate, regular rhythm, normal heart sounds and intact distal pulses.  Exam reveals no gallop and no friction rub.   No murmur heard. Pulmonary/Chest: Effort normal and breath sounds normal. No stridor. No respiratory distress. She has no wheezes. She has no rales. She exhibits no tenderness.    Abdominal: Soft. Bowel sounds are normal. She exhibits no distension and no mass. There is no tenderness. There is no rebound and no guarding.  Musculoskeletal: Normal range of motion. She exhibits no edema, tenderness or deformity.  Lymphadenopathy:    She has no cervical adenopathy.  Neurological: She is oriented to person, place, and time.  Skin: Skin is warm and dry. No rash noted. She is not diaphoretic. No erythema. No pallor.  Vitals reviewed.   Lab Results  Component Value Date   WBC 7.3 01/08/2016   HGB 13.4 01/08/2016   HCT 40.1 01/08/2016   PLT 281.0 01/08/2016   GLUCOSE 104 (H) 01/08/2016   CHOL 222 (H) 10/30/2015   TRIG 154.0 (H) 10/30/2015   HDL 80.90 10/30/2015   LDLDIRECT 97.0 11/10/2012   LDLCALC 111 (H) 10/30/2015   ALT 46 (H) 01/08/2016   AST 42 (H) 01/08/2016   NA 142 01/08/2016   K 4.3 01/08/2016   CL 107 01/08/2016   CREATININE 0.88 01/08/2016   BUN 14 01/08/2016   CO2 27 01/08/2016   TSH 1.50 10/30/2015   INR 0.91 10/06/2011    No results found.  Assessment & Plan:   Leah [name] was seen today for abdominal pain and diarrhea.  Diagnoses and all orders for this visit:  Diarrhea of presumed infectious origin- I've asked her to submit stool to screen for enteric pathogens and to check a lactoferrin to screen for infection or inflammation. Her white blood cell count is normal so I don't think she has C. difficile colitis, she most likely has a recent episode of viral gastroenteritis with possibly IBS with diarrhea flare or bacterial overgrowth. -     Comprehensive metabolic panel; Future -     CBC with Differential/Platelet; Future -     Sedimentation rate; Future -     Fecal lactoferrin, quant; Future -     Gastrointestinal Pathogen Panel PCR; Future  Irritable bowel syndrome with diarrhea- will treat this with Xifaxan -     rifaximin (XIFAXAN) 550 MG TABS tablet; Take 1 tablet (550 mg total) by mouth 3 (three) times daily.   I am having Ms.  Leah [name] start on rifaximin. I am also having her maintain her omeprazole, latanoprost, acetaminophen, timolol, MULTIPLE VITAMINS/WOMENS, Fish Oil, cholecalciferol, vitamin C, vitamin E, DYMISTA, meloxicam, and timolol.  Meds ordered this encounter  Medications  . timolol (TIMOPTIC-XR) 0.5 % ophthalmic gel-forming  . rifaximin (XIFAXAN) 550 MG TABS tablet    Sig: Take 1 tablet (550 mg total) by mouth 3 (three) times daily.    Dispense:  42 tablet    Refill:  0     Follow-up: Return in about 3 weeks (around 01/29/2016).  Scarlette Calico, MD

## 2016-01-08 NOTE — Patient Instructions (Signed)

## 2016-01-08 NOTE — Progress Notes (Signed)
Pre visit review using our clinic review tool, if applicable. No additional management support is needed unless otherwise documented below in the visit note. 

## 2016-01-09 ENCOUNTER — Other Ambulatory Visit: Payer: Medicare Other

## 2016-01-09 DIAGNOSIS — R197 Diarrhea, unspecified: Secondary | ICD-10-CM

## 2016-01-10 LAB — FECAL LACTOFERRIN, QUANT: LACTOFERRIN: POSITIVE

## 2016-01-11 LAB — GASTROINTESTINAL PATHOGEN PANEL PCR
C. difficile Tox A/B, PCR: NOT DETECTED
CAMPYLOBACTER, PCR: NOT DETECTED
CRYPTOSPORIDIUM, PCR: NOT DETECTED
E COLI (ETEC) LT/ST, PCR: NOT DETECTED
E COLI 0157, PCR: NOT DETECTED
E coli (STEC) stx1/stx2, PCR: NOT DETECTED
GIARDIA LAMBLIA, PCR: NOT DETECTED
Norovirus, PCR: NOT DETECTED
Rotavirus A, PCR: NOT DETECTED
Salmonella, PCR: NOT DETECTED
Shigella, PCR: NOT DETECTED

## 2016-01-14 ENCOUNTER — Telehealth: Payer: Self-pay | Admitting: Internal Medicine

## 2016-01-14 NOTE — Telephone Encounter (Signed)
Pt called in an has a few questions about her blood and her stool test.  I told her that the stool test was neg but has question about the medication and her blood work.   Home number is best

## 2016-01-15 ENCOUNTER — Telehealth: Payer: Self-pay

## 2016-01-15 NOTE — Telephone Encounter (Signed)
PA initiated via CoverMyMesd key [facility]

## 2016-01-15 NOTE — Telephone Encounter (Signed)
PA APPROVED, pharmacy notified via CoverMyMeds

## 2016-01-15 NOTE — Telephone Encounter (Signed)
PA for Xifaxan has been APPROVED

## 2016-01-15 NOTE — Telephone Encounter (Signed)
Patient called back. Advised that you were in a room. She states that she will be home for the afternoon after 1.   Additionally, she has questions about rifaximin (XIFAXAN) 550 MG TABS tablet [id number]  . She states that it is not covered by her insurance and that she needs an alternative if she is to continue taking it.   Once more,  She states that she started taking a probiotic this week. She wants to inform us of this.

## 2016-01-16 ENCOUNTER — Telehealth: Payer: Self-pay

## 2016-01-16 ENCOUNTER — Telehealth: Payer: Self-pay | Admitting: Internal Medicine

## 2016-01-16 NOTE — Telephone Encounter (Signed)
Please advise, [facility] called for patient, she states that the medication prescribed for her diarrhea last week is not covered by her insurance and is wanting something else to be sent in due to her feeling worse.

## 2016-01-16 NOTE — Telephone Encounter (Signed)
PLEASE NOTE: All timestamps contained within this report are represented as [place] Standard Time. CONFIDENTIALTY NOTICE: This fax transmission is intended only for the addressee. It contains information that is legally privileged, confidential or otherwise protected from use or disclosure. If you are not the intended recipient, you are strictly prohibited from reviewing, disclosing, copying using or disseminating any of this information or taking any action in reliance on or regarding this information. If you have received this fax in error, please notify us immediately by telephone so that we can arrange for its return to [place]. Phone: [phone], Toll-Free: [phone], Fax: [phone] Page: 1 of 1 Call Id: [id number] [facility] - Client [place] Patient Name: Leah [name] DOB: 1929-11-23 Initial Comment Caller says, called yesterday, has not gotten a cb. She was seen last week, for diarrhea. Her Rx was refused by [facility]. She feels like she is getting worse. Nurse Assessment Nurse: Dimas Chyle, RN, Dellis Filbert Date/Time Eilene Ghazi Time): 01/16/2016 8:02:02 AM Confirm and document reason for call. If symptomatic, describe symptoms. You must click the next button to save text entered. ---Caller says, called yesterday, has not gotten a cb. She was seen last week, for diarrhea. Her Rx was refused by [facility]. She feels like she is getting worse. Seen last Tuesday and prescribed Xiafin. Has the patient traveled out of the country within the last 30 days? ---No Does the patient have any new or worsening symptoms? ---Yes Will a triage be completed? ---Yes Related visit to physician within the last 2 weeks? ---Yes Does the PT have any chronic conditions? (i.e. diabetes, asthma, etc.) ---No Is this a behavioral health or substance abuse call? ---No Guidelines Guideline Title Affirmed Question Affirmed  Notes Diarrhea [1] MILD (e.g., 1-3 or more stools than normal in past 24 hours) diarrhea without known cause AND [2] present > 7 days Final Disposition User See PCP When Office is Open (within 3 days) Dimas Chyle, Therapist, sports, [employer] with office on backline and they were going to send PCP a note about Rx not being covered and the office was going to call her back once they heard back from Dr. Ronnald Ramp. Disagree/Comply: Comply

## 2016-01-16 NOTE — Telephone Encounter (Signed)
LVM for pt to call back as soon as possible.   RE: pt was contacted yesterday.  PA was approved yesterday.

## 2016-01-16 NOTE — Telephone Encounter (Signed)
3 different phone notes regarding the same.

## 2016-01-16 NOTE — Telephone Encounter (Signed)
PA for rx was approved yesterday.   I have left a message for pt regarding same.

## 2016-01-17 NOTE — Telephone Encounter (Signed)
Pt informed of results.

## 2016-01-29 ENCOUNTER — Ambulatory Visit (INDEPENDENT_AMBULATORY_CARE_PROVIDER_SITE_OTHER): Payer: Medicare Other | Admitting: Internal Medicine

## 2016-01-29 ENCOUNTER — Other Ambulatory Visit: Payer: Medicare Other

## 2016-01-29 ENCOUNTER — Telehealth: Payer: Self-pay | Admitting: Internal Medicine

## 2016-01-29 VITALS — BP 118/72 | HR 70 | Temp 98.7°F | Resp 20 | Wt 147.0 lb

## 2016-01-29 DIAGNOSIS — R197 Diarrhea, unspecified: Secondary | ICD-10-CM | POA: Diagnosis not present

## 2016-01-29 DIAGNOSIS — K589 Irritable bowel syndrome without diarrhea: Secondary | ICD-10-CM | POA: Insufficient documentation

## 2016-01-29 MED ORDER — METRONIDAZOLE 250 MG PO TABS: 250.0000 mg | ORAL_TABLET | Freq: Three times a day (TID) | ORAL | 0 refills | Status: AC

## 2016-01-29 NOTE — Telephone Encounter (Signed)
Patient Name: Leah [name]  DOB: 07-15-1929    Initial Comment Caller states she would like the referral to the Gastro MD she still has diarrhea   Nurse Assessment  Nurse: Thad Ranger, RN, Langley Gauss Date/Time (Eastern Time): 01/29/2016 2:26:52 PM  Confirm and document reason for call. If symptomatic, describe symptoms. You must click the next button to save text entered. ---Caller states she would like the referral to the [facility] MD she still has diarrhea. She is on abx and had stool cultured w/lab work completed at PCP office approx 3 wks ago.  Has the patient traveled out of the country within the last 30 days? ---Not Applicable  Does the patient have any new or worsening symptoms? ---Yes  Will a triage be completed? ---Yes  Related visit to physician within the last 2 weeks? ---Yes  Does the PT have any chronic conditions? (i.e. diabetes, asthma, etc.) ---No  Is this a behavioral health or substance abuse call? ---No     Guidelines    Guideline Title Affirmed Question Affirmed Notes  Diarrhea [1] MODERATE diarrhea (e.g., 4-6 times / day more than normal) AND [2] present > 48 hours (2 days)    Final Disposition User   See Physician within 24 Hours Carmon, RN, Langley Gauss    Comments  Appt made today, 01/29/16 with Dr Cathlean Cower at 1515. Pt aware/agreeable.   Referrals  REFERRED TO PCP OFFICE   Disagree/Comply: Comply

## 2016-01-29 NOTE — Assessment & Plan Note (Signed)
Etiology unclear, infectious etiology has been evaluated, cannot r/o inflammatory or other, for celiac panel, finish rifiaxmin (has 2 days left), immodium prn, empiric trial flagyl 250 tid x 10 days, and refer GI ,  to f/u any worsening symptoms or concerns

## 2016-01-29 NOTE — Patient Instructions (Signed)
Please take all new medication as prescribed - the flagyl antibiotic  Please continue all other medications as before, and refills have been done if requested.  Please have the pharmacy call with any other refills you may need.  Please keep your appointments with your specialists as you may have planned  You will be contacted regarding the referral for: Gastroenterology  Please go to the LAB in the Basement (turn left off the elevator) for the tests to be done today  You will be contacted by phone if any changes need to be made immediately.  Otherwise, you will receive a letter about your results with an explanation, but please check with MyChart first.  Please remember to sign up for MyChart if you have not done so, as this will be important to you in the future with finding out test results, communicating by private email, and scheduling acute appointments online when needed.

## 2016-01-29 NOTE — Progress Notes (Signed)
Pre visit review using our clinic review tool, if applicable. No additional management support is needed unless otherwise documented below in the visit note. 

## 2016-01-29 NOTE — Progress Notes (Signed)
Subjective:    Patient ID: Leah [name], female    DOB: 23-Jan-1930, 82 y.o.   MRN: [id number]  HPI  Here to f/u after 6 wks foul smelling watery stools several times per day, ongoing without n/v, fever, worsening abd pain and Denies worsening reflux, dysphagia, constipation or blood.  Pt has been seen per Dr Ronnald Ramp with evaluation negative for specific infection, including c diff.  Rifaximin trial unfortunately no help.  Pt has been reading on internet - now quite concerned about IBS vs IBD or other.  Called GI to self refer but was asked to see PMD first.  Has lost several lbs recently, appetite fair. Wt Readings from Last 3 Encounters:  01/29/16 147 lb (66.7 kg)  01/08/16 147 lb 4 oz (66.8 kg)  10/30/15 150 lb (68 kg)   Past Medical History:  Diagnosis Date  . Acute maxillary sinusitis   . Basal cell carcinoma of nose   . Bundle branch block, unspecified    EKG 4/13 with clearance and note that isnt new block Dr Arnoldo Morale on chart  . Carpal tunnel syndrome   . Cystocele   . Detrusor instability   . GERD (gastroesophageal reflux disease)   . Glaucoma   . Hyperlipidemia   . Localized osteoarthrosis not specified whether primary or secondary, lower leg   . Osteoporosis 01/2014   T score -2.5 UD forarm, stable at the spine recommend 2 year follow up DEXA  . PONV (postoperative nausea and vomiting)    21 yrs ago- states has had general anesthesia since and did well  . Unspecified adverse effect of unspecified drug, medicinal and biological substance   . Uterine prolapse    Past Surgical History:  Procedure Laterality Date  . arthroscopic r knee    . CARPAL TUNNEL RELEASE  2011  . DILATION AND CURETTAGE OF UTERUS  1982  . EYE SURGERY     bilateral cataract extraction  with IOL  . FLEXIBLE SIGMOIDOSCOPY    . HYSTEROSCOPY    . HYSTEROSCOPY W/D&C  2005 and 2008  . JOINT REPLACEMENT     bilateral total hip arthroplasty  . total hip rerplacement bilateral    . TOTAL KNEE  ARTHROPLASTY  10/13/2011   Procedure: TOTAL KNEE ARTHROPLASTY;  Surgeon: Gearlean Alf, MD;  Location: [facility];  Service: Orthopedics;  Laterality: Right;    reports that she quit smoking about 57 years ago. She has never used smokeless tobacco. She reports that she drinks about 0.6 oz of alcohol per week . She reports that she does not use drugs. family history includes Breast cancer (age of onset: 3) in her mother; Dementia in her mother and sister; Heart disease in her father; Stroke in her father. Allergies  Allergen Reactions  . Sulfa Antibiotics     RASH  . Nabumetone Other (See Comments)    Feel weird    . Naproxen Sodium Hives  . Tramadol Hcl     REACTION: caused nausea and vomiting   Current Outpatient Prescriptions on File Prior to Visit  Medication Sig Dispense Refill  . acetaminophen (TYLENOL) 500 MG tablet Take 1,000 mg by mouth every 6 (six) hours as needed.    . Ascorbic Acid (VITAMIN C) 1000 MG tablet Take 1,000 mg by mouth daily.    . cholecalciferol (VITAMIN D) 1000 UNITS tablet Take 1,000 Units by mouth daily.    Marland Kitchen DYMISTA 137-50 MCG/ACT SUSP USE 1 SPRAY IN EACH NOSTRIL TWICE A DAY. 23 g  11  . latanoprost (XALATAN) 0.005 % ophthalmic solution Place 1 drop into both eyes at bedtime.     . meloxicam (MOBIC) 15 MG tablet TAKE 1 TABLET EACH DAY. 90 tablet 3  . Multiple Vitamins-Minerals (MULTIPLE VITAMINS/WOMENS) tablet Take 2 tablets by mouth daily.    . Omega-3 Fatty Acids (FISH OIL) 1000 MG CAPS Take 2 capsules by mouth 2 (two) times daily.    Marland Kitchen omeprazole (PRILOSEC) 20 MG capsule Take 20 mg by mouth daily as needed.     . timolol (TIMOPTIC-XR) 0.5 % ophthalmic gel-forming     . timolol (TIMOPTIC-XR) 0.5 % ophthalmic gel-forming     . vitamin E 400 UNIT capsule Take 400 Units by mouth daily.     No current facility-administered medications on file prior to visit.    Review of Systems  Constitutional: Negative for unusual diaphoresis or night sweats HENT:  Negative for ear swelling or discharge Eyes: Negative for worsening visual haziness  Respiratory: Negative for choking and stridor.   Gastrointestinal: Negative for distension or worsening eructation Genitourinary: Negative for retention or change in urine volume.  Musculoskeletal: Negative for other MSK pain or swelling Skin: Negative for color change and worsening wound Neurological: Negative for tremors and numbness other than noted  Psychiatric/Behavioral: Negative for decreased concentration or agitation other than above       Objective:   Physical Exam BP 118/72   Pulse 70   Temp 98.7 F (37.1 C) (Oral)   Resp 20   Wt 147 lb (66.7 kg)   SpO2 95%   BMI 26.89 kg/m  VS noted,  Constitutional: Pt appears in no apparent distress HENT: Head: NCAT.  Right Ear: External ear normal.  Left Ear: External ear normal.  Eyes: . Pupils are equal, round, and reactive to light. Conjunctivae and EOM are normal Neck: Normal range of motion. Neck supple.  Cardiovascular: Normal rate and regular rhythm.   Pulmonary/Chest: Effort normal and breath sounds without rales or wheezing.  Abd:  Soft, NT, ND, + BS Neurological: Pt is alert. Not confused , motor grossly intact Skin: Skin is warm. No rash, no LE edema Psychiatric: Pt behavior is normal. No agitation.   Lab Results  Component Value Date   WBC 7.3 01/08/2016   HGB 13.4 01/08/2016   HCT 40.1 01/08/2016   PLT 281.0 01/08/2016   GLUCOSE 104 (H) 01/08/2016   CHOL 222 (H) 10/30/2015   TRIG 154.0 (H) 10/30/2015   HDL 80.90 10/30/2015   LDLDIRECT 97.0 11/10/2012   LDLCALC 111 (H) 10/30/2015   ALT 46 (H) 01/08/2016   AST 42 (H) 01/08/2016   NA 142 01/08/2016   K 4.3 01/08/2016   CL 107 01/08/2016   CREATININE 0.88 01/08/2016   BUN 14 01/08/2016   CO2 27 01/08/2016   TSH 1.50 10/30/2015   INR 0.91 10/06/2011       Assessment & Plan:

## 2016-01-30 ENCOUNTER — Encounter: Payer: Self-pay | Admitting: Internal Medicine

## 2016-01-30 LAB — GLIADIN ANTIBODIES, SERUM
Gliadin IgA: 15 Units (ref <20)
Gliadin IgG: 3 Units (ref <20)

## 2016-01-30 LAB — TISSUE TRANSGLUTAMINASE, IGA: Tissue Transglutaminase Ab, IgA: 1 U/mL (ref <4)

## 2016-01-31 ENCOUNTER — Ambulatory Visit (INDEPENDENT_AMBULATORY_CARE_PROVIDER_SITE_OTHER): Payer: Medicare Other | Admitting: Gynecology

## 2016-01-31 ENCOUNTER — Encounter: Payer: Self-pay | Admitting: Gynecology

## 2016-01-31 VITALS — BP 122/74 | Ht 61.0 in | Wt 144.0 lb

## 2016-01-31 DIAGNOSIS — N952 Postmenopausal atrophic vaginitis: Secondary | ICD-10-CM

## 2016-01-31 DIAGNOSIS — N8189 Other female genital prolapse: Secondary | ICD-10-CM

## 2016-01-31 DIAGNOSIS — Z01419 Encounter for gynecological examination (general) (routine) without abnormal findings: Secondary | ICD-10-CM | POA: Diagnosis not present

## 2016-01-31 DIAGNOSIS — M810 Age-related osteoporosis without current pathological fracture: Secondary | ICD-10-CM | POA: Diagnosis not present

## 2016-01-31 LAB — RETICULIN ANTIBODIES, IGA W TITER: Reticulin Ab, IgA: NEGATIVE

## 2016-01-31 LAB — HM DEXA SCAN

## 2016-01-31 NOTE — Patient Instructions (Signed)

## 2016-01-31 NOTE — Progress Notes (Signed)
    Leah [name] 05-01-1930 [id number]        82 y.o.  G2P2002  for breast and pelvic exam.  Past medical history,surgical history, problem list, medications, allergies, family history and social history were all reviewed and documented as reviewed in the EPIC chart.  ROS:  Performed with pertinent positives and negatives included in the history, assessment and plan.   Additional significant findings :  None   Exam: Caryn Bee assistant Vitals:   01/31/16 1044  BP: 122/74  Weight: 144 lb (65.3 kg)  Height: 5\' 1"  (1.549 m)   Body mass index is 27.21 kg/m.  General appearance:  Normal affect, orientation and appearance. Skin: Grossly normal HEENT: Without gross lesions.  No cervical or supraclavicular adenopathy. Thyroid normal.  Lungs:  Clear without wheezing, rales or rhonchi Cardiac: RR, without RMG Abdominal:  Soft, nontender, without masses, guarding, rebound, organomegaly or hernia Breasts:  Examined lying and sitting without masses, retractions, discharge or axillary adenopathy. Pelvic:  Ext/BUS/Vagina with atrophic changes. Second degree cystocele. First to second-degree uterine prolapse. First-degree rectocele  Cervix atrophic  Uterus anteverted, normal size, shape and contour, midline and mobile nontender   Adnexa without masses or tenderness    Anus and perineum normal   Rectovaginal normal sphincter tone without palpated masses or tenderness.    Assessment/Plan:  82 y.o. [name] female for breast and pelvic exam.   1. Postmenopausal/atrophic genital changes. No significant hot flushes, night sweats, vaginal dryness or any vaginal bleeding. Continue to monitor report any issues or bleeding. 2. Pelvic relaxation. Cystocele/uterine prolapse/mild rectocele. Stable over serial exams. Asymptomatic to the patient. We'll continue to monitor. 3. Osteoporosis. DEXA 2015. T score -2.5 at forearm. Stable in the spine. History of bisphosphonates 2003 through 2011.  Currently on drug-free holiday. Repeat DEXA now and patient will schedule. 4. Pap smear 2011. No Pap smear done today. No history of significant abnormal Pap smears.  Reviewed current screening guidelines based on age we will plan on stop screening and she is comfortable with this. 5. Colonoscopy 2009. Currently being evaluated for chronic diarrhea with planned referral to gastroenterologist. She'll follow up with them in reference to this. 6. Mammography due now and I reminded patient to schedule and she agrees to do so. 7. Health maintenance. No routine lab work done as this is done elsewhere. Follow up 1 year, sooner as needed.  Anastasio Auerbach MD, 11:22 AM 01/31/2016

## 2016-02-05 ENCOUNTER — Encounter: Payer: Self-pay | Admitting: Gastroenterology

## 2016-03-07 ENCOUNTER — Encounter: Payer: Self-pay | Admitting: Gynecology

## 2016-03-19 ENCOUNTER — Encounter (INDEPENDENT_AMBULATORY_CARE_PROVIDER_SITE_OTHER): Payer: Self-pay

## 2016-03-19 ENCOUNTER — Encounter: Payer: Self-pay | Admitting: Gastroenterology

## 2016-03-19 ENCOUNTER — Ambulatory Visit (INDEPENDENT_AMBULATORY_CARE_PROVIDER_SITE_OTHER): Payer: Medicare Other | Admitting: Gastroenterology

## 2016-03-19 ENCOUNTER — Other Ambulatory Visit (INDEPENDENT_AMBULATORY_CARE_PROVIDER_SITE_OTHER): Payer: Medicare Other

## 2016-03-19 VITALS — BP 126/76 | HR 74 | Ht 62.0 in | Wt 141.4 lb

## 2016-03-19 DIAGNOSIS — R195 Other fecal abnormalities: Secondary | ICD-10-CM

## 2016-03-19 DIAGNOSIS — R945 Abnormal results of liver function studies: Principal | ICD-10-CM

## 2016-03-19 DIAGNOSIS — R7989 Other specified abnormal findings of blood chemistry: Secondary | ICD-10-CM

## 2016-03-19 DIAGNOSIS — R197 Diarrhea, unspecified: Secondary | ICD-10-CM

## 2016-03-19 LAB — HEPATIC FUNCTION PANEL
ALK PHOS: 72 U/L (ref 39–117)
ALT: 18 U/L (ref 0–35)
AST: 24 U/L (ref 0–37)
Albumin: 4.2 g/dL (ref 3.5–5.2)
BILIRUBIN DIRECT: 0.1 mg/dL (ref 0.0–0.3)
Total Bilirubin: 0.6 mg/dL (ref 0.2–1.2)
Total Protein: 7.3 g/dL (ref 6.0–8.3)

## 2016-03-19 NOTE — Patient Instructions (Signed)
Your physician has requested that you go to the basement for the following lab work before leaving today: LFT's.   Thank you for choosing me and [facility].  Malcolm T. Stark, Jr., MD., FACG   

## 2016-03-19 NOTE — Progress Notes (Signed)
    History of Present Illness: This is an 82 year old female referred by Biagio Borg, MD for the evaluation of diarrhea. Fecal lactoferrin was positive, celiac panel negative, GI pathogen panel was negative. ESR=12, TSH normal, AST=42, ALT=46, alk phos=132. Her diarrhea began suddenly and was described as loose watery, nonbloody and urgent. Her diarrhea subsided after several weeks and she has had normal bowel movements and felt well for the past month. She had a flexible sigmoidoscopy performed by Dr. Benay Pillow in 2003 which was apparently normal. She states she had a colonoscopy performed somewhere else many years before the sigmoidoscopy. Denies weight loss, abdominal pain, constipation, diarrhea, change in stool caliber, melena, hematochezia, nausea, vomiting, dysphagia, reflux symptoms, chest pain.  Review of Systems: Pertinent positive and negative review of systems were noted in the above HPI section. All other review of systems were otherwise negative.  Current Medications, Allergies, Past Medical History, Past Surgical History, Family History and Social History were reviewed in [employer] record.  Physical Exam: General: Well developed, well nourished, no acute distress Head: Normocephalic and atraumatic Eyes:  sclerae anicteric, EOMI Ears: Normal auditory acuity Mouth: No deformity or lesions Neck: Supple, no masses or thyromegaly Lungs: Clear throughout to auscultation Heart: Regular rate and rhythm; no murmurs, rubs or bruits Abdomen: Soft, non tender and non distended. No masses, hepatosplenomegaly or hernias noted. Normal Bowel sounds Musculoskeletal: Symmetrical with no gross deformities  Skin: No lesions on visible extremities Pulses:  Normal pulses noted Extremities: No clubbing, cyanosis, edema or deformities noted Neurological: Alert oriented x 4, grossly nonfocal Cervical Nodes:  No significant cervical adenopathy Inguinal Nodes: No  significant inguinal adenopathy Psychological:  Alert and cooperative. Normal mood and affect  Assessment and Recommendations:  1. Diarrhea, resolved. Self-limited process likely infectious and then post infectious diarrhea. Gradually add lactose products, raw fruits, raw vegetables and high fat foods back into her diet as she desires. She is advised to contact us if her symptoms recur.  2. Mildly elevated LFTs. Etiology unclear but may be reactive to her acute diarrheal illness. LFTs were normal several months prior. Repeat LFTs today and if improving continue to trend. If they remain persistently elevated would recommend further evaluation.   cc: Biagio Borg, MD [address], [place] [address]

## 2016-03-24 ENCOUNTER — Other Ambulatory Visit: Payer: Self-pay | Admitting: Internal Medicine

## 2016-03-24 ENCOUNTER — Ambulatory Visit (INDEPENDENT_AMBULATORY_CARE_PROVIDER_SITE_OTHER): Payer: Medicare Other

## 2016-03-24 DIAGNOSIS — M810 Age-related osteoporosis without current pathological fracture: Secondary | ICD-10-CM

## 2016-03-25 ENCOUNTER — Telehealth: Payer: Self-pay | Admitting: Gynecology

## 2016-03-25 NOTE — Telephone Encounter (Signed)
Pt informed with the below note. Pt said she will call back to decide.

## 2016-03-25 NOTE — Telephone Encounter (Signed)
Tell patient that her recent bone density shows that her spine is stable from prior studies. Her forearm did lose some calcium. The question is whether we should treat her with medication due to the loss of calcium in her forearm. If she would like to discuss this and we will make a decision then recommend office visit.

## 2016-08-04 ENCOUNTER — Ambulatory Visit (INDEPENDENT_AMBULATORY_CARE_PROVIDER_SITE_OTHER): Payer: Medicare Other | Admitting: Gastroenterology

## 2016-08-04 ENCOUNTER — Encounter: Payer: Self-pay | Admitting: Gastroenterology

## 2016-08-04 VITALS — BP 130/76 | HR 76 | Ht 62.0 in | Wt 139.0 lb

## 2016-08-04 DIAGNOSIS — R197 Diarrhea, unspecified: Secondary | ICD-10-CM

## 2016-08-04 DIAGNOSIS — R7989 Other specified abnormal findings of blood chemistry: Secondary | ICD-10-CM | POA: Diagnosis not present

## 2016-08-04 DIAGNOSIS — R945 Abnormal results of liver function studies: Secondary | ICD-10-CM

## 2016-08-04 NOTE — Patient Instructions (Signed)
You can start a probiotic over the counter such as Align or Restora daily x 1 week.   You can take over the counter Imodium three times a day long term for you diarrhea symptoms.   Thank you for choosing me and [facility].  [name]. [name]., MD., [name]

## 2016-08-04 NOTE — Progress Notes (Signed)
    History of Present Illness: This is an 83 year old female returning for follow-up of diarrhea and elevated LFTs. Repeat liver function tests performed in October 2017 were normal. Diarrhea had resolved at the time of her visit in October where she now notes intermittent diarrhea that seems to worsen with certain foods and stress. She states she is under a great deal of stress due to her husband's chronic problems with congestive heart failure. She states that her stools are not as well formed as they used to be and this pattern has been persistent since last fall.    Current Medications, Allergies, Past Medical History, Past Surgical History, Family History and Social History were reviewed in [employer] record.  Physical Exam: General: Well developed, well nourished, no acute distress Head: Normocephalic and atraumatic Eyes:  sclerae anicteric, EOMI Ears: Normal auditory acuity Mouth: No deformity or lesions Lungs: Clear throughout to auscultation Heart: Regular rate and rhythm; no murmurs, rubs or bruits Abdomen: Soft, non tender and non distended. No masses, hepatosplenomegaly or hernias noted. Normal Bowel sounds Musculoskeletal: Symmetrical with no gross deformities  Pulses:  Normal pulses noted Extremities: No clubbing, cyanosis, edema or deformities noted Neurological: Alert oriented x 4, grossly nonfocal Psychological:  Alert and cooperative. Anxious.   Assessment and Recommendations:  1. Diarrhea, likely postinfectious with a long-term impact on bowel function. Avoid foods that trigger symptoms. Follow-up with PCP on stress and anxiety management. Imodium 3 times a day when necessary. Trial of 3 or 4 different probiotics for 1-2 weeks each. Consider a course of antibiotics for possible SIBO. Pt advised to call us with progress update in 1 month.   2. Elevated LFTs, normalized.

## 2016-09-03 ENCOUNTER — Other Ambulatory Visit: Payer: Self-pay | Admitting: Internal Medicine

## 2016-11-28 ENCOUNTER — Telehealth: Payer: Self-pay

## 2016-11-28 NOTE — Telephone Encounter (Signed)
Routing to dr Ronnald Ramp, patient has diagnosis of osteoporosis and tscore of 2.5---are you ok with starting prolia if patient agrees---please advise, I will call patient to discuss, thanks

## 2016-11-30 NOTE — Telephone Encounter (Signed)
yes

## 2016-12-01 NOTE — Telephone Encounter (Signed)
Patients insurance will be verified and I will call patient with summary of benefits to discuss starting prolia

## 2016-12-19 ENCOUNTER — Telehealth: Payer: Self-pay

## 2016-12-19 NOTE — Telephone Encounter (Signed)
Called and talked briefly about prolia with patient at appx 8:30am, she had to leave, so we agreed I would call her back after lunch---I called at 2:20 and patient did not answer, no way to leave voicemail, no answering machine recording---I will try again either later today or on monday

## 2016-12-22 NOTE — Telephone Encounter (Signed)
Talked with patient further to answer prolia questions---insurance has been verified for prolia, estimated $90 copay due for each injection---patient will think about it and call back to schedule nurse visit if she decides to do it---can talk with tamara if any further questions---this will be patient's first injection, can schedule anytime at her earliest convenience

## 2016-12-25 ENCOUNTER — Ambulatory Visit (INDEPENDENT_AMBULATORY_CARE_PROVIDER_SITE_OTHER): Payer: Medicare Other | Admitting: General Practice

## 2016-12-25 DIAGNOSIS — M810 Age-related osteoporosis without current pathological fracture: Secondary | ICD-10-CM | POA: Diagnosis not present

## 2016-12-25 MED ORDER — DENOSUMAB 60 MG/ML ~~LOC~~ SOLN
60.0000 mg | Freq: Once | SUBCUTANEOUS | Status: AC
  Administered 2016-12-25: 60 mg via SUBCUTANEOUS

## 2017-02-12 ENCOUNTER — Encounter: Payer: Medicare Other | Admitting: Gynecology

## 2017-02-28 ENCOUNTER — Other Ambulatory Visit: Payer: Self-pay | Admitting: Internal Medicine

## 2017-03-11 ENCOUNTER — Encounter: Payer: Self-pay | Admitting: Gynecology

## 2017-03-12 ENCOUNTER — Ambulatory Visit (INDEPENDENT_AMBULATORY_CARE_PROVIDER_SITE_OTHER): Payer: Medicare Other | Admitting: Gynecology

## 2017-03-12 ENCOUNTER — Encounter: Payer: Self-pay | Admitting: Gynecology

## 2017-03-12 VITALS — BP 120/76 | Ht 61.0 in | Wt 141.0 lb

## 2017-03-12 DIAGNOSIS — N8189 Other female genital prolapse: Secondary | ICD-10-CM

## 2017-03-12 DIAGNOSIS — N952 Postmenopausal atrophic vaginitis: Secondary | ICD-10-CM

## 2017-03-12 DIAGNOSIS — Z01411 Encounter for gynecological examination (general) (routine) with abnormal findings: Secondary | ICD-10-CM

## 2017-03-12 DIAGNOSIS — Z23 Encounter for immunization: Secondary | ICD-10-CM | POA: Diagnosis not present

## 2017-03-12 DIAGNOSIS — M810 Age-related osteoporosis without current pathological fracture: Secondary | ICD-10-CM | POA: Diagnosis not present

## 2017-03-12 NOTE — Progress Notes (Signed)
    Leah [name] [birth year] [id number]        83 y.o.  G2P2002 for annual gynecologic exam.    Past medical history,surgical history, problem list, medications, allergies, family history and social history were all reviewed and documented as reviewed in the EPIC chart.  ROS:  Performed with pertinent positives and negatives included in the history, assessment and plan.   Additional significant findings :  None   Exam: Caryn Bee assistant Vitals:   03/12/17 1012  BP: 120/76  Weight: 141 lb (64 kg)  Height: 5\' 1"  (1.549 m)   Body mass index is 26.64 kg/m.  General appearance:  Normal affect, orientation and appearance. Skin: Grossly normal HEENT: Without gross lesions.  No cervical or supraclavicular adenopathy. Thyroid normal.  Lungs:  Clear without wheezing, rales or rhonchi Cardiac: RR, without RMG Abdominal:  Soft, nontender, without masses, guarding, rebound, organomegaly or hernia Breasts:  Examined lying and sitting without masses, retractions, discharge or axillary adenopathy. Pelvic:  Ext, BUS, Vagina: With atrophic changes. Second-degree cystocele. First to second-degree uterine prolapse. First-degree rectocele  Cervix: With atrophic changes  Uterus: Anteverted, normal size, shape and contour, midline and mobile nontender   Adnexa: Without masses or tenderness    Anus and perineum: Normal   Rectovaginal: Normal sphincter tone without palpated masses or tenderness.    Assessment/Plan:  83 y.o. [id number] female for annual gynecologic exam.  1. Pelvic relaxation with cystocele/rectocele/uterine prolapse. Patient without significant symptoms. I again reviewed issues and options to include pessary trial, surgery or observation. Patient's comfortable with continuing observation at this time. Will follow up if she develops significant symptoms. 2. Postmenopausal/atrophic genital changes. No significant hot flushes, night sweats, vaginal dryness or any vaginal bleeding.  Continue to monitor report any issues or bleeding. 3. Osteoporosis. DEXA 2017 T score -3.0. Was started on Prolia this past year by her primary physician. Had been on bisphosphonates from 2003 through 2011.   She will continue to follow up with them in reference to her Prolia. 4. Pap smear 2011. No Pap smear done today. No history of abnormal Pap smears previously. We both agree to stop screening based on age and current screening guidelines. 5. Mammography 03/2017. Continue with annual mammography next year. Breast exam normal today. 6. Colonoscopy 2009. Will repeat at their recommended interval. 7. Health maintenance. No routine lab work done as patient does this elsewhere. Follow up 1 year, sooner as needed.  Anastasio Auerbach MD, 10:49 AM 03/12/2017

## 2017-03-12 NOTE — Patient Instructions (Signed)
Follow-up in 1 year for annual exam.  Sooner if any issues 

## 2017-04-09 DIAGNOSIS — R0981 Nasal congestion: Secondary | ICD-10-CM | POA: Insufficient documentation

## 2017-04-09 DIAGNOSIS — J3489 Other specified disorders of nose and nasal sinuses: Secondary | ICD-10-CM | POA: Insufficient documentation

## 2017-05-08 ENCOUNTER — Other Ambulatory Visit: Payer: Self-pay | Admitting: Internal Medicine

## 2017-05-13 ENCOUNTER — Telehealth: Payer: Self-pay | Admitting: Internal Medicine

## 2017-05-13 NOTE — Telephone Encounter (Signed)
Copied from [place] [id number]. Topic: Quick Communication - See Telephone Encounter >> May 13, 2017  2:03 PM Burnis Medin, NT wrote: CRM for notification. See Telephone encounter for: Pt is calling to get a refill on DYMISTA 137-50 MCG/ACT. Pt uses [facility].   05/13/17.

## 2017-05-14 ENCOUNTER — Telehealth: Payer: Self-pay | Admitting: Internal Medicine

## 2017-05-14 NOTE — Telephone Encounter (Signed)
Pt requesting refill on dymista; original prescription written in 2016; does pt need an office visit before refill can be granted? This a pt of Dr Scarlette Calico?

## 2017-05-14 NOTE — Telephone Encounter (Signed)
Copied from [place]. Topic: Quick Communication - See Telephone Encounter >> May 14, 2017  4:01 PM Burnis Medin, NT wrote: CRM for notification. See Telephone encounter for: Pt. Is calling to see if she can get a refill for  meloxicam (MOBIC) 15 MG tablet. Pt uses [facility].  05/14/17.

## 2017-05-14 NOTE — Telephone Encounter (Signed)
Yes patient will need a OV. Yes Dr.Jones is the PCP.

## 2017-05-14 NOTE — Telephone Encounter (Signed)
Attempted to contact pt regarding request for refill on dymista; per office note an outpatient visit is required before refill can be granted; unable to leave message at [phone]

## 2017-05-15 NOTE — Telephone Encounter (Signed)
Attempted to contact pt regarding request for dymista; per office note an appointment is required; unable to leave message at [phone]

## 2017-05-15 NOTE — Telephone Encounter (Signed)
Called patient to advise she will need an OV before medication can be filled.  NO ANSWER / LOV 01/29/16 with DR. Kandee Keen / Patient needs to schedule office visit

## 2017-05-21 ENCOUNTER — Encounter: Payer: Self-pay | Admitting: Internal Medicine

## 2017-05-21 ENCOUNTER — Ambulatory Visit: Payer: Medicare Other | Admitting: Internal Medicine

## 2017-05-21 VITALS — BP 138/78 | HR 80 | Temp 99.0°F | Resp 16 | Ht 61.0 in | Wt 147.8 lb

## 2017-05-21 DIAGNOSIS — J301 Allergic rhinitis due to pollen: Secondary | ICD-10-CM

## 2017-05-21 DIAGNOSIS — M150 Primary generalized (osteo)arthritis: Secondary | ICD-10-CM

## 2017-05-21 DIAGNOSIS — M159 Polyosteoarthritis, unspecified: Secondary | ICD-10-CM

## 2017-05-21 DIAGNOSIS — C4441 Basal cell carcinoma of skin of scalp and neck: Secondary | ICD-10-CM | POA: Insufficient documentation

## 2017-05-21 MED ORDER — MELOXICAM 15 MG PO TABS: 15.0000 mg | ORAL_TABLET | Freq: Every day | ORAL | 0 refills | Status: DC

## 2017-05-21 MED ORDER — AZELASTINE-FLUTICASONE 137-50 MCG/ACT NA SUSP: 1.0000 | Freq: Two times a day (BID) | NASAL | 11 refills | Status: DC

## 2017-05-21 NOTE — Progress Notes (Signed)
Subjective:  Patient ID: Leah [name], female    DOB: January 14, 1930  Age: 83 y.o. MRN: [id number]  CC: Rash   HPI AVERILL WINTERS presents for concerns about a skin lesion on the left side of her neck.  The area has been bothering her for about a month.  She describes it as an irritated, scaly/scabby area.  Outpatient Medications Prior to Visit  Medication Sig Dispense Refill  . acetaminophen (TYLENOL) 500 MG tablet Take 1,000 mg by mouth every 6 (six) hours as needed.    . Ascorbic Acid (VITAMIN C) 1000 MG tablet Take 1,000 mg by mouth daily.    . cholecalciferol (VITAMIN D) 1000 UNITS tablet Take 1,000 Units by mouth daily.    Marland Kitchen denosumab (PROLIA) 60 MG/ML SOLN injection Inject 60 mg into the skin every 6 (six) months. Administer in upper arm, thigh, or abdomen    . latanoprost (XALATAN) 0.005 % ophthalmic solution Place 1 drop into both eyes at bedtime.     . Multiple Vitamins-Minerals (MULTIPLE VITAMINS/WOMENS) tablet Take 2 tablets by mouth daily.    . Omega-3 Fatty Acids (FISH OIL) 1000 MG CAPS Take 2 capsules by mouth 2 (two) times daily.    . timolol (TIMOPTIC-XR) 0.5 % ophthalmic gel-forming     . vitamin E 400 UNIT capsule Take 400 Units by mouth daily.    Marland Kitchen DYMISTA 137-50 MCG/ACT SUSP USE 1 SPRAY IN EACH NOSTRIL TWICE A DAY. 23 g 11  . meloxicam (MOBIC) 15 MG tablet TAKE 1 TABLET EACH DAY. 90 tablet 0   No facility-administered medications prior to visit.     ROS Review of Systems  Constitutional: Negative.   HENT: Positive for congestion and rhinorrhea. Negative for facial swelling, sinus pressure, sinus pain, sore throat and trouble swallowing.   Eyes: Negative.   Respiratory: Negative for cough, chest tightness, shortness of breath and wheezing.   Cardiovascular: Negative for chest pain, palpitations and leg swelling.  Gastrointestinal: Negative for abdominal pain, diarrhea and nausea.  Endocrine: Negative.   Genitourinary: Negative.   Musculoskeletal:  Positive for arthralgias.  Skin: Negative.   Neurological: Negative.   Hematological: Negative for adenopathy. Does not bruise/bleed easily.  Psychiatric/Behavioral: Negative.   All other systems reviewed and are negative.   Objective:  BP 138/78 (BP Location: Left Arm, Patient Position: Sitting, Cuff Size: Normal)   Pulse 80   Temp 99 F (37.2 C) (Oral)   Resp 16   Ht 5\' 1"  (1.549 m)   Wt 147 lb 12 oz (67 kg)   SpO2 99%   BMI 27.92 kg/m   BP Readings from Last 3 Encounters:  05/21/17 138/78  03/12/17 120/76  08/04/16 130/76    Wt Readings from Last 3 Encounters:  05/21/17 147 lb 12 oz (67 kg)  03/12/17 141 lb (64 kg)  08/04/16 139 lb (63 kg)    Physical Exam  Constitutional: She is oriented to person, place, and time. No distress.  HENT:  Mouth/Throat: Oropharynx is clear and moist. No oropharyngeal exudate.  Eyes: Conjunctivae are normal. Left eye exhibits no discharge. No scleral icterus.  Neck: Normal range of motion. Neck supple. No JVD present. No thyromegaly present.    Cardiovascular: Normal rate, regular rhythm and normal heart sounds.  No murmur heard. Pulmonary/Chest: Effort normal and breath sounds normal. No respiratory distress. She has no rales. She exhibits no tenderness.  Abdominal: Soft. Bowel sounds are normal. She exhibits no distension and no mass. There is no guarding.  Musculoskeletal: Normal range of motion. She exhibits no edema or tenderness.  Lymphadenopathy:    She has no cervical adenopathy.  Neurological: She is alert and oriented to person, place, and time.  Skin: Skin is warm and dry. No rash noted. She is not diaphoretic. No erythema. No pallor.    Lab Results  Component Value Date   WBC 7.3 01/08/2016   HGB 13.4 01/08/2016   HCT 40.1 01/08/2016   PLT 281.0 01/08/2016   GLUCOSE 104 (H) 01/08/2016   CHOL 222 (H) 10/30/2015   TRIG 154.0 (H) 10/30/2015   HDL 80.90 10/30/2015   LDLDIRECT 97.0 11/10/2012   LDLCALC 111 (H)  10/30/2015   ALT 18 03/19/2016   AST 24 03/19/2016   NA 142 01/08/2016   K 4.3 01/08/2016   CL 107 01/08/2016   CREATININE 0.88 01/08/2016   BUN 14 01/08/2016   CO2 27 01/08/2016   TSH 1.50 10/30/2015   INR 0.91 10/06/2011    No results found.  Assessment & Plan:   Ioanna was seen today for rash.  Diagnoses and all orders for this visit:  Primary osteoarthritis involving multiple joints  Seasonal allergic rhinitis due to pollen  Basal cell carcinoma (BCC) of neck -     Ambulatory referral to Dermatology  Other orders -     Azelastine-Fluticasone (DYMISTA) 137-50 MCG/ACT SUSP; Place 1 spray into both nostrils 2 (two) times daily. -     meloxicam (MOBIC) 15 MG tablet; Take 1 tablet (15 mg total) by mouth daily.   I have changed Gevena Barre. Mangas's DYMISTA to Azelastine-Fluticasone. I have also changed her meloxicam. I am also having her maintain her latanoprost, acetaminophen, MULTIPLE VITAMINS/WOMENS, Fish Oil, cholecalciferol, vitamin C, vitamin E, timolol, and denosumab.  Meds ordered this encounter  Medications  . Azelastine-Fluticasone (DYMISTA) 137-50 MCG/ACT SUSP    Sig: Place 1 spray into both nostrils 2 (two) times daily.    Dispense:  23 g    Refill:  11  . meloxicam (MOBIC) 15 MG tablet    Sig: Take 1 tablet (15 mg total) by mouth daily.    Dispense:  90 tablet    Refill:  0     Follow-up: Return if symptoms worsen or fail to improve.  Scarlette Calico, MD

## 2017-05-21 NOTE — Patient Instructions (Signed)
Basal Cell Carcinoma Basal cell carcinoma is the most common form of skin cancer. It begins in the basal cells, which are at the bottom of the outer skin layer (epidermis). It occurs most often on parts of the body that are frequently exposed to the sun, such as:  Parts of the head, including the scalp or face.  Ears.  Neck.  Arms or legs.  Backs of the hands.  Basal cell carcinoma can almost always be cured. It rarely spreads to other areas of the body (metastasizes). Basal cell carcinoma may come back at the same location (recur), but it can be treated again if this occurs. What are the causes? This condition is usually caused by exposure to ultraviolet (UV) light. UV light may come from the sun or from tanning beds. Other causes include:  Exposure to arsenic.  Exposure to radiation.  Exposure to toxic tars and oils.  Certain genetic conditions, such as xeroderma pigmentosum.  What increases the risk? This condition is more likely to develop in:  People who are older than 81 years of age.  People who have fair skin (light complexion).  People who have blonde or red hair.  People who have blue, green, or gray eyes.  People who have childhood freckling.  People who have had sun exposure over long periods of time, especially during childhood.  People who have had repeated sunburns.  People who have a weakened immune system.  People who have been exposed to certain chemicals, such as tar, soot, and arsenic.  People who have chronic inflammatory conditions.  People who have chronic infections.  People who use tanning beds.  What are the signs or symptoms? The main symptom of this condition is a growth or lesion on the skin. The shape and color of the growth or lesion may vary. The five main types include:  An open sore that may remain open for 3 weeks or longer. The sore may bleed or crust. This type of lesion can be an early sign of basal cell carcinoma. Basal  cell carcinoma often shows up as a sore that does not heal.  A reddish area that may crust, itch, or cause discomfort. This may occur on areas that are exposed to the sun. These patches might be easier to feel than to see.  A shiny or clear bump that is red, white, or pink. In people who have dark hair, the bump is often tan, black, or brown. These bumps can look like moles.  A pink growth with a raised border. The growth will have a crusted and indented area in the center. Small blood vessels may appear on the surface of the growth as it gets bigger.  A scarlike area that looks like shiny, stretched skin. The area may be white, yellow, or waxy. It often has irregular borders. This may be a sign of more aggressive basal cell carcinoma.  How is this diagnosed? This condition may be diagnosed with:  A physical exam.  Removal of a tissue sample to be examined under a microscope (biopsy).  How is this treated? Treatment for this condition involves removing the cancerous tissue. The method that is used for this depends on the type, size, location, and number of tumors. Possible treatments include:  Mohs surgery. In this procedure, the cancerous skin cells are removed layer by layer until all of the tumor has been removed.  Surgical removal (excision) of the tumor. This involves removing the entire tumor and a small amount of   normal skin that surrounds it.  Cryosurgery. This involves freezing the tumor with liquid nitrogen.  Plastic surgery. The tumor is removed, and healthy skin from another part of the body is used to cover the wound. This may be done for large tumors that are in areas where it is not possible to stretch the nearby skin to sew the edges of the wound together.  Radiation. This may be used for tumors on the face.  Photodynamic therapy. A chemical cream is applied to the skin, and light exposure is used to activate the chemical.  Electrodesiccation and curettage. This  involves alternately scraping and burning the tumor while using an electric current to control bleeding.  Chemical treatments, such as imiquimod cream and interferon injections. These may be used to remove superficial tumors with minimal scarring.  Follow these instructions at home:  Avoid unprotected sun exposure.  Do self-exams as told by your health care provider. Look for new spots or changes in your skin.  Keep all follow-up visits as told by your health care provider. This is important. How is this prevented?  Avoid the sun when it is the strongest. This is usually between 10:00 a.m. and 4:00 p.m.  When you are out in the sun, use a sunscreen that has a sun protection factor (SPF) of at least 30.  Apply sunscreen at least 30 minutes before exposure to the sun.  Reapply sunscreen every 2-4 hours while you are outside. Also reapply it after swimming and after excessive sweating.  Always wear hats, protective clothing, and UV-blocking sunglasses when you are outdoors.  Do not use tanning beds. Contact a health care provider if:  You notice any new spots or any changes in your skin.  You have had a basal cell carcinoma tumor removed and you notice a new growth in the same location. This information is not intended to replace advice given to you by your health care provider. Make sure you discuss any questions you have with your health care provider. Document Released: 11/23/2002 Document Revised: 10/25/2015 Document Reviewed: 09/11/2014 Elsevier Interactive Patient Education  2018 [employer]  

## 2017-07-06 ENCOUNTER — Ambulatory Visit: Payer: Medicare Other

## 2017-07-06 ENCOUNTER — Ambulatory Visit (INDEPENDENT_AMBULATORY_CARE_PROVIDER_SITE_OTHER): Payer: Medicare Other

## 2017-07-06 DIAGNOSIS — M810 Age-related osteoporosis without current pathological fracture: Secondary | ICD-10-CM

## 2017-07-06 MED ORDER — DENOSUMAB 60 MG/ML ~~LOC~~ SOLN
60.0000 mg | Freq: Once | SUBCUTANEOUS | Status: AC
  Administered 2017-07-06: 60 mg via SUBCUTANEOUS

## 2017-08-04 ENCOUNTER — Other Ambulatory Visit: Payer: Self-pay | Admitting: Internal Medicine

## 2017-08-10 DIAGNOSIS — M25511 Pain in right shoulder: Secondary | ICD-10-CM | POA: Insufficient documentation

## 2017-11-04 ENCOUNTER — Other Ambulatory Visit: Payer: Self-pay | Admitting: Internal Medicine

## 2017-11-16 ENCOUNTER — Telehealth: Payer: Self-pay | Admitting: Emergency Medicine

## 2017-11-16 NOTE — Telephone Encounter (Signed)
Called patient to schedule AWV. Patient will call back at later date to schedule. 

## 2018-01-15 ENCOUNTER — Ambulatory Visit: Payer: Self-pay | Admitting: Internal Medicine

## 2018-01-15 ENCOUNTER — Ambulatory Visit: Payer: Medicare Other | Admitting: Internal Medicine

## 2018-01-15 ENCOUNTER — Encounter: Payer: Self-pay | Admitting: Internal Medicine

## 2018-01-15 VITALS — BP 152/84 | HR 72 | Temp 97.9°F | Ht 61.0 in | Wt 145.0 lb

## 2018-01-15 DIAGNOSIS — R030 Elevated blood-pressure reading, without diagnosis of hypertension: Secondary | ICD-10-CM

## 2018-01-15 DIAGNOSIS — I10 Essential (primary) hypertension: Secondary | ICD-10-CM | POA: Insufficient documentation

## 2018-01-15 NOTE — Telephone Encounter (Signed)
Pt called to c/o BP 176/86 and 174/84. Pt denies any symptoms except a broken blood vessel to her left eye. BP was taken by a trainer at her [employer]. Pt stated that she felt good but became worried when she learned her BP was elevated and with the left eye broken blood vessel. Pt denies any headache, chest pain, blurred vision, difficulty breathing or weakness.  Care advice given to pt and pt verbalized understanding. Pt given appointment today with Dr Sharlet Salina at 1:20 pm. Pt's PCP not available today. Reason for Disposition . Systolic BP  >= 790 OR Diastolic >= 383  Answer Assessment - Initial Assessment Questions 1. BLOOD PRESSURE: "What is the blood pressure?" "Did you take at least two measurements 5 minutes apart?" 174/84 176/86 2. ONSET: "When did you take your blood pressure?"   1045 3. 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 HISTORY: "Do you have a history of high blood pressure?"     no 5. MEDICATIONS: "Are you taking any medications for blood pressure?" "Have you missed any doses recently?"     no 6. OTHER SYMPTOMS: "Do you have any symptoms?" (e.g., headache, chest pain, blurred vision, difficulty breathing, weakness) Broken blood vessel left eye 50% medial eye  7. PREGNANCY: "Is there any chance you are pregnant?" "When was your last menstrual period?"     n/a  Protocols used: HIGH BLOOD PRESSURE-A-AH

## 2018-01-15 NOTE — Patient Instructions (Signed)
There was no change in the EKG since 2016. I think we should check the blood pressure again mid to late next week to see how it is doing.

## 2018-01-15 NOTE — Progress Notes (Signed)
   Subjective:    Patient ID: Leah [name], female    DOB: 1929/11/30, 84 y.o.   MRN: [id number]  HPI The patient is an 84 YO female coming in for elevated blood pressure. Her husband is part of paramedics program and they come once a week to check on him and fill pillbox. She asked them to take her pressure and it was 160/90s then had it checked at the gym and it was slightly higher in the 170s/86. She denies feeling sick in the last several days. Denies chest pains or SOB. Denies headaches or migraines. Denies feeling sick. Does not take otcs other than vitamins. Denies ibuprofen/aleve/motrin. Denies stomach pain, nausea or vomiting, diarrhea or constipation.   PMH, [facility], social history reviewed and updated.   Review of Systems  Constitutional: Negative.   HENT: Negative.   Eyes: Negative.   Respiratory: Negative for cough, chest tightness and shortness of breath.   Cardiovascular: Negative for chest pain, palpitations and leg swelling.  Gastrointestinal: Negative for abdominal distention, abdominal pain, constipation, diarrhea, nausea and vomiting.  Musculoskeletal: Negative.   Skin: Negative.   Neurological: Negative.   Psychiatric/Behavioral: Negative.       Objective:   Physical Exam  Constitutional: She is oriented to person, place, and time. She appears well-developed and well-nourished.  HENT:  Head: Normocephalic and atraumatic.  Eyes: EOM are normal.  Neck: Normal range of motion.  Cardiovascular: Normal rate and regular rhythm.  Murmur noted  Pulmonary/Chest: Effort normal and breath sounds normal. No respiratory distress. She has no wheezes. She has no rales.  Abdominal: Soft. Bowel sounds are normal. She exhibits no distension. There is no tenderness. There is no rebound.  Musculoskeletal: She exhibits no edema.  Neurological: She is alert and oriented to person, place, and time. Coordination normal.  Skin: Skin is warm and dry.  Psychiatric: She has a normal  mood and affect.   Vitals:   01/15/18 1314  BP: (!) 162/90  Pulse: 72  Temp: 97.9 F (36.6 C)  TempSrc: Oral  SpO2: 98%  Weight: 145 lb (65.8 kg)  Height: 5\' 1"  (1.549 m)   EKG: Rate 69, axis normal, intervals normal, sinus, LBBB, ST elevation in V1, V2, V4, V5 which are not new from prior EKG 2016. There is no significant change since 2016.     Assessment & Plan:

## 2018-01-15 NOTE — Assessment & Plan Note (Signed)
EKG stable from prior. No change to diet or lifestyle. Will have her make nursing visit to recheck blood pressure next week. No symptoms to suggest hypertensive urgency or emergency.

## 2018-01-20 ENCOUNTER — Telehealth: Payer: Self-pay

## 2018-01-20 ENCOUNTER — Ambulatory Visit: Payer: Medicare Other

## 2018-01-20 NOTE — Telephone Encounter (Signed)
Patient saw dr Sharlet Salina a few weeks back for acute episode of hypertension, and dr crawford asked patient to return to recheck bp today---today's reading was 152/84----patient is currently not taking any kind of bp meds, please advise if you want to continue watching this number or what instructions you have for patient to proceed with care, routing to dr crawford, please advise, I will call patient back, thanks

## 2018-01-21 ENCOUNTER — Telehealth: Payer: Self-pay

## 2018-01-21 NOTE — Telephone Encounter (Signed)
Routing to scheduling, can you please schedule appt with crawford, dr Ronnald Ramp is ok with patient transferring to dr crawford

## 2018-01-21 NOTE — Telephone Encounter (Signed)
Fine

## 2018-01-21 NOTE — Telephone Encounter (Signed)
Okay to monitor and follow up with Dr. Ronnald Ramp in 1 month or so.

## 2018-01-21 NOTE — Telephone Encounter (Signed)
Patient advised of dr crawfords instructions, she will continue to monitor bp for the next several weeks and follow up if needed

## 2018-01-21 NOTE — Telephone Encounter (Signed)
Patient was very pleased with the visit she had with dr Sharlet Salina, really prefers a female doctor and she likes how thorough dr Sharlet Salina was with her, she is requesting to switch providers from dr. Ronnald Ramp to dr crawford, routing to dr crawford, please advise, I will call patient back (she is due for yearly and wants cholesterol checked at that visit if possible).  thanks

## 2018-01-22 NOTE — Telephone Encounter (Signed)
Apt scheduled.  

## 2018-01-24 ENCOUNTER — Other Ambulatory Visit: Payer: Self-pay | Admitting: Internal Medicine

## 2018-02-03 ENCOUNTER — Encounter: Payer: Self-pay | Admitting: Internal Medicine

## 2018-02-03 ENCOUNTER — Ambulatory Visit: Payer: Medicare Other | Admitting: Internal Medicine

## 2018-02-03 ENCOUNTER — Other Ambulatory Visit (INDEPENDENT_AMBULATORY_CARE_PROVIDER_SITE_OTHER): Payer: Medicare Other

## 2018-02-03 VITALS — BP 136/80 | HR 67 | Temp 97.5°F | Ht 61.0 in | Wt 144.0 lb

## 2018-02-03 DIAGNOSIS — Z13220 Encounter for screening for lipoid disorders: Secondary | ICD-10-CM

## 2018-02-03 DIAGNOSIS — E785 Hyperlipidemia, unspecified: Secondary | ICD-10-CM | POA: Diagnosis not present

## 2018-02-03 DIAGNOSIS — K219 Gastro-esophageal reflux disease without esophagitis: Secondary | ICD-10-CM

## 2018-02-03 DIAGNOSIS — Z0000 Encounter for general adult medical examination without abnormal findings: Secondary | ICD-10-CM

## 2018-02-03 DIAGNOSIS — M150 Primary generalized (osteo)arthritis: Secondary | ICD-10-CM

## 2018-02-03 DIAGNOSIS — M159 Polyosteoarthritis, unspecified: Secondary | ICD-10-CM

## 2018-02-03 DIAGNOSIS — Z23 Encounter for immunization: Secondary | ICD-10-CM

## 2018-02-03 DIAGNOSIS — M810 Age-related osteoporosis without current pathological fracture: Secondary | ICD-10-CM

## 2018-02-03 LAB — COMPREHENSIVE METABOLIC PANEL
ALBUMIN: 4 g/dL (ref 3.5–5.2)
ALK PHOS: 48 U/L (ref 39–117)
ALT: 15 U/L (ref 0–35)
AST: 21 U/L (ref 0–37)
BILIRUBIN TOTAL: 0.6 mg/dL (ref 0.2–1.2)
BUN: 20 mg/dL (ref 6–23)
CALCIUM: 9.8 mg/dL (ref 8.4–10.5)
CO2: 31 meq/L (ref 19–32)
Chloride: 107 mEq/L (ref 96–112)
Creatinine, Ser: 0.96 mg/dL (ref 0.40–1.20)
GFR: 58.61 mL/min — AB (ref >60.00)
Glucose, Bld: 99 mg/dL (ref 70–99)
Potassium: 3.9 mEq/L (ref 3.5–5.1)
Sodium: 144 mEq/L (ref 135–145)
Total Protein: 6.8 g/dL (ref 6.0–8.3)

## 2018-02-03 LAB — CBC
HCT: 38.6 % (ref 36.0–46.0)
HEMOGLOBIN: 13.2 g/dL (ref 12.0–15.0)
MCHC: 34.1 g/dL (ref 30.0–36.0)
MCV: 89.1 fl (ref 78.0–100.0)
PLATELETS: 208 10*3/uL (ref 150.0–400.0)
RBC: 4.34 Mil/uL (ref 3.87–5.11)
RDW: 14.5 % (ref 11.5–15.5)
WBC: 5.4 10*3/uL (ref 4.0–10.5)

## 2018-02-03 LAB — LIPID PANEL
CHOL/HDL RATIO: 3
CHOLESTEROL: 200 mg/dL (ref 0–200)
HDL: 78.3 mg/dL (ref >39.00)
LDL Cholesterol: 100 mg/dL — ABNORMAL HIGH (ref 0–99)
NonHDL: 121.76
TRIGLYCERIDES: 110 mg/dL (ref 0.0–149.0)
VLDL: 22 mg/dL (ref 0.0–40.0)

## 2018-02-03 MED ORDER — DENOSUMAB 60 MG/ML ~~LOC~~ SOSY
60.0000 mg | PREFILLED_SYRINGE | Freq: Once | SUBCUTANEOUS | Status: AC
  Administered 2018-02-03: 60 mg via SUBCUTANEOUS

## 2018-02-03 NOTE — Patient Instructions (Signed)
Think about getting the shingles vaccine called shingrix to reduce your risk of getting shingles again.   We have given you the flu shot today.   Health Maintenance, Female Adopting a healthy lifestyle and getting preventive care can go a long way to promote health and wellness. Talk with your health care provider about what schedule of regular examinations is right for you. This is a good chance for you to check in with your provider about disease prevention and staying healthy. In between checkups, there are plenty of things you can do on your own. Experts have done a lot of research about which lifestyle changes and preventive measures are most likely to keep you healthy. Ask your health care provider for more information. Weight and diet Eat a healthy diet  Be sure to include plenty of vegetables, fruits, low-fat dairy products, and lean protein.  Do not eat a lot of foods high in solid fats, added sugars, or salt.  Get regular exercise. This is one of the most important things you can do for your health. ? Most adults should exercise for at least 150 minutes each week. The exercise should increase your heart rate and make you sweat (moderate-intensity exercise). ? Most adults should also do strengthening exercises at least twice a week. This is in addition to the moderate-intensity exercise.  Maintain a healthy weight  Body mass index (BMI) is a measurement that can be used to identify possible weight problems. It estimates body fat based on height and weight. Your health care provider can help determine your BMI and help you achieve or maintain a healthy weight.  For females 7 years of age and older: ? A BMI below 18.5 is considered underweight. ? A BMI of 18.5 to 24.9 is normal. ? A BMI of 25 to 29.9 is considered overweight. ? A BMI of 30 and above is considered obese.  Watch levels of cholesterol and blood lipids  You should start having your blood tested for lipids and  cholesterol at 82 years of age, then have this test every 5 years.  You may need to have your cholesterol levels checked more often if: ? Your lipid or cholesterol levels are high. ? You are older than 82 years of age. ? You are at high risk for heart disease.  Cancer screening Lung Cancer  Lung cancer screening is recommended for adults 57-13 years old who are at high risk for lung cancer because of a history of smoking.  A yearly low-dose CT scan of the lungs is recommended for people who: ? Currently smoke. ? Have quit within the past 15 years. ? Have at least a 30-pack-year history of smoking. A pack year is smoking an average of one pack of cigarettes a day for 1 year.  Yearly screening should continue until it has been 15 years since you quit.  Yearly screening should stop if you develop a health problem that would prevent you from having lung cancer treatment.  Breast Cancer  Practice breast self-awareness. This means understanding how your breasts normally appear and feel.  It also means doing regular breast self-exams. Let your health care provider know about any changes, no matter how small.  If you are in your 20s or 30s, you should have a clinical breast exam (CBE) by a health care provider every 1-3 years as part of a regular health exam.  If you are 19 or older, have a CBE every year. Also consider having a breast X-ray (mammogram)  every year.  If you have a family history of breast cancer, talk to your health care provider about genetic screening.  If you are at high risk for breast cancer, talk to your health care provider about having an MRI and a mammogram every year.  Breast cancer gene (BRCA) assessment is recommended for women who have family members with BRCA-related cancers. BRCA-related cancers include: ? Breast. ? Ovarian. ? Tubal. ? Peritoneal cancers.  Results of the assessment will determine the need for genetic counseling and BRCA1 and BRCA2  testing.  Cervical Cancer Your health care provider may recommend that you be screened regularly for cancer of the pelvic organs (ovaries, uterus, and vagina). This screening involves a pelvic examination, including checking for microscopic changes to the surface of your cervix (Pap test). You may be encouraged to have this screening done every 3 years, beginning at age 57.  For women ages 18-65, health care providers may recommend pelvic exams and Pap testing every 3 years, or they may recommend the Pap and pelvic exam, combined with testing for human papilloma virus (HPV), every 5 years. Some types of HPV increase your risk of cervical cancer. Testing for HPV may also be done on women of any age with unclear Pap test results.  Other health care providers may not recommend any screening for nonpregnant women who are considered low risk for pelvic cancer and who do not have symptoms. Ask your health care provider if a screening pelvic exam is right for you.  If you have had past treatment for cervical cancer or a condition that could lead to cancer, you need Pap tests and screening for cancer for at least 20 years after your treatment. If Pap tests have been discontinued, your risk factors (such as having a new sexual partner) need to be reassessed to determine if screening should resume. Some women have medical problems that increase the chance of getting cervical cancer. In these cases, your health care provider may recommend more frequent screening and Pap tests.  Colorectal Cancer  This type of cancer can be detected and often prevented.  Routine colorectal cancer screening usually begins at 82 years of age and continues through 82 years of age.  Your health care provider may recommend screening at an earlier age if you have risk factors for colon cancer.  Your health care provider may also recommend using home test kits to check for hidden blood in the stool.  A small camera at the end of a  tube can be used to examine your colon directly (sigmoidoscopy or colonoscopy). This is done to check for the earliest forms of colorectal cancer.  Routine screening usually begins at age 67.  Direct examination of the colon should be repeated every 5-10 years through 82 years of age. However, you may need to be screened more often if early forms of precancerous polyps or small growths are found.  Skin Cancer  Check your skin from head to toe regularly.  Tell your health care provider about any new moles or changes in moles, especially if there is a change in a mole's shape or color.  Also tell your health care provider if you have a mole that is larger than the size of a pencil eraser.  Always use sunscreen. Apply sunscreen liberally and repeatedly throughout the day.  Protect yourself by wearing long sleeves, pants, a wide-brimmed hat, and sunglasses whenever you are outside.  Heart disease, diabetes, and high blood pressure  High blood pressure causes  heart disease and increases the risk of stroke. High blood pressure is more likely to develop in: ? People who have blood pressure in the high end of the normal range (130-139/85-89 mm Hg). ? People who are overweight or obese. ? People who are African American.  If you are 78-68 years of age, have your blood pressure checked every 3-5 years. If you are 70 years of age or older, have your blood pressure checked every year. You should have your blood pressure measured twice-once when you are at a hospital or clinic, and once when you are not at a hospital or clinic. Record the average of the two measurements. To check your blood pressure when you are not at a hospital or clinic, you can use: ? An automated blood pressure machine at a pharmacy. ? A home blood pressure monitor.  If you are between 22 years and 73 years old, ask your health care provider if you should take aspirin to prevent strokes.  Have regular diabetes screenings. This  involves taking a blood sample to check your fasting blood sugar level. ? If you are at a normal weight and have a low risk for diabetes, have this test once every three years after 82 years of age. ? If you are overweight and have a high risk for diabetes, consider being tested at a younger age or more often. Preventing infection Hepatitis B  If you have a higher risk for hepatitis B, you should be screened for this virus. You are considered at high risk for hepatitis B if: ? You were born in a country where hepatitis B is common. Ask your health care provider which countries are considered high risk. ? Your parents were born in a high-risk country, and you have not been immunized against hepatitis B (hepatitis B vaccine). ? You have HIV or AIDS. ? You use needles to inject street drugs. ? You live with someone who has hepatitis B. ? You have had sex with someone who has hepatitis B. ? You get hemodialysis treatment. ? You take certain medicines for conditions, including cancer, organ transplantation, and autoimmune conditions.  Hepatitis C  Blood testing is recommended for: ? Everyone born from [birth year] through 1963. ? Anyone with known risk factors for hepatitis C.  Sexually transmitted infections (STIs)  You should be screened for sexually transmitted infections (STIs) including gonorrhea and chlamydia if: ? You are sexually active and are younger than 82 years of age. ? You are older than 82 years of age and your health care provider tells you that you are at risk for this type of infection. ? Your sexual activity has changed since you were last screened and you are at an increased risk for chlamydia or gonorrhea. Ask your health care provider if you are at risk.  If you do not have HIV, but are at risk, it may be recommended that you take a prescription medicine daily to prevent HIV infection. This is called pre-exposure prophylaxis (PrEP). You are considered at risk if: ? You are  sexually active and do not regularly use condoms or know the HIV status of your partner(s). ? You take drugs by injection. ? You are sexually active with a partner who has HIV.  Talk with your health care provider about whether you are at high risk of being infected with HIV. If you choose to begin PrEP, you should first be tested for HIV. You should then be tested every 3 months for as long as  you are taking PrEP. Pregnancy  If you are premenopausal and you may become pregnant, ask your health care provider about preconception counseling.  If you may become pregnant, take 400 to 800 micrograms (mcg) of folic acid every day.  If you want to prevent pregnancy, talk to your health care provider about birth control (contraception). Osteoporosis and menopause  Osteoporosis is a disease in which the bones lose minerals and strength with aging. This can result in serious bone fractures. Your risk for osteoporosis can be identified using a bone density scan.  If you are 53 years of age or older, or if you are at risk for osteoporosis and fractures, ask your health care provider if you should be screened.  Ask your health care provider whether you should take a calcium or vitamin D supplement to lower your risk for osteoporosis.  Menopause may have certain physical symptoms and risks.  Hormone replacement therapy may reduce some of these symptoms and risks. Talk to your health care provider about whether hormone replacement therapy is right for you. Follow these instructions at home:  Schedule regular health, dental, and eye exams.  Stay current with your immunizations.  Do not use any tobacco products including cigarettes, chewing tobacco, or electronic cigarettes.  If you are pregnant, do not drink alcohol.  If you are breastfeeding, limit how much and how often you drink alcohol.  Limit alcohol intake to no more than 1 drink per day for nonpregnant women. One drink equals 12 ounces of  beer, 5 ounces of wine, or 1 ounces of hard liquor.  Do not use street drugs.  Do not share needles.  Ask your health care provider for help if you need support or information about quitting drugs.  Tell your health care provider if you often feel depressed.  Tell your health care provider if you have ever been abused or do not feel safe at home. This information is not intended to replace advice given to you by your health care provider. Make sure you discuss any questions you have with your health care provider. Document Released: 12/02/2010 Document Revised: 10/25/2015 Document Reviewed: 02/20/2015 Elsevier Interactive Patient Education  [employer].

## 2018-02-03 NOTE — Progress Notes (Signed)
   Subjective:    Patient ID: Leah [name], female    DOB: 1929-07-20, 84 y.o.   MRN: [id number]  HPI Here for medicare wellness and physical and transfer to new provider, no new complaints. Please see A/P for status and treatment of chronic medical problems.   Diet: heart healthy Physical activity: active, trainer twice a week Depression/mood screen: negative Hearing: intact to whispered voice Visual acuity: grossly normal, performs annual eye exam  ADLs: capable Fall risk: none Home safety: good Cognitive evaluation: intact to orientation, naming, recall and repetition EOL planning: adv directives discussed  I have personally reviewed and have noted 1. The patient's medical and social history - reviewed today no changes 2. Their use of alcohol, tobacco or illicit drugs 3. Their current medications and supplements 4. The patient's functional ability including ADL's, fall risks, home safety risks and hearing or visual impairment. 5. Diet and physical activities 6. Evidence for depression or mood disorders 7. Care team reviewed and updated (available in snapshot)  Review of Systems  Constitutional: Negative.   HENT: Negative.   Eyes: Negative.   Respiratory: Negative for cough, chest tightness and shortness of breath.   Cardiovascular: Negative for chest pain, palpitations and leg swelling.  Gastrointestinal: Negative for abdominal distention, abdominal pain, constipation, diarrhea, nausea and vomiting.  Musculoskeletal: Negative.   Skin: Negative.   Neurological: Negative.   Psychiatric/Behavioral: Negative.       Objective:   Physical Exam  Constitutional: She is oriented to person, place, and time. She appears well-developed and well-nourished.  HENT:  Head: Normocephalic and atraumatic.  Eyes: EOM are normal.  Neck: Normal range of motion.  Cardiovascular: Normal rate and regular rhythm.  Pulmonary/Chest: Effort normal and breath sounds normal. No respiratory  distress. She has no wheezes. She has no rales.  Abdominal: Soft. Bowel sounds are normal. She exhibits no distension. There is no tenderness. There is no rebound.  Musculoskeletal: She exhibits no edema.  Neurological: She is alert and oriented to person, place, and time. Coordination normal.  Skin: Skin is warm and dry.  Psychiatric: She has a normal mood and affect.   Vitals:   02/03/18 0903 02/03/18 0949  BP: (!) 150/80 136/80  Pulse: 67   Temp: (!) 97.5 F (36.4 C)   TempSrc: Oral   SpO2: 97%   Weight: 144 lb (65.3 kg)   Height: 5\' 1"  (1.549 m)       Assessment & Plan:  Prolia given at visit, Flu shot given at visit

## 2018-02-04 ENCOUNTER — Ambulatory Visit: Payer: Self-pay

## 2018-02-04 ENCOUNTER — Encounter (HOSPITAL_COMMUNITY): Payer: Self-pay | Admitting: Obstetrics and Gynecology

## 2018-02-04 ENCOUNTER — Other Ambulatory Visit: Payer: Self-pay

## 2018-02-04 ENCOUNTER — Emergency Department (HOSPITAL_COMMUNITY)
Admission: EM | Admit: 2018-02-04 | Discharge: 2018-02-04 | Disposition: A | Discharge status: Home / Self Care | Payer: Medicare Other | Attending: Emergency Medicine | Admitting: Emergency Medicine

## 2018-02-04 DIAGNOSIS — Z5321 Procedure and treatment not carried out due to patient leaving prior to being seen by health care provider: Secondary | ICD-10-CM | POA: Insufficient documentation

## 2018-02-04 DIAGNOSIS — R030 Elevated blood-pressure reading, without diagnosis of hypertension: Secondary | ICD-10-CM | POA: Insufficient documentation

## 2018-02-04 NOTE — Assessment & Plan Note (Signed)
Flu shot given at visit. Pneumonia counseled. Shingrix counseled. Tetanus up to date. Colonoscopy aged out. Mammogram aged out, pap smear aged out and dexa due this year. Counseled about sun safety and mole surveillance. Counseled about the dangers of distracted driving. Given 10 year screening recommendations.

## 2018-02-04 NOTE — Telephone Encounter (Signed)
Incoming call from patient with a complaint of elevated blood Pressure.  States was there caring for her husband.  She asked him to take her blood pressure with a manual cuff. It was 180/100 approximately 15 minutes apart.  Patient states it was 150/ 80 and 135/80 in the office when she went to see Dr. Sharlet Salina yesterday.  She received two shots yesterday: the flu shot an Prolia. Wonders if they caused the elevation.  Yesterday  patient states she had no energy " just felt louzy" States she feels better , but not her complete self.   Per protocol recommended patient go to the ED.  Provided care advice. Patient voiced understanding.  Will call a neighbor to take her to [facility]. Patient wanted me to relate phone numbers where she may be reached if Dr. Sharlet Salina need to reach her.   (m) [phone] (h) [phone].   Reason for Disposition . [8] Systolic BP  >= 657 OR Diastolic >= 846 AND [9] cardiac or neurologic symptoms (e.g., chest pain, difficulty breathing, unsteady gait, blurred vision)  Answer Assessment - Initial Assessment Questions 1. BLOOD PRESSURE: "What is the blood pressure?" "Did you take at least two measurements 5 minutes apart?"     180/100  180/100 2. ONSET: "When did you take your blood pressure?"     Started today  3. HOW: "How did you obtain the blood pressure?" (e.g., visiting nurse, automatic home BP monitor)    Parametic. manual4. HISTORY: "Do you have a history of high blood pressure?"     no5. MEDICATIONS: "Are you taking any medications for blood pressure?" "Have you missed any doses recently?"     no 6. OTHER SYMPTOMS: "Do you have any symptoms?" (e.g., headache, chest pain, blurred vision, difficulty breathing, weakness)     No yesterday no energy light headed  7. PREGNANCY: "Is there any chance you are pregnant?" "When was your last menstrual period?"     na  Protocols used: HIGH BLOOD PRESSURE-A-AH

## 2018-02-04 NOTE — Assessment & Plan Note (Signed)
Taking meloxicam daily which helps her pain.

## 2018-02-04 NOTE — Assessment & Plan Note (Signed)
Rare, uses otc when needed.

## 2018-02-04 NOTE — Assessment & Plan Note (Signed)
Checking lipid panel today, not on meds, adjust as needed.

## 2018-02-04 NOTE — Assessment & Plan Note (Signed)
Given prolia at visit, dexa due this year and will get with gyn (only 1 year on prolia at that time so may not show full benefit).

## 2018-02-04 NOTE — ED Triage Notes (Signed)
Pt reports she is coming from home with a BP of 180/100 taken at home. Pt reports yesterday morning at 9am it was 150/80 at the doctors office. Pt reports it has been trending upward since august.

## 2018-02-05 ENCOUNTER — Ambulatory Visit: Payer: Medicare Other | Admitting: Family

## 2018-02-05 ENCOUNTER — Encounter: Payer: Self-pay | Admitting: Family

## 2018-02-05 VITALS — BP 156/83 | HR 71 | Temp 97.4°F | Ht 61.0 in | Wt 145.1 lb

## 2018-02-05 DIAGNOSIS — R030 Elevated blood-pressure reading, without diagnosis of hypertension: Secondary | ICD-10-CM

## 2018-02-05 NOTE — Progress Notes (Signed)
Leah [name] is a 84 y.o. female with the following history as recorded in EpicCare:  Patient Active Problem List   Diagnosis Date Noted  . Elevated blood pressure reading in office without diagnosis of hypertension 01/15/2018  . Basal cell carcinoma (BCC) of neck 05/21/2017  . IBS (irritable bowel syndrome) 01/29/2016  . Routine general medical examination at a health care facility 11/01/2015  . Left bundle branch block (LBBB) on electrocardiogram 10/04/2014  . DJD (degenerative joint disease) 09/09/2013  . Dupuytren's contracture of both hands 03/04/2013  . GLAUCOMA STAGE UNSPECIFIED 05/16/2010  . Allergic rhinitis 07/05/2009  . GERD 09/14/2008  . Hyperlipidemia with target LDL less than 130 02/18/2007  . Osteoporosis 02/15/2007    Current Outpatient Medications  Medication Sig Dispense Refill  . acetaminophen (TYLENOL) 500 MG tablet Take 1,000 mg by mouth every 6 (six) hours as needed.    . Ascorbic Acid (VITAMIN C) 1000 MG tablet Take 1,000 mg by mouth daily.    . Azelastine-Fluticasone (DYMISTA) 137-50 MCG/ACT SUSP Place 1 spray into both nostrils 2 (two) times daily. 23 g 11  . cholecalciferol (VITAMIN D) 1000 UNITS tablet Take 1,000 Units by mouth daily.    Marland Kitchen denosumab (PROLIA) 60 MG/ML SOLN injection Inject 60 mg into the skin every 6 (six) months. Administer in upper arm, thigh, or abdomen    . glucosamine-chondroitin 500-400 MG tablet Take 1 tablet by mouth 3 (three) times daily.    Marland Kitchen latanoprost (XALATAN) 0.005 % ophthalmic solution Place 1 drop into both eyes at bedtime.     . meloxicam (MOBIC) 15 MG tablet TAKE 1 TABLET EACH DAY. 90 tablet 0  . Multiple Vitamins-Minerals (MULTIPLE VITAMINS/WOMENS) tablet Take 2 tablets by mouth daily.    . Omega-3 Fatty Acids (FISH OIL) 1000 MG CAPS Take 2 capsules by mouth 2 (two) times daily.    . Probiotic Product (ALIGN PO) Take by mouth.    . timolol (TIMOPTIC-XR) 0.5 % ophthalmic gel-forming     . vitamin E 400 UNIT capsule  Take 400 Units by mouth daily.     No current facility-administered medications for this visit.     Allergies: Sulfa antibiotics; Nabumetone; Naproxen sodium; and Tramadol hcl  Past Medical History:  Diagnosis Date  . Acute maxillary sinusitis   . Basal cell carcinoma of nose   . Bundle branch block, unspecified    EKG 4/13 with clearance and note that isnt new block Dr Arnoldo Morale on chart  . Carpal tunnel syndrome   . Cystocele   . Detrusor instability   . GERD (gastroesophageal reflux disease)   . Glaucoma   . Hyperlipidemia   . Localized osteoarthrosis not specified whether primary or secondary, lower leg   . Osteoporosis 01/2014   T score -2.5 UD forarm, stable at the spine recommend 2 year follow up DEXA  . PONV (postoperative nausea and vomiting)    19 yrs ago- states has had general anesthesia since and did well  . Unspecified adverse effect of unspecified drug, medicinal and biological substance   . Uterine prolapse     Past Surgical History:  Procedure Laterality Date  . arthroscopic r knee    . CARPAL TUNNEL RELEASE  2011  . DILATION AND CURETTAGE OF UTERUS  1982  . EYE SURGERY     bilateral cataract extraction  with IOL  . FLEXIBLE SIGMOIDOSCOPY    . HYSTEROSCOPY    . HYSTEROSCOPY W/D&C  2005 and 2008  . JOINT REPLACEMENT  bilateral total hip arthroplasty  . total hip rerplacement bilateral    . TOTAL KNEE ARTHROPLASTY  10/13/2011   Procedure: TOTAL KNEE ARTHROPLASTY;  Surgeon: Gearlean Alf, MD;  Location: [facility];  Service: Orthopedics;  Laterality: Right;    Family History  Problem Relation Age of Onset  . Dementia Mother   . Breast cancer Mother 28  . Heart disease Father   . Stroke Father   . Dementia Sister     Social History   Tobacco Use  . Smoking status: Former Smoker    Last attempt to quit: 10/06/1958    Years since quitting: 59.3  . Smokeless tobacco: Never Used  Substance Use Topics  . Alcohol use: Yes    Alcohol/week: 1.0 standard  drinks    Types: 1 Glasses of wine per week    Comment: 1 glass wine week    Subjective:  Patient presents with concerns for episodes of elevated blood pressure; saw her PCP on Wednesday and blood pressure was normal at 136/80; had bad reaction to 2 shots given on Wednesday; notes she is feeling better today; has brought her home blood pressure machine in for comparison- home machine seems to be running a little high/ 10 points different on the top/ 5 points on the bottom compared to our cuff here; Denies any chest pain, shortness of breath, blurred vision or headache.   Objective:  Vitals:   02/05/18 1313 02/05/18 1314  BP: (!) 144/78 (!) 156/83  Pulse: 71   Temp: (!) 97.4 F (36.3 C)   TempSrc: Oral   SpO2: 95%   Weight: 145 lb 1.3 oz (65.8 kg)   Height: 5\' 1"  (1.549 m)     General: Well developed, well nourished, in no acute distress  Skin : Warm and dry.  Head: Normocephalic and atraumatic  Lungs: Respirations unlabored; clear to auscultation bilaterally without wheeze, rales, rhonchi  CVS exam: normal rate and regular rhythm.  Neurologic: Alert and oriented; speech intact; face symmetrical; moves all extremities well; CNII-XII intact without focal deficit   Assessment:  1. Elevated blood pressure reading in office without diagnosis of hypertension     Plan:  Reassurance given; based on log that patient brings to the office today, her blood pressure is averaging 136-140/80; do not feel medication is warranted; she will check her blood pressure 2-3 x per week; follow-up if consistently above 150/90;  No follow-ups on file.  No orders of the defined types were placed in this encounter.   Requested Prescriptions    No prescriptions requested or ordered in this encounter

## 2018-03-17 ENCOUNTER — Encounter: Payer: Self-pay | Admitting: Gynecology

## 2018-03-17 ENCOUNTER — Ambulatory Visit: Payer: Medicare Other | Admitting: Gynecology

## 2018-03-17 VITALS — BP 124/74 | Ht 62.0 in | Wt 145.0 lb

## 2018-03-17 DIAGNOSIS — M810 Age-related osteoporosis without current pathological fracture: Secondary | ICD-10-CM | POA: Diagnosis not present

## 2018-03-17 DIAGNOSIS — N8189 Other female genital prolapse: Secondary | ICD-10-CM

## 2018-03-17 DIAGNOSIS — N952 Postmenopausal atrophic vaginitis: Secondary | ICD-10-CM

## 2018-03-17 DIAGNOSIS — Z01419 Encounter for gynecological examination (general) (routine) without abnormal findings: Secondary | ICD-10-CM

## 2018-03-17 NOTE — Patient Instructions (Signed)
Follow-up for the bone density as scheduled. 

## 2018-03-17 NOTE — Progress Notes (Signed)
    ZELENE BARGA Oct 16, 1929 [id number]        84 y.o.  [id number] for annual gynecologic exam.  Without gynecologic complaints.  Past medical history,surgical history, problem list, medications, allergies, family history and social history were all reviewed and documented as reviewed in the EPIC chart.  ROS:  Performed with pertinent positives and negatives included in the history, assessment and plan.   Additional significant findings : None   Exam: Caryn Bee assistant Vitals:   03/17/18 1355  BP: 124/74  Weight: 145 lb (65.8 kg)  Height: 5\' 2"  (1.575 m)   Body mass index is 26.52 kg/m.  General appearance:  Normal affect, orientation and appearance. Skin: Grossly normal HEENT: Without gross lesions.  No cervical or supraclavicular adenopathy. Thyroid normal.  Lungs:  Clear without wheezing, rales or rhonchi Cardiac: RR, without RMG Abdominal:  Soft, nontender, without masses, guarding, rebound, organomegaly or hernia Breasts:  Examined lying and sitting without masses, retractions, discharge or axillary adenopathy. Pelvic:  Ext, BUS, Vagina: With second-degree cystocele.  First-degree uterine prolapse.  First-degree rectocele.  Cervix: With atrophic changes  Uterus: Anteverted, normal size, shape and contour, midline and mobile nontender   Adnexa: Without masses or tenderness    Anus and perineum: Normal   Rectovaginal: Normal sphincter tone without palpated masses or tenderness.    Assessment/Plan:  84 y.o. G12P2002 female for annual gynecologic exam.   1. Postmenopausal/atrophic genital changes.  No significant menopausal symptoms or any bleeding. 2. Pelvic relaxation with cystocele/rectocele/uterine prolapse.  Patient is without significant symptoms.  Stable on serial exams.  Continue with annual exam monitoring.  Follow-up if any symptoms develop. 3. Osteoporosis.  DEXA 2017 T score -3.0.  History of bisphosphate's 2000 08/02/2009.  Started on Prolia last year.   Schedule baseline DEXA now at 2-year interval. 4. Mammography due now and patient is going to call and schedule.  Breast exam normal today. 5. Colonoscopy 2009.  Will follow-up with primary physician's recommendations for ongoing colon screening in 84 year old. 6. Pap smear 2011.  No Pap smear done today.  No history of abnormal Pap smears.  We both agree to stop screening per current screening guidelines based on age. 7. Health maintenance.  No routine lab work done as patient does this elsewhere.  Follow-up 1 year, sooner as needed.   Anastasio Auerbach MD, 2:46 PM 03/17/2018

## 2018-03-29 LAB — HM MAMMOGRAPHY

## 2018-03-31 ENCOUNTER — Encounter: Payer: Self-pay | Admitting: Internal Medicine

## 2018-03-31 NOTE — Progress Notes (Signed)
Abstracted and sent to scan  

## 2018-04-05 ENCOUNTER — Other Ambulatory Visit: Payer: Self-pay | Admitting: Gynecology

## 2018-04-05 ENCOUNTER — Ambulatory Visit (INDEPENDENT_AMBULATORY_CARE_PROVIDER_SITE_OTHER): Payer: Medicare Other

## 2018-04-05 DIAGNOSIS — M810 Age-related osteoporosis without current pathological fracture: Secondary | ICD-10-CM

## 2018-04-07 ENCOUNTER — Encounter: Payer: Self-pay | Admitting: Gynecology

## 2018-04-08 ENCOUNTER — Other Ambulatory Visit: Payer: Self-pay | Admitting: Internal Medicine

## 2018-04-14 DIAGNOSIS — M533 Sacrococcygeal disorders, not elsewhere classified: Secondary | ICD-10-CM | POA: Insufficient documentation

## 2018-07-01 ENCOUNTER — Encounter: Payer: Self-pay | Admitting: Gastroenterology

## 2018-07-01 ENCOUNTER — Encounter (INDEPENDENT_AMBULATORY_CARE_PROVIDER_SITE_OTHER): Payer: Self-pay

## 2018-07-01 ENCOUNTER — Ambulatory Visit: Payer: Medicare Other | Admitting: Gastroenterology

## 2018-07-01 VITALS — BP 142/80 | HR 84 | Ht 60.5 in | Wt 147.4 lb

## 2018-07-01 DIAGNOSIS — R1310 Dysphagia, unspecified: Secondary | ICD-10-CM | POA: Diagnosis not present

## 2018-07-01 NOTE — Patient Instructions (Signed)
You have been scheduled for a Barium Esophogram at [facility] (1st floor of the hospital) on 07/07/18 at 8:30am. Please arrive 15 minutes prior to your appointment for registration. Make certain not to have anything to eat or drink 3 hours prior to your test. If you need to reschedule for any reason, please contact radiology at [phone] to do so. __________________________________________________________________ A barium swallow is an examination that concentrates on views of the esophagus. This tends to be a double contrast exam (barium and two liquids which, when combined, create a gas to distend the wall of the oesophagus) or single contrast (non-ionic iodine based). The study is usually tailored to your symptoms so a good history is essential. Attention is paid during the study to the form, structure and configuration of the esophagus, looking for functional disorders (such as aspiration, dysphagia, achalasia, motility and reflux) EXAMINATION You may be asked to change into a gown, depending on the type of swallow being performed. A radiologist and radiographer will perform the procedure. The radiologist will advise you of the type of contrast selected for your procedure and direct you during the exam. You will be asked to stand, sit or lie in several different positions and to hold a small amount of fluid in your mouth before being asked to swallow while the imaging is performed .In some instances you may be asked to swallow barium coated marshmallows to assess the motility of a solid food bolus. The exam can be recorded as a digital or video fluoroscopy procedure. POST PROCEDURE It will take 1-2 days for the barium to pass through your system. To facilitate this, it is important, unless otherwise directed, to increase your fluids for the next 24-48hrs and to resume your normal diet.  This test typically takes about 30 minutes to  perform. __________________________________________________________________  Leah [name] have been scheduled for an endoscopy. Please follow written instructions given to you at your visit today. If you use inhalers (even only as needed), please bring them with you on the day of your procedure. Your physician has requested that you go to www.startemmi.com and enter the access code given to you at your visit today. This web site gives a general overview about your procedure. However, you should still follow specific instructions given to you by our office regarding your preparation for the procedure.  Thank you for choosing me and [facility].  [name]. [name]., MD., [name]

## 2018-07-01 NOTE — Progress Notes (Signed)
    History of Present Illness: This is an 85 year old female with dysphagia.  She relates a several month history of intermittent difficulty swallowing large pills and solid foods.  She has the sensation that liquids go down slowly as well but not as difficult as solids.  She relates very infrequent episodes of regurgitation after meals may be occurring once every few months.  No other gastrointestinal complaints. Denies weight loss, abdominal pain, constipation, diarrhea, change in stool caliber, melena, hematochezia, nausea, vomiting, chest pain.  Current Medications, Allergies, Past Medical History, Past Surgical History, Family History and Social History were reviewed in [employer] record.  Physical Exam: General: Well developed, well nourished, no acute distress Head: Normocephalic and atraumatic Eyes:  sclerae anicteric, EOMI Ears: Normal auditory acuity Mouth: No deformity or lesions Lungs: Clear throughout to auscultation Heart: Regular rate and rhythm; no murmurs, rubs or bruits Abdomen: Soft, non tender and non distended. No masses, hepatosplenomegaly or hernias noted. Normal Bowel sounds Rectal: Not done Musculoskeletal: Symmetrical with no gross deformities  Pulses:  Normal pulses noted Extremities: No clubbing, cyanosis, edema or deformities noted Neurological: Alert oriented x 4, grossly nonfocal Psychological:  Alert and cooperative. Normal mood and affect   Assessment and Recommendations:  1. Dysphagia.  Rule out stricture, GERD, motility disorder, neoplasm.  Schedule barium esophagram and EGD with possible dilation. The risks (including bleeding, perforation, infection, missed lesions, medication reactions and possible hospitalization or surgery if complications occur), benefits, and alternatives to endoscopy with possible biopsy and possible dilation were discussed with the patient and they consent to proceed.   2. IBS-D. Well controlled on [place].  Continue Align daily as needed.

## 2018-07-05 ENCOUNTER — Telehealth: Payer: Self-pay | Admitting: Gastroenterology

## 2018-07-05 NOTE — Telephone Encounter (Signed)
Is this okay Sir? Thank you.

## 2018-07-05 NOTE — Telephone Encounter (Signed)
Pt is sched for colonoscopy 2.10.20.  Pt is on cephalexin and wanted to know if she can continue this medication.

## 2018-07-05 NOTE — Telephone Encounter (Signed)
Yes, it's ok to be on an antibiotic for colonoscopy

## 2018-07-05 NOTE — Telephone Encounter (Signed)
I called to tell Leah [name] it was okay to take her antibiotics and she only takes them pre-procedure due to her having joint replacements. I have asked Barb Merino, [facility] and she said that is not our policy, if ortho wants her to do so that's fine and then ortho could rx them as well. I called her back and informed her. She verbalized understanding.

## 2018-07-07 ENCOUNTER — Ambulatory Visit (HOSPITAL_COMMUNITY): Admission: RE | Admit: 2018-07-07 | Payer: Medicare Other | Source: Ambulatory Visit

## 2018-07-09 ENCOUNTER — Other Ambulatory Visit: Payer: Self-pay

## 2018-07-09 ENCOUNTER — Ambulatory Visit (HOSPITAL_COMMUNITY)
Admission: RE | Admit: 2018-07-09 | Discharge: 2018-07-09 | Disposition: A | Discharge status: Home / Self Care | Payer: Medicare Other | Source: Ambulatory Visit | Attending: Gastroenterology | Admitting: Gastroenterology

## 2018-07-09 DIAGNOSIS — R1310 Dysphagia, unspecified: Secondary | ICD-10-CM | POA: Insufficient documentation

## 2018-07-09 MED ORDER — PANTOPRAZOLE SODIUM 40 MG PO TBEC: 40.0000 mg | DELAYED_RELEASE_TABLET | Freq: Every day | ORAL | 3 refills | Status: DC

## 2018-07-12 ENCOUNTER — Encounter: Payer: Self-pay | Admitting: Gastroenterology

## 2018-07-12 ENCOUNTER — Ambulatory Visit (AMBULATORY_SURGERY_CENTER): Payer: Medicare Other | Admitting: Gastroenterology

## 2018-07-12 ENCOUNTER — Other Ambulatory Visit: Payer: Self-pay

## 2018-07-12 VITALS — BP 155/78 | HR 64 | Temp 99.5°F | Resp 18 | Ht 60.5 in | Wt 147.0 lb

## 2018-07-12 DIAGNOSIS — K2100 Gastro-esophageal reflux disease with esophagitis, without bleeding: Secondary | ICD-10-CM

## 2018-07-12 DIAGNOSIS — K222 Esophageal obstruction: Secondary | ICD-10-CM

## 2018-07-12 DIAGNOSIS — K3189 Other diseases of stomach and duodenum: Secondary | ICD-10-CM

## 2018-07-12 DIAGNOSIS — R933 Abnormal findings on diagnostic imaging of other parts of digestive tract: Secondary | ICD-10-CM

## 2018-07-12 DIAGNOSIS — R1310 Dysphagia, unspecified: Secondary | ICD-10-CM

## 2018-07-12 DIAGNOSIS — K449 Diaphragmatic hernia without obstruction or gangrene: Secondary | ICD-10-CM | POA: Diagnosis not present

## 2018-07-12 MED ORDER — SODIUM CHLORIDE 0.9 % IV SOLN: 500.0000 mL | Freq: Once | INTRAVENOUS | Status: DC

## 2018-07-12 NOTE — Progress Notes (Signed)
A and O x3. Report to RN. Tolerated MAC anesthesia well.Teeth unchanged after procedure.

## 2018-07-12 NOTE — Patient Instructions (Signed)
Liquids for 2 hours, then soft diet the rest of the day, regular food as tolerated tomorrow and beyond.  Repeat EGD in 4-6 weeks for dilation possibly.  YOU HAD AN ENDOSCOPIC PROCEDURE TODAY AT [facility]:   Refer to the procedure report that was given to you for any specific questions about what was found during the examination.  If the procedure report does not answer your questions, please call your gastroenterologist to clarify.  If you requested that your care partner not be given the details of your procedure findings, then the procedure report has been included in a sealed envelope for you to review at your convenience later.  YOU SHOULD EXPECT: Some feelings of bloating in the abdomen. Passage of more gas than usual.  Walking can help get rid of the air that was put into your GI tract during the procedure and reduce the bloating. If you had a lower endoscopy (such as a colonoscopy or flexible sigmoidoscopy) you may notice spotting of blood in your stool or on the toilet paper. If you underwent a bowel prep for your procedure, you may not have a normal bowel movement for a few days.  Please Note:  You might notice some irritation and congestion in your nose or some drainage.  This is from the oxygen used during your procedure.  There is no need for concern and it should clear up in a day or so.  SYMPTOMS TO REPORT IMMEDIATELY:   Following upper endoscopy (EGD)  Vomiting of blood or coffee ground material  New chest pain or pain under the shoulder blades  Painful or persistently difficult swallowing  New shortness of breath  Fever of 100F or higher  Black, tarry-looking stools  For urgent or emergent issues, a gastroenterologist can be reached at any hour by calling [phone].   DIET:  We do recommend a small meal at first, but then you may proceed to your regular diet.  Drink plenty of fluids but you should avoid alcoholic beverages for 24 hours.  ACTIVITY:  You  should plan to take it easy for the rest of today and you should NOT DRIVE or use heavy machinery until tomorrow (because of the sedation medicines used during the test).    FOLLOW UP: Our staff will call the number listed on your records the next business day following your procedure to check on you and address any questions or concerns that you may have regarding the information given to you following your procedure. If we do not reach you, we will leave a message.  However, if you are feeling well and you are not experiencing any problems, there is no need to return our call.  We will assume that you have returned to your regular daily activities without incident.  If any biopsies were taken you will be contacted by phone or by letter within the next 1-3 weeks.  Please call us at [phone] if you have not heard about the biopsies in 3 weeks.    SIGNATURES/CONFIDENTIALITY: You and/or your care partner have signed paperwork which will be entered into your electronic medical record.  These signatures attest to the fact that that the information above on your After Visit Summary has been reviewed and is understood.  Full responsibility of the confidentiality of this discharge information lies with you and/or your care-partner.

## 2018-07-12 NOTE — Op Note (Signed)
[place] Patient Name: Leah [name] Procedure Date: 07/12/2018 10:23 AM MRN: [id number] Endoscopist: Ladene Artist , MD Age: 85 Referring MD:  Date of Birth: 02/19/1930 Gender: Female Account #: [id number] Procedure:                Upper GI endoscopy Indications:              Dysphagia, Abnormal cine-esophagram Medicines:                Monitored Anesthesia Care Procedure:                Pre-Anesthesia Assessment:                           - Prior to the procedure, a History and Physical                            was performed, and patient medications and                            allergies were reviewed. The patient's tolerance of                            previous anesthesia was also reviewed. The risks                            and benefits of the procedure and the sedation                            options and risks were discussed with the patient.                            All questions were answered, and informed consent                            was obtained. Prior Anticoagulants: The patient has                            taken no previous anticoagulant or antiplatelet                            agents. ASA Grade Assessment: II - A patient with                            mild systemic disease. After reviewing the risks                            and benefits, the patient was deemed in                            satisfactory condition to undergo the procedure.                           After obtaining informed consent, the endoscope was  passed under direct vision. Throughout the                            procedure, the patient's blood pressure, pulse, and                            oxygen saturations were monitored continuously. The                            Endoscope was introduced through the mouth, and                            advanced to the second part of duodenum. The upper                            GI endoscopy  was accomplished without difficulty.                            The patient tolerated the procedure well. Scope In: Scope Out: Findings:                 LA Grade D (one or more mucosal breaks involving at                            least 75% of esophageal circumference) esophagitis                            with bleeding was found in the distal esophagus.                           One benign-appearing, intrinsic severe stenosis was                            found 34 cm from the incisors. This stenosis                            measured 9 mm (inner diameter). The stenosis was                            traversed. A guidewire was placed and the scope was                            withdrawn. Dilations were performed with Savary                            dilators with mild resistance and heme noted at 11                            mm, 12 mm and 13 mm.                           The exam of the esophagus was otherwise normal.  One non-bleeding cratered gastric ulcer with no                            stigmata of bleeding was found in the prepyloric                            region of the stomach. The lesion was 6 mm in                            largest dimension. Biopsies were taken with a cold                            forceps for histology.                           A few localized, small non-bleeding erosions were                            found in the prepyloric region of the stomach.                            There were no stigmata of recent bleeding. Biopsies                            were taken with a cold forceps for histology.                           A medium-sized hiatal hernia was present.                           The exam of the stomach was otherwise normal.                           The duodenal bulb and second portion of the                            duodenum were normal. Complications:            No immediate complications. Estimated Blood  Loss:     Estimated blood loss was minimal. Impression:               - LA Grade D reflux esophagitis.                           - Benign-appearing esophageal stenosis. Dilated.                           - Non-bleeding gastric ulcer with no stigmata of                            bleeding. Biopsied.                           - Non-bleeding erosive gastropathy. Biopsied.                           -  Medium-sized hiatal hernia.                           - Normal duodenal bulb and second portion of the                            duodenum. Recommendation:           - Patient has a contact number available for                            emergencies. The signs and symptoms of potential                            delayed complications were discussed with the                            patient. Return to normal activities tomorrow.                            Written discharge instructions were provided to the                            patient.                           - Clear liquid diet for 2 hours, then advance as                            tolerated to soft diet today.                           - Follow close antireflux measures.                           - Continue present medications.                           - Await pathology results.                           - Repeat upper endoscopy in 4 - 6 weeks for repeat                            EGD / dilation. Ladene Artist, MD 07/12/2018 10:51:10 AM This report has been signed electronically.

## 2018-07-12 NOTE — Progress Notes (Signed)
Called to room to assist during endoscopic procedure.  Patient ID and intended procedure confirmed with present staff. Received instructions for my participation in the procedure from the performing physician.  

## 2018-07-13 ENCOUNTER — Telehealth: Payer: Self-pay

## 2018-07-13 NOTE — Telephone Encounter (Signed)
  Follow up Call-  Call back number 07/12/2018  Post procedure Call Back phone  # [phone]  Permission to leave phone message Yes  Some recent data might be hidden     Patient questions:  Do you have a fever, pain , or abdominal swelling? No. Pain Score  0 *  Have you tolerated food without any problems? Yes.    Have you been able to return to your normal activities? Yes.    Do you have any questions about your discharge instructions: Diet   No. Medications  No. Follow up visit  No.  Do you have questions or concerns about your Care? No.  Actions: * If pain score is 4 or above: No action needed, pain <4.

## 2018-07-15 ENCOUNTER — Telehealth: Payer: Self-pay | Admitting: *Deleted

## 2018-07-15 NOTE — Telephone Encounter (Signed)
Appointment made for repeat EGD in 4-6 weeks and PV as well

## 2018-07-16 ENCOUNTER — Ambulatory Visit: Payer: Medicare Other | Admitting: Internal Medicine

## 2018-07-16 ENCOUNTER — Encounter: Payer: Self-pay | Admitting: Internal Medicine

## 2018-07-16 DIAGNOSIS — K2100 Gastro-esophageal reflux disease with esophagitis, without bleeding: Secondary | ICD-10-CM

## 2018-07-16 DIAGNOSIS — M810 Age-related osteoporosis without current pathological fracture: Secondary | ICD-10-CM | POA: Diagnosis not present

## 2018-07-16 NOTE — Patient Instructions (Signed)
You are due for the prolia at the beginning of March.

## 2018-07-16 NOTE — Assessment & Plan Note (Signed)
We discussed prolia and due date for next injection March 4th 2020 or later.

## 2018-07-16 NOTE — Progress Notes (Signed)
   Subjective:   Patient ID: Leah [name], female    DOB: Mar 02, 1930, 85 y.o.   MRN: [id number]  HPI The patient is an 85 YO female coming in for concerns about stomach ulcer. Had an endoscopy and now is on medication. She does have a hiatal hernia. She has modified her diet for GERD diet also. The endoscopy also did a dilation to see if this helps with her dysphagia. She is having less problems with swallowing but not all gone.  Review of Systems  Constitutional: Negative.   HENT: Negative.   Eyes: Negative.   Respiratory: Negative for cough, chest tightness and shortness of breath.   Cardiovascular: Negative for chest pain, palpitations and leg swelling.  Gastrointestinal: Negative for abdominal distention, abdominal pain, constipation, diarrhea, nausea and vomiting.       Dysphagia  Musculoskeletal: Negative.   Skin: Negative.   Neurological: Negative.   Psychiatric/Behavioral: Negative.     Objective:  Physical Exam Constitutional:      Appearance: She is well-developed.  HENT:     Head: Normocephalic and atraumatic.  Neck:     Musculoskeletal: Normal range of motion.  Cardiovascular:     Rate and Rhythm: Normal rate and regular rhythm.  Pulmonary:     Effort: Pulmonary effort is normal. No respiratory distress.     Breath sounds: Normal breath sounds. No wheezing or rales.  Abdominal:     General: Bowel sounds are normal. There is no distension.     Palpations: Abdomen is soft.     Tenderness: There is no abdominal tenderness. There is no rebound.  Skin:    General: Skin is warm and dry.  Neurological:     Mental Status: She is alert and oriented to person, place, and time.     Coordination: Coordination normal.     Vitals:   07/16/18 1342  BP: 140/88  Pulse: 67  Temp: (!) 97.3 F (36.3 C)  TempSrc: Oral  SpO2: 99%  Weight: 147 lb (66.7 kg)  Height: 5' 0.5" (1.537 m)    Assessment & Plan:  Visit time 25 minutes: greater than 50% of that time was  spent in face to face counseling and coordination of care with the patient: counseled about prolia and how this works as well as changes with dental care and GERD and hiatal hernia and how they work.

## 2018-07-16 NOTE — Assessment & Plan Note (Signed)
Taking protonix and getting another endoscopy in about 1 month to ensure resolution of ulcer as well as repeat dilation.

## 2018-07-28 ENCOUNTER — Encounter: Payer: Self-pay | Admitting: Gastroenterology

## 2018-07-30 ENCOUNTER — Encounter: Payer: Self-pay | Admitting: Gastroenterology

## 2018-07-30 ENCOUNTER — Ambulatory Visit (AMBULATORY_SURGERY_CENTER): Payer: Self-pay

## 2018-07-30 DIAGNOSIS — R1310 Dysphagia, unspecified: Secondary | ICD-10-CM

## 2018-07-30 NOTE — Progress Notes (Signed)
Denies allergies to eggs or soy products. Denies complication of anesthesia or sedation. Denies use of weight loss medication. Denies use of O2.   Emmi instructions declined.   Patient arrived for PV on 07/30/18 @ 10:30 Am but had an appointment that she had to get too by 11:00 Am. Patient was in a panic over the amount of time. PV was rescheduled for Monday 08/02/18 @ 10:30 and was told to arrive at 10::15. Patient was happier with re-scheduling.

## 2018-08-02 ENCOUNTER — Encounter: Payer: Self-pay | Admitting: Gastroenterology

## 2018-08-02 ENCOUNTER — Other Ambulatory Visit: Payer: Self-pay

## 2018-08-02 ENCOUNTER — Ambulatory Visit (AMBULATORY_SURGERY_CENTER): Payer: Self-pay | Admitting: *Deleted

## 2018-08-02 VITALS — Ht 61.0 in | Wt 146.0 lb

## 2018-08-02 DIAGNOSIS — R1310 Dysphagia, unspecified: Secondary | ICD-10-CM

## 2018-08-02 NOTE — Progress Notes (Signed)
No egg or soy allergy known to patient  No issues with past sedation with any surgeries  or procedures, no intubation problems  No diet pills per patient No home 02 use per patient  No blood thinners per patient  Pt denies issues with constipation  No A fib or A flutter  EMMI video sent to pt's e mail  

## 2018-08-06 ENCOUNTER — Ambulatory Visit (AMBULATORY_SURGERY_CENTER): Payer: Medicare Other | Admitting: Gastroenterology

## 2018-08-06 ENCOUNTER — Encounter: Payer: Self-pay | Admitting: Gastroenterology

## 2018-08-06 VITALS — BP 147/65 | HR 62 | Temp 98.7°F | Resp 16 | Ht 60.5 in | Wt 147.0 lb

## 2018-08-06 DIAGNOSIS — K210 Gastro-esophageal reflux disease with esophagitis: Secondary | ICD-10-CM

## 2018-08-06 DIAGNOSIS — R1310 Dysphagia, unspecified: Secondary | ICD-10-CM | POA: Diagnosis not present

## 2018-08-06 DIAGNOSIS — K449 Diaphragmatic hernia without obstruction or gangrene: Secondary | ICD-10-CM

## 2018-08-06 DIAGNOSIS — K222 Esophageal obstruction: Secondary | ICD-10-CM | POA: Diagnosis not present

## 2018-08-06 DIAGNOSIS — R1319 Other dysphagia: Secondary | ICD-10-CM

## 2018-08-06 MED ORDER — SODIUM CHLORIDE 0.9 % IV SOLN: 500.0000 mL | Freq: Once | INTRAVENOUS | Status: DC

## 2018-08-06 NOTE — Op Note (Addendum)
[facility] Patient Name: Leah [name] Procedure Date: 08/06/2018 9:46 AM MRN: [id number] Endoscopist: Ladene Artist , MD Age: 85 Referring MD:  Date of Birth: [birth year] Gender: Female Account #: [id number] Procedure:                Upper GI endoscopy Indications:              Dysphagia, For therapy of esophageal stricture Medicines:                Monitored Anesthesia Care Procedure:                Pre-Anesthesia Assessment:                           - Prior to the procedure, a History and Physical                            was performed, and patient medications and                            allergies were reviewed. The patient's tolerance of                            previous anesthesia was also reviewed. The risks                            and benefits of the procedure and the sedation                            options and risks were discussed with the patient.                            All questions were answered, and informed consent                            was obtained. Prior Anticoagulants: The patient has                            taken no previous anticoagulant or antiplatelet                            agents. ASA Grade Assessment: II - A patient with                            mild systemic disease. After reviewing the risks                            and benefits, the patient was deemed in                            satisfactory condition to undergo the procedure.                           After obtaining informed consent, the endoscope was  passed under direct vision. Throughout the                            procedure, the patient's blood pressure, pulse, and                            oxygen saturations were monitored continuously. The                            Endoscope was introduced through the mouth, and                            advanced to the second part of duodenum. The upper                            GI  endoscopy was accomplished without difficulty.                            The patient tolerated the procedure well. Scope In: Scope Out: Findings:                 LA Grade D (one or more mucosal breaks involving at                            least 75% of esophageal circumference) esophagitis                            with no bleeding was found in the distal esophagus.                           One benign-appearing, intrinsic moderate stenosis                            was found 30 cm from the incisors. This stenosis                            measured 1.2 cm (inner diameter). The stenosis was                            traversed. A guidewire was placed and the scope was                            withdrawn. Dilations were performed with Savary                            dilators with mild resistance at 13 mm, 14 mm and                            15 mm. Small heme on last 2 dilators.                           The exam of the esophagus was otherwise normal.  A medium-sized hiatal hernia was present.                           The exam of the stomach was otherwise normal.                            Prepyloric ulcer has healed.                           The duodenal bulb and second portion of the                            duodenum were normal. Complications:            No immediate complications. Estimated Blood Loss:     Estimated blood loss was minimal. Impression:               - LA Grade D reflux esophagitis.                           - Benign-appearing esophageal stenosis. Dilated.                           - Medium-sized hiatal hernia.                           - Normal duodenal bulb and second portion of the                            duodenum.                           - No specimens collected. Recommendation:           - Patient has a contact number available for                            emergencies. The signs and symptoms of potential                             delayed complications were discussed with the                            patient. Return to normal activities tomorrow.                            Written discharge instructions were provided to the                            patient.                           - Clear liquid diet for 2 hours, then advance as                            tolerated to soft diet today.                           -  Resume prior diet tomorrow.                           - Antireflux measures long term.                           - Continue present medications including                            pantoprazole 40 mg daily long term.                           - If she has recurrent ulcers or erosive gastritis                            consider discontinuing Mobic.                           - Return to GI office in 2 months. Ladene Artist, MD 08/06/2018 10:12:14 AM This report has been signed electronically.

## 2018-08-06 NOTE — Patient Instructions (Signed)
Information on hiatal hernia, esophagitis and dilation diet given to you today.  Clear liquids until 12:15 today and then soft diet until tomorrow.  Resume prior diet tomorrow.  Return to GI office in 2 months.YOU HAD AN ENDOSCOPIC PROCEDURE TODAY AT [facility]:   Refer to the procedure report that was given to you for any specific questions about what was found during the examination.  If the procedure report does not answer your questions, please call your gastroenterologist to clarify.  If you requested that your care partner not be given the details of your procedure findings, then the procedure report has been included in a sealed envelope for you to review at your convenience later.  YOU SHOULD EXPECT: Some feelings of bloating in the abdomen. Passage of more gas than usual.  Walking can help get rid of the air that was put into your GI tract during the procedure and reduce the bloating. If you had a lower endoscopy (such as a colonoscopy or flexible sigmoidoscopy) you may notice spotting of blood in your stool or on the toilet paper. If you underwent a bowel prep for your procedure, you may not have a normal bowel movement for a few days.  Please Note:  You might notice some irritation and congestion in your nose or some drainage.  This is from the oxygen used during your procedure.  There is no need for concern and it should clear up in a day or so.  SYMPTOMS TO REPORT IMMEDIATELY:    Following upper endoscopy (EGD)  Vomiting of blood or coffee ground material  New chest pain or pain under the shoulder blades  Painful or persistently difficult swallowing  New shortness of breath  Fever of 100F or higher  Black, tarry-looking stools  For urgent or emergent issues, a gastroenterologist can be reached at any hour by calling [phone].   DIET:  We do recommend a small meal at first, but then you may proceed to your regular diet.  Drink plenty of fluids but you  should avoid alcoholic beverages for 24 hours.  ACTIVITY:  You should plan to take it easy for the rest of today and you should NOT DRIVE or use heavy machinery until tomorrow (because of the sedation medicines used during the test).    FOLLOW UP: Our staff will call the number listed on your records the next business day following your procedure to check on you and address any questions or concerns that you may have regarding the information given to you following your procedure. If we do not reach you, we will leave a message.  However, if you are feeling well and you are not experiencing any problems, there is no need to return our call.  We will assume that you have returned to your regular daily activities without incident.  If any biopsies were taken you will be contacted by phone or by letter within the next 1-3 weeks.  Please call us at [phone] if you have not heard about the biopsies in 3 weeks.    SIGNATURES/CONFIDENTIALITY: You and/or your care partner have signed paperwork which will be entered into your electronic medical record.  These signatures attest to the fact that that the information above on your After Visit Summary has been reviewed and is understood.  Full responsibility of the confidentiality of this discharge information lies with you and/or your care-partner.

## 2018-08-06 NOTE — Progress Notes (Signed)
Pt. Reports no change in her medical or surgical history since her pre-visit 08/02/2018.

## 2018-08-06 NOTE — Progress Notes (Signed)
To PACU, VSS. Report to [email or web address] 

## 2018-08-06 NOTE — Progress Notes (Signed)
Called to room to assist during endoscopic procedure.  Patient ID and intended procedure confirmed with present staff. Received instructions for my participation in the procedure from the performing physician.  

## 2018-08-09 ENCOUNTER — Telehealth: Payer: Self-pay

## 2018-08-09 ENCOUNTER — Telehealth: Payer: Self-pay | Admitting: *Deleted

## 2018-08-09 NOTE — Telephone Encounter (Signed)
Called back to advice [place] that she is doing fine after her procedure.

## 2018-08-09 NOTE — Telephone Encounter (Signed)
Left message on answering machine. 

## 2018-08-09 NOTE — Telephone Encounter (Signed)
First attempt, left VM.  

## 2018-08-17 ENCOUNTER — Ambulatory Visit: Payer: Self-pay | Admitting: *Deleted

## 2018-08-17 ENCOUNTER — Other Ambulatory Visit: Payer: Self-pay

## 2018-08-17 ENCOUNTER — Ambulatory Visit: Payer: Medicare Other | Admitting: Internal Medicine

## 2018-08-17 ENCOUNTER — Encounter: Payer: Self-pay | Admitting: Internal Medicine

## 2018-08-17 ENCOUNTER — Telehealth: Payer: Self-pay | Admitting: Gastroenterology

## 2018-08-17 DIAGNOSIS — L299 Pruritus, unspecified: Secondary | ICD-10-CM | POA: Insufficient documentation

## 2018-08-17 NOTE — Telephone Encounter (Signed)
Noted  

## 2018-08-17 NOTE — Telephone Encounter (Signed)
Message from Margot Ables sent at 08/17/2018 12:11 PM EDT   Summary: itching x 1 week (last Wednesday)   See Tel notes from GI 08/17/2018 - pt requesting call from PCP nurse Patient states a week ago she started having itching all over her body and tingling sensation. Patient states she thinks it's a interaction with her mobic and pantoprazole so she has been off of the medication for a week. Informed patient that I do not think these symptoms are related to her pantoprazole and mobic because she has been on these medications for several months. Also patient states she has been off pantoprazole and her symptoms have not improved. Informed patient to restart her pantoprazole and to contact her PCP about the itching and tingling. Patient verbalized understanding and will contact PCP.         Contacted pt regarding symptoms; the says that she has been itching since 08/10/2018 (fromher scalp to feet); she states that there is no rash; recommendations made per nurse triage protocol' pt offered and accepted appointment with Dr Pricilla Holm, [place], 08/17/2018 at 1440; she verbalized understanding; will route to office for notification.   Reason for Disposition . [1] MODERATE-SEVERE widespread itching (i.e., interferes with sleep, normal activities or school) AND [2] not improved after 24 hours of itching Care Advice  Answer Assessment - Initial Assessment Questions 1. DESCRIPTION: "Describe the itching you are having."     All over body head to feet 2. SEVERITY: "How bad is it?"    - MILD - doesn't interfere with normal activities   - MODERATE - SEVERE: interferes with work, school, sleep, or other activities      moderate 3. SCRATCHING: "Are there any scratch marks? Bleeding?"     Yes scratching; no scratch marks 4. ONSET: "When did this begin?"      08/10/2018 5. CAUSE: "What do you think is causing the itching?" (ask about swimming pools, pollen, animals, soaps, etc.)     Maybe nerves 6.  OTHER SYMPTOMS: "Do you have any other symptoms?"      no 7. PREGNANCY: "Is there any chance you are pregnant?" "When was your last menstrual period?"     no  Protocols used: ITCHING [facility]

## 2018-08-17 NOTE — Telephone Encounter (Signed)
Patient states a week ago she started having itching all over her body and tingling sensation. Patient states she thinks it's a interaction with her mobic and pantoprazole so she has been off of the medication for a week. Informed patient that I do not think these symptoms are related to her pantoprazole and mobic because she has been on these medications for several months. Also patient states she has been off pantoprazole and her symptoms have not improved. Informed patient to restart her pantoprazole and to contact her PCP about the itching and tingling. Patient verbalized understanding and will contact PCP.

## 2018-08-17 NOTE — Assessment & Plan Note (Signed)
Advised zyrtec 10 mg TID for itching and benadryl at night time prn. Likely related to recent sedation or dilation.

## 2018-08-17 NOTE — Patient Instructions (Signed)
We will have you take zyrtec (cetirizine) 1 pill three times a day for the next 3-4 days.   You can also use benadryl (diphenhydramine) in the evening for itching.   If this gets worse or does not get better let us know.

## 2018-08-17 NOTE — Telephone Encounter (Signed)
Pls call pt, she thinks that Pantoprazole is having an interaction with Mobic.

## 2018-08-17 NOTE — Progress Notes (Signed)
   Subjective:   Patient ID: Leah [name], female    DOB: [birth year], 85 y.o.   MRN: [id number]  HPI The patient is an 85 YO female coming in for itching. Started 08/10/2018. Overall it is stable to mild improvement. She just had sedation for EGD on 08/06/2018 and esophageal dilation. She thought initially it was mobic mixed with pantoprazole and stopped taking pantoprazole and this did not help. She called GI and they told her this was likely not the cause and advised that she call us. She has gotten hives in the past which has been related to stress and certain medications. No SOB or mouth swelling. No problems swallowing.   Review of Systems  Constitutional: Negative.   HENT: Negative.   Eyes: Negative.   Respiratory: Negative for cough, chest tightness and shortness of breath.   Cardiovascular: Negative for chest pain, palpitations and leg swelling.  Gastrointestinal: Negative for abdominal distention, abdominal pain, constipation, diarrhea, nausea and vomiting.  Musculoskeletal: Negative.   Skin: Negative.        itching  Neurological: Negative.   Psychiatric/Behavioral: Negative.     Objective:  Physical Exam Constitutional:      Appearance: She is well-developed.  HENT:     Head: Normocephalic and atraumatic.  Neck:     Musculoskeletal: Normal range of motion.  Cardiovascular:     Rate and Rhythm: Normal rate and regular rhythm.  Pulmonary:     Effort: Pulmonary effort is normal. No respiratory distress.     Breath sounds: Normal breath sounds. No wheezing or rales.  Abdominal:     General: Bowel sounds are normal. There is no distension.     Palpations: Abdomen is soft.     Tenderness: There is no abdominal tenderness. There is no rebound.  Skin:    General: Skin is warm and dry.  Neurological:     Mental Status: She is alert and oriented to person, place, and time.     Coordination: Coordination normal.     Vitals:   08/17/18 1434  BP: (!) 150/90  Pulse: 63   Temp: 98 F (36.7 C)  TempSrc: Oral  SpO2: 94%  Weight: 149 lb (67.6 kg)  Height: 5' 0.5" (1.537 m)    Assessment & Plan:

## 2018-10-01 ENCOUNTER — Ambulatory Visit (INDEPENDENT_AMBULATORY_CARE_PROVIDER_SITE_OTHER): Payer: Medicare Other

## 2018-10-01 ENCOUNTER — Other Ambulatory Visit: Payer: Self-pay | Admitting: Internal Medicine

## 2018-10-01 DIAGNOSIS — M810 Age-related osteoporosis without current pathological fracture: Secondary | ICD-10-CM

## 2018-10-01 MED ORDER — DENOSUMAB 60 MG/ML ~~LOC~~ SOSY
60.0000 mg | PREFILLED_SYRINGE | Freq: Once | SUBCUTANEOUS | Status: AC
  Administered 2018-10-01: 60 mg via SUBCUTANEOUS

## 2018-10-01 NOTE — Progress Notes (Signed)
Medical treatment/procedure(s) were performed by non-physician practitioner and as supervising physician I was immediately available for consultation/collaboration. I agree with above. Elizabeth A Crawford, MD  

## 2019-02-22 ENCOUNTER — Ambulatory Visit (INDEPENDENT_AMBULATORY_CARE_PROVIDER_SITE_OTHER): Payer: Medicare Other

## 2019-02-22 ENCOUNTER — Encounter: Payer: Self-pay | Admitting: Family Medicine

## 2019-02-22 DIAGNOSIS — Z23 Encounter for immunization: Secondary | ICD-10-CM

## 2019-03-07 NOTE — Progress Notes (Signed)
Subjective:   Leah [name] is a 85 y.o. female who presents for Medicare Annual (Subsequent) preventive examination. I connected with patient by a telephone and verified that I am speaking with the correct person using two identifiers. Patient stated full name and DOB. Patient gave permission to continue with telephonic visit. Patient's location was at home and Nurse's location was at [place] office.   Review of Systems:   Cardiac Risk Factors include: advanced age (>27men, >17 women);dyslipidemia;hypertension Sleep patterns: gets up 2-3 times nightly to void and sleeps 7 hours nightly.    Home Safety/Smoke Alarms: Feels safe in home. Smoke alarms in place.  Living environment; residence and Firearm Safety: split level / walkout. Lives with husband, no needs for DME, good support system Seat Belt Safety/Bike Helmet: Wears seat belt.     Objective:     Vitals: There were no vitals taken for this visit.  There is no height or weight on file to calculate BMI.  Advanced Directives 03/08/2019 02/04/2018 11/01/2015 10/13/2011 10/13/2011 10/06/2011  Does Patient Have a Medical Advance Directive? Yes No Yes Patient has advance directive, copy not in chart - Patient has advance directive, copy not in chart  Type of Advance Directive [place];Living will - [place];Living will [place];Living will - [place];Living will  Does patient want to make changes to medical advance directive? - - No - Patient declined - - -  Copy of [place] in Chart? No - copy requested - Yes Copy requested from other (Comment) - (No Data)  Would patient like information on creating a medical advance directive? - No - Patient declined - - - -  Pre-existing out of facility DNR order (yellow form or pink MOST form) - - - No No -    Tobacco Social History   Tobacco Use  Smoking Status Former Smoker  . Quit date: 10/06/1958   . Years since quitting: 60.4  Smokeless Tobacco Never Used     Counseling given: Not Answered  Past Medical History:  Diagnosis Date  . Acute maxillary sinusitis   . Allergy   . Bundle branch block, unspecified    EKG 4/13 with clearance and note that isnt new block Dr Arnoldo Morale on chart  . Carpal tunnel syndrome   . Cystocele   . Detrusor instability   . GERD (gastroesophageal reflux disease)   . Glaucoma   . Hyperlipidemia   . Localized osteoarthrosis not specified whether primary or secondary, lower leg   . Osteoporosis 01/2014   T score -2.5 UD forarm, stable at the spine recommend 2 year follow up DEXA  . PONV (postoperative nausea and vomiting)    19 yrs ago- states has had general anesthesia since and did well  . Unspecified adverse effect of unspecified drug, medicinal and biological substance   . Uterine prolapse    Past Surgical History:  Procedure Laterality Date  . arthroscopic r knee    . CARPAL TUNNEL RELEASE  2011  . DILATION AND CURETTAGE OF UTERUS  1982  . EYE SURGERY     bilateral cataract extraction  with IOL  . FLEXIBLE SIGMOIDOSCOPY    . HYSTEROSCOPY    . HYSTEROSCOPY W/D&C  2005 and 2008  . JOINT REPLACEMENT     bilateral total hip arthroplasty  . total hip rerplacement bilateral    . TOTAL KNEE ARTHROPLASTY  10/13/2011   Procedure: TOTAL KNEE ARTHROPLASTY;  Surgeon: Dione Plover  [name], MD;  Location: [facility];  Service: Orthopedics;  Laterality: Right;  . UPPER GI ENDOSCOPY  07/2018 and 3/20   Family History  Problem Relation Age of Onset  . Dementia Mother   . Breast cancer Mother 29  . Heart disease Father   . Stroke Father   . Dementia Sister   . Colon cancer Neg Hx   . Stomach cancer Neg Hx   . Rectal cancer Neg Hx   . Esophageal cancer Neg Hx   . Colon polyps Neg Hx    Social History   Socioeconomic History  . Marital status: Married    Spouse name: Not on file  . Number of children: 2  . Years of education: Not on file  . Highest  education level: Not on file  Occupational History  . Occupation: retired  Scientific laboratory technician  . Financial resource strain: Not hard at all  . Food insecurity    Worry: Never true    Inability: Never true  . Transportation needs    Medical: No    Non-medical: No  Tobacco Use  . Smoking status: Former Smoker    Quit date: 10/06/1958    Years since quitting: 60.4  . Smokeless tobacco: Never Used  Substance and Sexual Activity  . Alcohol use: Yes    Alcohol/week: 1.0 standard drinks    Types: 1 Glasses of Donovyn Guidice per week    Comment: once a month  . Drug use: No  . Sexual activity: Never    Comment: 1st intercourse 73 yo-1 partner  Lifestyle  . Physical activity    Days per week: 3 days    Minutes per session: 30 min  . Stress: Only a little  Relationships  . Social connections    Talks on phone: More than three times a week    Gets together: More than three times a week    Attends religious service: More than 4 times per year    Active member of club or organization: Yes    Attends meetings of clubs or organizations: More than 4 times per year    Relationship status: Married  Other Topics Concern  . Not on file  Social History Narrative  . Not on file    Outpatient Encounter Medications as of 03/08/2019  Medication Sig  . acetaminophen (TYLENOL) 500 MG tablet Take 1,000 mg by mouth every 6 (six) hours as needed.  . Ascorbic Acid (VITAMIN C) 1000 MG tablet Take 1,000 mg by mouth daily.  . cholecalciferol (VITAMIN D) 1000 UNITS tablet Take 1,000 Units by mouth daily.  Marland Kitchen denosumab (PROLIA) 60 MG/ML SOLN injection Inject 60 mg into the skin every 6 (six) months. Administer in upper arm, thigh, or abdomen  . glucosamine-chondroitin 500-400 MG tablet Take 1 tablet by mouth 3 (three) times daily.  Marland Kitchen latanoprost (XALATAN) 0.005 % ophthalmic solution Place 1 drop into both eyes at bedtime.   . meloxicam (MOBIC) 15 MG tablet TAKE 1 TABLET EACH DAY.  Marland Kitchen Omega-3 Fatty Acids (FISH OIL) 1000  MG CAPS Take 2 capsules by mouth 2 (two) times daily.  . pantoprazole (PROTONIX) 40 MG tablet Take 1 tablet (40 mg total) by mouth daily.  . timolol (TIMOPTIC-XR) 0.5 % ophthalmic gel-forming   . vitamin E 400 UNIT capsule Take 400 Units by mouth daily.  . [DISCONTINUED] Azelastine-Fluticasone (DYMISTA) 137-50 MCG/ACT SUSP Place 1 spray into both nostrils 2 (two) times daily. (Patient not taking: Reported on 08/06/2018)  . [DISCONTINUED] Multiple Vitamins-Minerals (  MULTIPLE VITAMINS/WOMENS) tablet Take 2 tablets by mouth daily.  . [DISCONTINUED] Probiotic Product (ALIGN PO) Take by mouth.   No facility-administered encounter medications on file as of 03/08/2019.     Activities of Daily Living In your present state of health, do you have any difficulty performing the following activities: 03/08/2019  Hearing? N  Vision? N  Difficulty concentrating or making decisions? N  Walking or climbing stairs? N  Dressing or bathing? N  Doing errands, shopping? N  Preparing Food and eating ? N  Using the Toilet? N  In the past six months, have you accidently leaked urine? N  Do you have problems with loss of bowel control? N  Managing your Medications? N  Managing your Finances? N  Housekeeping or managing your Housekeeping? N  Some recent data might be hidden    Patient Care Team: Hoyt Koch, MD as PCP - General (Internal Medicine) Fontaine, Belinda Block, MD as Consulting Physician (Gynecology) Marygrace Drought, MD as Consulting Physician (Ophthalmology) Ladene Artist, MD as Consulting Physician (Gastroenterology)    Assessment:   This is a routine wellness examination for [place]. Physical assessment deferred to PCP.   Exercise Activities and Dietary recommendations Current Exercise Habits: Home exercise routine, Type of exercise: walking, Time (Minutes): 30, Frequency (Times/Week): 3, Weekly Exercise (Minutes/Week): 90, Intensity: Mild, Exercise limited by: orthopedic condition(s)  Diet (meal preparation, eat out, water intake, caffeinated beverages, dairy products, fruits and vegetables): in general, a "healthy" diet  , well balanced  Reviewed heart healthy diet. Encouraged patient to maintain daily water and healthy fluid intake.  Goals    . Patient Stated     Maintain current health status and stay physically and socially active.       Fall Risk Fall Risk  03/08/2019 07/16/2018 05/21/2017 11/01/2015 07/04/2015  Falls in the past year? 0 0 No No No  Number falls in past yr: 0 - - - -  Injury with Fall? 0 - - - -   Is the patient's home free of loose throw rugs in walkways, pet beds, electrical cords, etc?   yes      Grab bars in the bathroom? yes      Handrails on the stairs?   yes      Adequate lighting?   yes  Depression Screen PHQ 2/9 Scores 03/08/2019 07/16/2018 05/21/2017 05/21/2017  PHQ - 2 Score 1 0 0 0     Cognitive Function MMSE - Mini Mental State Exam 03/08/2019  Not completed: Refused        Immunization History  Administered Date(s) Administered  . Fluad Quad(high Dose 65+) 02/22/2019  . Influenza Split 03/13/2011  . Influenza Whole 06/02/2004, 04/02/2007, 02/24/2008, 03/30/2009, 02/14/2010  . Influenza, High Dose Seasonal PF 03/06/2014, 02/03/2018  . Influenza,inj,Quad PF,6+ Mos 03/12/2017  . Influenza-Unspecified 03/11/2013, 03/03/2015  . Pneumococcal Polysaccharide-23 06/02/2005, 09/08/2013  . Td 06/02/2001, 09/24/2011   Screening Tests Health Maintenance  Topic Date Due  . PNA vac Low Risk Adult (2 of 2 - PCV13) 09/09/2014  . TETANUS/TDAP  09/23/2021  . INFLUENZA VACCINE  Completed  . DEXA SCAN  Completed      Plan:    Reviewed health maintenance screenings with patient today and relevant education, vaccines, and/or referrals were provided.   Continue to eat heart healthy diet (full of fruits, vegetables, whole grains, lean protein, water--limit salt, fat, and sugar intake) and increase physical activity as tolerated.   Continue doing brain stimulating activities (puzzles, reading, adult coloring  books, staying active) to keep memory sharp.   I have personally reviewed and noted the following in the patient's chart:   . Medical and social history . Use of alcohol, tobacco or illicit drugs  . Current medications and supplements . Functional ability and status . Nutritional status . Physical activity . Advanced directives . List of other physicians . Screenings to include cognitive, depression, and falls . Referrals and appointments  In addition, I have reviewed and discussed with patient certain preventive protocols, quality metrics, and best practice recommendations. A written personalized care plan for preventive services as well as general preventive health recommendations were provided to patient.     Michiel Cowboy, RN  03/08/2019

## 2019-03-08 ENCOUNTER — Telehealth: Payer: Self-pay

## 2019-03-08 ENCOUNTER — Ambulatory Visit (INDEPENDENT_AMBULATORY_CARE_PROVIDER_SITE_OTHER): Payer: Medicare Other | Admitting: *Deleted

## 2019-03-08 DIAGNOSIS — Z0000 Encounter for general adult medical examination without abnormal findings: Secondary | ICD-10-CM

## 2019-03-08 NOTE — Progress Notes (Signed)
Medical screening examination/treatment/procedure(s) were performed by non-physician practitioner and as supervising physician I was immediately available for consultation/collaboration. I agree with above. Aeryn Medici A Vernecia Umble, MD 

## 2019-03-09 ENCOUNTER — Ambulatory Visit (INDEPENDENT_AMBULATORY_CARE_PROVIDER_SITE_OTHER): Payer: Medicare Other

## 2019-03-09 DIAGNOSIS — Z23 Encounter for immunization: Secondary | ICD-10-CM | POA: Diagnosis not present

## 2019-03-09 DIAGNOSIS — Z299 Encounter for prophylactic measures, unspecified: Secondary | ICD-10-CM

## 2019-03-09 NOTE — Telephone Encounter (Signed)
error 

## 2019-03-10 ENCOUNTER — Encounter: Payer: Self-pay | Admitting: Gynecology

## 2019-03-11 ENCOUNTER — Other Ambulatory Visit: Payer: Self-pay | Admitting: Internal Medicine

## 2019-03-21 ENCOUNTER — Other Ambulatory Visit: Payer: Self-pay

## 2019-03-22 ENCOUNTER — Ambulatory Visit (INDEPENDENT_AMBULATORY_CARE_PROVIDER_SITE_OTHER): Payer: Medicare Other | Admitting: Gynecology

## 2019-03-22 ENCOUNTER — Encounter: Payer: Self-pay | Admitting: Gynecology

## 2019-03-22 VITALS — BP 130/84 | Ht 60.0 in | Wt 144.0 lb

## 2019-03-22 DIAGNOSIS — Z01419 Encounter for gynecological examination (general) (routine) without abnormal findings: Secondary | ICD-10-CM | POA: Diagnosis not present

## 2019-03-22 DIAGNOSIS — N952 Postmenopausal atrophic vaginitis: Secondary | ICD-10-CM

## 2019-03-22 DIAGNOSIS — M810 Age-related osteoporosis without current pathological fracture: Secondary | ICD-10-CM | POA: Diagnosis not present

## 2019-03-22 DIAGNOSIS — N8189 Other female genital prolapse: Secondary | ICD-10-CM | POA: Diagnosis not present

## 2019-03-22 NOTE — Progress Notes (Signed)
    Leah [name] 1930/04/03 [id number]        85 y.o.  G2P2002 for annual gynecologic exam.  Without gynecologic complaints  Past medical history,surgical history, problem list, medications, allergies, family history and social history were all reviewed and documented as reviewed in the EPIC chart.  ROS:  Performed with pertinent positives and negatives included in the history, assessment and plan.   Additional significant findings : None   Exam: Caryn Bee assistant Vitals:   03/22/19 1030  BP: 130/84  Weight: 144 lb (65.3 kg)  Height: 5' (1.524 m)   Body mass index is 28.12 kg/m.  General appearance:  Normal affect, orientation and appearance. Skin: Grossly normal HEENT: Without gross lesions.  No cervical or supraclavicular adenopathy. Thyroid normal.  Lungs:  Clear without wheezing, rales or rhonchi Cardiac: RR, without RMG Abdominal:  Soft, nontender, without masses, guarding, rebound, organomegaly or hernia Breasts:  Examined lying and sitting without masses, retractions, discharge or axillary adenopathy. Pelvic:  Ext, BUS, Vagina: With atrophic changes.  Second-degree cystocele.  First-degree uterine prolapse.  First-degree rectocele  Cervix: With atrophic changes  Uterus: Anteverted, normal size, shape and contour, midline and mobile nontender   Adnexa: Without masses or tenderness    Anus and perineum: Normal   Rectovaginal: Normal sphincter tone without palpated masses or tenderness.    Assessment/Plan:  85 y.o. G84P2002 female for annual gynecologic exam.   1. Postmenopausal.  No significant menopausal symptoms or any vaginal bleeding. 2. Pelvic relaxation.  Cystocele/rectocele/uterine prolapse.  Patient without significant symptoms.  Stable on serial exams.  We will continue to monitor and report if becomes symptomatic. 3. Mammography scheduled next month.  Breast exam normal today. 4. Osteoporosis.  History of bisphosphate use 2003 through 2011.   Currently on Prolia started 2018.  Continue on Prolia.  Will readdress DEXA next year.  As were only measuring distal third of radius question is whether to stop screening and just continue on Prolia discussed.  Will readdress next year. 5. Colonoscopy 2009.  We will follow-up with primary providers recommendation for colon screening. 6. Pap smear 2011.  No Pap smear done today.  No history of abnormal Pap smears.  We both agree to stop screening per current screening guidelines. 7. Health maintenance.  No routine lab work done as patient does this elsewhere.  Follow-up 1 year, sooner as needed.   Anastasio Auerbach MD, 11:02 AM 03/22/2019

## 2019-03-22 NOTE — Patient Instructions (Signed)
Continue on the Prolia every 6 months. Follow-up in 1 year for annual exam

## 2019-03-28 ENCOUNTER — Telehealth: Payer: Self-pay | Admitting: Internal Medicine

## 2019-03-28 NOTE — Telephone Encounter (Signed)
Patient has been submitted for insurance approval for Prolia injection through the Prolia Portal.  °Waiting on benefits. °Will contact patient once these have been received. °

## 2019-04-04 ENCOUNTER — Encounter: Payer: Self-pay | Admitting: Gynecology

## 2019-05-10 NOTE — Telephone Encounter (Signed)
Insurance has been submitted and verified for Prolia. Patient is responsible for a $0 copay. Due anytime. Appointment scheduled 05/12/2019.

## 2019-05-12 ENCOUNTER — Other Ambulatory Visit: Payer: Self-pay

## 2019-05-12 ENCOUNTER — Ambulatory Visit (INDEPENDENT_AMBULATORY_CARE_PROVIDER_SITE_OTHER): Payer: Medicare Other

## 2019-05-12 DIAGNOSIS — M810 Age-related osteoporosis without current pathological fracture: Secondary | ICD-10-CM

## 2019-05-12 MED ORDER — DENOSUMAB 60 MG/ML ~~LOC~~ SOSY
60.0000 mg | PREFILLED_SYRINGE | Freq: Once | SUBCUTANEOUS | Status: AC
  Administered 2019-05-12: 15:00:00 60 mg via SUBCUTANEOUS

## 2019-05-12 NOTE — Progress Notes (Signed)
Medical treatment/procedure(s) were performed by non-physician practitioner and as supervising physician I was immediately available for consultation/collaboration. I agree with above. Yanet Balliet A Edythe Riches, MD  

## 2019-06-04 ENCOUNTER — Other Ambulatory Visit: Payer: Self-pay | Admitting: Internal Medicine

## 2019-06-21 ENCOUNTER — Ambulatory Visit: Payer: Medicare PPO | Attending: Internal Medicine

## 2019-06-21 DIAGNOSIS — Z23 Encounter for immunization: Secondary | ICD-10-CM | POA: Insufficient documentation

## 2019-06-21 NOTE — Progress Notes (Signed)
   Covid-19 Vaccination Clinic  Name:  Leah [name]    MRN: [id number] DOB: Oct 10, 1929  06/21/2019  [name] was observed post Covid-19 immunization for 15 minutes without incidence. She was provided with Vaccine Information Sheet and instruction to access the V-Safe system.   [name] was instructed to call 911 with any severe reactions post vaccine: Marland Kitchen Difficulty breathing  . Swelling of your face and throat  . A fast heartbeat  . A bad rash all over your body  . Dizziness and weakness    Immunizations Administered    Name Date Dose VIS Date Route   Pfizer COVID-19 Vaccine 06/21/2019  2:36 PM 0.3 mL 05/13/2019 Intramuscular   Manufacturer: [employer]   Lot: [id number]   [place]: [id number]

## 2019-07-11 ENCOUNTER — Ambulatory Visit: Payer: Medicare PPO | Attending: Internal Medicine

## 2019-07-11 DIAGNOSIS — Z23 Encounter for immunization: Secondary | ICD-10-CM | POA: Insufficient documentation

## 2019-07-11 NOTE — Progress Notes (Signed)
   Covid-19 Vaccination Clinic  Name:  Leah [name]    MRN: [id number] DOB: 07-30-1929  07/11/2019  [name] was observed post Covid-19 immunization for 15 minutes without incidence. She was provided with Vaccine Information Sheet and instruction to access the V-Safe system.   [name] was instructed to call 911 with any severe reactions post vaccine: Marland Kitchen Difficulty breathing  . Swelling of your face and throat  . A fast heartbeat  . A bad rash all over your body  . Dizziness and weakness    Immunizations Administered    Name Date Dose VIS Date Route   Pfizer COVID-19 Vaccine 07/11/2019  4:06 PM 0.3 mL 05/13/2019 Intramuscular   Manufacturer: [place]   Lot: [id number]   [place]: [id number]

## 2019-08-09 DIAGNOSIS — M48061 Spinal stenosis, lumbar region without neurogenic claudication: Secondary | ICD-10-CM | POA: Diagnosis not present

## 2019-08-09 DIAGNOSIS — M533 Sacrococcygeal disorders, not elsewhere classified: Secondary | ICD-10-CM | POA: Diagnosis not present

## 2019-08-10 DIAGNOSIS — H43393 Other vitreous opacities, bilateral: Secondary | ICD-10-CM | POA: Diagnosis not present

## 2019-08-10 DIAGNOSIS — H43813 Vitreous degeneration, bilateral: Secondary | ICD-10-CM | POA: Diagnosis not present

## 2019-08-10 DIAGNOSIS — H43822 Vitreomacular adhesion, left eye: Secondary | ICD-10-CM | POA: Diagnosis not present

## 2019-08-10 DIAGNOSIS — H35372 Puckering of macula, left eye: Secondary | ICD-10-CM | POA: Diagnosis not present

## 2019-08-16 ENCOUNTER — Other Ambulatory Visit: Payer: Self-pay

## 2019-08-16 ENCOUNTER — Ambulatory Visit: Payer: Medicare PPO | Admitting: Internal Medicine

## 2019-08-16 ENCOUNTER — Encounter: Payer: Self-pay | Admitting: Internal Medicine

## 2019-08-16 VITALS — BP 118/84 | HR 66 | Temp 98.7°F | Ht 60.0 in | Wt 143.5 lb

## 2019-08-16 DIAGNOSIS — R5383 Other fatigue: Secondary | ICD-10-CM | POA: Diagnosis not present

## 2019-08-16 DIAGNOSIS — E785 Hyperlipidemia, unspecified: Secondary | ICD-10-CM

## 2019-08-16 DIAGNOSIS — E559 Vitamin D deficiency, unspecified: Secondary | ICD-10-CM | POA: Diagnosis not present

## 2019-08-16 DIAGNOSIS — E538 Deficiency of other specified B group vitamins: Secondary | ICD-10-CM

## 2019-08-16 LAB — COMPREHENSIVE METABOLIC PANEL
ALT: 13 U/L (ref 0–35)
AST: 20 U/L (ref 0–37)
Albumin: 3.9 g/dL (ref 3.5–5.2)
Alkaline Phosphatase: 52 U/L (ref 39–117)
BUN: 26 mg/dL — ABNORMAL HIGH (ref 6–23)
CO2: 29 mEq/L (ref 19–32)
Calcium: 9.3 mg/dL (ref 8.4–10.5)
Chloride: 102 mEq/L (ref 96–112)
Creatinine, Ser: 1.21 mg/dL — ABNORMAL HIGH (ref 0.40–1.20)
GFR: 42.07 mL/min — ABNORMAL LOW (ref >60.00)
Glucose, Bld: 114 mg/dL — ABNORMAL HIGH (ref 70–99)
Potassium: 4.2 mEq/L (ref 3.5–5.1)
Sodium: 138 mEq/L (ref 135–145)
Total Bilirubin: 0.6 mg/dL (ref 0.2–1.2)
Total Protein: 6.9 g/dL (ref 6.0–8.3)

## 2019-08-16 LAB — LDL CHOLESTEROL, DIRECT: Direct LDL: 106 mg/dL

## 2019-08-16 LAB — VITAMIN B12: Vitamin B-12: 389 pg/mL (ref 211–911)

## 2019-08-16 LAB — CBC
HCT: 39 % (ref 36.0–46.0)
Hemoglobin: 13.1 g/dL (ref 12.0–15.0)
MCHC: 33.5 g/dL (ref 30.0–36.0)
MCV: 90.1 fl (ref 78.0–100.0)
Platelets: 208 10*3/uL (ref 150.0–400.0)
RBC: 4.33 Mil/uL (ref 3.87–5.11)
RDW: 14.8 % (ref 11.5–15.5)
WBC: 5 10*3/uL (ref 4.0–10.5)

## 2019-08-16 LAB — LIPID PANEL
Cholesterol: 205 mg/dL — ABNORMAL HIGH (ref 0–200)
HDL: 71.1 mg/dL (ref >39.00)
NonHDL: 133.4
Total CHOL/HDL Ratio: 3
Triglycerides: 213 mg/dL — ABNORMAL HIGH (ref 0.0–149.0)
VLDL: 42.6 mg/dL — ABNORMAL HIGH (ref 0.0–40.0)

## 2019-08-16 LAB — VITAMIN D 25 HYDROXY (VIT D DEFICIENCY, FRACTURES): VITD: 57.68 ng/mL (ref 30.00–100.00)

## 2019-08-16 LAB — TSH: TSH: 2.43 u[IU]/mL (ref 0.35–4.50)

## 2019-08-16 NOTE — Patient Instructions (Signed)
We will check the labs today. 

## 2019-08-16 NOTE — Progress Notes (Signed)
   Subjective:   Patient ID: Leah [name], female    DOB: 1930-04-06, 86 y.o.   MRN: [id number]  HPI The patient is an 86 YO female coming in for concerns about fatigue and worried that her blood sugar may be going low. When going for long periods of time without eating she can feel weak and more tired. She then eats and feels better. She denies weight loss/gain unexpected recently. She denies fevers or chills. Denies SOB or chest pains. Denies diarrhea or constipation or changes in bowels.   Review of Systems  Constitutional: Positive for fatigue. Negative for activity change, appetite change, fever and unexpected weight change.  HENT: Negative.   Eyes: Negative.   Respiratory: Negative for cough, chest tightness and shortness of breath.   Cardiovascular: Negative for chest pain, palpitations and leg swelling.  Gastrointestinal: Negative for abdominal distention, abdominal pain, constipation, diarrhea, nausea and vomiting.  Endocrine: Negative for polydipsia, polyphagia and polyuria.  Musculoskeletal: Negative.   Skin: Negative.   Neurological: Negative.   Psychiatric/Behavioral: Negative.     Objective:  Physical Exam Constitutional:      Appearance: She is well-developed.  HENT:     Head: Normocephalic and atraumatic.  Cardiovascular:     Rate and Rhythm: Normal rate and regular rhythm.  Pulmonary:     Effort: Pulmonary effort is normal. No respiratory distress.     Breath sounds: Normal breath sounds. No wheezing or rales.  Abdominal:     General: Bowel sounds are normal. There is no distension.     Palpations: Abdomen is soft.     Tenderness: There is no abdominal tenderness. There is no rebound.  Musculoskeletal:     Cervical back: Normal range of motion.  Skin:    General: Skin is warm and dry.  Neurological:     Mental Status: She is alert and oriented to person, place, and time.     Coordination: Coordination normal.     Vitals:   08/16/19 1340  BP:  118/84  Pulse: 66  Temp: 98.7 F (37.1 C)  TempSrc: Oral  SpO2: 97%  Weight: 143 lb 8 oz (65.1 kg)  Height: 5' (1.524 m)    This visit occurred during the SARS-CoV-2 public health emergency.  Safety protocols were in place, including screening questions prior to the visit, additional usage of staff PPE, and extensive cleaning of exam room while observing appropriate contact time as indicated for disinfecting solutions.   Assessment & Plan:

## 2019-08-17 DIAGNOSIS — M51369 Other intervertebral disc degeneration, lumbar region without mention of lumbar back pain or lower extremity pain: Secondary | ICD-10-CM | POA: Insufficient documentation

## 2019-08-17 DIAGNOSIS — M5136 Other intervertebral disc degeneration, lumbar region: Secondary | ICD-10-CM | POA: Insufficient documentation

## 2019-08-17 NOTE — Assessment & Plan Note (Signed)
Advised smaller more frequent meals. Checking CBC, CMP, vitamin D and B12, thyroid to evaluate for metabolic cause.

## 2019-08-18 ENCOUNTER — Other Ambulatory Visit: Payer: Self-pay | Admitting: Gastroenterology

## 2019-08-27 DIAGNOSIS — M5136 Other intervertebral disc degeneration, lumbar region: Secondary | ICD-10-CM | POA: Diagnosis not present

## 2019-09-23 ENCOUNTER — Ambulatory Visit: Payer: Medicare PPO | Admitting: Gastroenterology

## 2019-09-23 ENCOUNTER — Encounter: Payer: Self-pay | Admitting: Gastroenterology

## 2019-09-23 VITALS — BP 128/70 | HR 68 | Temp 98.7°F | Ht 60.0 in | Wt 144.0 lb

## 2019-09-23 DIAGNOSIS — Z8719 Personal history of other diseases of the digestive system: Secondary | ICD-10-CM

## 2019-09-23 DIAGNOSIS — K2100 Gastro-esophageal reflux disease with esophagitis, without bleeding: Secondary | ICD-10-CM | POA: Diagnosis not present

## 2019-09-23 MED ORDER — PANTOPRAZOLE SODIUM 40 MG PO TBEC: 40.0000 mg | DELAYED_RELEASE_TABLET | Freq: Every day | ORAL | 3 refills | Status: DC

## 2019-09-23 NOTE — Progress Notes (Signed)
    History of Present Illness: This is an 86 year old female with LA Grade D erosive esophagitis and an esophageal stricture.  She relates her reflux symptoms have been under very good control and only 1 or 2 brief episodes of solid food dysphagia since her dilation over 1 year ago.  Current Medications, Allergies, Past Medical History, Past Surgical History, Family History and Social History were reviewed in [employer] record.   Physical Exam: General: Well developed, well nourished, no acute distress Head: Normocephalic and atraumatic Eyes:  sclerae anicteric, EOMI Ears: Normal auditory acuity Mouth: Not examined, mask on during Covid-19 pandemic Lungs: Clear throughout to auscultation Heart: Regular rate and rhythm; no murmurs, rubs or bruits Abdomen: Soft, non tender and non distended. No masses, hepatosplenomegaly or hernias noted. Normal Bowel sounds Rectal: Not done Musculoskeletal: Symmetrical with no gross deformities  Pulses:  Normal pulses noted Extremities: No clubbing, cyanosis, edema or deformities noted Neurological: Alert oriented x 4, grossly nonfocal Psychological:  Alert and cooperative. Normal mood and affect   Assessment and Recommendations:  1.  GERD with LA grade D erosive esophagitis and esophageal stricture.  Closely follow antireflux measures.  Continue pantoprazole 40 mg daily.  Patient advised to call if she notes a worsening in dysphagia or reflux symptoms otherwise REV in 1 year.

## 2019-09-23 NOTE — Patient Instructions (Signed)
We have sent the following medications to your pharmacy for you to pick up at your convenience: pantoprazole.   Patient advised to avoid spicy, acidic, citrus, chocolate, mints, fruit and fruit juices.  Limit the intake of caffeine, alcohol and Soda.  Don't exercise too soon after eating.  Don't lie down within 3-4 hours of eating.  Elevate the head of your bed.  Thank you for choosing me and [facility].  [name]. [name]., MD., [name]

## 2019-09-30 DIAGNOSIS — M47816 Spondylosis without myelopathy or radiculopathy, lumbar region: Secondary | ICD-10-CM | POA: Insufficient documentation

## 2019-10-05 DIAGNOSIS — H401132 Primary open-angle glaucoma, bilateral, moderate stage: Secondary | ICD-10-CM | POA: Diagnosis not present

## 2019-10-06 DIAGNOSIS — M47896 Other spondylosis, lumbar region: Secondary | ICD-10-CM | POA: Diagnosis not present

## 2019-10-06 DIAGNOSIS — M47816 Spondylosis without myelopathy or radiculopathy, lumbar region: Secondary | ICD-10-CM | POA: Diagnosis not present

## 2019-10-27 DIAGNOSIS — M5136 Other intervertebral disc degeneration, lumbar region: Secondary | ICD-10-CM | POA: Diagnosis not present

## 2019-11-07 DIAGNOSIS — M48061 Spinal stenosis, lumbar region without neurogenic claudication: Secondary | ICD-10-CM | POA: Diagnosis not present

## 2019-11-07 DIAGNOSIS — M5136 Other intervertebral disc degeneration, lumbar region: Secondary | ICD-10-CM | POA: Diagnosis not present

## 2019-11-16 DIAGNOSIS — M545 Low back pain: Secondary | ICD-10-CM | POA: Diagnosis not present

## 2019-12-01 DIAGNOSIS — M545 Low back pain: Secondary | ICD-10-CM | POA: Diagnosis not present

## 2019-12-12 DIAGNOSIS — M545 Low back pain: Secondary | ICD-10-CM | POA: Diagnosis not present

## 2019-12-19 DIAGNOSIS — M545 Low back pain: Secondary | ICD-10-CM | POA: Diagnosis not present

## 2019-12-27 DIAGNOSIS — M545 Low back pain: Secondary | ICD-10-CM | POA: Diagnosis not present

## 2020-02-15 DIAGNOSIS — L814 Other melanin hyperpigmentation: Secondary | ICD-10-CM | POA: Diagnosis not present

## 2020-02-15 DIAGNOSIS — L570 Actinic keratosis: Secondary | ICD-10-CM | POA: Diagnosis not present

## 2020-02-15 DIAGNOSIS — L821 Other seborrheic keratosis: Secondary | ICD-10-CM | POA: Diagnosis not present

## 2020-02-15 DIAGNOSIS — D1801 Hemangioma of skin and subcutaneous tissue: Secondary | ICD-10-CM | POA: Diagnosis not present

## 2020-02-15 DIAGNOSIS — D692 Other nonthrombocytopenic purpura: Secondary | ICD-10-CM | POA: Diagnosis not present

## 2020-02-15 DIAGNOSIS — L565 Disseminated superficial actinic porokeratosis (DSAP): Secondary | ICD-10-CM | POA: Diagnosis not present

## 2020-02-15 DIAGNOSIS — Z85828 Personal history of other malignant neoplasm of skin: Secondary | ICD-10-CM | POA: Diagnosis not present

## 2020-02-16 ENCOUNTER — Other Ambulatory Visit: Payer: Self-pay | Admitting: Internal Medicine

## 2020-02-22 DIAGNOSIS — H401132 Primary open-angle glaucoma, bilateral, moderate stage: Secondary | ICD-10-CM | POA: Diagnosis not present

## 2020-02-22 DIAGNOSIS — H04123 Dry eye syndrome of bilateral lacrimal glands: Secondary | ICD-10-CM | POA: Diagnosis not present

## 2020-02-22 DIAGNOSIS — H524 Presbyopia: Secondary | ICD-10-CM | POA: Diagnosis not present

## 2020-02-22 DIAGNOSIS — H18593 Other hereditary corneal dystrophies, bilateral: Secondary | ICD-10-CM | POA: Diagnosis not present

## 2020-03-19 ENCOUNTER — Ambulatory Visit: Payer: Medicare PPO | Admitting: Family

## 2020-03-23 ENCOUNTER — Other Ambulatory Visit: Payer: Self-pay

## 2020-03-23 ENCOUNTER — Ambulatory Visit: Payer: Medicare PPO | Admitting: Obstetrics and Gynecology

## 2020-03-23 ENCOUNTER — Encounter: Payer: Self-pay | Admitting: Obstetrics and Gynecology

## 2020-03-23 VITALS — BP 126/78 | Ht 60.0 in | Wt 136.0 lb

## 2020-03-23 DIAGNOSIS — Z01419 Encounter for gynecological examination (general) (routine) without abnormal findings: Secondary | ICD-10-CM | POA: Diagnosis not present

## 2020-03-23 DIAGNOSIS — N8189 Other female genital prolapse: Secondary | ICD-10-CM

## 2020-03-23 DIAGNOSIS — M810 Age-related osteoporosis without current pathological fracture: Secondary | ICD-10-CM

## 2020-03-23 NOTE — Progress Notes (Signed)
Leah [name] 1930-03-22 [id number]  SUBJECTIVE:  86 y.o. [id number] female here for a breast and pelvic exam. She has no gynecologic concerns.  Current Outpatient Medications  Medication Sig Dispense Refill  . acetaminophen (TYLENOL) 500 MG tablet Take 1,000 mg by mouth every 6 (six) hours as needed.    . Ascorbic Acid (VITAMIN C) 1000 MG tablet Take 1,000 mg by mouth daily.    Marland Kitchen CALCIUM-MAGNESIUM-ZINC PO Take by mouth.    . cholecalciferol (VITAMIN D) 1000 UNITS tablet Take 1,000 Units by mouth daily.    Marland Kitchen denosumab (PROLIA) 60 MG/ML SOLN injection Inject 60 mg into the skin every 6 (six) months. Administer in upper arm, thigh, or abdomen    . glucosamine-chondroitin 500-400 MG tablet Take 1 tablet by mouth 3 (three) times daily.    Marland Kitchen latanoprost (XALATAN) 0.005 % ophthalmic solution Place 1 drop into both eyes at bedtime.     . meloxicam (MOBIC) 15 MG tablet TAKE 1 TABLET EACH DAY. 90 tablet 0  . Omega-3 Fatty Acids (FISH OIL) 1000 MG CAPS Take 2 capsules by mouth 2 (two) times daily.    . pantoprazole (PROTONIX) 40 MG tablet Take 1 tablet (40 mg total) by mouth daily. 90 tablet 3  . timolol (TIMOPTIC) 0.5 % ophthalmic solution     . timolol (TIMOPTIC-XR) 0.5 % ophthalmic gel-forming     . vitamin E 400 UNIT capsule Take 400 Units by mouth daily.     No current facility-administered medications for this visit.   Allergies: Sulfa antibiotics, Sulfasalazine, Nabumetone, Naproxen sodium, and Tramadol hcl  No LMP recorded. Patient is postmenopausal.  Past medical history,surgical history, problem list, medications, allergies, family history and social history were all reviewed and documented as reviewed in the EPIC chart.  GYN ROS: no abnormal bleeding, pelvic pain or discharge, no breast pain or new or enlarging lumps on self exam.  No dysuria, urinary frequency, pain with urination, cloudy/malodorous urine.   OBJECTIVE:  BP 126/78 (BP Location: Right Arm, Patient Position:  Sitting, Cuff Size: Normal)   Ht 5' (1.524 m)   Wt 136 lb (61.7 kg)   BMI 26.56 kg/m  The patient appears well, alert, oriented, in no distress.  BREAST EXAM: breasts appear normal, no suspicious masses, no skin or nipple changes or axillary nodes  PELVIC EXAM: VULVA: normal appearing vulva with atrophic change, no masses, tenderness or lesions, VAGINA: second-degree cystocele and rectocele, first-degree uterine prolapse, normal vaginal epithelium color and no discharge, no lesions, CERVIX: normal appearing atrophic cervix without discharge or lesions, UTERUS: uterus is normal size, shape, consistency and nontender, ADNEXA: normal adnexa in size, nontender and no masses, RECTAL: normal rectal, no masses  Chaperone: Wandra Scot Bonham present during the examination  ASSESSMENT:  86 y.o. [id number] here for a breast and pelvic exam  PLAN:   1. Postmenopausal.  No significant hot flashes or night sweats.  No vaginal bleeding. 2. Pap smear 2011.  No history of abnormal Pap smears.  Following the current guidelines we are not continuing with screening and she is comfortable with this. 3.  Pelvic relaxation/prolapse with cystocele, rectocele, uterine prolapse.  She is without significant symptoms and will let us know if that changes. 4. Mammogram 04/2019.  Normal breast exam today.  She is reminded to schedule an annual mammogram this year when due. 5. Colonoscopy 2009.  She will follow up at the interval recommended by her GI specialist/PCP. 6. Osteoporosis.  DEXA 04/2018 T score -3.2 left forearm.  She used bisphosphonates 2003 through 2011.  Started Prolia 2018 which she receives through her primary physician's office. DEXA scans are only measuring the distal third of her radius (bilateral hip replacements and severe degenerative changes of spine exclude analysis of those two sites) so with that limited screening she has decided to not continue with DEXA and will just continue on the Prolia for now.   She will continue to follow up with them in regard to bone health.   7. Health maintenance.  No labs today as she normally has these completed with her primary care doctor.  Return annually or sooner, prn.  Joseph Pierini MD 03/23/20

## 2020-03-27 ENCOUNTER — Ambulatory Visit (INDEPENDENT_AMBULATORY_CARE_PROVIDER_SITE_OTHER): Payer: Medicare PPO

## 2020-03-27 DIAGNOSIS — Z0000 Encounter for general adult medical examination without abnormal findings: Secondary | ICD-10-CM

## 2020-03-27 NOTE — Patient Instructions (Addendum)
Leah [name] , Thank you for taking time to come for your Medicare Wellness Visit. I appreciate your ongoing commitment to your health goals. Please review the following plan we discussed and let me know if I can assist you in the future.   Screening recommendations/referrals: Colonoscopy: no repeat due to age 86: 04/04/2019 Bone Density: 04/05/2018 Recommended yearly ophthalmology/optometry visit for glaucoma screening and checkup Recommended yearly dental visit for hygiene and checkup  Vaccinations: Influenza vaccine: 03/18/2020 Pneumococcal vaccine: up to date Tdap vaccine: 09/24/2011 Shingles vaccine: never done   Covid-19: up to date  Advanced directives: Please bring a copy of your health care power of attorney and living will to the office at your convenience.  Conditions/risks identified: Yes. Reviewed health maintenance screenings with patient today and relevant education, vaccines, and/or referrals were provided. Continue doing brain stimulating activities (puzzles, reading, adult coloring books, staying active) to keep memory sharp. Continue to eat heart healthy diet (full of fruits, vegetables, whole grains, lean protein, water--limit salt, fat, and sugar intake) and increase physical activity as tolerated.  Next appointment: Please schedule your next Medicare Wellness Visit with your Nurse Health Advisor in 1 year by calling [phone].  Preventive Care 26 Years and Older, Female Preventive care refers to lifestyle choices and visits with your health care provider that can promote health and wellness. What does preventive care include?  A yearly physical exam. This is also called an annual well check.  Dental exams once or twice a year.  Routine eye exams. Ask your health care provider how often you should have your eyes checked.  Personal lifestyle choices, including:  Daily care of your teeth and gums.  Regular physical activity.  Eating a healthy  diet.  Avoiding tobacco and drug use.  Limiting alcohol use.  Practicing safe sex.  Taking low-dose aspirin every day.  Taking vitamin and mineral supplements as recommended by your health care provider. What happens during an annual well check? The services and screenings done by your health care provider during your annual well check will depend on your age, overall health, lifestyle risk factors, and family history of disease. Counseling  Your health care provider may ask you questions about your:  Alcohol use.  Tobacco use.  Drug use.  Emotional well-being.  Home and relationship well-being.  Sexual activity.  Eating habits.  History of falls.  Memory and ability to understand (cognition).  Work and work Statistician.  Reproductive health. Screening  You may have the following tests or measurements:  Height, weight, and BMI.  Blood pressure.  Lipid and cholesterol levels. These may be checked every 5 years, or more frequently if you are over 41 years old.  Skin check.  Lung cancer screening. You may have this screening every year starting at age 72 if you have a 30-pack-year history of smoking and currently smoke or have quit within the past 15 years.  Fecal occult blood test (FOBT) of the stool. You may have this test every year starting at age 52.  Flexible sigmoidoscopy or colonoscopy. You may have a sigmoidoscopy every 5 years or a colonoscopy every 10 years starting at age 18.  Hepatitis C blood test.  Hepatitis B blood test.  Sexually transmitted disease (STD) testing.  Diabetes screening. This is done by checking your blood sugar (glucose) after you have not eaten for a while (fasting). You may have this done every 1-3 years.  Bone density scan. This is done to screen for osteoporosis. You may have this  done starting at age 75.  Mammogram. This may be done every 1-2 years. Talk to your health care provider about how often you should have  regular mammograms. Talk with your health care provider about your test results, treatment options, and if necessary, the need for more tests. Vaccines  Your health care provider may recommend certain vaccines, such as:  Influenza vaccine. This is recommended every year.  Tetanus, diphtheria, and acellular pertussis (Tdap, Td) vaccine. You may need a Td booster every 10 years.  Zoster vaccine. You may need this after age 39.  Pneumococcal 13-valent conjugate (PCV13) vaccine. One dose is recommended after age 43.  Pneumococcal polysaccharide (PPSV23) vaccine. One dose is recommended after age 17. Talk to your health care provider about which screenings and vaccines you need and how often you need them. This information is not intended to replace advice given to you by your health care provider. Make sure you discuss any questions you have with your health care provider. Document Released: 06/15/2015 Document Revised: 02/06/2016 Document Reviewed: 03/20/2015 Elsevier Interactive Patient Education  2017 [place] Prevention in the Home Falls can cause injuries. They can happen to people of all ages. There are many things you can do to make your home safe and to help prevent falls. What can I do on the outside of my home?  Regularly fix the edges of walkways and driveways and fix any cracks.  Remove anything that might make you trip as you walk through a door, such as a raised step or threshold.  Trim any bushes or trees on the path to your home.  Use bright outdoor lighting.  Clear any walking paths of anything that might make someone trip, such as rocks or tools.  Regularly check to see if handrails are loose or broken. Make sure that both sides of any steps have handrails.  Any raised decks and porches should have guardrails on the edges.  Have any leaves, snow, or ice cleared regularly.  Use sand or salt on walking paths during winter.  Clean up any spills in your  garage right away. This includes oil or grease spills. What can I do in the bathroom?  Use night lights.  Install grab bars by the toilet and in the tub and shower. Do not use towel bars as grab bars.  Use non-skid mats or decals in the tub or shower.  If you need to sit down in the shower, use a plastic, non-slip stool.  Keep the floor dry. Clean up any water that spills on the floor as soon as it happens.  Remove soap buildup in the tub or shower regularly.  Attach bath mats securely with double-sided non-slip rug tape.  Do not have throw rugs and other things on the floor that can make you trip. What can I do in the bedroom?  Use night lights.  Make sure that you have a light by your bed that is easy to reach.  Do not use any sheets or blankets that are too big for your bed. They should not hang down onto the floor.  Have a firm chair that has side arms. You can use this for support while you get dressed.  Do not have throw rugs and other things on the floor that can make you trip. What can I do in the kitchen?  Clean up any spills right away.  Avoid walking on wet floors.  Keep items that you use a lot in easy-to-reach places.  If you need to reach something above you, use a strong step stool that has a grab bar.  Keep electrical cords out of the way.  Do not use floor polish or wax that makes floors slippery. If you must use wax, use non-skid floor wax.  Do not have throw rugs and other things on the floor that can make you trip. What can I do with my stairs?  Do not leave any items on the stairs.  Make sure that there are handrails on both sides of the stairs and use them. Fix handrails that are broken or loose. Make sure that handrails are as long as the stairways.  Check any carpeting to make sure that it is firmly attached to the stairs. Fix any carpet that is loose or worn.  Avoid having throw rugs at the top or bottom of the stairs. If you do have throw  rugs, attach them to the floor with carpet tape.  Make sure that you have a light switch at the top of the stairs and the bottom of the stairs. If you do not have them, ask someone to add them for you. What else can I do to help prevent falls?  Wear shoes that:  Do not have high heels.  Have rubber bottoms.  Are comfortable and fit you well.  Are closed at the toe. Do not wear sandals.  If you use a stepladder:  Make sure that it is fully opened. Do not climb a closed stepladder.  Make sure that both sides of the stepladder are locked into place.  Ask someone to hold it for you, if possible.  Clearly mark and make sure that you can see:  Any grab bars or handrails.  First and last steps.  Where the edge of each step is.  Use tools that help you move around (mobility aids) if they are needed. These include:  Canes.  Walkers.  Scooters.  Crutches.  Turn on the lights when you go into a dark area. Replace any light bulbs as soon as they burn out.  Set up your furniture so you have a clear path. Avoid moving your furniture around.  If any of your floors are uneven, fix them.  If there are any pets around you, be aware of where they are.  Review your medicines with your doctor. Some medicines can make you feel dizzy. This can increase your chance of falling. Ask your doctor what other things that you can do to help prevent falls. This information is not intended to replace advice given to you by your health care provider. Make sure you discuss any questions you have with your health care provider. Document Released: 03/15/2009 Document Revised: 10/25/2015 Document Reviewed: 06/23/2014 Elsevier Interactive Patient Education  2017 [employer].

## 2020-03-27 NOTE — Progress Notes (Addendum)
I connected with Leah [name] today by telephone and verified that I am speaking with the correct person using two identifiers. Location patient: home Location provider: work Persons participating in the virtual visit: Leah [name] and [place], LPN.   I discussed the limitations, risks, security and privacy concerns of performing an evaluation and management service by telephone and the availability of in person appointments. I also discussed with the patient that there may be a patient responsible charge related to this service. The patient expressed understanding and verbally consented to this telephonic visit.    Interactive audio and video telecommunications were attempted between this provider and patient, however failed, due to patient having technical difficulties OR patient did not have access to video capability.  We continued and completed visit with audio only.  Some vital signs may be absent or patient reported.   Time Spent with patient on telephone encounter: 20 minutes  Subjective:   Leah [name] is a 86 y.o. female who presents for Medicare Annual (Subsequent) preventive examination.  Review of Systems    NO ROS. Medicare Wellness Visit. Cardiac Risk Factors include: advanced age (>47men, >9 women);dyslipidemia;family history of premature cardiovascular disease     Objective:    Today's Vitals   03/27/20 1128  PainSc: 2    There is no height or weight on file to calculate BMI.  Advanced Directives 03/27/2020 03/08/2019 02/04/2018 11/01/2015 10/13/2011 10/13/2011 10/06/2011  Does Patient Have a Medical Advance Directive? Yes Yes No Yes Patient has advance directive, copy not in chart - Patient has advance directive, copy not in chart  Type of Advance Directive [place];Living will [place];Living will - [place];Living will [place];Living will - [place];Living will  Does patient want to make changes to medical advance directive? No - Patient declined - - No - Patient declined - - -  Copy of [place] in Chart? No - copy requested No - copy requested - Yes Copy requested from other (Comment) - (No Data)  Would patient like information on creating a medical advance directive? - - No - Patient declined - - - -  Pre-existing out of facility DNR order (yellow form or pink MOST form) - - - - No No -    Current Medications (verified) Outpatient Encounter Medications as of 03/27/2020  Medication Sig   acetaminophen (TYLENOL) 500 MG tablet Take 1,000 mg by mouth every 6 (six) hours as needed.   Ascorbic Acid (VITAMIN C) 1000 MG tablet Take 1,000 mg by mouth daily.   CALCIUM-MAGNESIUM-ZINC PO Take by mouth.   cholecalciferol (VITAMIN D) 1000 UNITS tablet Take 1,000 Units by mouth daily.   denosumab (PROLIA) 60 MG/ML SOLN injection Inject 60 mg into the skin every 6 (six) months. Administer in upper arm, thigh, or abdomen   glucosamine-chondroitin 500-400 MG tablet Take 1 tablet by mouth 3 (three) times daily.   latanoprost (XALATAN) 0.005 % ophthalmic solution Place 1 drop into both eyes at bedtime.    meloxicam (MOBIC) 15 MG tablet TAKE 1 TABLET EACH DAY.   Omega-3 Fatty Acids (FISH OIL) 1000 MG CAPS Take 2 capsules by mouth 2 (two) times daily.   pantoprazole (PROTONIX) 40 MG tablet Take 1 tablet (40 mg total) by mouth daily.   timolol (TIMOPTIC) 0.5 % ophthalmic solution    timolol (TIMOPTIC-XR) 0.5 % ophthalmic gel-forming    vitamin E 400 UNIT capsule Take 400 Units  by mouth daily.   No facility-administered encounter medications on file as of 03/27/2020.    Allergies (verified) Sulfa antibiotics, Sulfasalazine, Nabumetone, Naproxen sodium, and Tramadol hcl   History: Past Medical History:  Diagnosis Date   Acute maxillary sinusitis    Allergy    Bundle branch block, unspecified    EKG 4/13 with clearance  and note that isnt new block Dr Arnoldo Morale on chart   Carpal tunnel syndrome    Cystocele    Detrusor instability    GERD (gastroesophageal reflux disease)    Glaucoma    Hyperlipidemia    Localized osteoarthrosis not specified whether primary or secondary, lower leg    Osteoporosis 01/2014   T score -2.5 UD forarm, stable at the spine recommend 2 year follow up DEXA   PONV (postoperative nausea and vomiting)    19 yrs ago- states has had general anesthesia since and did well   Unspecified adverse effect of unspecified drug, medicinal and biological substance    Uterine prolapse    Past Surgical History:  Procedure Laterality Date   arthroscopic r knee     CARPAL TUNNEL RELEASE  2011   DILATION AND CURETTAGE OF UTERUS  1982   EYE SURGERY     bilateral cataract extraction  with IOL   FLEXIBLE SIGMOIDOSCOPY     HYSTEROSCOPY     HYSTEROSCOPY WITH D & C  2005 and 2008   JOINT REPLACEMENT     bilateral total hip arthroplasty   total hip rerplacement bilateral     TOTAL KNEE ARTHROPLASTY  10/13/2011   Procedure: TOTAL KNEE ARTHROPLASTY;  Surgeon: Gearlean Alf, MD;  Location: [facility];  Service: Orthopedics;  Laterality: Right;   UPPER GI ENDOSCOPY  07/2018 and 3/20   Family History  Problem Relation Age of Onset   Dementia Mother    Breast cancer Mother 25   Heart disease Father    Stroke Father    Dementia Sister    Colon cancer Neg Hx    Stomach cancer Neg Hx    Rectal cancer Neg Hx    Esophageal cancer Neg Hx    Colon polyps Neg Hx    Pancreatic cancer Neg Hx    Social History   Socioeconomic History   Marital status: Married    Spouse name: Not on file   Number of children: 2   Years of education: Not on file   Highest education level: Not on file  Occupational History   Occupation: retired  Tobacco Use   Smoking status: Former Smoker    Quit date: 10/06/1958    Years since quitting: 61.5   Smokeless tobacco: Never Used  Scientific laboratory technician Use: Never used    Substance and Sexual Activity   Alcohol use: Yes    Comment: once a year   Drug use: No   Sexual activity: Never    Comment: 1st intercourse 61 yo-1 partner  Other Topics Concern   Not on file  Social History Narrative   Not on file   Social Determinants of Health   Financial Resource Strain: Low Risk    Difficulty of Paying Living Expenses: Not hard at all  Food Insecurity: No Food Insecurity   Worried About Charity fundraiser in the Last Year: Never true   Ran Out of Food in the Last Year: Never true  Transportation Needs: No Transportation Needs   Lack of Transportation (Medical): No   Lack of Transportation (Non-Medical): No  Physical Activity: Inactive   Days of Exercise per Week: 0 days   Minutes of Exercise per Session: 0 min  Stress: No Stress Concern Present   Feeling of Stress : Not at all  Social Connections:    Frequency of Communication with Friends and Family: Not on file   Frequency of Social Gatherings with Friends and Family: Not on file   Attends Religious Services: Not on Electrical engineer or Organizations: Not on file   Attends Archivist Meetings: Not on file   Marital Status: Not on file    Tobacco Counseling Counseling given: Not Answered   Clinical Intake:  Pre-visit preparation completed: Yes  Pain : 0-10 Pain Score: 2  Pain Type: Acute pain Pain Location: Ankle Pain Orientation: Right Pain Radiating Towards: none Pain Descriptors / Indicators: Aching, Discomfort Pain Onset: In the past 7 days Pain Frequency: Intermittent     Nutritional Risks: None Diabetes: No  How often do you need to have someone help you when you read instructions, pamphlets, or other written materials from your doctor or pharmacy?: 1 - Never What is the last grade level you completed in school?: 1 year of college  Diabetic? no  Interpreter Needed?: No  Information entered by :: Lisette Abu, LPN   Activities of Daily  Living In your present state of health, do you have any difficulty performing the following activities: 03/27/2020  Hearing? N  Vision? N  Difficulty concentrating or making decisions? N  Walking or climbing stairs? N  Dressing or bathing? N  Doing errands, shopping? N  Preparing Food and eating ? N  Using the Toilet? N  In the past six months, have you accidently leaked urine? N  Do you have problems with loss of bowel control? N  Managing your Medications? N  Managing your Finances? N  Housekeeping or managing your Housekeeping? N  Some recent data might be hidden    Patient Care Team: Hoyt Koch, MD as PCP - General (Internal Medicine) Fontaine, Belinda Block, MD (Inactive) as Consulting Physician (Gynecology) Marygrace Drought, MD as Consulting Physician (Ophthalmology) Ladene Artist, MD as Consulting Physician (Gastroenterology)  Indicate any recent Medical Services you may have received from other than [facility] providers in the past year (date may be approximate).     Assessment:   This is a routine wellness examination for Lilliann.  Hearing/Vision screen No exam data present  Dietary issues and exercise activities discussed: Current Exercise Habits: The patient does not participate in regular exercise at present, Exercise limited by: orthopedic condition(s)  Goals      Patient Stated     Maintain current health status and stay physically and socially active.       Depression Screen PHQ 2/9 Scores 03/27/2020 03/08/2019 07/16/2018 05/21/2017 05/21/2017 11/01/2015 07/04/2015  PHQ - 2 Score 0 1 0 0 0 0 0    Fall Risk Fall Risk  03/27/2020 03/08/2019 07/16/2018 05/21/2017 11/01/2015  Falls in the past year? 1 0 0 No No  Number falls in past yr: 0 0 - - -  Injury with Fall? 0 0 - - -  Risk for fall due to : Impaired balance/gait - - - -  Follow up Falls evaluation completed Falls prevention discussed - - -    Any stairs in or around the home? Yes  If so, are there  any without handrails? No  Home free of loose throw rugs in walkways, pet beds, electrical cords,  etc? Yes  Adequate lighting in your home to reduce risk of falls? Yes   ASSISTIVE DEVICES UTILIZED TO PREVENT FALLS:  Life alert? No  Use of a cane, walker or w/c? Yes  Grab bars in the bathroom? Yes  Shower chair or bench in shower? No  Elevated toilet seat or a handicapped toilet? No   TIMED UP AND GO:  Was the test performed? No .  Length of time to ambulate 10 feet: 0 sec.   Gait slow and steady with assistive device  Cognitive Function: MMSE - Mini Mental State Exam 03/08/2019  Not completed: Refused        Immunizations Immunization History  Administered Date(s) Administered   Fluad Quad(high Dose 65+) 02/22/2019   Influenza Split 03/13/2011   Influenza Whole 06/02/2004, 04/02/2007, 02/24/2008, 03/30/2009, 02/14/2010   Influenza, High Dose Seasonal PF 03/06/2014, 02/03/2018   Influenza,inj,Quad PF,6+ Mos 03/12/2017   Influenza-Unspecified 03/11/2013, 03/03/2015   PFIZER SARS-COV-2 Vaccination 06/21/2019, 07/11/2019   Pneumococcal Conjugate-13 03/09/2019   Pneumococcal Polysaccharide-23 06/02/2005, 09/08/2013   Td 06/02/2001, 09/24/2011    TDAP status: Up to date Flu Vaccine status: Up to date Pneumococcal vaccine status: Up to date Covid-19 vaccine status: Completed vaccines  Qualifies for Shingles Vaccine? Yes   Zostavax completed Yes   Shingrix Completed?: No.    Education has been provided regarding the importance of this vaccine. Patient has been advised to call insurance company to determine out of pocket expense if they have not yet received this vaccine. Advised may also receive vaccine at local pharmacy or Health Dept. Verbalized acceptance and understanding.  Screening Tests Health Maintenance  Topic Date Due   INFLUENZA VACCINE  01/01/2020   TETANUS/TDAP  09/23/2021   DEXA SCAN  Completed   COVID-19 Vaccine  Completed   PNA vac Low Risk Adult   Completed    Health Maintenance  Health Maintenance Due  Topic Date Due   INFLUENZA VACCINE  01/01/2020    Colorectal cancer screening: No longer required.  Mammogram status: Completed 04/04/2019. Repeat every year Bone Density status: Completed 04/05/2018. Results reflect: Bone density results: OSTEOPOROSIS. Repeat every 2 years.  Lung Cancer Screening: (Low Dose CT Chest recommended if Age 10-80 years, 30 pack-year currently smoking OR have quit w/in 15years.) does not qualify.   Lung Cancer Screening Referral: no  Additional Screening:  Hepatitis C Screening: does not qualify; Completed no  Vision Screening: Recommended annual ophthalmology exams for early detection of glaucoma and other disorders of the eye. Is the patient up to date with their annual eye exam?  Yes  Who is the provider or what is the name of the office in which the patient attends annual eye exams? Marygrace Drought, MD If pt is not established with a provider, would they like to be referred to a provider to establish care? No .   Dental Screening: Recommended annual dental exams for proper oral hygiene  Community Resource Referral / Chronic Care Management: CRR required this visit?  No   CCM required this visit?  No      Plan:     I have personally reviewed and noted the following in the patient's chart:   Medical and social history Use of alcohol, tobacco or illicit drugs  Current medications and supplements Functional ability and status Nutritional status Physical activity Advanced directives List of other physicians Hospitalizations, surgeries, and ER visits in previous 12 months Vitals Screenings to include cognitive, depression, and falls Referrals and appointments  In addition, I  have reviewed and discussed with patient certain preventive protocols, quality metrics, and best practice recommendations. A written personalized care plan for preventive services as well as general preventive  health recommendations were provided to patient.     Sheral Flow, LPN   [id number]/[id number]   Nurse Notes:  Patient is cogitatively intact. There were no vitals filed for this visit. There is no height or weight on file to calculate BMI.    Medical screening examination/treatment/procedure(s) were performed by non-physician practitioner and as supervising physician I was immediately available for consultation/collaboration.  I agree with above. Lew Dawes, MD

## 2020-03-28 ENCOUNTER — Ambulatory Visit: Payer: Medicare PPO | Admitting: Family

## 2020-03-28 ENCOUNTER — Encounter: Payer: Self-pay | Admitting: Family

## 2020-03-28 ENCOUNTER — Ambulatory Visit (INDEPENDENT_AMBULATORY_CARE_PROVIDER_SITE_OTHER): Payer: Medicare PPO

## 2020-03-28 ENCOUNTER — Other Ambulatory Visit: Payer: Self-pay

## 2020-03-28 VITALS — BP 160/84 | HR 69 | Temp 98.2°F | Ht 60.0 in | Wt 136.0 lb

## 2020-03-28 DIAGNOSIS — M25552 Pain in left hip: Secondary | ICD-10-CM | POA: Diagnosis not present

## 2020-03-28 DIAGNOSIS — R600 Localized edema: Secondary | ICD-10-CM

## 2020-03-28 MED ORDER — ACETAMINOPHEN-CODEINE #3 300-30 MG PO TABS: 1.0000 | ORAL_TABLET | Freq: Four times a day (QID) | ORAL | 0 refills | Status: DC | PRN

## 2020-03-28 NOTE — Progress Notes (Signed)
Leah [name] is a 86 y.o. female with the following history as recorded in Epic[facility]:  Patient Active Problem List   Diagnosis Date Noted  . Itching 08/17/2018  . Elevated blood pressure reading in office without diagnosis of hypertension 01/15/2018  . Basal cell carcinoma (BCC) of neck 05/21/2017  . IBS (irritable bowel syndrome) 01/29/2016  . Routine general medical examination at a health care facility 11/01/2015  . Other fatigue 10/04/2014  . Left bundle branch block (LBBB) on electrocardiogram 10/04/2014  . DJD (degenerative joint disease) 09/09/2013  . Dupuytren's contracture of both hands 03/04/2013  . GLAUCOMA STAGE UNSPECIFIED 05/16/2010  . Allergic rhinitis 07/05/2009  . GERD 09/14/2008  . Hyperlipidemia with target LDL less than 130 02/18/2007  . Osteoporosis 02/15/2007    Current Outpatient Medications  Medication Sig Dispense Refill  . acetaminophen (TYLENOL) 500 MG tablet Take 1,000 mg by mouth every 6 (six) hours as needed.    . Ascorbic Acid (VITAMIN C) 1000 MG tablet Take 1,000 mg by mouth daily.    Marland Kitchen CALCIUM-MAGNESIUM-ZINC PO Take by mouth.    . cholecalciferol (VITAMIN D) 1000 UNITS tablet Take 1,000 Units by mouth daily.    Marland Kitchen denosumab (PROLIA) 60 MG/ML SOLN injection Inject 60 mg into the skin every 6 (six) months. Administer in upper arm, thigh, or abdomen    . glucosamine-chondroitin 500-400 MG tablet Take 1 tablet by mouth 3 (three) times daily.    Marland Kitchen latanoprost (XALATAN) 0.005 % ophthalmic solution Place 1 drop into both eyes at bedtime.     . meloxicam (MOBIC) 15 MG tablet TAKE 1 TABLET EACH DAY. 90 tablet 0  . Omega-3 Fatty Acids (FISH OIL) 1000 MG CAPS Take 2 capsules by mouth 2 (two) times daily.    . pantoprazole (PROTONIX) 40 MG tablet Take 1 tablet (40 mg total) by mouth daily. 90 tablet 3  . timolol (TIMOPTIC) 0.5 % ophthalmic solution     . timolol (TIMOPTIC-XR) 0.5 % ophthalmic gel-forming     . vitamin E 400 UNIT capsule Take 400 Units by  mouth daily.    Marland Kitchen acetaminophen-codeine (TYLENOL #3) 300-30 MG tablet Take 1 tablet by mouth every 6 (six) hours as needed for moderate pain. 30 tablet 0   No current facility-administered medications for this visit.    Allergies: Sulfa antibiotics, Sulfasalazine, Nabumetone, Naproxen sodium, and Tramadol hcl  Past Medical History:  Diagnosis Date  . Acute maxillary sinusitis   . Allergy   . Bundle branch block, unspecified    EKG 4/13 with clearance and note that isnt new block Dr Arnoldo Morale on chart  . Carpal tunnel syndrome   . Cystocele   . Detrusor instability   . GERD (gastroesophageal reflux disease)   . Glaucoma   . Hyperlipidemia   . Localized osteoarthrosis not specified whether primary or secondary, lower leg   . Osteoporosis 01/2014   T score -2.5 UD forarm, stable at the spine recommend 2 year follow up DEXA  . PONV (postoperative nausea and vomiting)    19 yrs ago- states has had general anesthesia since and did well  . Unspecified adverse effect of unspecified drug, medicinal and biological substance   . Uterine prolapse     Past Surgical History:  Procedure Laterality Date  . arthroscopic r knee    . CARPAL TUNNEL RELEASE  2011  . DILATION AND CURETTAGE OF UTERUS  1982  . EYE SURGERY     bilateral cataract extraction  with IOL  . FLEXIBLE  SIGMOIDOSCOPY    . HYSTEROSCOPY    . HYSTEROSCOPY WITH D & C  2005 and 2008  . JOINT REPLACEMENT     bilateral total hip arthroplasty  . total hip rerplacement bilateral    . TOTAL KNEE ARTHROPLASTY  10/13/2011   Procedure: TOTAL KNEE ARTHROPLASTY;  Surgeon: Gearlean Alf, MD;  Location: [facility];  Service: Orthopedics;  Laterality: Right;  . UPPER GI ENDOSCOPY  07/2018 and 3/20    Family History  Problem Relation Age of Onset  . Dementia Mother   . Breast cancer Mother 41  . Heart disease Father   . Stroke Father   . Dementia Sister   . Colon cancer Neg Hx   . Stomach cancer Neg Hx   . Rectal cancer Neg Hx   .  Esophageal cancer Neg Hx   . Colon polyps Neg Hx   . Pancreatic cancer Neg Hx     Social History   Tobacco Use  . Smoking status: Former Smoker    Quit date: 10/06/1958    Years since quitting: 61.5  . Smokeless tobacco: Never Used  Substance Use Topics  . Alcohol use: Yes    Comment: once a year    Subjective:  Patient notes that she tripped over her dog last week while working in the kitchen; was unable to get up from the floor and actually had to call 911 to get help; having some persisting hip pain- history of hip replacement/ wants to make sure hardware is intact; asking for something stronger than Tylenol- cannot take NSAIDs; has used codeine in the past;   Right ankle/ foot swelling at night x 6 months; "more normal" in the morning and swells as the day progresses; no pain or swelling in calf;      Objective:  Vitals:   03/28/20 1132  BP: (!) 160/84  Pulse: 69  Temp: 98.2 F (36.8 C)  TempSrc: Oral  SpO2: 96%  Weight: 136 lb (61.7 kg)  Height: 5' (1.524 m)    General: Well developed, well nourished, in no acute distress  Skin : Warm and dry.  Head: Normocephalic and atraumatic  Eyes: Sclera and conjunctiva clear; pupils round and reactive to light; extraocular movements intact  Ears: External normal; canals clear; tympanic membranes normal  Oropharynx: Pink, supple. No suspicious lesions  Neck: Supple without thyromegaly, adenopathy  Lungs: Respirations unlabored;  Musculoskeletal: No deformities; no active joint inflammation  Extremities: bilateral pedal edema, no cyanosis, no clubbing  Vessels: Symmetric bilaterally  Neurologic: Alert and oriented; speech intact; face symmetrical; moves all extremities well; CNII-XII intact without focal deficit   Assessment:  1. Left hip pain   2. Pedal edema     Plan:  1. Update X-ray today; Rx for Tylenol #3 per patient request/ agree this reasonable; to consider referral for PT and/or back to orthopedist; 2. Update  doppler; discussed need to elevate legs and can use compression stockings which she has at home;   Time spent 30 minutes;  This visit occurred during the SARS-CoV-2 public health emergency.  Safety protocols were in place, including screening questions prior to the visit, additional usage of staff PPE, and extensive cleaning of exam room while observing appropriate contact time as indicated for disinfecting solutions.     No follow-ups on file.  Orders Placed This Encounter  Procedures  . DG Hip Unilat W OR W/O Pelvis 2-3 Views Left    Standing Status:   Future    Number of  Occurrences:   1    Standing Expiration Date:   03/28/2021    Order Specific Question:   Reason for Exam (SYMPTOM  OR DIAGNOSIS REQUIRED)    Answer:   left hip pain    Order Specific Question:   Preferred imaging location?    Answer:   [facility]    Requested Prescriptions   Signed Prescriptions Disp Refills  . acetaminophen-codeine (TYLENOL #3) 300-30 MG tablet 30 tablet 0    Sig: Take 1 tablet by mouth every 6 (six) hours as needed for moderate pain.

## 2020-03-29 ENCOUNTER — Ambulatory Visit (HOSPITAL_COMMUNITY)
Admission: RE | Admit: 2020-03-29 | Discharge: 2020-03-29 | Disposition: A | Discharge status: Home / Self Care | Payer: Medicare PPO | Source: Ambulatory Visit | Attending: Cardiovascular Disease | Admitting: Cardiovascular Disease

## 2020-03-29 DIAGNOSIS — R600 Localized edema: Secondary | ICD-10-CM | POA: Diagnosis not present

## 2020-03-30 ENCOUNTER — Telehealth: Payer: Self-pay | Admitting: Internal Medicine

## 2020-03-30 ENCOUNTER — Other Ambulatory Visit: Payer: Self-pay | Admitting: Family

## 2020-03-30 DIAGNOSIS — R2689 Other abnormalities of gait and mobility: Secondary | ICD-10-CM

## 2020-03-30 NOTE — Telephone Encounter (Signed)
Patient was seen by Mickel Baas on 10.27.21 and spoke to her about physical therapy and the patient has decided she wants to follow through with doing that and would like some more information.

## 2020-04-02 ENCOUNTER — Telehealth: Payer: Self-pay | Admitting: Family

## 2020-04-02 NOTE — Telephone Encounter (Signed)
I had put in the referral for her to see PT as discussed. She should be contacted about that referral.

## 2020-04-09 DIAGNOSIS — Z1231 Encounter for screening mammogram for malignant neoplasm of breast: Secondary | ICD-10-CM | POA: Diagnosis not present

## 2020-04-17 DIAGNOSIS — M25551 Pain in right hip: Secondary | ICD-10-CM | POA: Diagnosis not present

## 2020-04-17 DIAGNOSIS — M545 Low back pain, unspecified: Secondary | ICD-10-CM | POA: Diagnosis not present

## 2020-04-18 ENCOUNTER — Ambulatory Visit: Payer: Medicare PPO | Admitting: Physical Therapy

## 2020-04-24 DIAGNOSIS — M5416 Radiculopathy, lumbar region: Secondary | ICD-10-CM | POA: Diagnosis not present

## 2020-05-03 DIAGNOSIS — M5416 Radiculopathy, lumbar region: Secondary | ICD-10-CM | POA: Insufficient documentation

## 2020-05-04 ENCOUNTER — Ambulatory Visit: Payer: Medicare PPO | Admitting: Gastroenterology

## 2020-05-04 ENCOUNTER — Encounter: Payer: Self-pay | Admitting: Gastroenterology

## 2020-05-04 VITALS — BP 130/70 | HR 73 | Ht 60.0 in | Wt 135.0 lb

## 2020-05-04 DIAGNOSIS — K2100 Gastro-esophageal reflux disease with esophagitis, without bleeding: Secondary | ICD-10-CM

## 2020-05-04 DIAGNOSIS — R1310 Dysphagia, unspecified: Secondary | ICD-10-CM | POA: Diagnosis not present

## 2020-05-04 NOTE — Progress Notes (Signed)
    History of Present Illness: This is an 86 year old female with a history of GERD with esophagitis and an esophageal stricture.  She underwent esophageal dilation in early 2020 with good results.  Over the past few months she notes increased frequency and severity of solid food dysphagia occurring about twice per month.  She has no active reflux symptoms.  No other gastrointestinal complaints.  Current Medications, Allergies, Past Medical History, Past Surgical History, Family History and Social History were reviewed in [employer] record.   Physical Exam: General: Well developed, well nourished, no acute distress Head: Normocephalic and atraumatic Eyes:  sclerae anicteric, EOMI Ears: Normal auditory acuity Mouth: Not examined, mask on during Covid-19 pandemic Lungs: Clear throughout to auscultation Heart: Regular rate and rhythm; no murmurs, rubs or bruits Abdomen: Soft, non tender and non distended. No masses, hepatosplenomegaly or hernias noted. Normal Bowel sounds Rectal: Not done Musculoskeletal: Symmetrical with no gross deformities  Pulses:  Normal pulses noted Extremities: No clubbing, cyanosis, edema or deformities noted Neurological: Alert oriented x 4, grossly nonfocal Psychological:  Alert and cooperative. Normal mood and affect   Assessment and Recommendations:  1. GERD with Grade D esophagitis and history of an esophageal stricture. Worsening solid food dysphagia.  Continue pantoprazole 40 mg daily.  Follow antireflux measures. Schedule EGD to reevaluate grade D esophagitis and for possible esophageal stricture dilation. She was offered an earlier appointment however she requested her procedure to be scheduled in January or February 2022.  She will call if her symptoms worsen in the interim to consider an earlier procedure date.

## 2020-05-04 NOTE — Patient Instructions (Signed)
You have been scheduled for an endoscopy. Please follow written instructions given to you at your visit today. If you use inhalers (even only as needed), please bring them with you on the day of your procedure.  Thank you for choosing me and [facility].  [name]. [name]., MD., [name]

## 2020-05-05 DIAGNOSIS — M47816 Spondylosis without myelopathy or radiculopathy, lumbar region: Secondary | ICD-10-CM | POA: Diagnosis not present

## 2020-05-15 DIAGNOSIS — M47816 Spondylosis without myelopathy or radiculopathy, lumbar region: Secondary | ICD-10-CM | POA: Diagnosis not present

## 2020-05-15 DIAGNOSIS — M5136 Other intervertebral disc degeneration, lumbar region: Secondary | ICD-10-CM | POA: Diagnosis not present

## 2020-05-15 DIAGNOSIS — M5416 Radiculopathy, lumbar region: Secondary | ICD-10-CM | POA: Diagnosis not present

## 2020-05-21 DIAGNOSIS — M5136 Other intervertebral disc degeneration, lumbar region: Secondary | ICD-10-CM | POA: Diagnosis not present

## 2020-05-21 DIAGNOSIS — M5416 Radiculopathy, lumbar region: Secondary | ICD-10-CM | POA: Diagnosis not present

## 2020-05-21 DIAGNOSIS — M7989 Other specified soft tissue disorders: Secondary | ICD-10-CM | POA: Diagnosis not present

## 2020-05-22 DIAGNOSIS — M5459 Other low back pain: Secondary | ICD-10-CM | POA: Diagnosis not present

## 2020-05-22 DIAGNOSIS — M5416 Radiculopathy, lumbar region: Secondary | ICD-10-CM | POA: Diagnosis not present

## 2020-05-23 ENCOUNTER — Other Ambulatory Visit (HOSPITAL_COMMUNITY): Payer: Self-pay | Admitting: Physical Medicine and Rehabilitation

## 2020-05-23 DIAGNOSIS — M5459 Other low back pain: Secondary | ICD-10-CM

## 2020-05-24 ENCOUNTER — Encounter: Payer: Self-pay | Admitting: Internal Medicine

## 2020-05-24 ENCOUNTER — Telehealth (INDEPENDENT_AMBULATORY_CARE_PROVIDER_SITE_OTHER): Payer: Medicare PPO | Admitting: Internal Medicine

## 2020-05-24 DIAGNOSIS — M25552 Pain in left hip: Secondary | ICD-10-CM

## 2020-05-24 MED ORDER — FUROSEMIDE 20 MG PO TABS: 20.0000 mg | ORAL_TABLET | Freq: Every day | ORAL | 0 refills | Status: DC

## 2020-05-24 MED ORDER — MELOXICAM 15 MG PO TABS
15.0000 mg | ORAL_TABLET | Freq: Every day | ORAL | 0 refills | Status: DC
Start: 2020-05-24 — End: 2020-09-28

## 2020-05-24 NOTE — Assessment & Plan Note (Signed)
S/P replacement and awaiting MRI next week to rule out problems. Has been going on several months now. Rx meloxicam today for pain and continue using tylenol also. Seeing orthopedics already.

## 2020-05-24 NOTE — Progress Notes (Signed)
Virtual Visit via Audio Note  I connected with Leah [name] on 05/24/20 at 10:20 AM EST by an audio-only enabled telemedicine application and verified that I am speaking with the correct person using two identifiers.  The patient and the provider were at separate locations throughout the entire encounter. Patient location: home, Provider location: work   I discussed the limitations of evaluation and management by telemedicine and the availability of in person appointments. The patient expressed understanding and agreed to proceed. The patient and the provider were the only parties present for the visit unless noted in HPI below.  History of Present Illness: The patient is a 86 y.o. female with visit for left hip pain. Had a fall mid-October and left hip pain since then. Started hurting afterwards. She did see provider here after that. Has been using walker. Did take tylenol with codeine and that make her drowsy so she does not take it. Denies additional falls recently. Overall it is not improving. Wants to see Nicholaus Bloom for pain management for the left hip. Had a pain blocker from Dr. Nelva Bush and this helped for only a day or two. She is frustrated with this. She is getting MRI next Tuesday. She is taking meloxicam which is not helping. She has had prior hip replacement on both sides.   Observations/Objective: Voice strong, A and O times 3, no coughing or dyspnea  Assessment and Plan: See problem oriented charting  Follow Up Instructions: Rx meloxicam and wait on MRI. Rx lasix for fluid.  Visit time 14 minutes in non-face to face communication with patient and coordination of care.  I discussed the assessment and treatment plan with the patient. The patient was provided an opportunity to ask questions and all were answered. The patient agreed with the plan and demonstrated an understanding of the instructions.   The patient was advised to call back or seek an in-person evaluation if the  symptoms worsen or if the condition fails to improve as anticipated.  Hoyt Koch, MD

## 2020-05-28 ENCOUNTER — Other Ambulatory Visit: Payer: Self-pay

## 2020-05-28 ENCOUNTER — Ambulatory Visit (HOSPITAL_COMMUNITY)
Admission: RE | Admit: 2020-05-28 | Discharge: 2020-05-28 | Disposition: A | Discharge status: Home / Self Care | Payer: Medicare PPO | Source: Ambulatory Visit | Attending: Physical Medicine and Rehabilitation | Admitting: Physical Medicine and Rehabilitation

## 2020-05-28 DIAGNOSIS — M5459 Other low back pain: Secondary | ICD-10-CM | POA: Insufficient documentation

## 2020-05-28 DIAGNOSIS — M545 Low back pain, unspecified: Secondary | ICD-10-CM | POA: Diagnosis not present

## 2020-06-04 ENCOUNTER — Other Ambulatory Visit (HOSPITAL_COMMUNITY): Payer: Self-pay | Admitting: Interventional Radiology

## 2020-06-04 ENCOUNTER — Telehealth (HOSPITAL_COMMUNITY): Payer: Self-pay

## 2020-06-04 NOTE — Telephone Encounter (Signed)
Called to inform pt that we received her request for a sacroplasty. I have sent a request to [facility] who handles our authorizations for approval. Will call pt back to schedule once I receive approval. AW

## 2020-06-05 ENCOUNTER — Other Ambulatory Visit (HOSPITAL_COMMUNITY): Payer: Self-pay | Admitting: Interventional Radiology

## 2020-06-05 ENCOUNTER — Telehealth (HOSPITAL_COMMUNITY): Payer: Self-pay

## 2020-06-05 ENCOUNTER — Telehealth: Payer: Self-pay | Admitting: Internal Medicine

## 2020-06-05 DIAGNOSIS — M8448XA Pathological fracture, other site, initial encounter for fracture: Secondary | ICD-10-CM

## 2020-06-05 NOTE — Telephone Encounter (Signed)
Called to schedule sacroplasty, no answer, no vm. AW

## 2020-06-05 NOTE — Telephone Encounter (Signed)
° °  Patient calling to report her right foot is weeping. She states the furosemide (LASIX) 20 MG tablet is helping

## 2020-06-05 NOTE — Telephone Encounter (Signed)
Okay to continue lasix daily, not sure if there is a question here?

## 2020-06-06 ENCOUNTER — Other Ambulatory Visit: Payer: Self-pay | Admitting: Student

## 2020-06-06 ENCOUNTER — Other Ambulatory Visit: Payer: Self-pay | Admitting: Radiology

## 2020-06-06 NOTE — Telephone Encounter (Signed)
Pt informed of below.  

## 2020-06-06 NOTE — H&P (Signed)
Chief Complaint: Sacral insufficiency  Referring Physician(s): Sheran Luz  Supervising Physician: Julieanne Cotton  Patient Status: [facility] - Out-pt  History of Present Illness: Leah [name] is a 87 y.o. female who had a fall mid-October.  She has c/o left hip pain since that time.   She has had to use a walker. She rates her pain 10/10 especially with movement.   She tells me she has had to hire someone to help her with her daily activities, house chores, etc due to the severity of her symptoms.  Per chart, she had a pain blocker from Dr. Ethelene Hal and this helped for only a day or two.  MRI done 05/28/20 showed= Abnormal signal within both sacral ala, likely insufficiency fractures of indeterminate age.  Dr. Corliss Skains has reviewed images and feels she is a good candidate for sacroplasty.  She is NPO. No nausea/vomiting. No Fever/chills. ROS negative.   Past Medical History:  Diagnosis Date   Acute maxillary sinusitis    Allergy    Bundle branch block, unspecified    EKG 4/13 with clearance and note that isnt new block Dr Lovell Sheehan on chart   Carpal tunnel syndrome    Cystocele    Detrusor instability    GERD (gastroesophageal reflux disease)    Glaucoma    Hyperlipidemia    Localized osteoarthrosis not specified whether primary or secondary, lower leg    Osteoporosis 01/2014   T score -2.5 UD forarm, stable at the spine recommend 2 year follow up DEXA   PONV (postoperative nausea and vomiting)    19 yrs ago- states has had general anesthesia since and did well   Unspecified adverse effect of unspecified drug, medicinal and biological substance    Uterine prolapse     Past Surgical History:  Procedure Laterality Date   arthroscopic r knee     CARPAL TUNNEL RELEASE  2011   DILATION AND CURETTAGE OF UTERUS  1982   EYE SURGERY     bilateral cataract extraction  with IOL   FLEXIBLE SIGMOIDOSCOPY     HYSTEROSCOPY     HYSTEROSCOPY  WITH D & C  2005 and 2008   JOINT REPLACEMENT     bilateral total hip arthroplasty   total hip rerplacement bilateral     TOTAL KNEE ARTHROPLASTY  10/13/2011   Procedure: TOTAL KNEE ARTHROPLASTY;  Surgeon: Loanne Drilling, MD;  Location: [facility] ORS;  Service: Orthopedics;  Laterality: Right;   UPPER GI ENDOSCOPY  07/2018 and 3/20    Allergies: Sulfa antibiotics, Sulfasalazine, Nabumetone, Naproxen sodium, and Tramadol hcl  Medications: Prior to Admission medications   Medication Sig Start Date End Date Taking? Authorizing Provider  acetaminophen (TYLENOL) 500 MG tablet Take 1,000 mg by mouth every 6 (six) hours as needed.    [provider]  Ascorbic Acid (VITAMIN C) 1000 MG tablet Take 1,000 mg by mouth daily.    [provider]  CALCIUM-MAGNESIUM-ZINC PO Take by mouth.    [provider]  cholecalciferol (VITAMIN D) 1000 UNITS tablet Take 1,000 Units by mouth daily.    [provider]  denosumab (PROLIA) 60 MG/ML SOLN injection Inject 60 mg into the skin every 6 (six) months. Administer in upper arm, thigh, or abdomen    [provider]  furosemide (LASIX) 20 MG tablet Take 1 tablet (20 mg total) by mouth daily. 05/24/20   Myrlene Broker, MD  glucosamine-chondroitin 500-400 MG tablet Take 1 tablet by mouth 3 (three) times  daily.    [provider]  latanoprost (XALATAN) 0.005 % ophthalmic solution Place 1 drop into both eyes at bedtime.     [provider]  meloxicam (MOBIC) 15 MG tablet Take 1 tablet (15 mg total) by mouth daily. 05/24/20   Hoyt Koch, MD  pantoprazole (PROTONIX) 40 MG tablet Take 1 tablet (40 mg total) by mouth daily. 09/23/19   Ladene Artist, MD  timolol (TIMOPTIC) 0.5 % ophthalmic solution  07/20/19   [provider]  timolol (TIMOPTIC-XR) 0.5 % ophthalmic gel-forming  09/27/14   [provider]  vitamin E 400 UNIT capsule Take 400 Units by mouth daily.    [provider]     Family History  Problem Relation Age of Onset   Dementia Mother    Breast cancer Mother 53   Heart disease Father    Stroke Father    Dementia Sister    Colon cancer Neg Hx    Stomach cancer Neg Hx    Rectal cancer Neg Hx    Esophageal cancer Neg Hx    Colon polyps Neg Hx    Pancreatic cancer Neg Hx     Social History   Socioeconomic History   Marital status: Married    Spouse name: Not on file   Number of children: 2   Years of education: Not on file   Highest education level: Not on file  Occupational History   Occupation: retired  Tobacco Use   Smoking status: Former Smoker    Quit date: 10/06/1958    Years since quitting: 61.7   Smokeless tobacco: Never Used  Scientific laboratory technician Use: Never used  Substance and Sexual Activity   Alcohol use: Yes    Comment: once a year   Drug use: No   Sexual activity: Never    Comment: 1st intercourse 11 yo-1 partner  Other Topics Concern   Not on file  Social History Narrative   Not on file   Social Determinants of Health   Financial Resource Strain: Low Risk    Difficulty of Paying Living Expenses: Not hard at all  Food Insecurity: No Food Insecurity   Worried About Charity fundraiser in the Last Year: Never true   [place] in the Last Year: Never true  Transportation Needs: No Transportation Needs   Lack of Transportation (Medical): No   Lack of Transportation (Non-Medical): No  Physical Activity: Inactive   Days of Exercise per Week: 0 days   Minutes of Exercise per Session: 0 min  Stress: No Stress Concern Present   Feeling of Stress : Not at all  Social Connections: Not on file     Review of Systems: A 12 point ROS discussed and pertinent positives are indicated in the HPI above.  All other systems are negative.  Review of Systems  Vital Signs: There were no vitals taken for this visit.  Physical Exam Vitals reviewed.  Constitutional:       Appearance: Normal appearance.  HENT:     Head: Normocephalic and atraumatic.  Eyes:     Extraocular Movements: Extraocular movements intact.  Cardiovascular:     Rate and Rhythm: Normal rate and regular rhythm.  Pulmonary:     Effort: Pulmonary effort is normal. No respiratory distress.     Breath sounds: Normal breath sounds.  Abdominal:     Palpations: Abdomen is soft.  Musculoskeletal:        General: Normal  range of motion.  Skin:    General: Skin is warm and dry.  Neurological:     General: No focal deficit present.     Mental Status: She is alert and oriented to person, place, and time.  Psychiatric:        Mood and Affect: Mood normal.        Behavior: Behavior normal.        Thought Content: Thought content normal.        Judgment: Judgment normal.     Imaging: MR LUMBAR SPINE WO CONTRAST  Result Date: 05/28/2020 CLINICAL DATA:  Low back pain radiating to the left hip EXAM: MRI LUMBAR SPINE WITHOUT CONTRAST TECHNIQUE: Multiplanar, multisequence MR imaging of the lumbar spine was performed. No intravenous contrast was administered. COMPARISON:  None. FINDINGS: Segmentation:  Standard Alignment: Grade 1 retrolisthesis at L1-2 and L2-3 with grade 1 anterolisthesis at L3-4 and L4-5. Right convex asymmetry. Vertebrae:  No fracture, evidence of discitis, or bone lesion. Conus medullaris and cauda equina: Conus extends to the L1 level. Conus and cauda equina appear normal. Paraspinal and other soft tissues: Negative Disc levels: T12-L1: Left asymmetric disc bulge with mild facet arthrosis. No spinal canal stenosis. Severe left foraminal stenosis. L1-L2: Disc bulge with endplate spurring . No spinal canal stenosis. No right, but severe left neural foraminal stenosis. L2-L3: Disc bulge with moderate facet hypertrophy. Mild spinal canal stenosis. Mild bilateral neural foraminal stenosis. L3-L4: Mild disc bulge and severe facet hypertrophy. Mild spinal canal stenosis. Moderate right and  mild left neural foraminal stenosis. L4-L5: Small disc bulge with severe facet hypertrophy. Mild spinal canal stenosis. No neural foraminal stenosis. L5-S1: Normal disc space and facet joints. No spinal canal stenosis. No neural foraminal stenosis. Visualized sacrum: Abnormal T1/T2-weighted signal within both sacral ala, right greater than left. IMPRESSION: 1. Abnormal signal within both sacral ala, likely insufficiency fractures of indeterminate age. 2. Multilevel moderate-to-severe facet arthrosis with mild spinal canal stenosis at L2-3, L3-4 and L4-5. 3. Severe left T12-L1 and L1-2 neural foraminal stenosis. Electronically Signed   By: Ulyses Jarred M.D.   On: 05/28/2020 19:59    Labs:  CBC: Recent Labs    08/16/19 1419  WBC 5.0  HGB 13.1  HCT 39.0  PLT 208.0    COAGS: No results for input(s): INR, APTT in the last 8760 hours.  BMP: Recent Labs    08/16/19 1419  NA 138  K 4.2  CL 102  CO2 29  GLUCOSE 114*  BUN 26*  CALCIUM 9.3  CREATININE 1.21*    LIVER FUNCTION TESTS: Recent Labs    08/16/19 1419  BILITOT 0.6  AST 20  ALT 13  ALKPHOS 52  PROT 6.9  ALBUMIN 3.9    TUMOR MARKERS: No results for input(s): AFPTM, CEA, CA199, CHROMGRNA in the last 8760 hours.  Assessment and Plan:  Insufficiency fractures in both sacral ala.  Will proceed with image guided sacroplasty today.  Risks and benefits of sacrplasty were discussed with the patient including, but not limited to education regarding the natural healing process of compression fractures without intervention, bleeding, infection, cement migration which may cause spinal cord damage, paralysis, pulmonary embolism or even death.  This interventional procedure involves the use of X-rays and because of the nature of the planned procedure, it is possible that we will have prolonged use of X-ray fluoroscopy.  Potential radiation risks to you include (but are not limited to) the following: - A slightly elevated risk  for cancer  several years later in life. This risk is typically less than 0.5% percent. This risk is low in comparison to the normal incidence of human cancer, which is 33% for women and 50% for men according to the [place]. - Radiation induced injury can include skin redness, resembling a rash, tissue breakdown / ulcers and hair loss (which can be temporary or permanent).   The likelihood of either of these occurring depends on the difficulty of the procedure and whether you are sensitive to radiation due to previous procedures, disease, or genetic conditions.   IF your procedure requires a prolonged use of radiation, you will be notified and given written instructions for further action.  It is your responsibility to monitor the irradiated area for the 2 weeks following the procedure and to notify your physician if you are concerned that you have suffered a radiation induced injury.    All of the patient's questions were answered, patient is agreeable to proceed.  Consent signed and in chart.  Thank you for this interesting consult.  I greatly enjoyed meeting Leah [name] and look forward to participating in their care.  A copy of this report was sent to the requesting provider on this date.  Electronically Signed: Murrell Redden, PA-C   06/06/2020, 1:43 PM      I spent a total of  30 Minutes  in face to face in clinical consultation, greater than 50% of which was counseling/coordinating care for sacroplasty.

## 2020-06-06 NOTE — Telephone Encounter (Signed)
Pt informed of below.  Also, she states her dog scratched her right leg above her ankle( it is not red or inflamed) and the fluid is soaking her slacks.   She wants to know if the weeping is normal?  She is having a procedure tomorrow at interventional radiology for bilateral back fractures.

## 2020-06-06 NOTE — Telephone Encounter (Signed)
Can be normal yes, she can decrease sodium in food to help as well as keep legs propped up. Can use compression stockings as well to help.

## 2020-06-07 ENCOUNTER — Encounter (HOSPITAL_COMMUNITY): Payer: Self-pay

## 2020-06-07 ENCOUNTER — Ambulatory Visit (HOSPITAL_COMMUNITY)
Admission: RE | Admit: 2020-06-07 | Discharge: 2020-06-07 | Disposition: A | Discharge status: Home / Self Care | Payer: Medicare PPO | Source: Ambulatory Visit | Attending: Interventional Radiology | Admitting: Interventional Radiology

## 2020-06-07 ENCOUNTER — Other Ambulatory Visit: Payer: Self-pay

## 2020-06-07 DIAGNOSIS — Z87891 Personal history of nicotine dependence: Secondary | ICD-10-CM | POA: Diagnosis not present

## 2020-06-07 DIAGNOSIS — M8448XS Pathological fracture, other site, sequela: Secondary | ICD-10-CM | POA: Insufficient documentation

## 2020-06-07 DIAGNOSIS — M4848XA Fatigue fracture of vertebra, sacral and sacrococcygeal region, initial encounter for fracture: Secondary | ICD-10-CM | POA: Diagnosis not present

## 2020-06-07 DIAGNOSIS — M8448XA Pathological fracture, other site, initial encounter for fracture: Secondary | ICD-10-CM | POA: Diagnosis not present

## 2020-06-07 DIAGNOSIS — Z791 Long term (current) use of non-steroidal anti-inflammatories (NSAID): Secondary | ICD-10-CM | POA: Insufficient documentation

## 2020-06-07 DIAGNOSIS — Z79899 Other long term (current) drug therapy: Secondary | ICD-10-CM | POA: Diagnosis not present

## 2020-06-07 DIAGNOSIS — W19XXXS Unspecified fall, sequela: Secondary | ICD-10-CM | POA: Diagnosis not present

## 2020-06-07 DIAGNOSIS — Z882 Allergy status to sulfonamides status: Secondary | ICD-10-CM | POA: Diagnosis not present

## 2020-06-07 HISTORY — PX: IR SACROPLASTY BILATERAL: IMG5561

## 2020-06-07 LAB — CBC
HCT: 37.6 % (ref 36.0–46.0)
Hemoglobin: 12 g/dL (ref 12.0–15.0)
MCH: 29.7 pg (ref 26.0–34.0)
MCHC: 31.9 g/dL (ref 30.0–36.0)
MCV: 93.1 fL (ref 80.0–100.0)
Platelets: 207 10*3/uL (ref 150–400)
RBC: 4.04 MIL/uL (ref 3.87–5.11)
RDW: 15.1 % (ref 11.5–15.5)
WBC: 5.4 10*3/uL (ref 4.0–10.5)
nRBC: 0 % (ref 0.0–0.2)

## 2020-06-07 LAB — BASIC METABOLIC PANEL
Anion gap: 13 (ref 5–15)
BUN: 52 mg/dL — ABNORMAL HIGH (ref 8–23)
CO2: 24 mmol/L (ref 22–32)
Calcium: 10.4 mg/dL — ABNORMAL HIGH (ref 8.9–10.3)
Chloride: 103 mmol/L (ref 98–111)
Creatinine, Ser: 1.63 mg/dL — ABNORMAL HIGH (ref 0.44–1.00)
GFR, Estimated: 30 mL/min — ABNORMAL LOW (ref >60)
Glucose, Bld: 99 mg/dL (ref 70–99)
Potassium: 3.9 mmol/L (ref 3.5–5.1)
Sodium: 140 mmol/L (ref 135–145)

## 2020-06-07 LAB — PROTIME-INR
INR: 1 (ref 0.8–1.2)
Prothrombin Time: 12.8 seconds (ref 11.4–15.2)

## 2020-06-07 MED ORDER — BUPIVACAINE HCL (PF) 0.5 % IJ SOLN
INTRAMUSCULAR | Status: AC
  Filled 2020-06-07: qty 30

## 2020-06-07 MED ORDER — TOBRAMYCIN SULFATE 1.2 G IJ SOLR
INTRAMUSCULAR | Status: AC
  Filled 2020-06-07: qty 1.2

## 2020-06-07 MED ORDER — SODIUM CHLORIDE 0.9 % IV SOLN: INTRAVENOUS | Status: AC

## 2020-06-07 MED ORDER — IOHEXOL 300 MG/ML  SOLN
50.0000 mL | Freq: Once | INTRAMUSCULAR | Status: AC | PRN
  Administered 2020-06-07: 5 mL

## 2020-06-07 MED ORDER — MIDAZOLAM HCL 2 MG/2ML IJ SOLN
INTRAMUSCULAR | Status: AC | PRN
  Administered 2020-06-07: 1 mg via INTRAVENOUS

## 2020-06-07 MED ORDER — SODIUM CHLORIDE 0.9 % IV SOLN: INTRAVENOUS | Status: DC

## 2020-06-07 MED ORDER — FENTANYL CITRATE (PF) 100 MCG/2ML IJ SOLN
INTRAMUSCULAR | Status: AC | PRN
Start: 2020-06-07 — End: 2020-06-07
  Administered 2020-06-07: 25 ug via INTRAVENOUS

## 2020-06-07 MED ORDER — FENTANYL CITRATE (PF) 100 MCG/2ML IJ SOLN
INTRAMUSCULAR | Status: AC
  Filled 2020-06-07: qty 2

## 2020-06-07 MED ORDER — BUPIVACAINE HCL (PF) 0.5 % IJ SOLN
INTRAMUSCULAR | Status: AC | PRN
  Administered 2020-06-07: 30 mL

## 2020-06-07 MED ORDER — CEFAZOLIN SODIUM-DEXTROSE 2-4 GM/100ML-% IV SOLN: 2.0000 g | INTRAVENOUS | Status: AC

## 2020-06-07 MED ORDER — MIDAZOLAM HCL 2 MG/2ML IJ SOLN
INTRAMUSCULAR | Status: AC
  Filled 2020-06-07: qty 2

## 2020-06-07 MED ORDER — CEFAZOLIN SODIUM-DEXTROSE 2-4 GM/100ML-% IV SOLN
INTRAVENOUS | Status: AC
  Administered 2020-06-07: 2 g via INTRAVENOUS
  Filled 2020-06-07: qty 100

## 2020-06-07 NOTE — Progress Notes (Signed)
Patient was given discharge instructions. She verbalized understanding. 

## 2020-06-07 NOTE — Discharge Instructions (Addendum)
1. No stooping,or bending or lifting more than 10 lbs for 2 weeks. 2.Use walker to ambulate for 2 weeks. 3.No driving for 2 weeks.Marland Kitchen 4.RTC PRN 2 weeks. 5. Call referring MDs office for biopsy results.

## 2020-06-07 NOTE — Procedures (Signed)
S/P S1 sacroplasty  Bilateral approach. Biopsies obtained. S.Kila Godina MD

## 2020-06-11 ENCOUNTER — Telehealth: Payer: Self-pay | Admitting: Gastroenterology

## 2020-06-11 LAB — SURGICAL PATHOLOGY

## 2020-06-11 NOTE — Telephone Encounter (Signed)
It's safe to have her EGD at the scheduled date/time on 2/3 if she can safely lay on her back and in the left lateral decubitus positions, however, if she would like to delay the EGD a few weeks that is fine. She should check with Dr. Nelva Bush for his advice on timing of the EGD.

## 2020-06-11 NOTE — Telephone Encounter (Signed)
Called patient back and let her know Dr. Fuller Plan would like her to check with Dr. Nelva Bush for his advice on the timing of her EGD. She agreed to check

## 2020-06-11 NOTE — Telephone Encounter (Signed)
Pt called to inform that she had vertebroplasty procedure last Thursday 06/07/20 so prefers to postpone her procedure with Dr. Fuller Plan that was scheduled for 07/05/20. Pt wants to know when it is safe for her to have procedure. Thank you.

## 2020-06-21 ENCOUNTER — Other Ambulatory Visit: Payer: Self-pay | Admitting: Orthopedic Surgery

## 2020-06-21 ENCOUNTER — Other Ambulatory Visit (HOSPITAL_COMMUNITY): Payer: Self-pay | Admitting: Orthopedic Surgery

## 2020-06-21 DIAGNOSIS — M25552 Pain in left hip: Secondary | ICD-10-CM | POA: Diagnosis not present

## 2020-06-21 DIAGNOSIS — Z96642 Presence of left artificial hip joint: Secondary | ICD-10-CM | POA: Diagnosis not present

## 2020-06-26 ENCOUNTER — Encounter (HOSPITAL_COMMUNITY)
Admission: RE | Admit: 2020-06-26 | Discharge: 2020-06-26 | Disposition: A | Discharge status: Home / Self Care | Payer: Medicare PPO | Source: Ambulatory Visit | Attending: Orthopedic Surgery | Admitting: Orthopedic Surgery

## 2020-06-26 ENCOUNTER — Ambulatory Visit (HOSPITAL_COMMUNITY)
Admission: RE | Admit: 2020-06-26 | Discharge: 2020-06-26 | Disposition: A | Discharge status: Home / Self Care | Payer: Medicare PPO | Source: Ambulatory Visit | Attending: Orthopedic Surgery | Admitting: Orthopedic Surgery

## 2020-06-26 ENCOUNTER — Other Ambulatory Visit: Payer: Self-pay

## 2020-06-26 DIAGNOSIS — Z96643 Presence of artificial hip joint, bilateral: Secondary | ICD-10-CM | POA: Diagnosis not present

## 2020-06-26 DIAGNOSIS — M25552 Pain in left hip: Secondary | ICD-10-CM | POA: Diagnosis not present

## 2020-06-26 MED ORDER — TECHNETIUM TC 99M MEDRONATE IV KIT
20.0000 | PACK | Freq: Once | INTRAVENOUS | Status: AC | PRN
  Administered 2020-06-26: 20 via INTRAVENOUS

## 2020-06-27 NOTE — Telephone Encounter (Signed)
Inbound call from patient wanting to schedule procedure ut stated has not contacted Dr. Nelva Bush.  Informed her per Dr. Lynne Leader recommendation, should contact them before scheduling.  Will call Dr. Nelva Bush and then call us back to schedule procedure.

## 2020-07-02 ENCOUNTER — Ambulatory Visit: Payer: Medicare PPO | Admitting: Internal Medicine

## 2020-07-02 ENCOUNTER — Encounter: Payer: Self-pay | Admitting: Internal Medicine

## 2020-07-02 ENCOUNTER — Other Ambulatory Visit: Payer: Self-pay | Admitting: Internal Medicine

## 2020-07-02 ENCOUNTER — Other Ambulatory Visit: Payer: Self-pay

## 2020-07-02 DIAGNOSIS — I872 Venous insufficiency (chronic) (peripheral): Secondary | ICD-10-CM | POA: Diagnosis not present

## 2020-07-02 DIAGNOSIS — M25552 Pain in left hip: Secondary | ICD-10-CM | POA: Diagnosis not present

## 2020-07-02 MED ORDER — AZELASTINE-FLUTICASONE 137-50 MCG/ACT NA SUSP: 2.0000 | Freq: Every day | NASAL | 6 refills | Status: DC

## 2020-07-02 NOTE — Progress Notes (Incomplete)
   Subjective:   Patient ID: Leah [name], female    DOB: Jan 27, 1930, 87 y.o.   MRN: [id number]  HPI   Review of Systems  Objective:  Physical Exam  Vitals:   07/02/20 1110  BP: 128/86  Pulse: 86  Resp: 18  Temp: 98.9 F (37.2 C)  TempSrc: Oral  SpO2: 97%  Weight: 131 lb 3.2 oz (59.5 kg)  Height: 5' (1.524 m)    This visit occurred during the SARS-CoV-2 public health emergency.  Safety protocols were in place, including screening questions prior to the visit, additional usage of staff PPE, and extensive cleaning of exam room while observing appropriate contact time as indicated for disinfecting solutions.   Assessment & Plan:

## 2020-07-02 NOTE — Patient Instructions (Signed)
We have sent in the refill of the dymista.   Talk to Dr. Maureen Ralphs and see if physical therapy would help.

## 2020-07-03 DIAGNOSIS — I872 Venous insufficiency (chronic) (peripheral): Secondary | ICD-10-CM | POA: Insufficient documentation

## 2020-07-03 MED ORDER — FUROSEMIDE 20 MG PO TABS: 20.0000 mg | ORAL_TABLET | Freq: Every day | ORAL | 1 refills | Status: DC

## 2020-07-03 NOTE — Assessment & Plan Note (Signed)
Will discuss results with Dr. Maureen Ralphs later this week. We did discuss that the scan does show some fracture superior left hip which is where Dr. Maureen Ralphs was concerned about based on x-ray. She is not having pain currently and will wait for that visit.

## 2020-07-03 NOTE — Assessment & Plan Note (Signed)
Refill lasix 20 mg daily but since this is not changing the swelling significantly advised to use only a couple times per week or even prn for worsening swelling.

## 2020-07-03 NOTE — Progress Notes (Signed)
   Subjective:   Patient ID: Leah [name], female    DOB: May 01, 1930, 87 y.o.   MRN: [id number]  HPI The patient is an 87 YO female coming in for ongoing concerns about left hip pain. Had recent sacroplasty which she feels did not help the pain. She is not having significant left hip pain today. Did see Dr. Maureen Ralphs who is her normal orthopedic recently and he has ordered bone scan. She had this done and will be talking with him about results later this week. All started with fall in October 2021 caused by her dog. She denies recent or recurrent falls. She is now using cane and walker which is an improvement. Denies numbness or weakness in her legs. Concerns about swelling in the right leg which has been present since August 2021 or so and prior US which did rule out DVT. Taking lasix since our last visit and this does seem to cause her to urinate more. Does not seem to significantly affect the swelling in the leg. Swelling is best in the morning and builds up throughout the day.   Review of Systems  Constitutional: Positive for activity change.  HENT: Negative.   Eyes: Negative.   Respiratory: Negative for cough, chest tightness and shortness of breath.   Cardiovascular: Positive for leg swelling. Negative for chest pain and palpitations.  Gastrointestinal: Negative for abdominal distention, abdominal pain, constipation, diarrhea, nausea and vomiting.  Musculoskeletal: Positive for arthralgias, back pain and gait problem.  Skin: Negative.   Psychiatric/Behavioral: Negative.     Objective:  Physical Exam Constitutional:      Appearance: She is well-developed and well-nourished.  HENT:     Head: Normocephalic and atraumatic.  Eyes:     Extraocular Movements: EOM normal.  Cardiovascular:     Rate and Rhythm: Normal rate and regular rhythm.  Pulmonary:     Effort: Pulmonary effort is normal. No respiratory distress.     Breath sounds: Normal breath sounds. No wheezing or rales.   Abdominal:     General: Bowel sounds are normal. There is no distension.     Palpations: Abdomen is soft.     Tenderness: There is no abdominal tenderness. There is no rebound.  Musculoskeletal:        General: No edema.     Cervical back: Normal range of motion.     Comments: Swelling both legs slightly worse in the right, 1-2+ bilaterally pitting  Skin:    General: Skin is warm and dry.  Neurological:     Mental Status: She is alert and oriented to person, place, and time.     Coordination: Coordination abnormal.     Comments: Cane for ambulation  Psychiatric:        Mood and Affect: Mood and affect normal.     Vitals:   07/02/20 1110  BP: 128/86  Pulse: 86  Resp: 18  Temp: 98.9 F (37.2 C)  TempSrc: Oral  SpO2: 97%  Weight: 131 lb 3.2 oz (59.5 kg)  Height: 5' (1.524 m)    This visit occurred during the SARS-CoV-2 public health emergency.  Safety protocols were in place, including screening questions prior to the visit, additional usage of staff PPE, and extensive cleaning of exam room while observing appropriate contact time as indicated for disinfecting solutions.   Assessment & Plan:

## 2020-07-04 DIAGNOSIS — M25552 Pain in left hip: Secondary | ICD-10-CM | POA: Diagnosis not present

## 2020-07-05 ENCOUNTER — Encounter: Payer: Medicare PPO | Admitting: Gastroenterology

## 2020-07-05 ENCOUNTER — Other Ambulatory Visit: Payer: Self-pay | Admitting: Internal Medicine

## 2020-07-06 ENCOUNTER — Other Ambulatory Visit: Payer: Self-pay | Admitting: Internal Medicine

## 2020-07-06 NOTE — Telephone Encounter (Signed)
Ruthe Mannan is a lung medication used for asthma and COPD. This is a nose spray used for allergies so they are not the same kind of medicine at all. I would not recommend dulera for her.

## 2020-07-06 NOTE — Telephone Encounter (Signed)
Patient would like a script for [facility] instead. Please advise

## 2020-07-13 ENCOUNTER — Telehealth: Payer: Self-pay | Admitting: Internal Medicine

## 2020-07-13 DIAGNOSIS — M25552 Pain in left hip: Secondary | ICD-10-CM

## 2020-07-13 NOTE — Telephone Encounter (Signed)
° °  Patient requesting referral to pain management. Patient states she still has pain all over from fall last year Dr [employer] at [facility], phone [phone]

## 2020-07-13 NOTE — Telephone Encounter (Signed)
See below

## 2020-07-13 NOTE — Telephone Encounter (Signed)
Referral placed.

## 2020-07-19 ENCOUNTER — Other Ambulatory Visit: Payer: Self-pay

## 2020-07-19 ENCOUNTER — Telehealth: Payer: Self-pay

## 2020-07-19 NOTE — Telephone Encounter (Signed)
PA has been initiated on covermymeds for Azelastine-Fluticasone nasal spray. Key: [id number]  I will update once a decision has been made.

## 2020-07-19 NOTE — Telephone Encounter (Signed)
Error

## 2020-07-24 ENCOUNTER — Other Ambulatory Visit: Payer: Self-pay

## 2020-07-24 ENCOUNTER — Telehealth: Payer: Self-pay

## 2020-07-24 ENCOUNTER — Encounter: Payer: Self-pay | Admitting: Internal Medicine

## 2020-07-24 ENCOUNTER — Ambulatory Visit: Payer: Medicare PPO | Admitting: Internal Medicine

## 2020-07-24 VITALS — BP 132/78 | HR 88 | Temp 98.0°F | Resp 18 | Ht 60.0 in | Wt 135.0 lb

## 2020-07-24 DIAGNOSIS — I872 Venous insufficiency (chronic) (peripheral): Secondary | ICD-10-CM

## 2020-07-24 DIAGNOSIS — M79605 Pain in left leg: Secondary | ICD-10-CM | POA: Diagnosis not present

## 2020-07-24 DIAGNOSIS — G8929 Other chronic pain: Secondary | ICD-10-CM | POA: Diagnosis not present

## 2020-07-24 DIAGNOSIS — N179 Acute kidney failure, unspecified: Secondary | ICD-10-CM | POA: Insufficient documentation

## 2020-07-24 DIAGNOSIS — M79604 Pain in right leg: Secondary | ICD-10-CM | POA: Diagnosis not present

## 2020-07-24 DIAGNOSIS — R609 Edema, unspecified: Secondary | ICD-10-CM | POA: Insufficient documentation

## 2020-07-24 DIAGNOSIS — M545 Low back pain, unspecified: Secondary | ICD-10-CM

## 2020-07-24 LAB — HEPATIC FUNCTION PANEL
ALT: 10 U/L (ref 0–35)
AST: 18 U/L (ref 0–37)
Albumin: 4.1 g/dL (ref 3.5–5.2)
Alkaline Phosphatase: 137 U/L — ABNORMAL HIGH (ref 39–117)
Bilirubin, Direct: 0.1 mg/dL (ref 0.0–0.3)
Total Bilirubin: 0.4 mg/dL (ref 0.2–1.2)
Total Protein: 6.9 g/dL (ref 6.0–8.3)

## 2020-07-24 LAB — CBC WITH DIFFERENTIAL/PLATELET
Basophils Absolute: 0 10*3/uL (ref 0.0–0.1)
Basophils Relative: 0.7 % (ref 0.0–3.0)
Eosinophils Absolute: 0.2 10*3/uL (ref 0.0–0.7)
Eosinophils Relative: 2.9 % (ref 0.0–5.0)
HCT: 34 % — ABNORMAL LOW (ref 36.0–46.0)
Hemoglobin: 11.5 g/dL — ABNORMAL LOW (ref 12.0–15.0)
Lymphocytes Relative: 24.4 % (ref 12.0–46.0)
Lymphs Abs: 1.4 10*3/uL (ref 0.7–4.0)
MCHC: 33.8 g/dL (ref 30.0–36.0)
MCV: 91.9 fl (ref 78.0–100.0)
Monocytes Absolute: 0.5 10*3/uL (ref 0.1–1.0)
Monocytes Relative: 7.8 % (ref 3.0–12.0)
Neutro Abs: 3.8 10*3/uL (ref 1.4–7.7)
Neutrophils Relative %: 64.2 % (ref 43.0–77.0)
Platelets: 240 10*3/uL (ref 150.0–400.0)
RBC: 3.7 Mil/uL — ABNORMAL LOW (ref 3.87–5.11)
RDW: 14.7 % (ref 11.5–15.5)
WBC: 5.9 10*3/uL (ref 4.0–10.5)

## 2020-07-24 LAB — BASIC METABOLIC PANEL
BUN: 33 mg/dL — ABNORMAL HIGH (ref 6–23)
CO2: 29 mEq/L (ref 19–32)
Calcium: 9.8 mg/dL (ref 8.4–10.5)
Chloride: 103 mEq/L (ref 96–112)
Creatinine, Ser: 1.26 mg/dL — ABNORMAL HIGH (ref 0.40–1.20)
GFR: 38.21 mL/min — ABNORMAL LOW (ref >60.00)
Glucose, Bld: 107 mg/dL — ABNORMAL HIGH (ref 70–99)
Potassium: 4.3 mEq/L (ref 3.5–5.1)
Sodium: 142 mEq/L (ref 135–145)

## 2020-07-24 LAB — BRAIN NATRIURETIC PEPTIDE: Pro B Natriuretic peptide (BNP): 54 pg/mL (ref 0.0–100.0)

## 2020-07-24 MED ORDER — FUROSEMIDE 20 MG PO TABS: 20.0000 mg | ORAL_TABLET | Freq: Two times a day (BID) | ORAL | 1 refills | Status: DC

## 2020-07-24 NOTE — Patient Instructions (Signed)
Ok to increase the lasix to 20 mg twice per day  Please continue all other medications as before, and refills have been done if requested.  Please have the pharmacy call with any other refills you may need.  Please keep your appointments with your specialists as you may have planned  You will be contacted regarding the referral for: Dr Hardin Negus as you mentioned for the pain  You will be contacted regarding the referral for: Kidney ultrasound  Please go to the LAB at the blood drawing area for the tests to be done - the kidney function tests again  Please see Dr Sharlet Salina in 1 week for further kidney consideration  .

## 2020-07-24 NOTE — Telephone Encounter (Signed)
I think they mean do I want bilat LE venous dopplers - not a bad idea , so ok I will order, thiough clinical suspicion is not overly high

## 2020-07-24 NOTE — Telephone Encounter (Signed)
[facility] wanted to clarify that only an ABDM u/s was needed.  Due to the other diagnosis of venous insuffiencey & peripheral edema, wanted to clarify if another type of u/s was needed in addition to.

## 2020-07-24 NOTE — Progress Notes (Signed)
Patient ID: Leah [name], female   DOB: 08-27-1929, 87 y.o.   MRN: [id number]        Chief Complaint: follow up AKI and leg swelling       HPI:  Leah [name] is a 87 y.o. female here with c/o worsening bialteral leg swelling after lasix stopped per renal; Pt denies chest pain, increased sob or doe, wheezing, orthopnea, PND, palpitations, dizziness or syncope.  Also has mild weepiness to the right mid leg with the beginnings of a sore, wondering if now infected, but no worsenig pain, redness.  Also has ongoing chronic pain, asks for referral to Dr Hardin Negus pain management.  Denies new focal neuro s/s.  Also  Pt denies polydipsia, polyuria, Wt Readings from Last 3 Encounters:  07/31/20 133 lb 12.8 oz (60.7 kg)  07/24/20 135 lb (61.2 kg)  07/02/20 131 lb 3.2 oz (59.5 kg)   BP Readings from Last 3 Encounters:  07/31/20 122/64  07/24/20 132/78  07/02/20 128/86         Past Medical History:  Diagnosis Date  . Acute maxillary sinusitis   . Allergy   . Bundle branch block, unspecified    EKG 4/13 with clearance and note that isnt new block Dr Arnoldo Morale on chart  . Carpal tunnel syndrome   . Cystocele   . Detrusor instability   . GERD (gastroesophageal reflux disease)   . Glaucoma   . Hyperlipidemia   . Localized osteoarthrosis not specified whether primary or secondary, lower leg   . Osteoporosis 01/2014   T score -2.5 UD forarm, stable at the spine recommend 2 year follow up DEXA  . PONV (postoperative nausea and vomiting)    19 yrs ago- states has had general anesthesia since and did well  . Unspecified adverse effect of unspecified drug, medicinal and biological substance   . Uterine prolapse    Past Surgical History:  Procedure Laterality Date  . arthroscopic r knee    . CARPAL TUNNEL RELEASE  2011  . DILATION AND CURETTAGE OF UTERUS  1982  . EYE SURGERY     bilateral cataract extraction  with IOL  . FLEXIBLE SIGMOIDOSCOPY    . HYSTEROSCOPY    . HYSTEROSCOPY WITH D &  C  2005 and 2008  . IR SACROPLASTY BILATERAL  06/07/2020  . JOINT REPLACEMENT     bilateral total hip arthroplasty  . total hip rerplacement bilateral    . TOTAL KNEE ARTHROPLASTY  10/13/2011   Procedure: TOTAL KNEE ARTHROPLASTY;  Surgeon: Gearlean Alf, MD;  Location: [facility];  Service: Orthopedics;  Laterality: Right;  . UPPER GI ENDOSCOPY  07/2018 and 3/20    reports that she quit smoking about 61 years ago. She has never used smokeless tobacco. She reports current alcohol use. She reports that she does not use drugs. family history includes Breast cancer (age of onset: 53) in her mother; Dementia in her mother and sister; Heart disease in her father; Stroke in her father. Allergies  Allergen Reactions  . Sulfa Antibiotics Rash  . Sulfasalazine Hives and Rash  . Nabumetone Other (See Comments)    Feel weird    . Naproxen Sodium Hives  . Tramadol Hcl Nausea And Vomiting   Current Outpatient Medications on File Prior to Visit  Medication Sig Dispense Refill  . acetaminophen (TYLENOL) 650 MG CR tablet Take 1,300 mg by mouth every 8 (eight) hours as needed for pain.    Marland Kitchen Apoaequorin (PREVAGEN) 10 MG CAPS Take  10 mg by mouth daily.    . Ascorbic Acid (VITAMIN C) 1000 MG tablet Take 1,000 mg by mouth daily.    Marland Kitchen aspirin EC 81 MG tablet Take 81 mg by mouth daily. Swallow whole.    . Azelastine-Fluticasone 137-50 MCG/ACT SUSP USE TWO SPRAYS IN EACH NOTSTRIL DAILY 23 g 6  . CALCIUM-MAGNESIUM-ZINC PO Take 3 tablets by mouth daily.    . Cholecalciferol (DIALYVITE VITAMIN D 5000) 125 MCG (5000 UT) capsule Take 5,000 Units by mouth daily.    Marland Kitchen denosumab (PROLIA) 60 MG/ML SOLN injection Inject 60 mg into the skin every 6 (six) months. Administer in upper arm, thigh, or abdomen    . latanoprost (XALATAN) 0.005 % ophthalmic solution Place 1 drop into both eyes at bedtime.    . meloxicam (MOBIC) 15 MG tablet Take 1 tablet (15 mg total) by mouth daily. 90 tablet 0  . Multiple Vitamins-Minerals  (OCUVITE ADULT 50+ PO) Take 1 tablet by mouth daily.    . pantoprazole (PROTONIX) 40 MG tablet Take 1 tablet (40 mg total) by mouth daily. 90 tablet 3  . timolol (TIMOPTIC) 0.5 % ophthalmic solution Place 1 drop into both eyes daily.    . vitamin E 400 UNIT capsule Take 400 Units by mouth daily.     No current facility-administered medications on file prior to visit.        ROS:  All others reviewed and negative.  Objective        PE:  BP 132/78   Pulse 88   Temp 98 F (36.7 C) (Oral)   Resp 18   Ht 5' (1.524 m)   Wt 135 lb (61.2 kg)   SpO2 96%   BMI 26.37 kg/m                 Constitutional: Pt appears in NAD               HENT: Head: NCAT.                Right Ear: External ear normal.                 Left Ear: External ear normal.                Eyes: . Pupils are equal, round, and reactive to light. Conjunctivae and EOM are normal               Nose: without d/c or deformity               Neck: Neck supple. Gross normal ROM               Cardiovascular: Normal rate and regular rhythm.                 Pulmonary/Chest: Effort normal and breath sounds without rales or wheezing.                Abd:  Soft, NT, ND, + BS, no organomegaly               Neurological: Pt is alert. At baseline orientation, motor grossly intact               Skin: Skin is warm. No rashes, no other new lesions, LE edema - 2+ right > left edemea to knees with very early extremely shallow suggestion of right mid leg anterior ulceration, but no s/s cellulitis or weepiness  Psychiatric: Pt behavior is normal without agitation   Micro: none  Cardiac tracings I have personally interpreted today:  none  Pertinent Radiological findings (summarize):  11/23/2014 Echo Study Conclusions   - Left ventricle: The cavity size was normal. Wall thickness was  normal. Systolic function was normal. The estimated ejection  fraction was in the range of 55% to 60%. Wall motion was normal;  there were  no regional wall motion abnormalities. Doppler  parameters are consistent with abnormal left ventricular  relaxation (grade 1 diastolic dysfunction).    Lab Results  Component Value Date   WBC 5.9 07/24/2020   HGB 11.5 (L) 07/24/2020   HCT 34.0 (L) 07/24/2020   PLT 240.0 07/24/2020   GLUCOSE 107 (H) 07/24/2020   CHOL 205 (H) 08/16/2019   TRIG 213.0 (H) 08/16/2019   HDL 71.10 08/16/2019   LDLDIRECT 106.0 08/16/2019   LDLCALC 100 (H) 02/03/2018   ALT 10 07/24/2020   AST 18 07/24/2020   NA 142 07/24/2020   K 4.3 07/24/2020   CL 103 07/24/2020   CREATININE 1.26 (H) 07/24/2020   BUN 33 (H) 07/24/2020   CO2 29 07/24/2020   TSH 2.43 08/16/2019   INR 1.0 06/07/2020   Assessment/Plan:  CATHERN TAHIR is a 87 y.o. White or Caucasian [1] female with  has a past medical history of Acute maxillary sinusitis, Allergy, Bundle branch block, unspecified, Carpal tunnel syndrome, Cystocele, Detrusor instability, GERD (gastroesophageal reflux disease), Glaucoma, Hyperlipidemia, Localized osteoarthrosis not specified whether primary or secondary, lower leg, Osteoporosis (01/2014), PONV (postoperative nausea and vomiting), Unspecified adverse effect of unspecified drug, medicinal and biological substance, and Uterine prolapse.  Chronic low back pain Overall chronic persistent, not well controlled per daughter, asks for pain management referral  Peripheral edema Marked worsening, ok for increased lasix 20 bid, also bnp with labs; no evidence for cellulitis on exam today and seems dissapointed in this  AKI (acute kidney injury) ([place]) For bmp and renal u/s,  to f/u any worsening symptoms or concerns  Followup: Return in about 1 week (around 07/31/2020).  Cathlean Cower, MD 07/31/2020 10:06 PM [facility]

## 2020-07-24 NOTE — Telephone Encounter (Signed)
LVM re: Dr Jenny Reichmann response; advised to call office if more questions.

## 2020-07-26 ENCOUNTER — Other Ambulatory Visit: Payer: Self-pay | Admitting: Internal Medicine

## 2020-07-26 ENCOUNTER — Encounter: Payer: Self-pay | Admitting: Internal Medicine

## 2020-07-26 ENCOUNTER — Ambulatory Visit
Admission: RE | Admit: 2020-07-26 | Discharge: 2020-07-26 | Disposition: A | Discharge status: Home / Self Care | Payer: Medicare PPO | Source: Ambulatory Visit | Attending: Internal Medicine | Admitting: Internal Medicine

## 2020-07-26 DIAGNOSIS — R609 Edema, unspecified: Secondary | ICD-10-CM

## 2020-07-26 DIAGNOSIS — G8929 Other chronic pain: Secondary | ICD-10-CM

## 2020-07-26 DIAGNOSIS — I872 Venous insufficiency (chronic) (peripheral): Secondary | ICD-10-CM

## 2020-07-26 DIAGNOSIS — M545 Low back pain, unspecified: Secondary | ICD-10-CM

## 2020-07-26 DIAGNOSIS — N179 Acute kidney failure, unspecified: Secondary | ICD-10-CM | POA: Diagnosis not present

## 2020-07-26 DIAGNOSIS — K8020 Calculus of gallbladder without cholecystitis without obstruction: Secondary | ICD-10-CM | POA: Diagnosis not present

## 2020-07-26 DIAGNOSIS — M79604 Pain in right leg: Secondary | ICD-10-CM

## 2020-07-30 ENCOUNTER — Telehealth: Payer: Self-pay | Admitting: Internal Medicine

## 2020-07-30 NOTE — Telephone Encounter (Signed)
Prolia [id number] Authorized  Dates: 8.5.21 thru 12.31.22 PA# [id number] No out of pocket costs  Called to schedule, unable to lvm...will call again

## 2020-07-30 NOTE — Telephone Encounter (Signed)
Noted  

## 2020-07-31 ENCOUNTER — Ambulatory Visit: Payer: Medicare PPO | Admitting: Internal Medicine

## 2020-07-31 ENCOUNTER — Other Ambulatory Visit: Payer: Self-pay

## 2020-07-31 ENCOUNTER — Encounter: Payer: Self-pay | Admitting: Internal Medicine

## 2020-07-31 VITALS — BP 122/64 | HR 54 | Temp 97.9°F | Resp 18 | Ht 60.0 in | Wt 133.8 lb

## 2020-07-31 DIAGNOSIS — N1832 Chronic kidney disease, stage 3b: Secondary | ICD-10-CM | POA: Insufficient documentation

## 2020-07-31 DIAGNOSIS — M810 Age-related osteoporosis without current pathological fracture: Secondary | ICD-10-CM | POA: Diagnosis not present

## 2020-07-31 DIAGNOSIS — I872 Venous insufficiency (chronic) (peripheral): Secondary | ICD-10-CM | POA: Diagnosis not present

## 2020-07-31 DIAGNOSIS — N179 Acute kidney failure, unspecified: Secondary | ICD-10-CM | POA: Insufficient documentation

## 2020-07-31 DIAGNOSIS — N183 Chronic kidney disease, stage 3 unspecified: Secondary | ICD-10-CM | POA: Insufficient documentation

## 2020-07-31 MED ORDER — DENOSUMAB 60 MG/ML ~~LOC~~ SOSY
60.0000 mg | PREFILLED_SYRINGE | Freq: Once | SUBCUTANEOUS | Status: AC
  Administered 2020-07-31: 60 mg via SUBCUTANEOUS

## 2020-07-31 NOTE — Assessment & Plan Note (Signed)
Given prolia, most recent calcium normal. Recommended to continue and discussed with patient risk of bone loss especially in the spine with missing doses.

## 2020-07-31 NOTE — Assessment & Plan Note (Signed)
For bmp and renal u/s,  to f/u any worsening symptoms or concerns

## 2020-07-31 NOTE — Patient Instructions (Addendum)
We have given you the prolia today.  It is okay to keep taking the 2 pills of the lasix for the swelling.  I don't think you need the ultrasound of the legs.

## 2020-07-31 NOTE — Assessment & Plan Note (Addendum)
Marked worsening, ok for increased lasix 20 bid, also bnp with labs; no evidence for cellulitis on exam today and seems dissapointed in this

## 2020-07-31 NOTE — Progress Notes (Signed)
Subjective:   Patient ID: Leah [name], female    DOB: 02/08/1930, 87 y.o.   MRN: [id number]  HPI The patient is an 87 YO female coming in for follow up kidney change (she was scared quite a lot at recent visit by provider talking about dialysis, she had repeat kidney function and they were stable to her baseline from before, she is taking the lasix for swelling, ) and leg swelling (was ordered US DVT and not sure if she needs this, the swelling is chronic and overall was worse than usual about a week or so ago, was advised to double lasix which did help, now back to normal more so, better in the morning and worse at night) and osteoporosis (due for prolia, she has not felt any difference, taking about 2 years, thinks she had recent dexa at ob/gyn not sure what this showed).   Review of Systems  Constitutional: Negative.   HENT: Negative.   Eyes: Negative.   Respiratory: Negative for cough, chest tightness and shortness of breath.   Cardiovascular: Positive for leg swelling. Negative for chest pain and palpitations.  Gastrointestinal: Negative for abdominal distention, abdominal pain, constipation, diarrhea, nausea and vomiting.  Musculoskeletal: Positive for arthralgias and gait problem.  Skin: Negative.   Neurological: Negative for dizziness, weakness and numbness.  Psychiatric/Behavioral: Negative.     Objective:  Physical Exam Constitutional:      Appearance: She is well-developed and well-nourished.  HENT:     Head: Normocephalic and atraumatic.  Eyes:     Extraocular Movements: EOM normal.  Cardiovascular:     Rate and Rhythm: Normal rate and regular rhythm.  Pulmonary:     Effort: Pulmonary effort is normal. No respiratory distress.     Breath sounds: Normal breath sounds. No wheezing or rales.  Abdominal:     General: Bowel sounds are normal. There is no distension.     Palpations: Abdomen is soft.     Tenderness: There is no abdominal tenderness. There is no  rebound.  Musculoskeletal:        General: Tenderness present.     Cervical back: Normal range of motion.     Right lower leg: Edema present.     Left lower leg: Edema present.     Comments: 1+ pitting edema bilaterally  Skin:    General: Skin is warm and dry.     Comments: Skin breakdown on the right shin without surrounding erythema or cellulitis  Neurological:     Mental Status: She is alert and oriented to person, place, and time.     Coordination: Coordination abnormal.     Comments: Cane for ambulation  Psychiatric:        Mood and Affect: Mood and affect normal.     Vitals:   07/31/20 1043  BP: 122/64  Pulse: (!) 54  Resp: 18  Temp: 97.9 F (36.6 C)  TempSrc: Oral  SpO2: 97%  Weight: 133 lb 12.8 oz (60.7 kg)  Height: 5' (1.524 m)    This visit occurred during the SARS-CoV-2 public health emergency.  Safety protocols were in place, including screening questions prior to the visit, additional usage of staff PPE, and extensive cleaning of exam room while observing appropriate contact time as indicated for disinfecting solutions.   Prolia given at visit  Assessment & Plan:  Visit time 35 minutes in face to face communication with patient and coordination of care, additional 15 minutes spent in record review, coordination or care, ordering  tests, communicating/referring to other healthcare professionals, documenting in medical records all on the same day of the visit for total time 50 minutes spent on the visit.

## 2020-07-31 NOTE — Assessment & Plan Note (Signed)
Advised that she does not need [place] to rule out DVT. We did discuss that she did not have signs of heart failure causing this as well as normal US of the kidneys. Okay to continue lasix 2 pills daily.

## 2020-07-31 NOTE — Assessment & Plan Note (Signed)
Overall creatinine stable other than with surgical procedure in January. Will monitor every 6 months. Did discuss normal US of the kidneys and normal aging process of kidneys. BP at goal and does not have diabetes.

## 2020-07-31 NOTE — Assessment & Plan Note (Signed)
Overall chronic persistent, not well controlled per daughter, asks for pain management referral

## 2020-08-14 ENCOUNTER — Telehealth: Payer: Self-pay | Admitting: Internal Medicine

## 2020-08-14 NOTE — Telephone Encounter (Signed)
See below

## 2020-08-14 NOTE — Telephone Encounter (Signed)
Patient said during her last visit on 03.01.22 she talked to Dr. Sharlet Salina about getting referred to a pain management doctor but she wasn't sure if she wanted to. She has now decided that she wants to proceed with the referral and she would like to go to Dr. Nicholaus Bloom at [facility] because the pain in her legs has gotten worse.

## 2020-08-15 NOTE — Telephone Encounter (Signed)
Referral was done in Feb 2022 and faxed.

## 2020-08-27 ENCOUNTER — Ambulatory Visit: Payer: Medicare PPO | Admitting: Internal Medicine

## 2020-08-27 ENCOUNTER — Other Ambulatory Visit: Payer: Self-pay

## 2020-08-27 ENCOUNTER — Encounter: Payer: Self-pay | Admitting: Internal Medicine

## 2020-08-27 DIAGNOSIS — M8949 Other hypertrophic osteoarthropathy, multiple sites: Secondary | ICD-10-CM | POA: Diagnosis not present

## 2020-08-27 DIAGNOSIS — M810 Age-related osteoporosis without current pathological fracture: Secondary | ICD-10-CM | POA: Diagnosis not present

## 2020-08-27 DIAGNOSIS — M159 Polyosteoarthritis, unspecified: Secondary | ICD-10-CM

## 2020-08-27 DIAGNOSIS — R609 Edema, unspecified: Secondary | ICD-10-CM | POA: Diagnosis not present

## 2020-08-27 MED ORDER — DICLOFENAC SODIUM 1 % EX GEL: 2.0000 g | Freq: Four times a day (QID) | CUTANEOUS | 3 refills | Status: DC

## 2020-08-27 MED ORDER — DICLOFENAC SODIUM 75 MG PO TBEC: 75.0000 mg | DELAYED_RELEASE_TABLET | Freq: Two times a day (BID) | ORAL | 3 refills | Status: DC

## 2020-08-27 MED ORDER — CALCITONIN (SALMON) 200 UNIT/ACT NA SOLN: 1.0000 | Freq: Every day | NASAL | 1 refills | Status: DC

## 2020-08-27 MED ORDER — SILVER SULFADIAZINE 1 % EX CREA: 1.0000 "application " | TOPICAL_CREAM | Freq: Every day | CUTANEOUS | 0 refills | Status: DC

## 2020-08-27 NOTE — Progress Notes (Signed)
   Subjective:   Patient ID: Leah [name], female    DOB: 1930/03/21, 87 y.o.   MRN: [id number]  HPI The patient is an 87 YO female coming in for pain associated with pelvic fracture. She is frustrated by her lack of improvement. She is waiting on pain management but unclear when they will be able to see her. She is taking meloxicam and tylenol. Recently this was not working well so she added ibuprofen on top of that 2 tablets several times a day. This did not help much extra. She has heard from her friends that they are taking diclofenac and wonder if she could change to this.   Review of Systems  Constitutional: Positive for activity change.  HENT: Negative.   Eyes: Negative.   Respiratory: Negative for cough, chest tightness and shortness of breath.   Cardiovascular: Negative for chest pain, palpitations and leg swelling.  Gastrointestinal: Negative for abdominal distention, abdominal pain, constipation, diarrhea, nausea and vomiting.  Musculoskeletal: Positive for arthralgias, gait problem and myalgias.  Skin: Negative.   Psychiatric/Behavioral: Negative.     Objective:  Physical Exam Constitutional:      Appearance: She is well-developed.  HENT:     Head: Normocephalic and atraumatic.  Cardiovascular:     Rate and Rhythm: Normal rate and regular rhythm.  Pulmonary:     Effort: Pulmonary effort is normal. No respiratory distress.     Breath sounds: Normal breath sounds. No wheezing or rales.  Abdominal:     General: Bowel sounds are normal. There is no distension.     Palpations: Abdomen is soft.     Tenderness: There is no abdominal tenderness. There is no rebound.  Musculoskeletal:     Cervical back: Normal range of motion.  Skin:    General: Skin is warm and dry.  Neurological:     Mental Status: She is alert and oriented to person, place, and time.     Coordination: Coordination abnormal.     Comments: Wheelchair today due to long distance, walks with assistive  device at home     Vitals:   08/27/20 1501  BP: 134/78  Pulse: 75  Resp: 18  Temp: 98.4 F (36.9 C)  TempSrc: Oral  SpO2: 95%  Weight: 132 lb 6.4 oz (60.1 kg)  Height: 5' (1.524 m)    This visit occurred during the SARS-CoV-2 public health emergency.  Safety protocols were in place, including screening questions prior to the visit, additional usage of staff PPE, and extensive cleaning of exam room while observing appropriate contact time as indicated for disinfecting solutions.   Assessment & Plan:

## 2020-08-27 NOTE — Patient Instructions (Addendum)
We have sent in diclofenac to take 1 pill twice a day. Stop taking meloxicam (mobic) and stop taking ibuprofen. You can also use tylenol with this.  We have sent in the voltaren gel to use as you need it for the hands.  We have sent in the nose spray to use 1 spray daily. Alternate nostrils every other day. We will try this for 2 months to help the pelvis fracture heal better.   We have sent in silvadene ointment to use on the leg daily or when changing the bandage. If this does not help let us know and we can get you in with the wound clinic.

## 2020-08-28 ENCOUNTER — Encounter: Payer: Self-pay | Admitting: Internal Medicine

## 2020-08-28 NOTE — Assessment & Plan Note (Signed)
Will add calcitonin nasal spray as she is struggling with the pelvis fractures. She is also on prolia and will continue with that also. We will plan for this for about 1-2 months if effective to reduce pain.

## 2020-08-28 NOTE — Assessment & Plan Note (Signed)
She has not had much benefit from increasing dose lasix and with her pain issues getting to the restroom is an inconvenience. Asked her to use lasix prn only.

## 2020-08-28 NOTE — Assessment & Plan Note (Signed)
Stop meloxicam and ibuprofen. Rx diclofenac gel and oral. Advised her to not use otc nsaids with this medication. Can use tylenol also.

## 2020-09-05 ENCOUNTER — Ambulatory Visit: Payer: Medicare PPO | Admitting: Family

## 2020-09-05 ENCOUNTER — Telehealth: Payer: Self-pay | Admitting: Internal Medicine

## 2020-09-05 DIAGNOSIS — S81809A Unspecified open wound, unspecified lower leg, initial encounter: Secondary | ICD-10-CM

## 2020-09-05 DIAGNOSIS — I872 Venous insufficiency (chronic) (peripheral): Secondary | ICD-10-CM

## 2020-09-05 NOTE — Telephone Encounter (Signed)
  Patient requesting referral to wound clinic; leg wound Patient states issue was discuss during last OV.  Please advise

## 2020-09-05 NOTE — Telephone Encounter (Signed)
See below

## 2020-09-06 NOTE — Telephone Encounter (Signed)
Referral placed.

## 2020-09-24 ENCOUNTER — Ambulatory Visit: Payer: Medicare PPO | Admitting: Internal Medicine

## 2020-09-25 ENCOUNTER — Ambulatory Visit: Payer: Medicare PPO | Admitting: Internal Medicine

## 2020-09-25 ENCOUNTER — Encounter: Payer: Self-pay | Admitting: Internal Medicine

## 2020-09-25 ENCOUNTER — Other Ambulatory Visit: Payer: Self-pay

## 2020-09-25 DIAGNOSIS — M545 Low back pain, unspecified: Secondary | ICD-10-CM | POA: Diagnosis not present

## 2020-09-25 DIAGNOSIS — R609 Edema, unspecified: Secondary | ICD-10-CM

## 2020-09-25 DIAGNOSIS — G8929 Other chronic pain: Secondary | ICD-10-CM

## 2020-09-25 NOTE — Patient Instructions (Signed)
Keep things the same for now and I'm sorry to hear about your husband.

## 2020-09-25 NOTE — Progress Notes (Signed)
   Subjective:   Patient ID: Leah [name], female    DOB: [birth year], 87 y.o.   MRN: [id number]  HPI The patient is an 87 YO female coming in for concern about back pain (overall this is improving, she is taking calcitonin nasal spray with plans to continue for 2 months total, she is having gradual improvement, mobility is improving some, not sure if she still needs pain management and does not want to go there, originally she has heard good things about a specific provider but he could not get her in and she is still on waiting list) and leg edema (with some weeping and sores are still there, seeing wound center next week, denies pain in the legs, is keeping them elevated, taking lasix intermittently but does not think it is helping).   Review of Systems  Constitutional: Negative.  Negative for appetite change, chills, fatigue, fever and unexpected weight change.  HENT: Negative.   Eyes: Negative.   Respiratory: Negative.  Negative for cough, chest tightness and shortness of breath.   Cardiovascular: Positive for leg swelling. Negative for chest pain and palpitations.  Gastrointestinal: Negative.  Negative for abdominal distention, abdominal pain, constipation, diarrhea, nausea and vomiting.  Musculoskeletal: Positive for arthralgias, back pain and myalgias. Negative for gait problem and joint swelling.  Skin: Negative.   Neurological: Negative.   Psychiatric/Behavioral: Negative.     Objective:  Physical Exam Constitutional:      Appearance: She is well-developed.  HENT:     Head: Normocephalic and atraumatic.  Cardiovascular:     Rate and Rhythm: Normal rate and regular rhythm.  Pulmonary:     Effort: Pulmonary effort is normal. No respiratory distress.     Breath sounds: Normal breath sounds. No wheezing or rales.  Abdominal:     General: Bowel sounds are normal. There is no distension.     Palpations: Abdomen is soft.     Tenderness: There is no abdominal tenderness. There  is no rebound.  Musculoskeletal:        General: Tenderness present.     Cervical back: Normal range of motion.     Right lower leg: Edema present.     Left lower leg: Edema present.     Comments: 2+ edema to the knees bilaterally with some skin breakdown limited to skin layer  Skin:    General: Skin is warm and dry.  Neurological:     Mental Status: She is alert and oriented to person, place, and time.     Coordination: Coordination normal.     Vitals:   09/25/20 1358  BP: 126/68  Pulse: 83  Resp: 18  Temp: 99.2 F (37.3 C)  TempSrc: Oral  SpO2: 95%  Weight: 131 lb 1.6 oz (59.5 kg)  Height: 5' (1.524 m)    This visit occurred during the SARS-CoV-2 public health emergency.  Safety protocols were in place, including screening questions prior to the visit, additional usage of staff PPE, and extensive cleaning of exam room while observing appropriate contact time as indicated for disinfecting solutions.   Assessment & Plan:

## 2020-09-28 NOTE — Assessment & Plan Note (Signed)
Seeing wound center next week to help with edema and wounds. We did discuss that the longer she has swelling the less likely she will be to be swelling free and it is likely to come back.

## 2020-09-28 NOTE — Assessment & Plan Note (Signed)
Overall improving with the nasal calcitonin and advised to continue for full recommended 2 months then stop. She is also using voltaren oral which is helping.

## 2020-10-04 ENCOUNTER — Encounter (HOSPITAL_BASED_OUTPATIENT_CLINIC_OR_DEPARTMENT_OTHER): Payer: Medicare PPO | Attending: Internal Medicine | Admitting: Internal Medicine

## 2020-10-04 ENCOUNTER — Other Ambulatory Visit: Payer: Self-pay

## 2020-10-04 DIAGNOSIS — L97812 Non-pressure chronic ulcer of other part of right lower leg with fat layer exposed: Secondary | ICD-10-CM | POA: Insufficient documentation

## 2020-10-04 DIAGNOSIS — I87333 Chronic venous hypertension (idiopathic) with ulcer and inflammation of bilateral lower extremity: Secondary | ICD-10-CM | POA: Insufficient documentation

## 2020-10-04 DIAGNOSIS — L97822 Non-pressure chronic ulcer of other part of left lower leg with fat layer exposed: Secondary | ICD-10-CM | POA: Diagnosis not present

## 2020-10-04 DIAGNOSIS — L97222 Non-pressure chronic ulcer of left calf with fat layer exposed: Secondary | ICD-10-CM | POA: Diagnosis not present

## 2020-10-04 NOTE — Progress Notes (Signed)
ATIYA, YERA T ([id number]) . Visit Report for 10/04/2020 Abuse/Suicide Risk Screen Details Patient Name: Date of Service: Leah Cater NNE T. 10/04/2020 2:45 PM Medical Record Number: [id number] Patient Account Number: [id number] Date of Birth/Sex: Treating RN: 09-Sep-1929 (87 y.o. [name] Primary Care Demitra Danley: Pricilla Holm Other Clinician: Referring Kollyn Lingafelter: Treating Jamari Diana/Extender: Charlie Pitter in Treatment: 0 Abuse/Suicide Risk Screen Items Answer ABUSE RISK SCREEN: Has anyone close to you tried to hurt or harm you recentlyo No Do you feel uncomfortable with anyone in your familyo No Has anyone forced you do things that you didnt want to doo No Electronic Signature(s) Signed: 10/04/2020 5:41:41 PM By: Levan Hurst RN, BSN Entered By: Levan Hurst on 10/04/2020 15:45:51 -------------------------------------------------------------------------------- Activities of Daily Living Details Patient Name: Date of Service: Leah Cater NNE T. 10/04/2020 2:45 PM Medical Record Number: [id number] Patient Account Number: [id number] Date of Birth/Sex: Treating RN: 29-Feb-1930 (87 y.o. [name] Primary Care Emmanual Gauthreaux: Pricilla Holm Other Clinician: Referring Konnie Noffsinger: Treating Ofilia Rayon/Extender: Charlie Pitter in Treatment: 0 Activities of Daily Living Items Answer Activities of Daily Living (Please select one for each item) Drive Automobile Completely Able T Medications ake Completely Able Use T elephone Completely Able Care for Appearance Completely Able Use T oilet Completely Able Bath / Shower Completely Able Dress Self Completely Able Feed Self Completely Able Walk Completely Able Get In / Out Bed Completely Able Housework Completely Able Prepare Meals Completely Able Handle Money Completely Able Shop for Self Completely Able Electronic Signature(s) Signed: 10/04/2020 5:41:41 PM  By: Levan Hurst RN, BSN Entered By: Levan Hurst on 10/04/2020 15:46:09 -------------------------------------------------------------------------------- Education Screening Details Patient Name: Date of Service: Leah Cater NNE T. 10/04/2020 2:45 PM Medical Record Number: [id number] Patient Account Number: [id number] Date of Birth/Sex: Treating RN: June 08, 1929 (87 y.o. [name] Primary Care Raizel Wesolowski: Pricilla Holm Other Clinician: Referring Durinda Buzzelli: Treating Burrell Hodapp/Extender: Charlie Pitter in Treatment: 0 Primary Learner Assessed: Patient Learning Preferences/Education Level/Primary Language Learning Preference: Explanation, Demonstration, Printed Material Highest Education Level: College or Above Preferred Language: English Cognitive Barrier Language Barrier: No Translator Needed: No Memory Deficit: No Emotional Barrier: No Cultural/Religious Beliefs Affecting Medical Care: No Physical Barrier Impaired Vision: No Impaired Hearing: No Decreased Hand dexterity: No Knowledge/Comprehension Knowledge Level: High Comprehension Level: High Ability to understand written instructions: High Ability to understand verbal instructions: High Motivation Anxiety Level: Calm Cooperation: Cooperative Education Importance: Acknowledges Need Interest in Health Problems: Asks Questions Perception: Coherent Willingness to Engage in Self-Management High Activities: Readiness to Engage in Self-Management High Activities: Electronic Signature(s) Signed: 10/04/2020 5:41:41 PM By: Levan Hurst RN, BSN Entered By: Levan Hurst on 10/04/2020 15:46:27 -------------------------------------------------------------------------------- Fall Risk Assessment Details Patient Name: Date of Service: Leah Cater NNE T. 10/04/2020 2:45 PM Medical Record Number: [id number] Patient Account Number: [id number] Date of Birth/Sex: Treating RN: 1929-12-03  (87 y.o. [name] Primary Care Vannah Nadal: Pricilla Holm Other Clinician: Referring Khaila Velarde: Treating Kealani Leckey/Extender: Charlie Pitter in Treatment: 0 Fall Risk Assessment Items Have you had 2 or more falls in the last 12 monthso 0 No Have you had any fall that resulted in injury in the last 12 monthso 0 No FALLS RISK SCREEN History of falling - immediate or within 3 months 0 No Secondary diagnosis (Do you have 2 or more medical diagnoseso) 15 Yes Ambulatory aid None/bed rest/wheelchair/nurse 0 Yes Crutches/cane/walker 0 No Furniture 0 No Intravenous therapy Access/Saline/Heparin Lock 0 No Gait/Transferring Normal/ bed rest/ wheelchair  0 Yes Weak (short steps with or without shuffle, stooped but able to lift head while walking, may seek 0 No support from furniture) Impaired (short steps with shuffle, may have difficulty arising from chair, head down, impaired 0 No balance) Mental Status Oriented to own ability 0 Yes Electronic Signature(s) Signed: 10/04/2020 5:41:41 PM By: Levan Hurst RN, BSN Entered By: Levan Hurst on 10/04/2020 15:46:36 -------------------------------------------------------------------------------- Foot Assessment Details Patient Name: Date of Service: Leah Cater NNE T. 10/04/2020 2:45 PM Medical Record Number: [id number] Patient Account Number: [id number] Date of Birth/Sex: Treating RN: [birth year] (87 y.o. [name] Primary Care Yanice Maqueda: Pricilla Holm Other Clinician: Referring Phineas Mcenroe: Treating Valisha Heslin/Extender: Charlie Pitter in Treatment: 0 Foot Assessment Items Site Locations + = Sensation present, - = Sensation absent, C = Callus, U = Ulcer R = Redness, W = Warmth, M = Maceration, PU = Pre-ulcerative lesion F = Fissure, S = Swelling, D = Dryness Assessment Right: Left: Other Deformity: No No Prior Foot Ulcer: No No Prior Amputation: No No Charcot  Joint: No No Ambulatory Status: Ambulatory Without Help Gait: Steady Electronic Signature(s) Signed: 10/04/2020 5:41:41 PM By: Levan Hurst RN, BSN Entered By: Levan Hurst on 10/04/2020 15:49:59 -------------------------------------------------------------------------------- Nutrition Risk Screening Details Patient Name: Date of Service: Leah Cater NNE T. 10/04/2020 2:45 PM Medical Record Number: [id number] Patient Account Number: [id number] Date of Birth/Sex: Treating RN: July 15, 1929 (87 y.o. [name] Primary Care Sabreena Vogan: Pricilla Holm Other Clinician: Referring Fatime Biswell: Treating Marifer Hurd/Extender: Charlie Pitter in Treatment: 0 Height (in): 60 Weight (lbs): 131 Body Mass Index (BMI): 25.6 Nutrition Risk Screening Items Score Screening NUTRITION RISK SCREEN: I have an illness or condition that made me change the kind and/or amount of food I eat 0 No I eat fewer than two meals per day 0 No I eat few fruits and vegetables, or milk products 0 No I have three or more drinks of beer, liquor or wine almost every day 0 No I have tooth or mouth problems that make it hard for me to eat 0 No I don't always have enough money to buy the food I need 0 No I eat alone most of the time 0 No I take three or more different prescribed or over-the-counter drugs a day 1 Yes Without wanting to, I have lost or gained 10 pounds in the last six months 0 No I am not always physically able to shop, cook and/or feed myself 0 No Nutrition Protocols Good Risk Protocol Moderate Risk Protocol 0 Provide education on nutrition High Risk Proctocol Risk Level: Good Risk Score: 1 Electronic Signature(s) Signed: 10/04/2020 5:41:41 PM By: Levan Hurst RN, BSN Entered By: Levan Hurst on 10/04/2020 15:46:44

## 2020-10-04 NOTE — Progress Notes (Addendum)
Leah [name] ([id number]) . Visit Report for 10/04/2020 Allergy List Details Patient Name: Date of Service: Leah [name]. 10/04/2020 2:45 PM Medical Record Number: [id number] Patient Account Number: [id number] Date of Birth/Sex: Treating RN: May 30, 1930 (87 y.o. Leah [name] Primary Care Leah [name]: Pricilla Holm Other Clinician: Referring Leah [name]: Treating Leah [name]/Extender: Leah [name] in Treatment: 0 Allergies Active Allergies Sulfa (Sulfonamide Antibiotics) Reaction: rash Severity: Moderate sulfasalazine Reaction: hives, rash Severity: Moderate nabumetone Reaction: "feels weird" naproxen Reaction: hives tramadol Reaction: nausea/vomiting Allergy Notes Electronic Signature(s) Signed: 10/04/2020 5:41:41 PM By: Levan Hurst RN, BSN Entered By: Levan Hurst on 10/04/2020 15:41:01 -------------------------------------------------------------------------------- Arrival Information Details Patient Name: Date of Service: Leah [name]. 10/04/2020 2:45 PM Medical Record Number: [id number] Patient Account Number: [id number] Date of Birth/Sex: Treating RN: 1929/12/01 (87 y.o. Leah [name] Primary Care Jaidyn Kuhl: Pricilla Holm Other Clinician: Referring Nahima Ales: Treating Jackelyne Sayer/Extender: Leah [name] in Treatment: 0 Visit Information Patient Arrived: Leah [name] Time: 15:15 Accompanied By: self Transfer Assistance: None Patient Identification Verified: Yes Secondary Verification Process Completed: Yes Patient Requires Transmission-Based Precautions: No Patient Has Alerts: No Electronic Signature(s) Signed: 10/04/2020 5:41:41 PM By: Levan Hurst RN, BSN Entered By: Levan Hurst on 10/04/2020 15:38:22 -------------------------------------------------------------------------------- Clinic Level of Care Assessment Details Patient Name: Date of Service: Leah [name].  10/04/2020 2:45 PM Medical Record Number: [id number] Patient Account Number: [id number] Date of Birth/Sex: Treating RN: [birth year] (87 y.o. Leah [name] Primary Care Mase Dhondt: Pricilla Holm Other Clinician: Referring Kila Godina: Treating Paytyn Mesta/Extender: Leah [name] in Treatment: 0 Clinic Level of Care Assessment Items TOOL 1 Quantity Score X- 1 0 Use when EandM and Procedure is performed on INITIAL visit ASSESSMENTS - Nursing Assessment / Reassessment X- 1 20 General Physical Exam (combine w/ comprehensive assessment (listed just below) when performed on new pt. evals) X- 1 25 Comprehensive Assessment (HX, ROS, Risk Assessments, Wounds Hx, etc.) ASSESSMENTS - Wound and Skin Assessment / Reassessment X- 1 10 Dermatologic / Skin Assessment (not related to wound area) ASSESSMENTS - Ostomy and/or Continence Assessment and Care []  - 0 Incontinence Assessment and Management []  - 0 Ostomy Care Assessment and Management (repouching, etc.) PROCESS - Coordination of Care []  - 0 Simple Patient / Family Education for ongoing care X- 1 20 Complex (extensive) Patient / Family Education for ongoing care X- 1 10 Staff obtains Programmer, systems, Records, [name] Results / Process Orders est []  - 0 Staff telephones HHA, Nursing Homes / Clarify orders / etc []  - 0 Routine Transfer to another Facility (non-emergent condition) []  - 0 Routine Hospital Admission (non-emergent condition) X- 1 15 New Admissions / Biomedical engineer / Ordering NPWT Apligraf, etc. , []  - 0 Emergency Hospital Admission (emergent condition) PROCESS - Special Needs []  - 0 Pediatric / Minor Patient Management []  - 0 Isolation Patient Management []  - 0 Hearing / Language / Visual special needs []  - 0 Assessment of Community assistance (transportation, D/C planning, etc.) []  - 0 Additional assistance / Altered mentation []  - 0 Support Surface(s) Assessment (bed, cushion, seat,  etc.) INTERVENTIONS - Miscellaneous []  - 0 External ear exam []  - 0 Patient Transfer (multiple staff / Civil Service fast streamer / Similar devices) []  - 0 Simple Staple / Suture removal (25 or less) []  - 0 Complex Staple / Suture removal (26 or more) []  - 0 Hypo/Hyperglycemic Management (do not check if billed separately) X- 1 15 Ankle / Brachial Index (ABI) - do not check if billed separately  Has the patient been seen at the hospital within the last three years: Yes Total Score: 115 Level Of Care: New/Established - Level 3 Electronic Signature(s) Signed: 10/04/2020 6:21:36 PM By: Deon Pilling Entered By: Deon Pilling on 10/04/2020 16:18:57 -------------------------------------------------------------------------------- Compression Therapy Details Patient Name: Date of Service: Leah [name]. 10/04/2020 2:45 PM Medical Record Number: [id number] Patient Account Number: [id number] Date of Birth/Sex: Treating RN: Nov 21, 1929 (87 y.o. Leah [name] Primary Care Radwan Cowley: Pricilla Holm Other Clinician: Referring Adedamola Seto: Treating Miana Politte/Extender: Leah [name] in Treatment: 0 Compression Therapy Performed for Wound Assessment: Wound #1 Right,Anterior Lower Leg Performed By: Clinician Baruch Gouty, RN Compression Type: Three Layer Post Procedure Diagnosis Same as Pre-procedure Electronic Signature(s) Signed: 10/04/2020 6:21:36 PM By: Deon Pilling Entered By: Deon Pilling on 10/04/2020 16:18:23 -------------------------------------------------------------------------------- Compression Therapy Details Patient Name: Date of Service: Leah [name]. 10/04/2020 2:45 PM Medical Record Number: [id number] Patient Account Number: [id number] Date of Birth/Sex: Treating RN: February 05, 1930 (87 y.o. Leah [name] Primary Care Arlisha Patalano: Pricilla Holm Other Clinician: Referring Gaston Dase: Treating Lorence Nagengast/Extender: Leah [name] in Treatment: 0 Compression Therapy Performed for Wound Assessment: Wound #4 Left,Lateral Lower Leg Performed By: Clinician Baruch Gouty, RN Compression Type: Three Layer Post Procedure Diagnosis Same as Pre-procedure Electronic Signature(s) Signed: 10/04/2020 6:21:36 PM By: Deon Pilling Entered By: Deon Pilling on 10/04/2020 16:18:23 -------------------------------------------------------------------------------- Compression Therapy Details Patient Name: Date of Service: Leah [name]. 10/04/2020 2:45 PM Medical Record Number: [id number] Patient Account Number: [id number] Date of Birth/Sex: Treating RN: March 22, 1930 (87 y.o. Leah [name] Primary Care Trey Bebee: Pricilla Holm Other Clinician: Referring Tonatiuh Mallon: Treating Maxxwell Edgett/Extender: Leah [name] in Treatment: 0 Compression Therapy Performed for Wound Assessment: Wound #3 Left,Distal,Posterior Lower Leg Performed By: Clinician Baruch Gouty, RN Compression Type: Three Layer Post Procedure Diagnosis Same as Pre-procedure Electronic Signature(s) Signed: 10/04/2020 6:21:36 PM By: Deon Pilling Entered By: Deon Pilling on 10/04/2020 16:18:23 -------------------------------------------------------------------------------- Compression Therapy Details Patient Name: Date of Service: Leah [name]. 10/04/2020 2:45 PM Medical Record Number: [id number] Patient Account Number: [id number] Date of Birth/Sex: Treating RN: 26-Jun-1929 (87 y.o. Leah [name] Primary Care Zachariah Pavek: Pricilla Holm Other Clinician: Referring Alger Kerstein: Treating Lamoine Fredricksen/Extender: Leah [name] in Treatment: 0 Compression Therapy Performed for Wound Assessment: Wound #2 Left,Proximal,Posterior Lower Leg Performed By: Clinician Baruch Gouty, RN Compression Type: Three Layer Post Procedure Diagnosis Same as Pre-procedure Electronic  Signature(s) Signed: 10/04/2020 6:21:36 PM By: Deon Pilling Entered By: Deon Pilling on 10/04/2020 16:18:23 -------------------------------------------------------------------------------- Encounter Discharge Information Details Patient Name: Date of Service: Leah [name], Leah [name]. 10/04/2020 2:45 PM Medical Record Number: [id number] Patient Account Number: [id number] Date of Birth/Sex: Treating RN: 02-Jan-1930 (87 y.o. Leah [name] Primary Care Keylie Beavers: Pricilla Holm Other Clinician: Referring Jden Want: Treating Areliz Rothman/Extender: Leah [name] in Treatment: 0 Encounter Discharge Information Items Discharge Condition: Stable Ambulatory Status: Cane Discharge Destination: Home Transportation: Private Auto Accompanied By: alone Schedule Follow-up Appointment: Yes Clinical Summary of Care: Patient Declined Electronic Signature(s) Signed: 10/04/2020 5:41:41 PM By: Levan Hurst RN, BSN Signed: 10/04/2020 5:41:41 PM By: Levan Hurst RN, BSN Entered By: Levan Hurst on 10/04/2020 17:14:52 -------------------------------------------------------------------------------- Lower Extremity Assessment Details Patient Name: Date of Service: Leah [name]. 10/04/2020 2:45 PM Medical Record Number: [id number] Patient Account Number: [id number] Date of Birth/Sex: Treating RN: 1929/12/31 (87 y.o. Leah [name] Primary Care Devion Chriscoe: Pricilla Holm Other Clinician: Referring Myna Freimark: Treating Jasiel Apachito/Extender: Leah [name] in  Treatment: 0 Edema Assessment Assessed: [Left: No] [Right: No] Edema: [Left: Yes] [Right: Yes] Calf Left: Right: Point of Measurement: 29 cm From Medial Instep 34 cm 38 cm Ankle Left: Right: Point of Measurement: 10 cm From Medial Instep 21 cm 20.8 cm Knee To Floor Left: Right: From Medial Instep 36 cm 36 cm Vascular Assessment Pulses: Dorsalis Pedis Palpable: [Left:Yes]  [Right:Yes] Blood Pressure: Brachial: [Left:151] [Right:151] Ankle: [Left:Dorsalis Pedis: 160 1.06] [Right:Dorsalis Pedis: 170 1.13] Electronic Signature(s) Signed: 10/04/2020 5:41:41 PM By: Levan Hurst RN, BSN Entered By: Levan Hurst on 10/04/2020 15:59:02 -------------------------------------------------------------------------------- Multi Wound Chart Details Patient Name: Date of Service: Leah [name], Leah [name]. 10/04/2020 2:45 PM Medical Record Number: [id number] Patient Account Number: [id number] Date of Birth/Sex: Treating RN: [birth year] (87 y.o. Leah [name], Leah [name] Primary Care Natalie Mceuen: Pricilla Holm Other Clinician: Referring Niyana Chesbro: Treating Kearia Yin/Extender: Leah [name] in Treatment: 0 Vital Signs Height(in): 60 Pulse(bpm): 78 Weight(lbs): 131 Blood Pressure(mmHg): 151/75 Body Mass Index(BMI): 26 Temperature(F): 98.9 Respiratory Rate(breaths/min): 18 Photos: [1:No Photos Right, Anterior Lower Leg] [2:No Photos Left, Proximal, Posterior Lower Leg Left, Distal, Posterior Lower Leg] [3:No Photos] Wound Location: [1:Gradually Appeared] [2:Gradually Appeared] [3:Gradually Appeared] Wounding Event: [1:Venous Leg Ulcer] [2:Venous Leg Ulcer] [3:Venous Leg Ulcer] Primary Etiology: [1:Peripheral Venous Disease,] [2:Peripheral Venous Disease,] [3:Peripheral Venous Disease,] Comorbid History: [1:Osteoarthritis 06/02/2020] [2:Osteoarthritis 06/02/2020] [3:Osteoarthritis 06/02/2020] Date Acquired: [1:0] [2:0] [3:0] Weeks of Treatment: [1:Open] [2:Open] [3:Open] Wound Status: [1:14x13x0.1] [2:0.7x0.6x0.1] [3:1x0.7x0.1] Measurements L x W x D (cm) [1:142.942] [2:0.33] [3:0.55] A (cm) : rea [1:14.294] [2:0.033] [3:0.055] Volume (cm) : [1:Full Thickness Without Exposed] [2:Full Thickness Without Exposed] [3:Full Thickness Without Exposed] Classification: [1:Support Structures Large] [2:Support Structures Medium] [3:Support Structures  Medium] Exudate Amount: [1:Serous] [2:Serosanguineous] [3:Serous] Exudate Type: [1:amber] [2:red, brown] [3:amber] Exudate Color: [1:N/A] [2:Flat and Intact] [3:Flat and Intact] Wound Margin: [1:Medium (34-66%)] [2:Large (67-100%)] [3:Large (67-100%)] Granulation Amount: [1:Pink, Pale] [2:Pink] [3:Pink] Granulation Quality: [1:Medium (34-66%)] [2:None Present (0%)] [3:None Present (0%)] Necrotic Amount: [1:Fat Layer (Subcutaneous Tissue): Yes Fat Layer (Subcutaneous Tissue): Yes Fat Layer (Subcutaneous Tissue): Yes] Exposed Structures: [1:Fascia: No Tendon: No Muscle: No Joint: No Bone: No None] [2:Fascia: No Tendon: No Muscle: No Joint: No Bone: No None] [3:Fascia: No Tendon: No Muscle: No Joint: No Bone: No None] Epithelialization: [1:Compression Therapy] [2:Compression Therapy] [3:Compression [place] Wound Number: 4 N/A N/A Photos: No Photos N/A N/A Left, Lateral Lower Leg N/A N/A Wound Location: Gradually Appeared N/A N/A Wounding Event: Venous Leg Ulcer N/A N/A Primary Etiology: Peripheral Venous Disease, N/A N/A Comorbid History: Osteoarthritis 06/02/2020 N/A N/A Date Acquired: 0 N/A N/A Weeks of Treatment: Open N/A N/A Wound Status: 0.3x0.3x0.1 N/A N/A Measurements L x W x D (cm) 0.071 N/A N/A A (cm) : rea 0.007 N/A N/A Volume (cm) : Full Thickness Without Exposed N/A N/A Classification: Support Structures Medium N/A N/A Exudate Amount: Serous N/A N/A Exudate Type: amber N/A N/A Exudate Color: Flat and Intact N/A N/A Wound Margin: Large (67-100%) N/A N/A Granulation Amount: Red N/A N/A Granulation Quality: None Present (0%) N/A N/A Necrotic Amount: Fat Layer (Subcutaneous Tissue): Yes N/A N/A Exposed Structures: Fascia: No Tendon: No Muscle: No Joint: No Bone: No None N/A N/A Epithelialization: Compression Therapy N/A N/A Procedures Performed: Treatment Notes Electronic Signature(s) Signed: 10/04/2020 5:06:47 PM By: Linton Ham MD Signed:  10/04/2020 6:21:36 PM By: Deon Pilling Entered By: Linton Ham on 10/04/2020 16:45:08 -------------------------------------------------------------------------------- Multi-Disciplinary Care Plan Details Patient Name: Date of Service: Leah [name]. 10/04/2020 2:45 PM Medical Record Number: [id number] Patient  Account Number: [id number] Date of Birth/Sex: Treating RN: [birth year] (87 y.o. Leah [name], Leah [name] Primary Care Hilliard Borges: Pricilla Holm Other Clinician: Referring Rinnah Peppel: Treating Giannis Corpuz/Extender: Leah [name] in Treatment: 0 Active Inactive Abuse / Safety / Falls / Self Care Management Nursing Diagnoses: Potential for falls Potential for injury related to falls Goals: Patient will remain injury free related to falls Date Initiated: 10/04/2020 Target Resolution Date: 12/07/2020 Goal Status: Active Patient/caregiver will verbalize understanding of skin care regimen Date Initiated: 10/04/2020 Target Resolution Date: 12/07/2020 Goal Status: Active Interventions: Assess fall risk on admission and as needed Assess: immobility, friction, shearing, incontinence upon admission and as needed Provide education on fall prevention Notes: Orientation to the Wound Care Program Nursing Diagnoses: Knowledge deficit related to the wound healing center program Goals: Patient/caregiver will verbalize understanding of the [facility] Program Date Initiated: 10/04/2020 Target Resolution Date: 11/02/2020 Goal Status: Active Interventions: Provide education on orientation to the wound center Notes: Venous Leg Ulcer Nursing Diagnoses: Knowledge deficit related to disease process and management Potential for venous Insuffiency (use before diagnosis confirmed) Goals: Patient/caregiver will verbalize understanding of disease process and disease management Date Initiated: 10/04/2020 Target Resolution Date: 11/02/2020 Goal Status:  Active Interventions: Assess peripheral edema status every visit. Provide education on venous insufficiency Notes: Electronic Signature(s) Signed: 10/04/2020 6:21:36 PM By: Deon Pilling Entered By: Deon Pilling on 10/04/2020 16:14:45 -------------------------------------------------------------------------------- Pain Assessment Details Patient Name: Date of Service: Leah [name]. 10/04/2020 2:45 PM Medical Record Number: [id number] Patient Account Number: [id number] Date of Birth/Sex: Treating RN: 01-06-1930 (87 y.o. Leah [name] Primary Care Ellory Khurana: Pricilla Holm Other Clinician: Referring Sya Nestler: Treating Nathon Stefanski/Extender: Leah [name] in Treatment: 0 Active Problems Location of Pain Severity and Description of Pain Patient Has Paino No Site Locations Pain Management and Medication Current Pain Management: Electronic Signature(s) Signed: 10/04/2020 5:41:41 PM By: Levan Hurst RN, BSN Entered By: Levan Hurst on 10/04/2020 16:03:33 -------------------------------------------------------------------------------- Patient/Caregiver Education Details Patient Name: Date of Service: Leah [name] 5/5/2022andnbsp2:45 PM Medical Record Number: [id number] Patient Account Number: [id number] Date of Birth/Gender: Treating RN: 05/07/1930 (87 y.o. Leah [name] Primary Care Physician: Pricilla Holm Other Clinician: Referring Physician: Treating Physician/Extender: Leah [name] in Treatment: 0 Education Assessment Education Provided To: Patient Education Topics Provided Welcome [name] [facility] [place]: o Handouts: Welcome [name] [facility] [place] o Methods: Explain/Verbal Responses: Reinforcements needed Electronic Signature(s) Signed: 10/04/2020 6:21:36 PM By: Deon Pilling Entered By: Deon Pilling on 10/04/2020  16:14:53 -------------------------------------------------------------------------------- Wound Assessment Details Patient Name: Date of Service: Leah [name]. 10/04/2020 2:45 PM Medical Record Number: [id number] Patient Account Number: [id number] Date of Birth/Sex: Treating RN: Jun 02, 1930 (87 y.o. Leah [name] Primary Care Gwyn Hieronymus: Pricilla Holm Other Clinician: Referring Adiah Guereca: Treating Sorrel Cassetta/Extender: Leah [name] in Treatment: 0 Wound Status Wound Number: 1 Primary Etiology: Venous Leg Ulcer Wound Location: Right, Anterior Lower Leg Wound Status: Open Wounding Event: Gradually Appeared Comorbid History: Peripheral Venous Disease, Osteoarthritis Date Acquired: 06/02/2020 Weeks Of Treatment: 0 Clustered Wound: No Photos Wound Measurements Length: (cm) 14 Width: (cm) 13 Depth: (cm) 0.1 Area: (cm) 142.942 Volume: (cm) 14.294 % Reduction in Area: 0% % Reduction in Volume: 0% Epithelialization: None Tunneling: No Undermining: No Wound Description Classification: Full Thickness Without Exposed Support Structures Exudate Amount: Large Exudate Type: Serous Exudate Color: amber Foul Odor After Cleansing: No Slough/Fibrino Yes Wound Bed Granulation Amount: Medium (34-66%) Exposed Structure Granulation Quality: Pink, Pale Fascia Exposed:  No Necrotic Amount: Medium (34-66%) Fat Layer (Subcutaneous Tissue) Exposed: Yes Necrotic Quality: Adherent Slough Tendon Exposed: No Muscle Exposed: No Joint Exposed: No Bone Exposed: No Treatment Notes Wound #1 (Lower Leg) Wound Laterality: Right, Anterior Cleanser Wound Cleanser Discharge Instruction: Cleanse the wound with wound cleanser prior to applying a clean dressing using gauze sponges, not tissue or cotton balls. Soap and Water Discharge Instruction: May shower and wash wound with dial antibacterial soap and water prior to dressing change. Peri-Wound  Care Triamcinolone 15 (g) Discharge Instruction: Use triamcinolone 15 (g) as directed Sween Lotion (Moisturizing lotion) Discharge Instruction: Apply moisturizing lotion as directed Topical Primary Dressing Hydrofera Blue Classic Foam, 4x4 in Discharge Instruction: Moisten with saline prior to applying to wound bed Secondary Dressing Woven Gauze Sponge, Non-Sterile 4x4 in Discharge Instruction: Apply over primary dressing as directed. ABD Pad, 8x10 Discharge Instruction: Apply over primary dressing as directed. Secured With Compression Wrap ThreePress (3 layer compression wrap) Discharge Instruction: Apply three layer compression as directed. Compression Stockings Add-Ons Electronic Signature(s) Signed: 10/05/2020 4:45:41 PM By: Sandre Kitty Signed: 10/08/2020 5:39:20 PM By: Levan Hurst RN, BSN Previous Signature: 10/04/2020 5:41:41 PM Version By: Levan Hurst RN, BSN Entered By: Sandre Kitty on 10/05/2020 16:39:38 -------------------------------------------------------------------------------- Wound Assessment Details Patient Name: Date of Service: Leah [name]. 10/04/2020 2:45 PM Medical Record Number: [id number] Patient Account Number: [id number] Date of Birth/Sex: Treating RN: 22-Aug-1929 (87 y.o. Leah [name] Primary Care Coltin Casher: Pricilla Holm Other Clinician: Referring Merriam Brandner: Treating Stella Bortle/Extender: Leah [name] in Treatment: 0 Wound Status Wound Number: 2 Primary Etiology: Venous Leg Ulcer Wound Location: Left, Proximal, Posterior Lower Leg Wound Status: Open Wounding Event: Gradually Appeared Comorbid History: Peripheral Venous Disease, Osteoarthritis Date Acquired: 06/02/2020 Weeks Of Treatment: 0 Clustered Wound: No Photos Wound Measurements Length: (cm) 0.7 Width: (cm) 0.6 Depth: (cm) 0.1 Area: (cm) 0.33 Volume: (cm) 0.033 % Reduction in Area: 0% % Reduction in Volume:  0% Epithelialization: None Tunneling: No Undermining: No Wound Description Classification: Full Thickness Without Exposed Support Structures Wound Margin: Flat and Intact Exudate Amount: Medium Exudate Type: Serosanguineous Exudate Color: red, brown Foul Odor After Cleansing: No Slough/Fibrino No Wound Bed Granulation Amount: Large (67-100%) Exposed Structure Granulation Quality: Pink Fascia Exposed: No Necrotic Amount: None Present (0%) Fat Layer (Subcutaneous Tissue) Exposed: Yes Tendon Exposed: No Muscle Exposed: No Joint Exposed: No Bone Exposed: No Treatment Notes Wound #2 (Lower Leg) Wound Laterality: Left, Posterior, Proximal Cleanser Wound Cleanser Discharge Instruction: Cleanse the wound with wound cleanser prior to applying a clean dressing using gauze sponges, not tissue or cotton balls. Soap and Water Discharge Instruction: May shower and wash wound with dial antibacterial soap and water prior to dressing change. Peri-Wound Care Triamcinolone 15 (g) Discharge Instruction: Use triamcinolone 15 (g) as directed Sween Lotion (Moisturizing lotion) Discharge Instruction: Apply moisturizing lotion as directed Topical Primary Dressing Hydrofera Blue Classic Foam, 4x4 in Discharge Instruction: Moisten with saline prior to applying to wound bed Secondary Dressing Woven Gauze Sponge, Non-Sterile 4x4 in Discharge Instruction: Apply over primary dressing as directed. ABD Pad, 8x10 Discharge Instruction: Apply over primary dressing as directed. Secured With Compression Wrap ThreePress (3 layer compression wrap) Discharge Instruction: Apply three layer compression as directed. Compression Stockings Add-Ons Electronic Signature(s) Signed: 10/05/2020 4:45:41 PM By: Sandre Kitty Signed: 10/08/2020 5:39:20 PM By: Levan Hurst RN, BSN Previous Signature: 10/04/2020 5:41:41 PM Version By: Levan Hurst RN, BSN Entered By: Sandre Kitty on 10/05/2020  16:39:55 -------------------------------------------------------------------------------- Wound Assessment Details Patient Name: Date of  Service: Leah [name]. 10/04/2020 2:45 PM Medical Record Number: [id number] Patient Account Number: [id number] Date of Birth/Sex: Treating RN: [birth year] (87 y.o. Leah [name] Primary Care Eutha Cude: Pricilla Holm Other Clinician: Referring Sevanna Ballengee: Treating Marialy Urbanczyk/Extender: Leah [name] in Treatment: 0 Wound Status Wound Number: 3 Primary Etiology: Venous Leg Ulcer Wound Location: Left, Distal, Posterior Lower Leg Wound Status: Open Wounding Event: Gradually Appeared Comorbid History: Peripheral Venous Disease, Osteoarthritis Date Acquired: 06/02/2020 Weeks Of Treatment: 0 Clustered Wound: No Photos Wound Measurements Length: (cm) 1 Width: (cm) 0.7 Depth: (cm) 0.1 Area: (cm) 0.55 Volume: (cm) 0.055 % Reduction in Area: 0% % Reduction in Volume: 0% Epithelialization: None Tunneling: No Undermining: No Wound Description Classification: Full Thickness Without Exposed Support Structures Wound Margin: Flat and Intact Exudate Amount: Medium Exudate Type: Serous Exudate Color: amber Foul Odor After Cleansing: No Slough/Fibrino No Wound Bed Granulation Amount: Large (67-100%) Exposed Structure Granulation Quality: Pink Fascia Exposed: No Necrotic Amount: None Present (0%) Fat Layer (Subcutaneous Tissue) Exposed: Yes Tendon Exposed: No Muscle Exposed: No Joint Exposed: No Bone Exposed: No Treatment Notes Wound #3 (Lower Leg) Wound Laterality: Left, Posterior, Distal Cleanser Wound Cleanser Discharge Instruction: Cleanse the wound with wound cleanser prior to applying a clean dressing using gauze sponges, not tissue or cotton balls. Soap and Water Discharge Instruction: May shower and wash wound with dial antibacterial soap and water prior to dressing change. Peri-Wound  Care Triamcinolone 15 (g) Discharge Instruction: Use triamcinolone 15 (g) as directed Sween Lotion (Moisturizing lotion) Discharge Instruction: Apply moisturizing lotion as directed Topical Primary Dressing Hydrofera Blue Classic Foam, 4x4 in Discharge Instruction: Moisten with saline prior to applying to wound bed Secondary Dressing Woven Gauze Sponge, Non-Sterile 4x4 in Discharge Instruction: Apply over primary dressing as directed. ABD Pad, 8x10 Discharge Instruction: Apply over primary dressing as directed. Secured With Compression Wrap ThreePress (3 layer compression wrap) Discharge Instruction: Apply three layer compression as directed. Compression Stockings Add-Ons Electronic Signature(s) Signed: 10/05/2020 4:45:41 PM By: Sandre Kitty Signed: 10/08/2020 5:39:20 PM By: Levan Hurst RN, BSN Previous Signature: 10/04/2020 5:41:41 PM Version By: Levan Hurst RN, BSN Entered By: Sandre Kitty on 10/05/2020 16:40:12 -------------------------------------------------------------------------------- Wound Assessment Details Patient Name: Date of Service: Leah [name]. 10/04/2020 2:45 PM Medical Record Number: [id number] Patient Account Number: [id number] Date of Birth/Sex: Treating RN: April 16, 1930 (87 y.o. Leah [name] Primary Care Volney Reierson: Pricilla Holm Other Clinician: Referring Delsy Etzkorn: Treating Larren Copes/Extender: Leah [name] in Treatment: 0 Wound Status Wound Number: 4 Primary Etiology: Venous Leg Ulcer Wound Location: Left, Lateral Lower Leg Wound Status: Open Wounding Event: Gradually Appeared Comorbid History: Peripheral Venous Disease, Osteoarthritis Date Acquired: 06/02/2020 Weeks Of Treatment: 0 Clustered Wound: No Photos Wound Measurements Length: (cm) 0.3 Width: (cm) 0.3 Depth: (cm) 0.1 Area: (cm) 0.071 Volume: (cm) 0.007 % Reduction in Area: 0% % Reduction in Volume: 0% Epithelialization:  None Tunneling: No Undermining: No Wound Description Classification: Full Thickness Without Exposed Support Structures Wound Margin: Flat and Intact Exudate Amount: Medium Exudate Type: Serous Exudate Color: amber Foul Odor After Cleansing: No Slough/Fibrino No Wound Bed Granulation Amount: Large (67-100%) Exposed Structure Granulation Quality: Red Fascia Exposed: No Necrotic Amount: None Present (0%) Fat Layer (Subcutaneous Tissue) Exposed: Yes Tendon Exposed: No Muscle Exposed: No Joint Exposed: No Bone Exposed: No Treatment Notes Wound #4 (Lower Leg) Wound Laterality: Left, Lateral Cleanser Wound Cleanser Discharge Instruction: Cleanse the wound with wound cleanser prior to applying a clean dressing using gauze sponges, not tissue  or cotton balls. Soap and Water Discharge Instruction: May shower and wash wound with dial antibacterial soap and water prior to dressing change. Peri-Wound Care Triamcinolone 15 (g) Discharge Instruction: Use triamcinolone 15 (g) as directed Sween Lotion (Moisturizing lotion) Discharge Instruction: Apply moisturizing lotion as directed Topical Primary Dressing Hydrofera Blue Classic Foam, 4x4 in Discharge Instruction: Moisten with saline prior to applying to wound bed Secondary Dressing Woven Gauze Sponge, Non-Sterile 4x4 in Discharge Instruction: Apply over primary dressing as directed. ABD Pad, 8x10 Discharge Instruction: Apply over primary dressing as directed. Secured With Compression Wrap ThreePress (3 layer compression wrap) Discharge Instruction: Apply three layer compression as directed. Compression Stockings Add-Ons Electronic Signature(s) Signed: 10/05/2020 4:45:41 PM By: Sandre Kitty Signed: 10/08/2020 5:39:20 PM By: Levan Hurst RN, BSN Previous Signature: 10/04/2020 5:41:41 PM Version By: Levan Hurst RN, BSN Entered By: Sandre Kitty on 10/05/2020  16:40:45 -------------------------------------------------------------------------------- Vitals Details Patient Name: Date of Service: Leah [name], Leah [name]. 10/04/2020 2:45 PM Medical Record Number: [id number] Patient Account Number: [id number] Date of Birth/Sex: Treating RN: April 11, 1930 (87 y.o. Leah [name], Leah [name] Primary Care Ellard Nan: Pricilla Holm Other Clinician: Referring Sharmayne Jablon: Treating Manvi Guilliams/Extender: Leah [name] in Treatment: 0 Vital Signs Time Taken: 15:16 Temperature (F): 98.9 Height (in): 60 Pulse (bpm): 78 Source: Stated Respiratory Rate (breaths/min): 18 Weight (lbs): 131 Blood Pressure (mmHg): 151/75 Source: Stated Reference Range: 80 - 120 mg / dl Body Mass Index (BMI): 25.6 Electronic Signature(s) Signed: 10/04/2020 5:12:45 PM By: Sandre Kitty Entered By: Sandre Kitty on 10/04/2020 15:17:11

## 2020-10-04 NOTE — Progress Notes (Signed)
Leah [name], Leah [name] ([id number]) . Visit Report for 10/04/2020 Chief Complaint Document Details Patient Name: Date of Service: Leah [name]. 10/04/2020 2:45 PM Medical Record Number: [id number] Patient Account Number: [id number] Date of Birth/Sex: Treating RN: [birth year] (87 y.o. Leah [name] Primary Care Provider: Pricilla Holm Other Clinician: Referring Provider: Treating Provider/Extender: Charlie Pitter in Treatment: 0 Information Obtained from: Patient Chief Complaint 10/04/2020; patient is here for review of wounds on her bilateral lower legs right greater than left Electronic Signature(s) Signed: 10/04/2020 5:06:47 PM By: Linton Ham MD Entered By: Linton Ham on 10/04/2020 16:45:41 -------------------------------------------------------------------------------- HPI Details Patient Name: Date of Service: Leah [name]. 10/04/2020 2:45 PM Medical Record Number: [id number] Patient Account Number: [id number] Date of Birth/Sex: Treating RN: 10/16/1929 (87 y.o. Leah [name] Primary Care Provider: Pricilla Holm Other Clinician: Referring Provider: Treating Provider/Extender: Charlie Pitter in Treatment: 0 History of Present Illness HPI Description: ADMISSION 10/04/2020; This is an 86 year old woman who lives independently. Her husband was actually a longstanding patient in this clinic who passed away recently. She states her problem began in January with increased swelling in her legs she has a large area on the right anterior lower leg and 3 smaller areas on the left. She has a history of chronic venous insufficiency. I note that she had DVT studies on 03/29/2020 that were negative for DVT She was in her primary doctor's office late . in April and she was given Silvadene cream but she does not feel this has been helping. Recently she has been using Vaseline she has compression stockings but does  not wear them reliably including juxta lites which belonged to her husband Past medical history includes chronic venous insufficiency, stage III chronic kidney disease, sciatica, peripheral edema, pelvic fractures ABIs in our clinic were normal 1.13 on the right and 1.06 on the left Electronic Signature(s) Signed: 10/04/2020 5:06:47 PM By: Linton Ham MD Entered By: Linton Ham on 10/04/2020 16:49:04 -------------------------------------------------------------------------------- Physical Exam Details Patient Name: Date of Service: Leah [name]. 10/04/2020 2:45 PM Medical Record Number: [id number] Patient Account Number: [id number] Date of Birth/Sex: Treating RN: November 19, 1929 (87 y.o. Leah [name] Primary Care Provider: Pricilla Holm Other Clinician: Referring Provider: Treating Provider/Extender: Charlie Pitter in Treatment: 0 Constitutional Patient is hypertensive.. Pulse regular and within target range for patient.Marland Kitchen Respirations regular, non-labored and within target range.. Temperature is normal and within the target range for the patient.Marland Kitchen Appears in no distress. Cardiovascular Pedal pulses are easily palpable. 2-3+ pitting edema bilaterally below the knee with skin changes suggestive of chronic hemosiderin deposition. Notes Wound exam; the patient has small superficial areas on the left posterior x2 and the left lateral x1 On the right a more extensive wound area over the anterior tibia. Surface of this area is very fibrinous but I feel there is too much swelling to do a debridement here I will see how this does into next week when there is less edema fluid. Electronic Signature(s) Signed: 10/04/2020 5:06:47 PM By: Linton Ham MD Entered By: Linton Ham on 10/04/2020 16:53:50 -------------------------------------------------------------------------------- Physician Orders Details Patient Name: Date of Service: Leah Cater  NNE [name]. 10/04/2020 2:45 PM Medical Record Number: [id number] Patient Account Number: [id number] Date of Birth/Sex: Treating RN: 04/12/1930 (87 y.o. Leah [name] Primary Care Provider: Pricilla Holm Other Clinician: Referring Provider: Treating Provider/Extender: Charlie Pitter in Treatment: 0 Verbal / Phone Orders: No Diagnosis Coding Follow-up Appointments ppointment  in 1 week. - Dr. Dellia Nims Return A Bathing/ Shower/ Hygiene May shower with protection but do not get wound dressing(s) wet. - use a cast protector. Edema Control - Lymphedema / SCD / Other Elevate legs to the level of the heart or above for 30 minutes daily and/or when sitting, a frequency of: - throughout the day. Avoid standing for long periods of time. Exercise regularly Other Edema Control Orders/Instructions: - bring in juxtalite HD compression garment next week for left leg. Wound Treatment Wound #1 - Lower Leg Wound Laterality: Right, Anterior Cleanser: Soap and Water 1 x Per Week Discharge Instructions: May shower and wash wound with dial antibacterial soap and water prior to dressing change. Cleanser: Wound Cleanser 1 x Per Week Discharge Instructions: Cleanse the wound with wound cleanser prior to applying a clean dressing using gauze sponges, not tissue or cotton balls. Peri-Wound Care: Triamcinolone 15 (g) 1 x Per Week Discharge Instructions: Use triamcinolone 15 (g) as directed Peri-Wound Care: Sween Lotion (Moisturizing lotion) 1 x Per Week Discharge Instructions: Apply moisturizing lotion as directed Prim Dressing: Hydrofera Blue Classic Foam, 4x4 in 1 x Per Week ary Discharge Instructions: Moisten with saline prior to applying to wound bed Secondary Dressing: Woven Gauze Sponge, Non-Sterile 4x4 in 1 x Per Week Discharge Instructions: Apply over primary dressing as directed. Secondary Dressing: ABD Pad, 8x10 1 x Per Week Discharge Instructions: Apply over primary  dressing as directed. Compression Wrap: ThreePress (3 layer compression wrap) 1 x Per Week Discharge Instructions: Apply three layer compression as directed. Wound #2 - Lower Leg Wound Laterality: Left, Posterior, Proximal Cleanser: Soap and Water 1 x Per Week Discharge Instructions: May shower and wash wound with dial antibacterial soap and water prior to dressing change. Cleanser: Wound Cleanser 1 x Per Week Discharge Instructions: Cleanse the wound with wound cleanser prior to applying a clean dressing using gauze sponges, not tissue or cotton balls. Peri-Wound Care: Triamcinolone 15 (g) 1 x Per Week Discharge Instructions: Use triamcinolone 15 (g) as directed Peri-Wound Care: Sween Lotion (Moisturizing lotion) 1 x Per Week Discharge Instructions: Apply moisturizing lotion as directed Prim Dressing: Hydrofera Blue Classic Foam, 4x4 in 1 x Per Week ary Discharge Instructions: Moisten with saline prior to applying to wound bed Secondary Dressing: Woven Gauze Sponge, Non-Sterile 4x4 in 1 x Per Week Discharge Instructions: Apply over primary dressing as directed. Secondary Dressing: ABD Pad, 8x10 1 x Per Week Discharge Instructions: Apply over primary dressing as directed. Compression Wrap: ThreePress (3 layer compression wrap) 1 x Per Week Discharge Instructions: Apply three layer compression as directed. Wound #3 - Lower Leg Wound Laterality: Left, Posterior, Distal Cleanser: Soap and Water 1 x Per Week Discharge Instructions: May shower and wash wound with dial antibacterial soap and water prior to dressing change. Cleanser: Wound Cleanser 1 x Per Week Discharge Instructions: Cleanse the wound with wound cleanser prior to applying a clean dressing using gauze sponges, not tissue or cotton balls. Peri-Wound Care: Triamcinolone 15 (g) 1 x Per Week Discharge Instructions: Use triamcinolone 15 (g) as directed Peri-Wound Care: Sween Lotion (Moisturizing lotion) 1 x Per Week Discharge  Instructions: Apply moisturizing lotion as directed Prim Dressing: Hydrofera Blue Classic Foam, 4x4 in 1 x Per Week ary Discharge Instructions: Moisten with saline prior to applying to wound bed Secondary Dressing: Woven Gauze Sponge, Non-Sterile 4x4 in 1 x Per Week Discharge Instructions: Apply over primary dressing as directed. Secondary Dressing: ABD Pad, 8x10 1 x Per Week Discharge Instructions: Apply over primary dressing  as directed. Compression Wrap: ThreePress (3 layer compression wrap) 1 x Per Week Discharge Instructions: Apply three layer compression as directed. Wound #4 - Lower Leg Wound Laterality: Left, Lateral Cleanser: Soap and Water 1 x Per Week Discharge Instructions: May shower and wash wound with dial antibacterial soap and water prior to dressing change. Cleanser: Wound Cleanser 1 x Per Week Discharge Instructions: Cleanse the wound with wound cleanser prior to applying a clean dressing using gauze sponges, not tissue or cotton balls. Peri-Wound Care: Triamcinolone 15 (g) 1 x Per Week Discharge Instructions: Use triamcinolone 15 (g) as directed Peri-Wound Care: Sween Lotion (Moisturizing lotion) 1 x Per Week Discharge Instructions: Apply moisturizing lotion as directed Prim Dressing: Hydrofera Blue Classic Foam, 4x4 in 1 x Per Week ary Discharge Instructions: Moisten with saline prior to applying to wound bed Secondary Dressing: Woven Gauze Sponge, Non-Sterile 4x4 in 1 x Per Week Discharge Instructions: Apply over primary dressing as directed. Secondary Dressing: ABD Pad, 8x10 1 x Per Week Discharge Instructions: Apply over primary dressing as directed. Compression Wrap: ThreePress (3 layer compression wrap) 1 x Per Week Discharge Instructions: Apply three layer compression as directed. Electronic Signature(s) Signed: 10/04/2020 5:06:47 PM By: Linton Ham MD Signed: 10/04/2020 6:21:36 PM By: Deon Pilling Entered By: Deon Pilling on 10/04/2020  16:18:05 -------------------------------------------------------------------------------- Problem List Details Patient Name: Date of Service: Leah [name], Leah [name]. 10/04/2020 2:45 PM Medical Record Number: [id number] Patient Account Number: [id number] Date of Birth/Sex: Treating RN: Jul 26, 1929 (87 y.o. 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 10/04/2020 2:45 PM Medical Record Number: [id number] Patient Account Number: [id number] Date of Birth/Sex: Treating RN: 29-Jun-1929 (87 y.o. Leah [name] Primary Care Provider: Pricilla Holm Other Clinician: Referring Provider: Treating Provider/Extender: Charlie Pitter in Treatment: 0 Subjective Chief Complaint Information obtained from Patient 10/04/2020; patient is here for review of wounds on her bilateral lower legs right greater than left History of Present Illness (HPI) ADMISSION 10/04/2020; This is an 87 year old woman who lives independently. Her husband was actually a longstanding patient in this clinic who passed away  recently. She states her problem began in January with increased swelling in her legs she has a large area on the right anterior lower leg and 3 smaller areas on the left. She has a history of chronic venous insufficiency. I note that she had DVT studies on 03/29/2020 that were negative for DVT She was in her primary doctor's office late . in April and she was given Silvadene cream but she does not feel this has been helping. Recently she has been using Vaseline she has compression stockings but does not wear them reliably including juxta lites which belonged to her husband Past medical history includes chronic venous insufficiency, stage III chronic kidney disease, sciatica, peripheral edema, pelvic fractures ABIs in our clinic were normal 1.13 on the right  and 1.06 on the left Patient History Information obtained from Patient. Allergies Sulfa (Sulfonamide Antibiotics) (Severity: Moderate, Reaction: rash), sulfasalazine (Severity: Moderate, Reaction: hives, rash), nabumetone (Reaction: "feels weird"), naproxen (Reaction: hives), tramadol (Reaction: nausea/vomiting) Family History Unknown History. Social History Never smoker, Marital Status - Widowed, Alcohol Use - Never, Drug Use - No History, Caffeine Use - Rarely. Medical History Cardiovascular Patient has history of Peripheral Venous Disease Musculoskeletal Patient has history of Osteoarthritis Medical A Surgical History Notes nd Cardiovascular Hyperlipidemia Genitourinary CKD- stg 3 Review of Systems (ROS) Constitutional Symptoms (General Health) Denies complaints or symptoms of Fatigue, Fever, Chills, Marked Weight Change. Eyes Complains or has symptoms of Glasses / Contacts - glasses. Ear/Nose/Mouth/Throat Denies complaints or symptoms of Chronic sinus problems or rhinitis. Respiratory Denies complaints or symptoms of Chronic or frequent coughs, Shortness of Breath. Gastrointestinal Denies complaints or symptoms of  Frequent diarrhea, Nausea, Vomiting. Integumentary (Skin) Complains or has symptoms of Wounds - wounds on bilateral lower legs. Neurologic Denies complaints or symptoms of Numbness/parasthesias. Psychiatric Denies complaints or symptoms of Claustrophobia, Suicidal. Objective Constitutional Patient is hypertensive.. Pulse regular and within target range for patient.Marland Kitchen Respirations regular, non-labored and within target range.. Temperature is normal and within the target range for the patient.Marland Kitchen Appears in no distress. Vitals Time Taken: 3:16 PM, Height: 60 in, Source: Stated, Weight: 131 lbs, Source: Stated, BMI: 25.6, Temperature: 98.9 F, Pulse: 78 bpm, Respiratory Rate: 18 breaths/min, Blood Pressure: 151/75 mmHg. Cardiovascular Pedal pulses are easily palpable. 2-3+ pitting edema bilaterally below the knee with skin changes suggestive of chronic hemosiderin deposition. General Notes: Wound exam; the patient has small superficial areas on the left posterior x2 and the left lateral x1 oo On the right a more extensive wound area over the anterior tibia. Surface of this area is very fibrinous but I feel there is too much swelling to do a debridement here I will see how this does into next week when there is less edema fluid. Integumentary (Hair, Skin) Wound #1 status is Open. Original cause of wound was Gradually Appeared. The date acquired was: 06/02/2020. The wound is located on the Right,Anterior Lower Leg. The wound measures 14cm length x 13cm width x 0.1cm depth; 142.942cm^2 area and 14.294cm^3 volume. There is Fat Layer (Subcutaneous Tissue) exposed. There is no tunneling or undermining noted. There is a large amount of serous drainage noted. There is medium (34-66%) pink, pale granulation within the wound bed. There is a medium (34-66%) amount of necrotic tissue within the wound bed including Adherent Slough. Wound #2 status is Open. Original cause of wound was Gradually Appeared. The date  acquired was: 06/02/2020. The wound is located on the Left,Proximal,Posterior Lower Leg. The wound measures 0.7cm length x 0.6cm width x 0.1cm depth; 0.33cm^2 area and 0.033cm^3 volume. There is Fat Layer (Subcutaneous Tissue) exposed. There is no tunneling or undermining noted. There is a medium amount of serosanguineous drainage noted. The wound margin is flat and intact. There is large (67-100%) pink granulation within the wound bed. There is no necrotic tissue within the wound bed. Wound #3 status is Open. Original cause of wound was Gradually Appeared. The date acquired was: 06/02/2020. The wound is located on the Left,Distal,Posterior Lower Leg. The wound measures 1cm length x 0.7cm width x 0.1cm depth; 0.55cm^2 area and 0.055cm^3 volume. There is Fat Layer (Subcutaneous Tissue) exposed. There is no tunneling or undermining noted. There is a medium amount of serous drainage noted. The wound margin is flat and intact. There is large (67-100%)  pink granulation within the wound bed. There is no necrotic tissue within the wound bed. Wound #4 status is Open. Original cause of wound was Gradually Appeared. The date acquired was: 06/02/2020. The wound is located on the Left,Lateral Lower Leg. The wound measures 0.3cm length x 0.3cm width x 0.1cm depth; 0.071cm^2 area and 0.007cm^3 volume. There is Fat Layer (Subcutaneous Tissue) exposed. There is no tunneling or undermining noted. There is a medium amount of serous drainage noted. The wound margin is flat and intact. There is large (67-100%) red granulation within the wound bed. There is no necrotic tissue within the wound bed. 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 There was a Three Layer Compression Therapy Procedure by Baruch Gouty, RN. Post procedure Diagnosis Wound #1: Same as Pre-Procedure Wound #2 Pre-procedure diagnosis of Wound #2 is a Venous Leg Ulcer located on the Left,Proximal,Posterior Lower Leg . There was a Three Layer Compression Therapy Procedure by Baruch Gouty, RN. Post procedure Diagnosis Wound #2: Same as Pre-Procedure Wound #3 Pre-procedure diagnosis of Wound #3 is a Venous Leg Ulcer located on the Left,Distal,Posterior Lower Leg . There was a Three Layer Compression Therapy Procedure by Baruch Gouty, RN. Post procedure Diagnosis Wound #3: Same as Pre-Procedure Wound #4 Pre-procedure diagnosis of Wound #4 is a Venous Leg Ulcer located on the Left,Lateral Lower Leg . There was a Three Layer Compression Therapy Procedure by Baruch Gouty, RN. Post procedure Diagnosis Wound #4: Same as Pre-Procedure Plan Follow-up Appointments: Return Appointment in 1 week. - Dr. Dellia Nims Bathing/ Shower/ Hygiene: May shower with protection but do not get wound dressing(s) wet. - use a cast protector. Edema Control - Lymphedema / SCD / Other: Elevate legs to the level of the heart or above for 30 minutes daily and/or when sitting, a frequency of: - throughout the day. Avoid standing for long periods of time. Exercise regularly Other Edema Control Orders/Instructions: - bring in juxtalite HD compression garment next week for left leg. WOUND #1: - Lower Leg Wound Laterality: Right, Anterior Cleanser: Soap and Water 1 x Per Week/ Discharge Instructions: May shower and wash wound with dial antibacterial soap and water prior to dressing change. Cleanser: Wound Cleanser 1 x Per Week/ Discharge Instructions: Cleanse the wound with wound cleanser prior to applying a clean dressing using gauze sponges, not tissue or cotton balls. Peri-Wound  Care: Triamcinolone 15 (g) 1 x Per Week/ Discharge Instructions: Use triamcinolone 15 (g) as directed Peri-Wound Care: Sween Lotion (Moisturizing lotion) 1 x Per Week/ Discharge Instructions: Apply moisturizing lotion as directed Prim Dressing: Hydrofera Blue Classic Foam, 4x4 in 1 x Per Week/ ary Discharge Instructions: Moisten with saline prior to applying to wound bed Secondary Dressing: Woven Gauze Sponge, Non-Sterile 4x4 in 1 x Per Week/ Discharge Instructions: Apply over primary dressing as directed. Secondary Dressing: ABD Pad, 8x10 1 x Per Week/ Discharge Instructions: Apply over primary dressing as directed. Com pression Wrap: ThreePress (3 layer compression wrap) 1 x Per Week/ Discharge Instructions: Apply three layer compression as directed. WOUND #2: - Lower Leg Wound Laterality: Left, Posterior, Proximal Cleanser: Soap and Water 1 x Per Week/ Discharge Instructions: May shower and wash wound with  dial antibacterial soap and water prior to dressing change. Cleanser: Wound Cleanser 1 x Per Week/ Discharge Instructions: Cleanse the wound with wound cleanser prior to applying a clean dressing using gauze sponges, not tissue or cotton balls. Peri-Wound Care: Triamcinolone 15 (g) 1 x Per Week/ Discharge Instructions: Use triamcinolone 15 (g) as directed Peri-Wound Care: Sween Lotion (Moisturizing lotion) 1 x Per Week/ Discharge Instructions: Apply moisturizing lotion as directed Prim Dressing: Hydrofera Blue Classic Foam, 4x4 in 1 x Per Week/ ary Discharge Instructions: Moisten with saline prior to applying to wound bed Secondary Dressing: Woven Gauze Sponge, Non-Sterile 4x4 in 1 x Per Week/ Discharge Instructions: Apply over primary dressing as directed. Secondary Dressing: ABD Pad, 8x10 1 x Per Week/ Discharge Instructions: Apply over primary dressing as directed. Com pression Wrap: ThreePress (3 layer compression wrap) 1 x Per Week/ Discharge Instructions: Apply three layer  compression as directed. WOUND #3: - Lower Leg Wound Laterality: Left, Posterior, Distal Cleanser: Soap and Water 1 x Per Week/ Discharge Instructions: May shower and wash wound with dial antibacterial soap and water prior to dressing change. Cleanser: Wound Cleanser 1 x Per Week/ Discharge Instructions: Cleanse the wound with wound cleanser prior to applying a clean dressing using gauze sponges, not tissue or cotton balls. Peri-Wound Care: Triamcinolone 15 (g) 1 x Per Week/ Discharge Instructions: Use triamcinolone 15 (g) as directed Peri-Wound Care: Sween Lotion (Moisturizing lotion) 1 x Per Week/ Discharge Instructions: Apply moisturizing lotion as directed Prim Dressing: Hydrofera Blue Classic Foam, 4x4 in 1 x Per Week/ ary Discharge Instructions: Moisten with saline prior to applying to wound bed Secondary Dressing: Woven Gauze Sponge, Non-Sterile 4x4 in 1 x Per Week/ Discharge Instructions: Apply over primary dressing as directed. Secondary Dressing: ABD Pad, 8x10 1 x Per Week/ Discharge Instructions: Apply over primary dressing as directed. Com pression Wrap: ThreePress (3 layer compression wrap) 1 x Per Week/ Discharge Instructions: Apply three layer compression as directed. WOUND #4: - Lower Leg Wound Laterality: Left, Lateral Cleanser: Soap and Water 1 x Per Week/ Discharge Instructions: May shower and wash wound with dial antibacterial soap and water prior to dressing change. Cleanser: Wound Cleanser 1 x Per Week/ Discharge Instructions: Cleanse the wound with wound cleanser prior to applying a clean dressing using gauze sponges, not tissue or cotton balls. Peri-Wound Care: Triamcinolone 15 (g) 1 x Per Week/ Discharge Instructions: Use triamcinolone 15 (g) as directed Peri-Wound Care: Sween Lotion (Moisturizing lotion) 1 x Per Week/ Discharge Instructions: Apply moisturizing lotion as directed Prim Dressing: Hydrofera Blue Classic Foam, 4x4 in 1 x Per Week/ ary Discharge  Instructions: Moisten with saline prior to applying to wound bed Secondary Dressing: Woven Gauze Sponge, Non-Sterile 4x4 in 1 x Per Week/ Discharge Instructions: Apply over primary dressing as directed. Secondary Dressing: ABD Pad, 8x10 1 x Per Week/ Discharge Instructions: Apply over primary dressing as directed. Com pression Wrap: ThreePress (3 layer compression wrap) 1 x Per Week/ Discharge Instructions: Apply three layer compression as directed. 1. I would like to control the edema primarily this week 2. Because of the fibrinous debris of the wounds anteriorly I used Hydrofera Blue as the primary dressing. She is not homebound she will keep these on a week 3. She may require mechanical debridement on the right leg however the the surface cleans up we might be able to get away with just the Hydrofera Blue 4. No evidence of infection 5. The patient asked me about seeing a "vein specialist". I told her that usually  what I do in patients who have not had a prior wound history is get their areas to close give them adequate compression usually compression stockings of some description and then if these do not help then we will do venous reflux studies and refer Electronic Signature(s) Signed: 10/04/2020 5:06:47 PM By: Linton Ham MD Entered By: Linton Ham on 10/04/2020 16:55:16 -------------------------------------------------------------------------------- HxROS Details Patient Name: Date of Service: Leah [name]. 10/04/2020 2:45 PM Medical Record Number: [id number] Patient Account Number: [id number] Date of Birth/Sex: Treating RN: 06-15-1929 (87 y.o. Leah [name] Primary Care Provider: Pricilla Holm Other Clinician: Referring Provider: Treating Provider/Extender: Charlie Pitter in Treatment: 0 Information Obtained From Patient Constitutional Symptoms (General Health) Complaints and Symptoms: Negative for: Fatigue; Fever; Chills;  Marked Weight Change Eyes Complaints and Symptoms: Positive for: Glasses / Contacts - glasses Ear/Nose/Mouth/Throat Complaints and Symptoms: Negative for: Chronic sinus problems or rhinitis Respiratory Complaints and Symptoms: Negative for: Chronic or frequent coughs; Shortness of Breath Gastrointestinal Complaints and Symptoms: Negative for: Frequent diarrhea; Nausea; Vomiting Integumentary (Skin) Complaints and Symptoms: Positive for: Wounds - wounds on bilateral lower legs Neurologic Complaints and Symptoms: Negative for: Numbness/parasthesias Psychiatric Complaints and Symptoms: Negative for: Claustrophobia; Suicidal Hematologic/Lymphatic Cardiovascular Medical History: Positive for: Peripheral Venous Disease Past Medical History Notes: Hyperlipidemia Endocrine Genitourinary Medical History: Past Medical History Notes: CKD- stg 3 Immunological Musculoskeletal Medical History: Positive for: Osteoarthritis Oncologic Immunizations Pneumococcal Vaccine: Received Pneumococcal Vaccination: Yes Implantable Devices None Family and Social History Unknown History: Yes; Never smoker; Marital Status - Widowed; Alcohol Use: Never; Drug Use: No History; Caffeine Use: Rarely; Financial Concerns: No; Food, Clothing or Shelter Needs: No; Support System Lacking: No; Transportation Concerns: No Engineer, maintenance) Signed: 10/04/2020 5:06:47 PM By: Linton Ham MD Signed: 10/04/2020 5:41:41 PM By: Levan Hurst RN, BSN Entered By: Levan Hurst on 10/04/2020 15:45:47 -------------------------------------------------------------------------------- [place] Details Patient Name: Date of Service: Leah [name], Leah [name]. 10/04/2020 Medical Record Number: [id number] Patient Account Number: [id number] Date of Birth/Sex: Treating RN: [birth year] (87 y.o. 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

## 2020-10-11 ENCOUNTER — Encounter (HOSPITAL_BASED_OUTPATIENT_CLINIC_OR_DEPARTMENT_OTHER): Payer: Medicare PPO | Admitting: Internal Medicine

## 2020-10-11 ENCOUNTER — Other Ambulatory Visit: Payer: Self-pay

## 2020-10-11 DIAGNOSIS — Z1624 Resistance to multiple antibiotics: Secondary | ICD-10-CM | POA: Diagnosis not present

## 2020-10-11 DIAGNOSIS — L97812 Non-pressure chronic ulcer of other part of right lower leg with fat layer exposed: Secondary | ICD-10-CM | POA: Diagnosis not present

## 2020-10-11 DIAGNOSIS — B9689 Other specified bacterial agents as the cause of diseases classified elsewhere: Secondary | ICD-10-CM | POA: Diagnosis not present

## 2020-10-11 DIAGNOSIS — L97822 Non-pressure chronic ulcer of other part of left lower leg with fat layer exposed: Secondary | ICD-10-CM | POA: Diagnosis not present

## 2020-10-11 DIAGNOSIS — Z1611 Resistance to penicillins: Secondary | ICD-10-CM | POA: Diagnosis not present

## 2020-10-11 DIAGNOSIS — L97818 Non-pressure chronic ulcer of other part of right lower leg with other specified severity: Secondary | ICD-10-CM | POA: Diagnosis not present

## 2020-10-11 DIAGNOSIS — B9562 Methicillin resistant Staphylococcus aureus infection as the cause of diseases classified elsewhere: Secondary | ICD-10-CM | POA: Diagnosis not present

## 2020-10-11 DIAGNOSIS — B965 Pseudomonas (aeruginosa) (mallei) (pseudomallei) as the cause of diseases classified elsewhere: Secondary | ICD-10-CM | POA: Diagnosis not present

## 2020-10-11 DIAGNOSIS — I872 Venous insufficiency (chronic) (peripheral): Secondary | ICD-10-CM | POA: Diagnosis not present

## 2020-10-11 DIAGNOSIS — I87333 Chronic venous hypertension (idiopathic) with ulcer and inflammation of bilateral lower extremity: Secondary | ICD-10-CM | POA: Diagnosis not present

## 2020-10-15 NOTE — Progress Notes (Signed)
TRINE, STAPLEFORD T ([id number]) . Visit Report for 10/11/2020 Debridement Details Patient Name: Date of Service: Leah Cater NNE T. 10/11/2020 12:45 PM Medical Record Number: [id number] Patient Account Number: [id number] Date of Birth/Sex: Treating RN: 07-12-1929 (87 y.o. Tonita Phoenix, Lauren Primary Care Provider: Pricilla Holm Other Clinician: Referring Provider: Treating Provider/Extender: Charlie Pitter in Treatment: 1 Debridement Performed for Assessment: Wound #1 Right,Anterior Lower Leg Performed By: Physician Ricard Dillon., MD Debridement Type: Debridement Severity of Tissue Pre Debridement: Fat layer exposed Level of Consciousness (Pre-procedure): Awake and Alert Pre-procedure Verification/Time Out Yes - 13:24 Taken: Start Time: 13:25 Pain Control: Other : Benzocaine T Area Debrided (L x W): otal 1 (cm) x 1 (cm) = 1 (cm) Tissue and other material debrided: Non-Viable, Subcutaneous Level: Skin/Subcutaneous Tissue Debridement Description: Excisional Instrument: Curette Specimen: Tissue Culture Number of Specimens T aken: 1 Bleeding: Minimum Hemostasis Achieved: Pressure End Time: 13:34 Response to Treatment: Procedure was tolerated well Level of Consciousness (Post- Awake and Alert procedure): Post Debridement Measurements of Total Wound Length: (cm) 14 Width: (cm) 13.5 Depth: (cm) 0.1 Volume: (cm) 14.844 Character of Wound/Ulcer Post Debridement: Stable Severity of Tissue Post Debridement: Fat layer exposed Post Procedure Diagnosis Same as Pre-procedure Electronic Signature(s) Signed: 10/11/2020 3:44:17 PM By: Linton Ham MD Signed: 10/15/2020 6:08:57 PM By: Rhae Hammock RN Entered By: Linton Ham on 10/11/2020 13:53:53 -------------------------------------------------------------------------------- HPI Details Patient Name: Date of Service: Leah [name], Leah Saunas NNE T. 10/11/2020 12:45 PM Medical Record Number:  [id number] Patient Account Number: [id number] Date of Birth/Sex: Treating RN: 27-Sep-1929 (87 y.o. Tonita Phoenix, Lauren Primary Care Provider: Pricilla Holm Other Clinician: Referring Provider: Treating Provider/Extender: Charlie Pitter in Treatment: 1 History of Present Illness HPI Description: ADMISSION 10/04/2020; This is an 87 year old woman who lives independently. Her husband was actually a longstanding patient in this clinic who passed away recently. She states her problem began in January with increased swelling in her legs she has a large area on the right anterior lower leg and 3 smaller areas on the left. She has a history of chronic venous insufficiency. I note that she had DVT studies on 03/29/2020 that were negative for DVT She was in her primary doctor's office late . in April and she was given Silvadene cream but she does not feel this has been helping. Recently she has been using Vaseline she has compression stockings but does not wear them reliably including juxta lites which belonged to her husband Past medical history includes chronic venous insufficiency, stage III chronic kidney disease, sciatica, peripheral edema, pelvic fractures ABIs in our clinic were normal 1.13 on the right and 1.06 on the left 5/12; arrives in clinic today with only 1 wound left on the left leg which is the posterior left calf. Still an extensive area on the right anterior lower leg. Our intake nurse reports malodorous purulent looking drainage. We have been using silver alginate under compression 3 layer Electronic Signature(s) Signed: 10/11/2020 3:44:17 PM By: Linton Ham MD Entered By: Linton Ham on 10/11/2020 13:49:58 -------------------------------------------------------------------------------- Physical Exam Details Patient Name: Date of Service: Leah Cater NNE T. 10/11/2020 12:45 PM Medical Record Number: [id number] Patient Account Number:  [id number] Date of Birth/Sex: Treating RN: March 08, 1930 (87 y.o. Tonita Phoenix, Lauren Primary Care Provider: Pricilla Holm Other Clinician: Referring Provider: Treating Provider/Extender: Charlie Pitter in Treatment: 1 Constitutional Patient is hypertensive.. Pulse regular and within target range for patient.Marland Kitchen Respirations regular, non-labored and within target range.. Temperature is normal and  within the target range for the patient.Marland Kitchen Appears in no distress. Cardiovascular Pedal pulses present. Notes Wound exam; the superficial areas on the left x2 of healed there is still 1 remaining on the left posterior calf area of The area on the right is more extensive. Erythema present some tenderness. This could be cellulitis. I used a #5 curette to remove a 1 x 1 cm deep tissue specimen for PCR culture. There is not much in the way of lower extremity edema Electronic Signature(s) Signed: 10/11/2020 3:44:17 PM By: Linton Ham MD Entered By: Linton Ham on 10/11/2020 13:51:50 -------------------------------------------------------------------------------- Physician Orders Details Patient Name: Date of Service: Leah Cater NNE T. 10/11/2020 12:45 PM Medical Record Number: [id number] Patient Account Number: [id number] Date of Birth/Sex: Treating RN: [birth year] (87 y.o. Leah [name] Primary Care Provider: Pricilla Holm Other Clinician: Referring Provider: Treating Provider/Extender: Charlie Pitter in Treatment: 1 Verbal / Phone Orders: No Diagnosis Coding Follow-up Appointments ppointment in 1 week. - Dr. Dellia Nims Return A Other: - Begin taking Keflex, MD will notify you of culture results Bathing/ Shower/ Hygiene May shower with protection but do not get wound dressing(s) wet. - use a cast protector. Edema Control - Lymphedema / SCD / Other Elevate legs to the level of the heart or above for 30 minutes daily  and/or when sitting, a frequency of: - throughout the day. Avoid standing for long periods of time. Exercise regularly Other Edema Control Orders/Instructions: - bring in juxtalite HD compression garment next week for left leg. Additional Orders / Instructions Follow Nutritious Diet Wound Treatment Wound #1 - Lower Leg Wound Laterality: Right, Anterior Cleanser: Soap and Water 1 x Per Week Discharge Instructions: May shower and wash wound with dial antibacterial soap and water prior to dressing change. Cleanser: Wound Cleanser 1 x Per Week Discharge Instructions: Cleanse the wound with wound cleanser prior to applying a clean dressing using gauze sponges, not tissue or cotton balls. Peri-Wound Care: Triamcinolone 15 (g) 1 x Per Week Discharge Instructions: Use triamcinolone 15 (g) as directed Peri-Wound Care: Sween Lotion (Moisturizing lotion) 1 x Per Week Discharge Instructions: Apply moisturizing lotion as directed Prim Dressing: KerraCel Ag Gelling Fiber Dressing, 4x5 in (silver alginate) 1 x Per Week ary Discharge Instructions: Apply silver alginate to wound bed as instructed Secondary Dressing: Woven Gauze Sponge, Non-Sterile 4x4 in 1 x Per Week Discharge Instructions: Apply over primary dressing as directed. Secondary Dressing: ABD Pad, 8x10 1 x Per Week Discharge Instructions: Apply over primary dressing as directed. Compression Wrap: ThreePress (3 layer compression wrap) 1 x Per Week Discharge Instructions: Apply three layer compression as directed. Wound #2 - Lower Leg Wound Laterality: Left, Posterior, Proximal Cleanser: Soap and Water 1 x Per Week Discharge Instructions: May shower and wash wound with dial antibacterial soap and water prior to dressing change. Cleanser: Wound Cleanser 1 x Per Week Discharge Instructions: Cleanse the wound with wound cleanser prior to applying a clean dressing using gauze sponges, not tissue or cotton balls. Peri-Wound Care: Triamcinolone 15  (g) 1 x Per Week Discharge Instructions: Use triamcinolone 15 (g) as directed Peri-Wound Care: Sween Lotion (Moisturizing lotion) 1 x Per Week Discharge Instructions: Apply moisturizing lotion as directed Prim Dressing: KerraCel Ag Gelling Fiber Dressing, 4x5 in (silver alginate) 1 x Per Week ary Discharge Instructions: Apply silver alginate to wound bed as instructed Secondary Dressing: Woven Gauze Sponge, Non-Sterile 4x4 in 1 x Per Week Discharge Instructions: Apply over primary dressing as directed. Secondary Dressing: ABD Pad,  8x10 1 x Per Week Discharge Instructions: Apply over primary dressing as directed. Compression Wrap: ThreePress (3 layer compression wrap) 1 x Per Week Discharge Instructions: Apply three layer compression as directed. Wound #3 - Lower Leg Wound Laterality: Left, Posterior, Distal Cleanser: Soap and Water 1 x Per Week Discharge Instructions: May shower and wash wound with dial antibacterial soap and water prior to dressing change. Cleanser: Wound Cleanser 1 x Per Week Discharge Instructions: Cleanse the wound with wound cleanser prior to applying a clean dressing using gauze sponges, not tissue or cotton balls. Peri-Wound Care: Triamcinolone 15 (g) 1 x Per Week Discharge Instructions: Use triamcinolone 15 (g) as directed Peri-Wound Care: Sween Lotion (Moisturizing lotion) 1 x Per Week Discharge Instructions: Apply moisturizing lotion as directed Prim Dressing: KerraCel Ag Gelling Fiber Dressing, 4x5 in (silver alginate) 1 x Per Week ary Discharge Instructions: Apply silver alginate to wound bed as instructed Secondary Dressing: Woven Gauze Sponge, Non-Sterile 4x4 in 1 x Per Week Discharge Instructions: Apply over primary dressing as directed. Secondary Dressing: ABD Pad, 8x10 1 x Per Week Discharge Instructions: Apply over primary dressing as directed. Compression Wrap: ThreePress (3 layer compression wrap) 1 x Per Week Discharge Instructions: Apply three  layer compression as directed. Laboratory erobe culture (MICRO) Bacteria identified in Unspecified specimen by A LOINC Code: [id number] Convenience Name: Areobic culture-specimen not specified Electronic Signature(s) Signed: 10/11/2020 3:44:17 PM By: Linton Ham MD Signed: 10/11/2020 4:36:36 PM By: Lorrin Jackson Entered By: Lorrin Jackson on 10/11/2020 13:37:15 -------------------------------------------------------------------------------- Problem List Details Patient Name: Date of Service: Leah Cater NNE T. 10/11/2020 12:45 PM Medical Record Number: [id number] Patient Account Number: [id number] Date of Birth/Sex: Treating RN: 01-08-1930 (87 y.o. 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 10/11/2020 12:45 PM Medical Record Number: [id number] Patient Account Number: [id number] Date of Birth/Sex: Treating RN: 10-07-1929 (87 y.o. Tonita Phoenix, Lauren Primary Care Provider: Pricilla Holm Other Clinician: Referring Provider: Treating  Provider/Extender: Charlie Pitter in Treatment: 1 Subjective History of Present Illness (HPI) ADMISSION 10/04/2020; This is an 87 year old woman who lives independently. Her husband was actually a longstanding patient in this clinic who passed away recently. She states her problem began in January with increased swelling in her legs she has a large area on the right anterior lower leg and 3 smaller areas on the left. She has a history of chronic venous insufficiency. I note that she had DVT studies on 03/29/2020 that were negative for DVT She was in her primary doctor's office late . in April and she was given Silvadene cream but she does not feel this has been helping. Recently she has been using Vaseline she has compression stockings but does not wear them reliably including juxta lites which belonged to her husband Past medical history includes chronic venous insufficiency,  stage III chronic kidney disease, sciatica, peripheral edema, pelvic fractures ABIs in our clinic were normal 1.13 on the right and 1.06 on the left 5/12; arrives in clinic today with only 1 wound left on the left leg which is the posterior left calf. Still an extensive area on the right anterior lower leg. Our intake nurse reports malodorous purulent looking drainage. We have been using silver alginate under compression 3 layer Objective Constitutional Patient is hypertensive.. Pulse regular and within target range for patient.Marland Kitchen Respirations regular, non-labored and within target range.. Temperature is normal and within the target range for the patient.Marland Kitchen Appears in no distress. Vitals Time Taken: 1:02 PM, Height: 60 in, Source: Stated, Weight: 131 lbs, Source: Stated, BMI: 25.6, Temperature: 98.2 F, Pulse: 74 bpm, Respiratory Rate: 18 breaths/min, Blood Pressure: 180/90 mmHg. Cardiovascular Pedal pulses present. General Notes: Wound exam; the superficial areas on the left x2 of healed there  is still 1 remaining on the left posterior calf area of oo The area on the right is more extensive. Erythema present some tenderness. This could be cellulitis. I used a #5 curette to remove a 1 x 1 cm deep tissue specimen for PCR culture. There is not much in the way of lower extremity edema Integumentary (Hair, Skin) Wound #1 status is Open. Original cause of wound was Gradually Appeared. The date acquired was: 06/02/2020. The wound has been in treatment 1 weeks. The wound is located on the Right,Anterior Lower Leg. The wound measures 14cm length x 13.5cm width x 0.1cm depth; 148.44cm^2 area and 14.844cm^3 volume. There is Fat Layer (Subcutaneous Tissue) exposed. There is no tunneling or undermining noted. There is a large amount of purulent drainage noted. Foul odor after cleansing was noted. The wound margin is indistinct and nonvisible. There is medium (34-66%) pink, pale granulation within the wound bed. There is a medium (34-66%) amount of necrotic tissue within the wound bed including Adherent Slough. Wound #2 status is Open. Original cause of wound was Gradually Appeared. The date acquired was: 06/02/2020. The wound has been in treatment 1 weeks. The wound is located on the Left,Proximal,Posterior Lower Leg. The wound measures 0.1cm length x 0.1cm width x 0.1cm depth; 0.008cm^2 area and 0.001cm^3 volume. The wound is limited to skin breakdown. There is no tunneling or undermining noted. There is a small amount of serous drainage noted. The wound margin is flat and intact. There is small (1-33%) pink granulation within the wound bed. There is no necrotic tissue within the wound bed. Wound #3 status is Open. Original cause of wound was Gradually Appeared. The date acquired was: 06/02/2020. The wound has been in treatment 1 weeks. The wound is located on the Left,Distal,Posterior Lower Leg. The wound measures 0.8cm length x 1cm width x 0.1cm depth; 0.628cm^2 area and 0.063cm^3 volume. There is Fat  Layer (Subcutaneous Tissue) exposed. There is no undermining noted. There is a small amount of serous drainage noted. The wound margin is flat and intact. There is large (67-100%) pink granulation within the wound bed. There is no necrotic tissue within the wound bed. Wound #4 status is Healed - Epithelialized. Original cause of wound was Gradually Appeared. The date acquired was: 06/02/2020. The wound has been in treatment 1 weeks. The wound is located on the Left,Lateral Lower Leg. The wound measures 0cm length x 0cm width x 0cm depth; 0cm^2 area and 0cm^3 volume. There is no tunneling or undermining noted. There is a none present amount of drainage noted. There is no granulation  within the wound bed. There is no necrotic tissue within the wound bed. 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 There was a Excisional Skin/Subcutaneous Tissue Debridement with a total area of 7 sq cm performed by Ricard Dillon., MD. With the following instrument(s): Curette to remove Non-Viable tissue/material. Material removed includes Subcutaneous Tissue after achieving pain control using Other (Benzocaine). 1 specimen was taken by a Tissue Culture and sent to the lab per facility protocol. A time out was conducted at 13:24, prior to the start of the procedure. A Minimum amount of bleeding was controlled with Pressure. The procedure was tolerated well. Post Debridement Measurements: 14cm length x 13.5cm width x 0.1cm depth; 14.844cm^3  volume. Character of Wound/Ulcer Post Debridement is stable. Severity of Tissue Post Debridement is: Fat layer exposed. Post procedure Diagnosis Wound #1: Same as Pre-Procedure Pre-procedure diagnosis of Wound #1 is a Venous Leg Ulcer located on the Right,Anterior Lower Leg . There was a Three Layer Compression Therapy Procedure by Lorrin Jackson, RN. Post procedure Diagnosis Wound #1: Same as Pre-Procedure Wound #2 Pre-procedure diagnosis of Wound #2 is a Venous Leg Ulcer located on the Left,Proximal,Posterior Lower Leg . There was a Three Layer Compression Therapy Procedure by Lorrin Jackson, RN. Post procedure Diagnosis Wound #2: Same as Pre-Procedure Wound #3 Pre-procedure diagnosis of Wound #3 is a Venous Leg Ulcer located on the Left,Distal,Posterior Lower Leg . There was a Three Layer Compression Therapy Procedure by Lorrin Jackson, RN. Post procedure Diagnosis Wound #3: Same as Pre-Procedure Plan Follow-up Appointments: Return Appointment in 1 week. - Dr. Dellia Nims Other: - Begin taking Keflex, MD will notify you of culture results Bathing/ Shower/ Hygiene: May shower with protection but do not get wound dressing(s) wet. - use a cast protector. Edema Control - Lymphedema / SCD / Other: Elevate legs to the level of the heart or above for 30 minutes daily and/or when sitting, a frequency of: - throughout the day. Avoid standing for long periods of time. Exercise regularly Other Edema Control Orders/Instructions: - bring in juxtalite HD compression garment next week for left leg. Additional Orders / Instructions: Follow Nutritious Diet Laboratory ordered were: Areobic culture-specimen not specified WOUND #1: - Lower Leg Wound Laterality: Right, Anterior Cleanser: Soap and Water 1 x Per Week/ Discharge Instructions: May shower and wash wound with dial antibacterial soap and water prior to dressing change. Cleanser: Wound Cleanser 1 x Per Week/ Discharge Instructions: Cleanse the  wound with wound cleanser prior to applying a clean dressing using gauze sponges, not tissue or cotton balls. Peri-Wound Care: Triamcinolone 15 (g) 1 x Per Week/ Discharge Instructions: Use triamcinolone 15 (g) as directed Peri-Wound Care: Sween Lotion (Moisturizing lotion) 1 x Per Week/ Discharge Instructions: Apply moisturizing lotion as directed Prim Dressing: KerraCel Ag Gelling Fiber Dressing, 4x5 in (silver alginate) 1 x Per Week/ ary Discharge Instructions: Apply silver alginate to wound bed as instructed Secondary Dressing: Woven Gauze Sponge, Non-Sterile 4x4 in 1 x Per Week/ Discharge Instructions: Apply over primary dressing as directed. Secondary Dressing: ABD Pad, 8x10 1 x Per Week/ Discharge Instructions: Apply over primary  dressing as directed. Com pression Wrap: ThreePress (3 layer compression wrap) 1 x Per Week/ Discharge Instructions: Apply three layer compression as directed. WOUND #2: - Lower Leg Wound Laterality: Left, Posterior, Proximal Cleanser: Soap and Water 1 x Per Week/ Discharge Instructions: May shower and wash wound with dial antibacterial soap and water prior to dressing change. Cleanser: Wound Cleanser 1 x Per Week/ Discharge Instructions: Cleanse the wound with wound cleanser prior to applying a clean dressing using gauze sponges, not tissue or cotton balls. Peri-Wound Care: Triamcinolone 15 (g) 1 x Per Week/ Discharge Instructions: Use triamcinolone 15 (g) as directed Peri-Wound Care: Sween Lotion (Moisturizing lotion) 1 x Per Week/ Discharge Instructions: Apply moisturizing lotion as directed Prim Dressing: KerraCel Ag Gelling Fiber Dressing, 4x5 in (silver alginate) 1 x Per Week/ ary Discharge Instructions: Apply silver alginate to wound bed as instructed Secondary Dressing: Woven Gauze Sponge, Non-Sterile 4x4 in 1 x Per Week/ Discharge Instructions: Apply over primary dressing as directed. Secondary Dressing: ABD Pad, 8x10 1 x Per Week/ Discharge  Instructions: Apply over primary dressing as directed. Com pression Wrap: ThreePress (3 layer compression wrap) 1 x Per Week/ Discharge Instructions: Apply three layer compression as directed. WOUND #3: - Lower Leg Wound Laterality: Left, Posterior, Distal Cleanser: Soap and Water 1 x Per Week/ Discharge Instructions: May shower and wash wound with dial antibacterial soap and water prior to dressing change. Cleanser: Wound Cleanser 1 x Per Week/ Discharge Instructions: Cleanse the wound with wound cleanser prior to applying a clean dressing using gauze sponges, not tissue or cotton balls. Peri-Wound Care: Triamcinolone 15 (g) 1 x Per Week/ Discharge Instructions: Use triamcinolone 15 (g) as directed Peri-Wound Care: Sween Lotion (Moisturizing lotion) 1 x Per Week/ Discharge Instructions: Apply moisturizing lotion as directed Prim Dressing: KerraCel Ag Gelling Fiber Dressing, 4x5 in (silver alginate) 1 x Per Week/ ary Discharge Instructions: Apply silver alginate to wound bed as instructed Secondary Dressing: Woven Gauze Sponge, Non-Sterile 4x4 in 1 x Per Week/ Discharge Instructions: Apply over primary dressing as directed. Secondary Dressing: ABD Pad, 8x10 1 x Per Week/ Discharge Instructions: Apply over primary dressing as directed. Com pression Wrap: ThreePress (3 layer compression wrap) 1 x Per Week/ Discharge Instructions: Apply three layer compression as directed. 1. Still using silver alginate under compression 2. We will await PCR culture which should be back by late tomorrow in the meantime I gave her empiric cephalexin 500 every 6 for 7 days 3. I felt we still had to put this under compression VS we have a risk of progression of the wounds from uncontrolled swelling Electronic Signature(s) Signed: 10/11/2020 3:44:17 PM By: Linton Ham MD Entered By: Linton Ham on 10/11/2020  13:53:03 -------------------------------------------------------------------------------- SuperBill Details Patient Name: Date of Service: Leah [name], Leah Saunas NNE T. 10/11/2020 Medical Record Number: [id number] Patient Account Number: [id number] Date of Birth/Sex: Treating RN: Jan 23, 1930 (87 y.o. 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

## 2020-10-15 NOTE — Progress Notes (Signed)
Leah [name], Leah [name] ([id number]) . Visit Report for 10/11/2020 Arrival Information Details Patient Name: Date of Service: Leah Cater NNE [name]. 10/11/2020 12:45 PM Medical Record Number: [id number] Patient Account Number: [id number] Date of Birth/Sex: Treating RN: April 30, 1930 (87 y.o. Martyn Malay, Linda Primary Care Saryna Kneeland: Pricilla Holm Other Clinician: Referring Roger Kettles: Treating Satcha Storlie/Extender: Charlie Pitter in Treatment: 1 Visit Information History Since Last Visit Added or deleted any medications: No Patient Arrived: Leah [name] Any new allergies or adverse reactions: No Arrival Time: 12:56 Had a fall or experienced change in No Accompanied By: self activities of daily living that may affect Transfer Assistance: None risk of falls: Patient Identification Verified: Yes Signs or symptoms of abuse/neglect since last visito No Secondary Verification Process Completed: Yes Hospitalized since last visit: No Patient Requires Transmission-Based Precautions: No Implantable device outside of the clinic excluding No Patient Has Alerts: No cellular tissue based products placed in the center since last visit: Has Dressing in Place as Prescribed: Yes Has Compression in Place as Prescribed: Yes Pain Present Now: No Electronic Signature(s) Signed: 10/15/2020 4:50:33 PM By: Leah Gouty RN, BSN Entered By: Leah Gouty on 10/11/2020 13:02:04 -------------------------------------------------------------------------------- Compression Therapy Details Patient Name: Date of Service: Leah Cater NNE [name]. 10/11/2020 12:45 PM Medical Record Number: [id number] Patient Account Number: [id number] Date of Birth/Sex: Treating RN: 04-Feb-1930 (87 y.o. Sue Lush Primary Care Catherine Cubero: Pricilla Holm Other Clinician: Referring Lezette Kitts: Treating Manville Rico/Extender: Charlie Pitter in Treatment: 1 Compression Therapy Performed  for Wound Assessment: Wound #1 Right,Anterior Lower Leg Performed By: Clinician Lorrin Jackson, RN Compression Type: Three Layer Post Procedure Diagnosis Same as Pre-procedure Electronic Signature(s) Signed: 10/11/2020 4:36:36 PM By: Lorrin Jackson Entered By: Lorrin Jackson on 10/11/2020 13:34:33 -------------------------------------------------------------------------------- Compression Therapy Details Patient Name: Date of Service: Leah Cater NNE [name]. 10/11/2020 12:45 PM Medical Record Number: [id number] Patient Account Number: [id number] Date of Birth/Sex: Treating RN: 31-Oct-1929 (87 y.o. Sue Lush Primary Care Gwendy Boeder: Pricilla Holm Other Clinician: Referring Lavonya Hoerner: Treating Muhammadali Ries/Extender: Charlie Pitter in Treatment: 1 Compression Therapy Performed for Wound Assessment: Wound #2 Left,Proximal,Posterior Lower Leg Performed By: Clinician Lorrin Jackson, RN Compression Type: Three Layer Post Procedure Diagnosis Same as Pre-procedure Electronic Signature(s) Signed: 10/11/2020 4:36:36 PM By: Lorrin Jackson Entered By: Lorrin Jackson on 10/11/2020 13:34:33 -------------------------------------------------------------------------------- Compression Therapy Details Patient Name: Date of Service: Leah Cater NNE [name]. 10/11/2020 12:45 PM Medical Record Number: [id number] Patient Account Number: [id number] Date of Birth/Sex: Treating RN: 1929/06/20 (87 y.o. Sue Lush Primary Care Chaz Mcglasson: Pricilla Holm Other Clinician: Referring Maebell Lyvers: Treating Gurkaran Rahm/Extender: Charlie Pitter in Treatment: 1 Compression Therapy Performed for Wound Assessment: Wound #3 Left,Distal,Posterior Lower Leg Performed By: Clinician Lorrin Jackson, RN Compression Type: Three Layer Post Procedure Diagnosis Same as Pre-procedure Electronic Signature(s) Signed: 10/11/2020 4:36:36 PM By: Lorrin Jackson Entered By: Lorrin Jackson on 10/11/2020 13:34:34 -------------------------------------------------------------------------------- Encounter Discharge Information Details Patient Name: Date of Service: Leah [name], Leah Saunas NNE [name]. 10/11/2020 12:45 PM Medical Record Number: [id number] Patient Account Number: [id number] Date of Birth/Sex: Treating RN: [birth year] (87 y.o. Elam Dutch Primary Care Reha Martinovich: Pricilla Holm Other Clinician: Referring Jamire Shabazz: Treating Abraham Margulies/Extender: Charlie Pitter in Treatment: 1 Encounter Discharge Information Items Post Procedure Vitals Discharge Condition: Stable Temperature (F): 98.2 Ambulatory Status: Cane Pulse (bpm): 74 Discharge Destination: Home Respiratory Rate (breaths/min): 18 Transportation: Private Auto Blood Pressure (mmHg): 180/90 Accompanied By: self Schedule Follow-up Appointment: Yes Clinical Summary of Care: Patient Declined Electronic Signature(s) Signed:  10/15/2020 4:50:33 PM By: Leah Gouty RN, BSN Entered By: Leah Gouty on 10/11/2020 14:07:18 -------------------------------------------------------------------------------- Lower Extremity Assessment Details Patient Name: Date of Service: Leah Cater NNE [name]. 10/11/2020 12:45 PM Medical Record Number: [id number] Patient Account Number: [id number] Date of Birth/Sex: Treating RN: 1930-01-02 (87 y.o. Elam Dutch Primary Care Mattisen Pohlmann: Pricilla Holm Other Clinician: Referring Kimiko Common: Treating Bashir Marchetti/Extender: Charlie Pitter in Treatment: 1 Edema Assessment Assessed: [Left: No] [Right: No] Edema: [Left: Yes] [Right: Yes] Calf Left: Right: Point of Measurement: 29 cm From Medial Instep 31.5 cm 38.5 cm Ankle Left: Right: Point of Measurement: 10 cm From Medial Instep 21 cm 21.7 cm Vascular Assessment Pulses: Dorsalis Pedis Palpable: [Left:No] [Right:Yes] Electronic  Signature(s) Signed: 10/15/2020 4:50:33 PM By: Leah Gouty RN, BSN Entered By: Leah Gouty on 10/11/2020 13:07:34 -------------------------------------------------------------------------------- Multi Wound Chart Details Patient Name: Date of Service: Leah [name], Leah Saunas NNE [name]. 10/11/2020 12:45 PM Medical Record Number: [id number] Patient Account Number: [id number] Date of Birth/Sex: Treating RN: Oct 17, 1929 (87 y.o. Tonita Phoenix, Lauren Primary Care Javaya [place]: Pricilla Holm Other Clinician: Referring Wilmon Conover: Treating Rowyn Mustapha/Extender: Charlie Pitter in Treatment: 1 Vital Signs Height(in): 60 Pulse(bpm): 42 Weight(lbs): 131 Blood Pressure(mmHg): 180/90 Body Mass Index(BMI): 26 Temperature(F): 98.2 Respiratory Rate(breaths/min): 18 Photos: [1:No Photos Right, Anterior Lower Leg] [2:No Photos Left, Proximal, Posterior Lower Leg] [3:No Photos Left, Distal, Posterior Lower Leg] Wound Location: [1:Gradually Appeared] [2:Gradually Appeared] [3:Gradually Appeared] Wounding Event: [1:Venous Leg Ulcer] [2:Venous Leg Ulcer] [3:Venous Leg Ulcer] Primary Etiology: [1:Peripheral Venous Disease,] [2:Peripheral Venous Disease,] [3:Peripheral Venous Disease,] Comorbid History: [1:Osteoarthritis 06/02/2020] [2:Osteoarthritis 06/02/2020] [3:Osteoarthritis 06/02/2020] Date Acquired: [1:1] [2:1] [3:1] Weeks of Treatment: [1:Open] [2:Open] [3:Open] Wound Status: [1:14x13.5x0.1] [2:0.1x0.1x0.1] [3:0.8x1x0.1] Measurements L x W x D (cm) [1:148.44] [2:0.008] [3:0.628] A (cm) : rea [1:14.844] [2:0.001] [3:0.063] Volume (cm) : [1:-3.80%] [2:97.60%] [3:-14.20%] % Reduction in Area: [1:-3.80%] [2:97.00%] [3:-14.50%] % Reduction in Volume: [1:Full Thickness Without Exposed] [2:Full Thickness Without Exposed] [3:Full Thickness Without Exposed] Classification: [1:Support Structures Large] [2:Support Structures Small] [3:Support Structures Small] Exudate Amount: [1:Purulent]  [2:Serous] [3:Serous] Exudate Type: [1:yellow, brown, green] [2:amber] [3:amber] Exudate Color: [1:Yes] [2:No] [3:No] Foul Odor A Cleansing: [1:fter No] [2:N/A] [3:N/A] Odor Anticipated Due to Product Use: [1:Indistinct, nonvisible] [2:Flat and Intact] [3:Flat and Intact] Wound Margin: [1:Medium (34-66%)] [2:Small (1-33%)] [3:Large (67-100%)] Granulation A mount: [1:Pink, Pale] [2:Pink] [3:Pink] Granulation Quality: [1:Medium (34-66%)] [2:None Present (0%)] [3:None Present (0%)] Necrotic Amount: [1:Fat Layer (Subcutaneous Tissue): Yes Fascia: No] [3:Fat Layer (Subcutaneous Tissue): Yes] Exposed Structures: [1:Fascia: No Tendon: No Muscle: No Joint: No Bone: No None] [2:Fat Layer (Subcutaneous Tissue): No Tendon: No Muscle: No Joint: No Bone: No Limited to Skin Breakdown Large (67-100%)] [3:Fascia: No Tendon: No Muscle: No Joint: No Bone: No Medium  (34-66%)] Epithelialization: [1:Debridement - Excisional] [2:N/A] [3:N/A] Debridement: Pre-procedure Verification/Time Out 13:24 [2:N/A] [3:N/A] Taken: [1:Other] [2:N/A] [3:N/A] Pain Control: [1:Subcutaneous] [2:N/A] [3:N/A] Tissue Debrided: [1:Skin/Subcutaneous Tissue] [2:N/A] [3:N/A] Level: [1:49] [2:N/A] [3:N/A] Debridement A (sq cm): [1:rea Curette] [2:N/A] [3:N/A] Instrument: [1:Minimum] [2:N/A] [3:N/A] Bleeding: [1:Pressure] [2:N/A] [3:N/A] Hemostasis A chieved: [1:Procedure was tolerated well] [2:N/A] [3:N/A] Debridement Treatment Response: [1:14x13.5x0.1] [2:N/A] [3:N/A] Post Debridement Measurements L x W x D (cm) [1:14.844] [2:N/A] [3:N/A] Post Debridement Volume: (cm) [1:Compression Therapy] [2:Compression Therapy] [3:Compression Therapy] Procedures Performed: [1:Debridement] [2:4 N/A] [3:N/A] Photos: [1:No Photos Left, Lateral Lower Leg] [2:N/A N/A] [3:N/A N/A] Wound Location: [1:Gradually Appeared] [2:N/A] [3:N/A] Wounding Event: [1:Venous Leg Ulcer] [2:N/A] [3:N/A] Primary Etiology: [1:Peripheral Venous Disease,] [2:N/A]  [3:N/A] Comorbid History: [1:Osteoarthritis 06/02/2020] [2:N/A] [3:N/A]  Date Acquired: [1:1] [2:N/A] [3:N/A] Weeks of Treatment: [1:Healed - Epithelialized] [2:N/A] [3:N/A] Wound Status: [1:0x0x0] [2:N/A] [3:N/A] Measurements L x W x D (cm) [1:0] [2:N/A] [3:N/A] A (cm) : rea [1:0] [2:N/A] [3:N/A] Volume (cm) : [1:100.00%] [2:N/A] [3:N/A] % Reduction in Area: [1:100.00%] [2:N/A] [3:N/A] % Reduction in Volume: [1:Full Thickness Without Exposed] [2:N/A] [3:N/A] Classification: [1:Support Structures None Present] [2:N/A] [3:N/A] Exudate Amount: [1:N/A] [2:N/A] [3:N/A] Exudate Type: [1:N/A] [2:N/A] [3:N/A] Exudate Color: [1:No] [2:N/A] [3:N/A] Foul Odor A Cleansing: [1:fter N/A] [2:N/A] [3:N/A] Odor Anticipated Due to Product Use: [1:N/A] [2:N/A] [3:N/A] Wound Margin: [1:None Present (0%)] [2:N/A] [3:N/A] Granulation A mount: [1:N/A] [2:N/A] [3:N/A] Granulation Quality: [1:None Present (0%)] [2:N/A] [3:N/A] Necrotic Amount: [1:Fascia: No] [2:N/A] [3:N/A] Exposed Structures: [1:Fat Layer (Subcutaneous Tissue): No Tendon: No Muscle: No Joint: No Bone: No Large (67-100%)] [2:N/A] [3:N/A] Epithelialization: [1:N/A] [2:N/A] [3:N/A] Debridement: [1:N/A] [2:N/A] [3:N/A] Pain Control: [1:N/A] [2:N/A] [3:N/A] Tissue Debrided: [1:N/A] [2:N/A] [3:N/A] Level: [1:N/A] [2:N/A] [3:N/A] Debridement A (sq cm): [1:rea N/A] [2:N/A] [3:N/A] Instrument: [1:N/A] [2:N/A] [3:N/A] Bleeding: [1:N/A] [2:N/A] [3:N/A] Hemostasis A chieved: Debridement Treatment Response: N/A [2:N/A] [3:N/A] Post Debridement Measurements L x N/A [2:N/A] [3:N/A] W x D (cm) [1:N/A] [2:N/A] [3:N/A] Post Debridement Volume: (cm) [1:N/A] [2:N/A] [3:N/A] Treatment Notes Electronic Signature(s) Signed: 10/11/2020 3:44:17 PM By: Baltazar Najjar MD Signed: 10/15/2020 6:08:57 PM By: Fonnie Mu RN Entered By: Baltazar Najjar on 10/11/2020  13:48:43 -------------------------------------------------------------------------------- Multi-Disciplinary Care Plan Details Patient Name: Date of Service: Leah [name], Leah Gouty NNE [name]. 10/11/2020 12:45 PM Medical Record Number: [id number] Patient Account Number: [id number] Date of Birth/Sex: Treating RN: 1930-05-26 (87 y.o. Roel Cluck Primary Care Yeshaya Vath: Hillard Danker Other Clinician: Referring Kiran Lapine: Treating Jovanka Westgate/Extender: Shara Blazing in Treatment: 1 Active Inactive Abuse / Safety / Falls / Self Care Management Nursing Diagnoses: Potential for falls Potential for injury related to falls Goals: Patient will remain injury free related to falls Date Initiated: 10/04/2020 Target Resolution Date: 12/07/2020 Goal Status: Active Patient/caregiver will verbalize understanding of skin care regimen Date Initiated: 10/04/2020 Target Resolution Date: 12/07/2020 Goal Status: Active Interventions: Assess fall risk on admission and as needed Assess: immobility, friction, shearing, incontinence upon admission and as needed Provide education on fall prevention Notes: Orientation to the Wound Care Program Nursing Diagnoses: Knowledge deficit related to the wound healing center program Goals: Patient/caregiver will verbalize understanding of the Wound Healing Center Program Date Initiated: 10/04/2020 Target Resolution Date: 11/02/2020 Goal Status: Active Interventions: Provide education on orientation to the wound center Notes: Venous Leg Ulcer Nursing Diagnoses: Knowledge deficit related to disease process and management Potential for venous Insuffiency (use before diagnosis confirmed) Goals: Patient/caregiver will verbalize understanding of disease process and disease management Date Initiated: 10/04/2020 Target Resolution Date: 11/02/2020 Goal Status: Active Interventions: Assess peripheral edema status every visit. Provide education on venous  insufficiency Notes: Electronic Signature(s) Signed: 10/11/2020 4:36:36 PM By: Antonieta Iba Entered By: Antonieta Iba on 10/11/2020 13:37:23 -------------------------------------------------------------------------------- Pain Assessment Details Patient Name: Date of Service: Leah Rend NNE [name]. 10/11/2020 12:45 PM Medical Record Number: [id number] Patient Account Number: [id number] Date of Birth/Sex: Treating RN: 1929-10-04 (87 y.o. Tommye Standard Primary Care Varsha Knock: Hillard Danker Other Clinician: Referring Haileyann Staiger: Treating Glorie Dowlen/Extender: Shara Blazing in Treatment: 1 Active Problems Location of Pain Severity and Description of Pain Patient Has Paino No Site Locations Rate the pain. Current Pain Level: 0 Worst Pain Level: 4 Least Pain Level: 0 Character of Pain Describe the Pain: Aching Pain Management and Medication Current Pain Management: Medication:  Yes Is the Current Pain Management Adequate: Adequate How does your wound impact your activities of daily livingo Sleep: No Bathing: No Appetite: No Relationship With Others: No Bladder Continence: No Emotions: No Bowel Continence: No Work: No Toileting: No Drive: No Dressing: No Hobbies: No Electronic Signature(s) Signed: 10/15/2020 4:50:33 PM By: Leah Gouty RN, BSN Entered By: Leah Gouty on 10/11/2020 13:05:12 -------------------------------------------------------------------------------- Patient/Caregiver Education Details Patient Name: Date of Service: Leah [name] 5/12/2022andnbsp12:45 PM Medical Record Number: [id number] Patient Account Number: [id number] Date of Birth/Gender: Treating RN: February 20, 1930 (87 y.o. Sue Lush Primary Care Physician: Pricilla Holm Other Clinician: Referring Physician: Treating Physician/Extender: Charlie Pitter in Treatment: 1 Education Assessment Education  Provided To: Patient Education Topics Provided Infection: Methods: Explain/Verbal Responses: State content correctly Venous: Methods: Explain/Verbal, Printed Responses: State content correctly Wound/Skin Impairment: Methods: Explain/Verbal, Printed Responses: State content correctly Electronic Signature(s) Signed: 10/11/2020 4:36:36 PM By: Lorrin Jackson Entered By: Lorrin Jackson on 10/11/2020 13:37:49 -------------------------------------------------------------------------------- Wound Assessment Details Patient Name: Date of Service: Leah Cater NNE [name]. 10/11/2020 12:45 PM Medical Record Number: [id number] Patient Account Number: [id number] Date of Birth/Sex: Treating RN: August 08, 1929 (87 y.o. Elam Dutch Primary Care Serayah Yazdani: Pricilla Holm Other Clinician: Referring Ritha Sampedro: Treating Geordan Xu/Extender: Charlie Pitter in Treatment: 1 Wound Status Wound Number: 1 Primary Etiology: Venous Leg Ulcer Wound Location: Right, Anterior Lower Leg Wound Status: Open Wounding Event: Gradually Appeared Comorbid History: Peripheral Venous Disease, Osteoarthritis Date Acquired: 06/02/2020 Weeks Of Treatment: 1 Clustered Wound: No Photos Wound Measurements Length: (cm) 14 Width: (cm) 13.5 Depth: (cm) 0.1 Area: (cm) 148.44 Volume: (cm) 14.844 % Reduction in Area: -3.8% % Reduction in Volume: -3.8% Epithelialization: None Tunneling: No Undermining: No Wound Description Classification: Full Thickness Without Exposed Support Structures Wound Margin: Indistinct, nonvisible Exudate Amount: Large Exudate Type: Purulent Exudate Color: yellow, brown, green Foul Odor After Cleansing: Yes Due to Product Use: No Slough/Fibrino Yes Wound Bed Granulation Amount: Medium (34-66%) Exposed Structure Granulation Quality: Pink, Pale Fascia Exposed: No Necrotic Amount: Medium (34-66%) Fat Layer (Subcutaneous Tissue) Exposed: Yes Necrotic  Quality: Adherent Slough Tendon Exposed: No Muscle Exposed: No Joint Exposed: No Bone Exposed: No Treatment Notes Wound #1 (Lower Leg) Wound Laterality: Right, Anterior Cleanser Wound Cleanser Discharge Instruction: Cleanse the wound with wound cleanser prior to applying a clean dressing using gauze sponges, not tissue or cotton balls. Soap and Water Discharge Instruction: May shower and wash wound with dial antibacterial soap and water prior to dressing change. Peri-Wound Care Triamcinolone 15 (g) Discharge Instruction: Use triamcinolone 15 (g) as directed Sween Lotion (Moisturizing lotion) Discharge Instruction: Apply moisturizing lotion as directed Topical Primary Dressing KerraCel Ag Gelling Fiber Dressing, 4x5 in (silver alginate) Discharge Instruction: Apply silver alginate to wound bed as instructed Secondary Dressing Woven Gauze Sponge, Non-Sterile 4x4 in Discharge Instruction: Apply over primary dressing as directed. ABD Pad, 8x10 Discharge Instruction: Apply over primary dressing as directed. Secured With Compression Wrap ThreePress (3 layer compression wrap) Discharge Instruction: Apply three layer compression as directed. Compression Stockings Add-Ons Electronic Signature(s) Signed: 10/11/2020 3:37:13 PM By: Sandre Kitty Signed: 10/15/2020 4:50:33 PM By: Leah Gouty RN, BSN Entered By: Sandre Kitty on 10/11/2020 15:33:38 -------------------------------------------------------------------------------- Wound Assessment Details Patient Name: Date of Service: Leah Cater NNE [name]. 10/11/2020 12:45 PM Medical Record Number: [id number] Patient Account Number: [id number] Date of Birth/Sex: Treating RN: December 15, 1929 (87 y.o. Elam Dutch Primary Care Quintin Hjort: Pricilla Holm Other Clinician: Referring Merriel Zinger: Treating Erikka Follmer/Extender: Charlie Pitter in  Treatment: 1 Wound Status Wound Number: 2 Primary Etiology:  Venous Leg Ulcer Wound Location: Left, Proximal, Posterior Lower Leg Wound Status: Open Wounding Event: Gradually Appeared Comorbid History: Peripheral Venous Disease, Osteoarthritis Date Acquired: 06/02/2020 Weeks Of Treatment: 1 Clustered Wound: No Photos Wound Measurements Length: (cm) 0.1 Width: (cm) 0.1 Depth: (cm) 0.1 Area: (cm) 0.008 Volume: (cm) 0.001 % Reduction in Area: 97.6% % Reduction in Volume: 97% Epithelialization: Large (67-100%) Tunneling: No Undermining: No Wound Description Classification: Full Thickness Without Exposed Support Structures Wound Margin: Flat and Intact Exudate Amount: Small Exudate Type: Serous Exudate Color: amber Foul Odor After Cleansing: No Slough/Fibrino No Wound Bed Granulation Amount: Small (1-33%) Exposed Structure Granulation Quality: Pink Fascia Exposed: No Necrotic Amount: None Present (0%) Fat Layer (Subcutaneous Tissue) Exposed: No Tendon Exposed: No Muscle Exposed: No Joint Exposed: No Bone Exposed: No Limited to Skin Breakdown Treatment Notes Wound #2 (Lower Leg) Wound Laterality: Left, Posterior, Proximal Cleanser Wound Cleanser Discharge Instruction: Cleanse the wound with wound cleanser prior to applying a clean dressing using gauze sponges, not tissue or cotton balls. Soap and Water Discharge Instruction: May shower and wash wound with dial antibacterial soap and water prior to dressing change. Peri-Wound Care Triamcinolone 15 (g) Discharge Instruction: Use triamcinolone 15 (g) as directed Sween Lotion (Moisturizing lotion) Discharge Instruction: Apply moisturizing lotion as directed Topical Primary Dressing KerraCel Ag Gelling Fiber Dressing, 4x5 in (silver alginate) Discharge Instruction: Apply silver alginate to wound bed as instructed Secondary Dressing Woven Gauze Sponge, Non-Sterile 4x4 in Discharge Instruction: Apply over primary dressing as directed. ABD Pad, 8x10 Discharge Instruction: Apply  over primary dressing as directed. Secured With Compression Wrap ThreePress (3 layer compression wrap) Discharge Instruction: Apply three layer compression as directed. Compression Stockings Add-Ons Electronic Signature(s) Signed: 10/11/2020 3:37:13 PM By: Sandre Kitty Signed: 10/15/2020 4:50:33 PM By: Leah Gouty RN, BSN Entered By: Sandre Kitty on 10/11/2020 15:32:52 -------------------------------------------------------------------------------- Wound Assessment Details Patient Name: Date of Service: Leah Cater NNE [name]. 10/11/2020 12:45 PM Medical Record Number: [id number] Patient Account Number: [id number] Date of Birth/Sex: Treating RN: 07-02-1929 (87 y.o. Elam Dutch Primary Care Jerolyn Flenniken: Pricilla Holm Other Clinician: Referring Sheppard Luckenbach: Treating Danaysha Kirn/Extender: Charlie Pitter in Treatment: 1 Wound Status Wound Number: 3 Primary Etiology: Venous Leg Ulcer Wound Location: Left, Distal, Posterior Lower Leg Wound Status: Open Wounding Event: Gradually Appeared Comorbid History: Peripheral Venous Disease, Osteoarthritis Date Acquired: 06/02/2020 Weeks Of Treatment: 1 Clustered Wound: No Photos Wound Measurements Length: (cm) 0.8 Width: (cm) 1 Depth: (cm) 0.1 Area: (cm) 0.628 Volume: (cm) 0.063 % Reduction in Area: -14.2% % Reduction in Volume: -14.5% Epithelialization: Medium (34-66%) Undermining: No Wound Description Classification: Full Thickness Without Exposed Support Structures Wound Margin: Flat and Intact Exudate Amount: Small Exudate Type: Serous Exudate Color: amber Foul Odor After Cleansing: No Slough/Fibrino No Wound Bed Granulation Amount: Large (67-100%) Exposed Structure Granulation Quality: Pink Fascia Exposed: No Necrotic Amount: None Present (0%) Fat Layer (Subcutaneous Tissue) Exposed: Yes Tendon Exposed: No Muscle Exposed: No Joint Exposed: No Bone Exposed: No Treatment  Notes Wound #3 (Lower Leg) Wound Laterality: Left, Posterior, Distal Cleanser Wound Cleanser Discharge Instruction: Cleanse the wound with wound cleanser prior to applying a clean dressing using gauze sponges, not tissue or cotton balls. Soap and Water Discharge Instruction: May shower and wash wound with dial antibacterial soap and water prior to dressing change. Peri-Wound Care Triamcinolone 15 (g) Discharge Instruction: Use triamcinolone 15 (g) as directed Sween Lotion (Moisturizing lotion) Discharge Instruction: Apply moisturizing lotion as directed  Topical Primary Dressing KerraCel Ag Gelling Fiber Dressing, 4x5 in (silver alginate) Discharge Instruction: Apply silver alginate to wound bed as instructed Secondary Dressing Woven Gauze Sponge, Non-Sterile 4x4 in Discharge Instruction: Apply over primary dressing as directed. ABD Pad, 8x10 Discharge Instruction: Apply over primary dressing as directed. Secured With Compression Wrap ThreePress (3 layer compression wrap) Discharge Instruction: Apply three layer compression as directed. Compression Stockings Add-Ons Electronic Signature(s) Signed: 10/11/2020 3:37:13 PM By: Karl Ito Signed: 10/15/2020 4:50:33 PM By: Zenaida Deed RN, BSN Entered By: Karl Ito on 10/11/2020 15:33:13 -------------------------------------------------------------------------------- Wound Assessment Details Patient Name: Date of Service: Leah Rend NNE [name]. 10/11/2020 12:45 PM Medical Record Number: [id number] Patient Account Number: [id number] Date of Birth/Sex: Treating RN: [birth year] 1930 (87 y.o. Tommye Standard Primary Care Lekisha Mcghee: Hillard Danker Other Clinician: Referring Landon Truax: Treating Aarik Blank/Extender: Shara Blazing in Treatment: 1 Wound Status Wound Number: 4 Primary Etiology: Venous Leg Ulcer Wound Location: Left, Lateral Lower Leg Wound Status: Healed - Epithelialized Wounding  Event: Gradually Appeared Comorbid History: Peripheral Venous Disease, Osteoarthritis Date Acquired: 06/02/2020 Weeks Of Treatment: 1 Clustered Wound: No Wound Measurements Length: (cm) Width: (cm) Depth: (cm) Area: (cm) Volume: (cm) 0 % Reduction in Area: 100% 0 % Reduction in Volume: 100% 0 Epithelialization: Large (67-100%) 0 Tunneling: No 0 Undermining: No Wound Description Classification: Full Thickness Without Exposed Support Structures Exudate Amount: None Present Foul Odor After Cleansing: No Slough/Fibrino No Wound Bed Granulation Amount: None Present (0%) Exposed Structure Necrotic Amount: None Present (0%) Fascia Exposed: No Fat Layer (Subcutaneous Tissue) Exposed: No Tendon Exposed: No Muscle Exposed: No Joint Exposed: No Bone Exposed: No Electronic Signature(s) Signed: 10/15/2020 4:50:33 PM By: Zenaida Deed RN, BSN Entered By: Zenaida Deed on 10/11/2020 13:08:10 -------------------------------------------------------------------------------- Vitals Details Patient Name: Date of Service: Leah [name], Leah Gouty NNE [name]. 10/11/2020 12:45 PM Medical Record Number: [id number] Patient Account Number: [id number] Date of Birth/Sex: Treating RN: 1929-09-26 (87 y.o. Tommye Standard Primary Care Miamor Ayler: Hillard Danker Other Clinician: Referring Mikelle Myrick: Treating Jara Feider/Extender: Shara Blazing in Treatment: 1 Vital Signs Time Taken: 13:02 Temperature (F): 98.2 Height (in): 60 Pulse (bpm): 74 Source: Stated Respiratory Rate (breaths/min): 18 Weight (lbs): 131 Blood Pressure (mmHg): 180/90 Source: Stated Reference Range: 80 - 120 mg / dl Body Mass Index (BMI): 25.6 Electronic Signature(s) Signed: 10/15/2020 4:50:33 PM By: Zenaida Deed RN, BSN Entered By: Zenaida Deed on 10/11/2020 13:04:27

## 2020-10-18 ENCOUNTER — Other Ambulatory Visit: Payer: Self-pay

## 2020-10-18 ENCOUNTER — Encounter (HOSPITAL_BASED_OUTPATIENT_CLINIC_OR_DEPARTMENT_OTHER): Payer: Medicare PPO | Admitting: Internal Medicine

## 2020-10-18 DIAGNOSIS — I872 Venous insufficiency (chronic) (peripheral): Secondary | ICD-10-CM | POA: Diagnosis not present

## 2020-10-18 DIAGNOSIS — L97812 Non-pressure chronic ulcer of other part of right lower leg with fat layer exposed: Secondary | ICD-10-CM | POA: Diagnosis not present

## 2020-10-18 DIAGNOSIS — I87333 Chronic venous hypertension (idiopathic) with ulcer and inflammation of bilateral lower extremity: Secondary | ICD-10-CM | POA: Diagnosis not present

## 2020-10-18 DIAGNOSIS — L97822 Non-pressure chronic ulcer of other part of left lower leg with fat layer exposed: Secondary | ICD-10-CM | POA: Diagnosis not present

## 2020-10-18 NOTE — Progress Notes (Signed)
TEALE, YOCK T ([id number]) . Visit Report for 10/18/2020 Debridement Details Patient Name: Date of Service: Leah [name] NNE T. 10/18/2020 1:45 PM Medical Record Number: [id number] Patient Account Number: [id number] Date of Birth/Sex: Treating RN: [birth year] (87 y.o. Leah [name], Leah [name] Primary Care Provider: Pricilla Holm Other Clinician: Referring Provider: Treating Provider/Extender: Charlie Pitter in Treatment: 2 Debridement Performed for Assessment: Wound #1 Right,Anterior Lower Leg Performed By: Physician Ricard Dillon., MD Debridement Type: Debridement Severity of Tissue Pre Debridement: Fat layer exposed Level of Consciousness (Pre-procedure): Awake and Alert Pre-procedure Verification/Time Out Yes - 14:50 Taken: Start Time: 14:51 Pain Control: Lidocaine 4% T opical Solution T Area Debrided (L x W): otal 4 (cm) x 4 (cm) = 16 (cm) Tissue and other material debrided: Viable, Non-Viable, Slough, Subcutaneous, Skin: Dermis , Skin: Epidermis, Fibrin/Exudate, Slough Level: Skin/Subcutaneous Tissue Debridement Description: Excisional Instrument: Curette Bleeding: Moderate Hemostasis Achieved: Pressure End Time: 14:54 Procedural Pain: 0 Post Procedural Pain: 0 Response to Treatment: Procedure was tolerated well Level of Consciousness (Post- Awake and Alert procedure): Post Debridement Measurements of Total Wound Length: (cm) 14 Width: (cm) 10.5 Depth: (cm) 0.1 Volume: (cm) 11.545 Character of Wound/Ulcer Post Debridement: Requires Further Debridement Severity of Tissue Post Debridement: Fat layer exposed Post Procedure Diagnosis Same as Pre-procedure Electronic Signature(s) Signed: 10/18/2020 4:52:14 PM By: Linton Ham MD Signed: 10/18/2020 6:00:49 PM By: Deon Pilling Entered By: Linton Ham on 10/18/2020 14:57:32 -------------------------------------------------------------------------------- HPI Details Patient Name:  Date of Service: Leah [name], SUZA NNE T. 10/18/2020 1:45 PM Medical Record Number: [id number] Patient Account Number: [id number] Date of Birth/Sex: Treating RN: [birth year] (87 y.o. [name] Primary Care Provider: Pricilla Holm Other Clinician: Referring Provider: Treating Provider/Extender: Charlie Pitter in Treatment: 2 History of Present Illness HPI Description: ADMISSION 10/04/2020; This is an 87 year old woman who lives independently. Her husband was actually a longstanding patient in this clinic who passed away recently. She states her problem began in January with increased swelling in her legs she has a large area on the right anterior lower leg and 3 smaller areas on the left. She has a history of chronic venous insufficiency. I note that she had DVT studies on 03/29/2020 that were negative for DVT She was in her primary doctor's office late . in April and she was given Silvadene cream but she does not feel this has been helping. Recently she has been using Vaseline she has compression stockings but does not wear them reliably including juxta lites which belonged to her husband Past medical history includes chronic venous insufficiency, stage III chronic kidney disease, sciatica, peripheral edema, pelvic fractures ABIs in our clinic were normal 1.13 on the right and 1.06 on the left 5/12; arrives in clinic today with only 1 wound left on the left leg which is the posterior left calf. Still an extensive area on the right anterior lower leg. Our intake nurse reports malodorous purulent looking drainage. We have been using silver alginate under compression 3 layer 5/19 the left posterior calf is closed. Again I get description of this of the right anterior leg is having a lot of drainage and malodor although she only appears to have a cluster of small wounds which really do not appear to be infected. The PCR culture I did last week showed Pseudomonas  and staph aureus. I put this forward to [facility] although I think I am just going to use gentamicin on this under compression. I gave her Keflex last week but even  the staff was likely to be MRSA. Electronic Signature(s) Signed: 10/18/2020 4:52:14 PM By: Linton Ham MD Entered By: Linton Ham on 10/18/2020 15:01:08 -------------------------------------------------------------------------------- Physical Exam Details Patient Name: Date of Service: Leah [name] NNE T. 10/18/2020 1:45 PM Medical Record Number: [id number] Patient Account Number: [id number] Date of Birth/Sex: Treating RN: 1929/07/25 (87 y.o. [name] Primary Care Provider: Pricilla Holm Other Clinician: Referring Provider: Treating Provider/Extender: Charlie Pitter in Treatment: 2 Constitutional Patient is hypertensive.. Pulse regular and within target range for patient.Marland Kitchen Respirations regular, non-labored and within target range.. Temperature is normal and within the target range for the patient.Marland Kitchen Appears in no distress. Notes Wound exam; the superficial areas on the left posterior calf are both closed. Continues to have an odd collection of small open areas on the right anterior. Much of this appears to be epithelialized perhaps 80% still some small wound areas with debris I used a #5 curette to remove this. Electronic Signature(s) Signed: 10/18/2020 4:52:14 PM By: Linton Ham MD Entered By: Linton Ham on 10/18/2020 15:00:33 -------------------------------------------------------------------------------- Physician Orders Details Patient Name: Date of Service: Leah [name] NNE T. 10/18/2020 1:45 PM Medical Record Number: [id number] Patient Account Number: [id number] Date of Birth/Sex: Treating RN: 12-14-1929 (86 y.o. 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 - Dr. Dellia Nims Return A Bathing/ Shower/ Hygiene May shower with protection but do not get wound dressing(s) wet. - use a cast protector. Edema Control - Lymphedema / SCD / Other Elevate legs to the level of the heart or above for 30 minutes daily and/or when sitting, a frequency of: - throughout the day. Avoid standing for long periods of time. Exercise regularly Moisturize legs daily. - apply lotion every night before bed to left leg. Compression stocking or Garment 20-30 mm/Hg pressure to: - Left leg apply Jobst compression stocking. Apply in the morning and remove at night. Additional Orders / Instructions Follow Nutritious Diet Other: - ****Keep phone on you for when [place] to call regarding topical antibiotics. T the pharmacy to place on hold for now.**** ell Wound Treatment Wound #1 - Lower Leg Wound Laterality: Right, Anterior Cleanser: Soap and Water 1 x Per Week Discharge Instructions: May shower and wash wound with dial antibacterial soap and water prior to dressing change. Cleanser: Wound Cleanser 1 x Per Week Discharge Instructions: Cleanse the wound with wound cleanser prior to applying a clean dressing using gauze sponges, not tissue or cotton balls. Peri-Wound Care: Triamcinolone 15 (g) 1 x Per Week Discharge Instructions: Apply liberally to wounds and periwound. Use triamcinolone 15 (g) as directed Peri-Wound Care: Sween Lotion (Moisturizing lotion) 1 x Per  Week Discharge Instructions: Apply moisturizing lotion as directed Topical: Gentamicin 1 x Per Week Discharge Instructions: Apply under alginate Ag. As directed by physician Topical: Triamcinolone 1 x Per Week Discharge Instructions: Apply Triamcinolone to wounds. Prim Dressing: KerraCel Ag Gelling Fiber Dressing, 4x5 in (silver alginate) 1 x Per Week ary Discharge Instructions: Apply silver alginate to wound bed as instructed Secondary Dressing: Woven  Gauze Sponge, Non-Sterile 4x4 in 1 x Per Week Discharge Instructions: Apply over primary dressing as directed. Secondary Dressing: ABD Pad, 8x10 1 x Per Week Discharge Instructions: Apply over primary dressing as directed. Secondary Dressing: Zetuvit Plus 4x8 in 1 x Per Week Discharge Instructions: Apply over primary dressing as directed. Compression Wrap: ThreePress (3 layer compression wrap) 1 x Per Week Discharge Instructions: Apply three layer compression as directed. Electronic Signature(s) Signed: 10/18/2020 4:52:14 PM By: Linton Ham MD Signed: 10/18/2020 6:00:49 PM By: Deon Pilling Entered By: Deon Pilling on 10/18/2020 14:54:01 -------------------------------------------------------------------------------- Problem List Details Patient Name: Date of Service: Leah [name], Asa Saunas NNE T. 10/18/2020 1:45 PM Medical Record Number: [id number] Patient Account Number: [id number] Date of Birth/Sex: Treating RN: 1930-03-02 (87 y.o. 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 10/18/2020 1:45 PM Medical Record Number: [id number] Patient Account Number: [id number] Date of Birth/Sex: Treating RN: 04-Dec-1929 (87 y.o. [name] Primary Care Provider: Pricilla Holm Other Clinician: Referring Provider: Treating Provider/Extender: Charlie Pitter in Treatment: 2 Subjective History of Present Illness (HPI) ADMISSION 10/04/2020; This is an 87 year old woman who lives independently. Her husband was actually a longstanding patient in this clinic who passed away recently. She states her problem began in January with increased swelling in her legs she has a large area on the right anterior lower leg and 3 smaller areas on the left. She has a history of chronic venous insufficiency. I note that she had DVT studies on 03/29/2020 that were negative for DVT She was in her primary doctor's office late . in April and she was given Silvadene cream but she does not feel this has been helping. Recently she has been using Vaseline she has compression stockings but does not wear them reliably including juxta lites which belonged to her husband Past medical history includes chronic venous insufficiency, stage III chronic kidney disease, sciatica, peripheral edema, pelvic fractures ABIs in our clinic were normal 1.13 on the right and 1.06 on the left 5/12; arrives in clinic today with only 1 wound left on the left leg which is  the posterior left calf. Still an extensive area on the right anterior lower leg. Our intake nurse reports malodorous purulent looking drainage. We have been using silver alginate under compression 3 layer 5/19 the left posterior calf is closed. Again I get description of this of the right anterior leg is having a lot of drainage and malodor although she only appears to have a cluster of small wounds which really do not appear to be infected. The PCR culture I did last week showed Pseudomonas and staph aureus. I put this forward to [facility] although I think I am just going to use gentamicin on this under compression. I gave her Keflex last week  but even the staff was likely to be MRSA. Objective Constitutional Patient is hypertensive.. Pulse regular and within target range for patient.Marland Kitchen Respirations regular, non-labored and within target range.. Temperature is normal and within the target range for the patient.Marland Kitchen Appears in no distress. Vitals Time Taken: 2:17 PM, Height: 60 in, Weight: 131 lbs, BMI: 25.6, Temperature: 97.9 F, Pulse: 74 bpm, Respiratory Rate: 18 breaths/min, Blood Pressure: 174/70 mmHg. General Notes: Wound exam; the superficial areas on the left posterior calf are both closed. oo Continues to have an odd collection of small open areas on the right anterior. Much of this appears to be epithelialized perhaps 80% still some small wound areas with debris I used a #5 curette to remove this. Integumentary (Hair, Skin) Wound #1 status is Open. Original cause of wound was Gradually Appeared. The date acquired was: 06/02/2020. The wound has been in treatment 2 weeks. The wound is located on the Right,Anterior Lower Leg. The wound measures 12cm length x 10.5cm width x 0.1cm depth; 98.96cm^2 area and 9.896cm^3 volume. There is Fat Layer (Subcutaneous Tissue) exposed. There is no tunneling or undermining noted. There is a large amount of purulent drainage noted. Foul odor after cleansing was  noted. The wound margin is indistinct and nonvisible. There is large (67-100%) pink, pale granulation within the wound bed. There is a small (1-33%) amount of necrotic tissue within the wound bed including Adherent Slough. Wound #2 status is Healed - Epithelialized. Original cause of wound was Gradually Appeared. The date acquired was: 06/02/2020. The wound has been in treatment 2 weeks. The wound is located on the Left,Proximal,Posterior Lower Leg. The wound measures 0cm length x 0cm width x 0cm depth; 0cm^2 area and 0cm^3 volume. Wound #3 status is Healed - Epithelialized. Original cause of wound was Gradually Appeared. The date acquired was: 06/02/2020. The wound has been in treatment 2 weeks. The wound is located on the Left,Distal,Posterior Lower Leg. The wound measures 0cm length x 0cm width x 0cm depth; 0cm^2 area and 0cm^3 volume. Assessment Active Problems ICD-10 Chronic venous hypertension (idiopathic) with ulcer and inflammation of bilateral lower extremity Non-pressure chronic ulcer of other part of right lower leg with other specified severity Procedures Wound #1 Pre-procedure diagnosis of Wound #1 is a Venous Leg Ulcer located on the Right,Anterior Lower Leg .Severity of Tissue Pre Debridement is: Fat layer exposed. There was a Excisional Skin/Subcutaneous Tissue Debridement with a total area of 16 sq cm performed by Ricard Dillon., MD. With the following instrument(s): Curette to remove Viable and Non-Viable tissue/material. Material removed includes Subcutaneous Tissue, Slough, Skin: Dermis, Skin: Epidermis, and Fibrin/Exudate after achieving pain control using Lidocaine 4% Topical Solution. A time out was conducted at 14:50, prior to the start of the procedure. A Moderate amount of bleeding was controlled with Pressure. The procedure was tolerated well with a pain level of 0 throughout and a pain level of 0 following the procedure. Post Debridement Measurements: 14cm length x  10.5cm width x 0.1cm depth; 11.545cm^3 volume. Character of Wound/Ulcer Post Debridement requires further debridement. Severity of Tissue Post Debridement is: Fat layer exposed. Post procedure Diagnosis Wound #1: Same as Pre-Procedure Plan Follow-up Appointments: Return Appointment in 1 week. - Dr. Dellia Nims Bathing/ Shower/ Hygiene: May shower with protection but do not get wound dressing(s) wet. - use a cast protector. Edema Control - Lymphedema / SCD / Other: Elevate legs to the level of the heart or above for 30 minutes daily and/or when sitting, a frequency of: - throughout  the day. Avoid standing for long periods of time. Exercise regularly Moisturize legs daily. - apply lotion every night before bed to left leg. Compression stocking or Garment 20-30 mm/Hg pressure to: - Left leg apply Jobst compression stocking. Apply in the morning and remove at night. Additional Orders / Instructions: Follow Nutritious Diet Other: - ****Keep phone on you for when [place] to call regarding topical antibiotics. T the pharmacy to place on hold for now.**** ell WOUND #1: - Lower Leg Wound Laterality: Right, Anterior Cleanser: Soap and Water 1 x Per Week/ Discharge Instructions: May shower and wash wound with dial antibacterial soap and water prior to dressing change. Cleanser: Wound Cleanser 1 x Per Week/ Discharge Instructions: Cleanse the wound with wound cleanser prior to applying a clean dressing using gauze sponges, not tissue or cotton balls. Peri-Wound Care: Triamcinolone 15 (g) 1 x Per Week/ Discharge Instructions: Apply liberally to wounds and periwound. Use triamcinolone 15 (g) as directed Peri-Wound Care: Sween Lotion (Moisturizing lotion) 1 x Per Week/ Discharge Instructions: Apply moisturizing lotion as directed Topical: Gentamicin 1 x Per Week/ Discharge Instructions: Apply under alginate Ag. As directed by physician Topical: Triamcinolone 1 x Per Week/ Discharge Instructions:  Apply Triamcinolone to wounds. Prim Dressing: KerraCel Ag Gelling Fiber Dressing, 4x5 in (silver alginate) 1 x Per Week/ ary Discharge Instructions: Apply silver alginate to wound bed as instructed Secondary Dressing: Woven Gauze Sponge, Non-Sterile 4x4 in 1 x Per Week/ Discharge Instructions: Apply over primary dressing as directed. Secondary Dressing: ABD Pad, 8x10 1 x Per Week/ Discharge Instructions: Apply over primary dressing as directed. Secondary Dressing: Zetuvit Plus 4x8 in 1 x Per Week/ Discharge Instructions: Apply over primary dressing as directed. Com pression Wrap: ThreePress (3 layer compression wrap) 1 x Per Week/ Discharge Instructions: Apply three layer compression as directed. 1. We put TCA, gentamicin under silver alginate 2. I vigorously washed off the entire area with wound cleanser 3. I am going to forego the [facility] antibiotics for now. Simply use gentamicin which even staph might be sensitive to. I do not see evidence of an active soft tissue cellulitis although I am a bit puzzled as to where the drainage is coming from 4. She says this started with trauma on the dishwasher door. I looked back on my records and I do not really see this 5. Careful attention to this next week Electronic Signature(s) Signed: 10/18/2020 4:52:14 PM By: Linton Ham MD Entered By: Linton Ham on 10/18/2020 15:04:09 -------------------------------------------------------------------------------- SuperBill Details Patient Name: Date of Service: Leah [name], SUZA NNE T. 10/18/2020 Medical Record Number: [id number] Patient Account Number: [id number] Date of Birth/Sex: Treating RN: [birth year] (87 y.o. Leah [name], Meta.Reding Primary Care Provider: Pricilla Holm Other Clinician: Referring Provider: Treating Provider/Extender: Charlie Pitter in Treatment: 2 Diagnosis Coding ICD-10 Codes Code Description [phone] Chronic venous hypertension (idiopathic)  with ulcer and inflammation of bilateral lower extremity L97.818 Non-pressure chronic ulcer of other part of right lower leg with other specified severity Facility Procedures CPT4 Code: [id number] Description: 60737 - DEB SUBQ TISSUE 20 SQ CM/< ICD-10 Diagnosis Description I87.333 Chronic venous hypertension (idiopathic) with ulcer and inflammation of bilatera L97.818 Non-pressure chronic ulcer of other part of right lower leg with other specified Modifier: l lower extremity severity Quantity: 1 Physician Procedures Electronic Signature(s) Signed: 10/18/2020 4:52:14 PM By: Linton Ham MD Entered By: Linton Ham on 10/18/2020 15:04:22

## 2020-10-18 NOTE — Progress Notes (Signed)
MERYN, SARRACINO T ([id number]) . Visit Report for 10/18/2020 Arrival Information Details Patient Name: Date of Service: Carlisle Cater NNE T. 10/18/2020 1:45 PM Medical Record Number: [id number] Patient Account Number: [id number] Date of Birth/Sex: Treating RN: 01/21/1930 (87 y.o. Sue Lush Primary Care Shireen Rayburn: Pricilla Holm Other Clinician: Referring Taiana Temkin: Treating Joyce Leckey/Extender: Charlie Pitter in Treatment: 2 Visit Information History Since Last Visit Added or deleted any medications: No Patient Arrived: Kasandra Knudsen Any new allergies or adverse reactions: No Arrival Time: 14:17 Had a fall or experienced change in No Transfer Assistance: None activities of daily living that may affect Patient Identification Verified: Yes risk of falls: Secondary Verification Process Completed: Yes Signs or symptoms of abuse/neglect since last visito No Patient Requires Transmission-Based Precautions: No Hospitalized since last visit: No Patient Has Alerts: No Implantable device outside of the clinic excluding No cellular tissue based products placed in the center since last visit: Has Dressing in Place as Prescribed: Yes Has Compression in Place as Prescribed: Yes Pain Present Now: No Electronic Signature(s) Signed: 10/18/2020 5:39:50 PM By: Lorrin Jackson Entered By: Lorrin Jackson on 10/18/2020 14:17:35 -------------------------------------------------------------------------------- Compression Therapy Details Patient Name: Date of Service: Carlisle Cater NNE T. 10/18/2020 1:45 PM Medical Record Number: [id number] Patient Account Number: [id number] Date of Birth/Sex: Treating RN: [birth year] (87 y.o. [name] Primary Care Audra Bellard: Pricilla Holm Other Clinician: Referring Geniece Akers: Treating Wiliam Cauthorn/Extender: Charlie Pitter in Treatment: 2 Compression Therapy Performed for Wound Assessment: Wound #1  Right,Anterior Lower Leg Performed By: Clinician Baruch Gouty, RN Compression Type: Three Layer Post Procedure Diagnosis Same as Pre-procedure Electronic Signature(s) Signed: 10/18/2020 6:00:49 PM By: Deon Pilling Entered By: Deon Pilling on 10/18/2020 17:33:13 -------------------------------------------------------------------------------- Encounter Discharge Information Details Patient Name: Date of Service: Alben Spittle, Asa Saunas NNE T. 10/18/2020 1:45 PM Medical Record Number: [id number] Patient Account Number: [id number] Date of Birth/Sex: Treating RN: [birth year] (87 y.o. Elam Dutch Primary Care Sabrinna Yearwood: Pricilla Holm Other Clinician: Referring Larken Urias: Treating Aislyn Hayse/Extender: Charlie Pitter in Treatment: 2 Encounter Discharge Information Items Post Procedure Vitals Discharge Condition: Stable Temperature (F): 97.9 Ambulatory Status: Ambulatory Pulse (bpm): 74 Discharge Destination: Home Respiratory Rate (breaths/min): 18 Transportation: Private Auto Blood Pressure (mmHg): 174/70 Accompanied By: self Schedule Follow-up Appointment: Yes Clinical Summary of Care: Patient Declined Electronic Signature(s) Signed: 10/18/2020 5:19:49 PM By: Baruch Gouty RN, BSN Entered By: Baruch Gouty on 10/18/2020 15:34:04 -------------------------------------------------------------------------------- Lower Extremity Assessment Details Patient Name: Date of Service: Carlisle Cater NNE T. 10/18/2020 1:45 PM Medical Record Number: [id number] Patient Account Number: [id number] Date of Birth/Sex: Treating RN: 1930/02/23 (87 y.o. Sue Lush Primary Care [place] Agent: Pricilla Holm Other Clinician: Referring Jedadiah Abdallah: Treating Joey Lierman/Extender: Charlie Pitter in Treatment: 2 Edema Assessment Assessed: Shirlyn Goltz: Yes] Patrice Paradise: Yes] Edema: [Left: Yes] [Right: Yes] Calf Left: Right: Point of Measurement: 29  cm From Medial Instep 32 cm 33 cm Ankle Left: Right: Point of Measurement: 10 cm From Medial Instep 19.6 cm 20 cm Vascular Assessment Pulses: Dorsalis Pedis Palpable: [Left:No] [Right:Yes] Electronic Signature(s) Signed: 10/18/2020 5:39:50 PM By: Lorrin Jackson Entered By: Lorrin Jackson on 10/18/2020 14:30:39 -------------------------------------------------------------------------------- Multi Wound Chart Details Patient Name: Date of Service: Alben Spittle, Asa Saunas NNE T. 10/18/2020 1:45 PM Medical Record Number: [id number] Patient Account Number: [id number] Date of Birth/Sex: Treating RN: September 08, 1929 (87 y.o. [name] Primary Care Belladonna Lubinski: Pricilla Holm Other Clinician: Referring Maliha Outten: Treating Ronon Ferger/Extender: Charlie Pitter in Treatment: 2 Vital Signs Height(in): 60 Pulse(bpm): 74 Weight(lbs): 131  Blood Pressure(mmHg): 174/70 Body Mass Index(BMI): 26 Temperature(F): 97.9 Respiratory Rate(breaths/min): 18 Photos: [1:No Photos Right, Anterior Lower Leg] [2:No Photos Left, Proximal, Posterior Lower Leg] [3:No Photos Left, Distal, Posterior Lower Leg] Wound Location: [1:Gradually Appeared] [2:Gradually Appeared] [3:Gradually Appeared] Wounding Event: [1:Venous Leg Ulcer] [2:Venous Leg Ulcer] [3:Venous Leg Ulcer] Primary Etiology: [1:Peripheral Venous Disease,] [2:Peripheral Venous Disease,] [3:Peripheral Venous Disease,] Comorbid History: [1:Osteoarthritis 06/02/2020] [2:Osteoarthritis 06/02/2020] [3:Osteoarthritis 06/02/2020] Date Acquired: [1:2] [2:2] [3:2] Weeks of Treatment: [1:Open] [2:Healed - Epithelialized] [3:Healed - Epithelialized] Wound Status: [1:Yes] [2:No] [3:No] Clustered Wound: [1:8] [2:N/A] [3:N/A] Clustered Quantity: [1:12x10.5x0.1] [2:0x0x0] [3:0x0x0] Measurements L x W x D (cm) [1:98.96] [2:0] [3:0] A (cm) : rea [1:9.896] [2:0] [3:0] Volume (cm) : [1:30.80%] [2:100.00%] [3:100.00%] % Reduction in Area: [1:30.80%]  [2:100.00%] [3:100.00%] % Reduction in Volume: [1:Full Thickness Without Exposed] [2:Full Thickness Without Exposed] [3:Full Thickness Without Exposed] Classification: [1:Support Structures Large] [2:Support Structures N/A] [3:Support Structures N/A] Exudate Amount: [1:Purulent] [2:N/A] [3:N/A] Exudate Type: [1:yellow, brown, green] [2:N/A] [3:N/A] Exudate Color: [1:Yes] [2:N/A] [3:N/A] Foul Odor A Cleansing: [1:fter No] [2:N/A] [3:N/A] Odor Anticipated Due to Product Use: [1:Indistinct, nonvisible] [2:N/A] [3:N/A] Wound Margin: [1:Large (67-100%)] [2:N/A] [3:N/A] Granulation A mount: [1:Pink, Pale] [2:N/A] [3:N/A] Granulation Quality: [1:Small (1-33%)] [2:N/A] [3:N/A] Necrotic Amount: [1:Fat Layer (Subcutaneous Tissue): Yes N/A] [3:N/A] Exposed Structures: [1:Fascia: No Tendon: No Muscle: No Joint: No Bone: No Small (1-33%)] [2:N/A] [3:N/A] Epithelialization: [1:Debridement - Excisional] [2:N/A] [3:N/A] Debridement: Pre-procedure Verification/Time Out 14:50 [2:N/A] [3:N/A] Taken: [1:Lidocaine 4% Topical Solution] [2:N/A] [3:N/A] Pain Control: [1:Subcutaneous, Slough] [2:N/A] [3:N/A] Tissue Debrided: [1:Skin/Subcutaneous Tissue] [2:N/A] [3:N/A] Level: [1:16] [2:N/A] [3:N/A] Debridement A (sq cm): [1:rea Curette] [2:N/A] [3:N/A] Instrument: [1:Moderate] [2:N/A] [3:N/A] Bleeding: [1:Pressure] [2:N/A] [3:N/A] Hemostasis A chieved: [1:0] [2:N/A] [3:N/A] Procedural Pain: [1:0] [2:N/A] [3:N/A] Post Procedural Pain: [1:Procedure was tolerated well] [2:N/A] [3:N/A] Debridement Treatment Response: [1:14x10.5x0.1] [2:N/A] [3:N/A] Post Debridement Measurements L x W x D (cm) [1:11.545] [2:N/A] [3:N/A] Post Debridement Volume: (cm) [1:Debridement] [2:N/A] [3:N/A] Treatment Notes Electronic Signature(s) Signed: 10/18/2020 4:52:14 PM By: Linton Ham MD Signed: 10/18/2020 6:00:49 PM By: Deon Pilling Entered By: Linton Ham on 10/18/2020  14:57:22 -------------------------------------------------------------------------------- Multi-Disciplinary Care Plan Details Patient Name: Date of Service: Alben Spittle, SUZA NNE T. 10/18/2020 1:45 PM Medical Record Number: [id number] Patient Account Number: [id number] Date of Birth/Sex: Treating RN: [birth year] (87 y.o. [name], [name] Primary Care Inella Kuwahara: Pricilla Holm Other Clinician: Referring Margarie Mcguirt: Treating Sharay Bellissimo/Extender: Charlie Pitter in Treatment: 2 Active Inactive Abuse / Safety / Falls / Self Care Management Nursing Diagnoses: Potential for falls Potential for injury related to falls Goals: Patient will remain injury free related to falls Date Initiated: 10/04/2020 Target Resolution Date: 12/07/2020 Goal Status: Active Patient/caregiver will verbalize understanding of skin care regimen Date Initiated: 10/04/2020 Target Resolution Date: 12/07/2020 Goal Status: Active Interventions: Assess fall risk on admission and as needed Assess: immobility, friction, shearing, incontinence upon admission and as needed Provide education on fall prevention Notes: Venous Leg Ulcer Nursing Diagnoses: Knowledge deficit related to disease process and management Potential for venous Insuffiency (use before diagnosis confirmed) Goals: Patient/caregiver will verbalize understanding of disease process and disease management Date Initiated: 10/04/2020 Target Resolution Date: 11/02/2020 Goal Status: Active Interventions: Assess peripheral edema status every visit. Provide education on venous insufficiency Notes: Electronic Signature(s) Signed: 10/18/2020 6:00:49 PM By: Deon Pilling Entered By: Deon Pilling on 10/18/2020 14:37:08 -------------------------------------------------------------------------------- Pain Assessment Details Patient Name: Date of Service: Carlisle Cater NNE T. 10/18/2020 1:45 PM Medical Record Number: [id number] Patient  Account Number: [id number] Date of  Birth/Sex: Treating RN: 27-Oct-1929 (87 y.o. Sue Lush Primary Care Lindora Alviar: Pricilla Holm Other Clinician: Referring Susanne Baumgarner: Treating Tonnya Garbett/Extender: Charlie Pitter in Treatment: 2 Active Problems Location of Pain Severity and Description of Pain Patient Has Paino No Site Locations Pain Management and Medication Current Pain Management: Electronic Signature(s) Signed: 10/18/2020 5:39:50 PM By: Lorrin Jackson Entered By: Lorrin Jackson on 10/18/2020 14:18:48 -------------------------------------------------------------------------------- Patient/Caregiver Education Details Patient Name: Date of Service: Sheliah Hatch 5/19/2022andnbsp1:45 PM Medical Record Number: [id number] Patient Account Number: [id number] Date of Birth/Gender: Treating RN: [birth year] (87 y.o. [name] Primary Care Physician: Pricilla Holm Other Clinician: Referring Physician: Treating Physician/Extender: Charlie Pitter in Treatment: 2 Education Assessment Education Provided To: Patient Education Topics Provided Safety: Handouts: Personal Safety Methods: Explain/Verbal Responses: Reinforcements needed Electronic Signature(s) Signed: 10/18/2020 6:00:49 PM By: Deon Pilling Entered By: Deon Pilling on 10/18/2020 14:37:32 -------------------------------------------------------------------------------- Wound Assessment Details Patient Name: Date of Service: Carlisle Cater NNE T. 10/18/2020 1:45 PM Medical Record Number: [id number] Patient Account Number: [id number] Date of Birth/Sex: Treating RN: 09-Oct-1929 (87 y.o. [name], Meta.Reding Primary Care Ruxin Ransome: Pricilla Holm Other Clinician: Referring Sharne Linders: Treating Ilee Randleman/Extender: Charlie Pitter in Treatment: 2 Wound Status Wound Number: 1 Primary Etiology: Venous Leg Ulcer Wound  Location: Right, Anterior Lower Leg Wound Status: Open Wounding Event: Gradually Appeared Comorbid History: Peripheral Venous Disease, Osteoarthritis Date Acquired: 06/02/2020 Weeks Of Treatment: 2 Clustered Wound: Yes Photos Wound Measurements Length: (cm) 12 Width: (cm) 10.5 Depth: (cm) 0.1 Clustered Quantity: 8 Area: (cm) 98.96 Volume: (cm) 9.896 % Reduction in Area: 30.8% % Reduction in Volume: 30.8% Epithelialization: Small (1-33%) Tunneling: No Undermining: No Wound Description Classification: Full Thickness Without Exposed Support Structures Wound Margin: Indistinct, nonvisible Exudate Amount: Large Exudate Type: Purulent Exudate Color: yellow, brown, green Foul Odor After Cleansing: Yes Due to Product Use: No Slough/Fibrino Yes Wound Bed Granulation Amount: Large (67-100%) Exposed Structure Granulation Quality: Pink, Pale Fascia Exposed: No Necrotic Amount: Small (1-33%) Fat Layer (Subcutaneous Tissue) Exposed: Yes Necrotic Quality: Adherent Slough Tendon Exposed: No Muscle Exposed: No Joint Exposed: No Bone Exposed: No Treatment Notes Wound #1 (Lower Leg) Wound Laterality: Right, Anterior Cleanser Wound Cleanser Discharge Instruction: Cleanse the wound with wound cleanser prior to applying a clean dressing using gauze sponges, not tissue or cotton balls. Soap and Water Discharge Instruction: May shower and wash wound with dial antibacterial soap and water prior to dressing change. Peri-Wound Care Triamcinolone 15 (g) Discharge Instruction: Apply liberally to wounds and periwound. Use triamcinolone 15 (g) as directed Sween Lotion (Moisturizing lotion) Discharge Instruction: Apply moisturizing lotion as directed Topical Triamcinolone Discharge Instruction: Apply Triamcinolone to wounds. Gentamicin Discharge Instruction: Apply under alginate Ag. As directed by physician Primary Dressing KerraCel Ag Gelling Fiber Dressing, 4x5 in (silver  alginate) Discharge Instruction: Apply silver alginate to wound bed as instructed Secondary Dressing Woven Gauze Sponge, Non-Sterile 4x4 in Discharge Instruction: Apply over primary dressing as directed. Zetuvit Plus 4x8 in Discharge Instruction: Apply over primary dressing as directed. ABD Pad, 8x10 Discharge Instruction: Apply over primary dressing as directed. Secured With Compression Wrap ThreePress (3 layer compression wrap) Discharge Instruction: Apply three layer compression as directed. Compression Stockings Add-Ons Electronic Signature(s) Signed: 10/18/2020 4:57:25 PM By: Sandre Kitty Signed: 10/18/2020 6:00:49 PM By: Deon Pilling Entered By: Sandre Kitty on 10/18/2020 16:54:01 -------------------------------------------------------------------------------- Wound Assessment Details Patient Name: Date of Service: Alben Spittle, Asa Saunas NNE T. 10/18/2020 1:45 PM Medical Record Number: [id number] Patient Account Number: [id number] Date of  Birth/Sex: Treating RN: 1929-11-04 (87 y.o. Sue Lush Primary Care Karen Kinnard: Pricilla Holm Other Clinician: Referring Tu Bayle: Treating Brendan Gadson/Extender: Charlie Pitter in Treatment: 2 Wound Status Wound Number: 2 Primary Etiology: Venous Leg Ulcer Wound Location: Left, Proximal, Posterior Lower Leg Wound Status: Healed - Epithelialized Wounding Event: Gradually Appeared Comorbid History: Peripheral Venous Disease, Osteoarthritis Date Acquired: 06/02/2020 Weeks Of Treatment: 2 Clustered Wound: No Wound Measurements Length: (cm) Width: (cm) Depth: (cm) Area: (cm) Volume: (cm) 0 % Reduction in Area: 100% 0 % Reduction in Volume: 100% 0 0 0 Wound Description Classification: Full Thickness Without Exposed Support Structur es Electronic Signature(s) Signed: 10/18/2020 5:39:50 PM By: Lorrin Jackson Entered By: Lorrin Jackson on 10/18/2020  14:34:32 -------------------------------------------------------------------------------- Wound Assessment Details Patient Name: Date of Service: Carlisle Cater NNE T. 10/18/2020 1:45 PM Medical Record Number: [id number] Patient Account Number: [id number] Date of Birth/Sex: Treating RN: 09/09/1929 (87 y.o. Sue Lush Primary Care Cyntia Staley: Pricilla Holm Other Clinician: Referring Ladina Shutters: Treating Xana Bradt/Extender: Charlie Pitter in Treatment: 2 Wound Status Wound Number: 3 Primary Etiology: Venous Leg Ulcer Wound Location: Left, Distal, Posterior Lower Leg Wound Status: Healed - Epithelialized Wounding Event: Gradually Appeared Comorbid History: Peripheral Venous Disease, Osteoarthritis Date Acquired: 06/02/2020 Weeks Of Treatment: 2 Clustered Wound: No Wound Measurements Length: (cm) 0 Width: (cm) 0 Depth: (cm) 0 Area: (cm) 0 Volume: (cm) 0 % Reduction in Area: 100% % Reduction in Volume: 100% Wound Description Classification: Full Thickness Without Exposed Support Structur es Electronic Signature(s) Signed: 10/18/2020 5:39:50 PM By: Lorrin Jackson Entered By: Lorrin Jackson on 10/18/2020 14:34:54 -------------------------------------------------------------------------------- Vitals Details Patient Name: Date of Service: Alben Spittle, SUZA NNE T. 10/18/2020 1:45 PM Medical Record Number: [id number] Patient Account Number: [id number] Date of Birth/Sex: Treating RN: Oct 29, 1929 (87 y.o. Sue Lush Primary Care Friedrich Harriott: Pricilla Holm Other Clinician: Referring Kijana Estock: Treating Gad Aymond/Extender: Charlie Pitter in Treatment: 2 Vital Signs Time Taken: 14:17 Temperature (F): 97.9 Height (in): 60 Pulse (bpm): 74 Weight (lbs): 131 Respiratory Rate (breaths/min): 18 Body Mass Index (BMI): 25.6 Blood Pressure (mmHg): 174/70 Reference Range: 80 - 120 mg / dl Electronic  Signature(s) Signed: 10/18/2020 5:39:50 PM By: Lorrin Jackson Signed: 10/18/2020 5:39:50 PM By: Lorrin Jackson Entered By: Lorrin Jackson on 10/18/2020 14:18:26

## 2020-10-24 DIAGNOSIS — H401132 Primary open-angle glaucoma, bilateral, moderate stage: Secondary | ICD-10-CM | POA: Diagnosis not present

## 2020-10-24 DIAGNOSIS — H04123 Dry eye syndrome of bilateral lacrimal glands: Secondary | ICD-10-CM | POA: Diagnosis not present

## 2020-10-25 ENCOUNTER — Encounter (HOSPITAL_BASED_OUTPATIENT_CLINIC_OR_DEPARTMENT_OTHER): Payer: Medicare PPO | Admitting: Internal Medicine

## 2020-10-25 ENCOUNTER — Other Ambulatory Visit: Payer: Self-pay

## 2020-10-25 DIAGNOSIS — L97812 Non-pressure chronic ulcer of other part of right lower leg with fat layer exposed: Secondary | ICD-10-CM | POA: Diagnosis not present

## 2020-10-25 DIAGNOSIS — I87333 Chronic venous hypertension (idiopathic) with ulcer and inflammation of bilateral lower extremity: Secondary | ICD-10-CM | POA: Diagnosis not present

## 2020-10-25 DIAGNOSIS — L97822 Non-pressure chronic ulcer of other part of left lower leg with fat layer exposed: Secondary | ICD-10-CM | POA: Diagnosis not present

## 2020-10-25 NOTE — Progress Notes (Signed)
Leah [name] ([id number]) . Visit Report for 10/25/2020 Arrival Information Details Patient Name: Date of Service: Leah [name]. 10/25/2020 1:45 PM Medical Record Number: [id number] Patient Account Number: [id number] Date of Birth/Sex: Treating RN: [birth year] (87 y.o. Leah [name] Primary Care Leah [name]: Leah [name] Other Clinician: Referring Leah [name]: Treating Leah [name]/Extender: Leah [name] in Treatment: 3 Visit Information History Since Last Visit Added or deleted any medications: No Patient Arrived: Leah [name] Any new allergies or adverse reactions: No Arrival Time: 13:57 Had a fall or experienced change in No Transfer Assistance: None activities of daily living that may affect Patient Identification Verified: Yes risk of falls: Secondary Verification Process Completed: Yes Signs or symptoms of abuse/neglect since last visito No Patient Requires Transmission-Based Precautions: No Hospitalized since last visit: No Patient Has Alerts: No Implantable device outside of the clinic excluding No cellular tissue based products placed in the center since last visit: Has Dressing in Place as Prescribed: Yes Has Compression in Place as Prescribed: Yes Pain Present Now: No Electronic Signature(s) Signed: 10/25/2020 5:35:44 PM By: Leah [name] Entered By: Leah [name] on 10/25/2020 14:00:40 -------------------------------------------------------------------------------- Compression Therapy Details Patient Name: Date of Service: Leah [name]. 10/25/2020 1:45 PM Medical Record Number: [id number] Patient Account Number: [id number] Date of Birth/Sex: Treating RN: 09-Oct-1929 (87 y.o. Leah [name] Primary Care Leah [name]: Leah [name] Other Clinician: Referring Tanyia Grabbe: Treating Leah [name]/Extender: Leah [name] in Treatment: 3 Compression Therapy Performed for Wound Assessment: Wound #1  Right,Anterior Lower Leg Performed By: Clinician Leah Jackson, RN Compression Type: Three Layer Electronic Signature(s) Signed: 10/25/2020 2:39:00 PM By: Leah [name] Entered By: Leah [name] on 10/25/2020 14:39:00 -------------------------------------------------------------------------------- Encounter Discharge Information Details Patient Name: Date of Service: Leah [name], Leah Saunas NNE [name]. 10/25/2020 1:45 PM Medical Record Number: [id number] Patient Account Number: [id number] Date of Birth/Sex: Treating RN: 1929/06/09 (87 y.o. Leah [name] Primary Care Leah [name]: Leah [name] Other Clinician: Referring Leah [name]: Treating Leah [name]/Extender: Leah [name] in Treatment: 3 Encounter Discharge Information Items Discharge Condition: Stable Ambulatory Status: [place] Discharge Destination: Home Transportation: Private Auto Schedule Follow-up Appointment: Yes Clinical Summary of Care: Provided on 10/25/2020 Form Type Recipient Paper Patient Patient Electronic Signature(s) Signed: 10/25/2020 2:39:51 PM By: Leah [name] Entered By: Leah [name] on 10/25/2020 14:39:51 -------------------------------------------------------------------------------- Patient/Caregiver Education Details Patient Name: Date of Service: Leah [name] 5/26/2022andnbsp1:45 PM Medical Record Number: [id number] Patient Account Number: [id number] Date of Birth/Gender: Treating RN: 11/14/1929 (87 y.o. Leah [name] Primary Care Physician: Leah [name] Other Clinician: Referring Physician: Treating Physician/Extender: Leah [name] in Treatment: 3 Education Assessment Education Provided To: Patient Education Topics Provided Venous: Methods: Explain/Verbal Responses: State content correctly Wound/Skin Impairment: Methods: Explain/Verbal, Printed Responses: State content correctly Electronic Signature(s) Signed:  10/25/2020 5:35:44 PM By: Leah [name] Entered By: Leah [name] on 10/25/2020 14:39:35 -------------------------------------------------------------------------------- Wound Assessment Details Patient Name: Date of Service: Leah [name]. 10/25/2020 1:45 PM Medical Record Number: [id number] Patient Account Number: [id number] Date of Birth/Sex: Treating RN: [birth year] (87 y.o. Leah [name] Primary Care Leah [name]: Leah [name] Other Clinician: Referring Leah [name]: Treating Leah [name]/Extender: Leah [name] in Treatment: 3 Wound Status Wound Number: 1 Primary Etiology: Venous Leg Ulcer Wound Location: Right, Anterior Lower Leg Wound Status: Open Wounding Event: Gradually Appeared Comorbid History: Peripheral Venous Disease, Osteoarthritis Date Acquired: 06/02/2020 Weeks Of Treatment: 3 Clustered Wound: Yes Wound Measurements Length: (cm) 10 Width: (cm) 8 Depth: (cm) 0.1 Clustered Quantity: 8 Area: (cm) 62.832 Volume: (  cm) 6.283 % Reduction in Area: 56% % Reduction in Volume: 56% Epithelialization: Medium (34-66%) Tunneling: No Undermining: No Wound Description Classification: Full Thickness Without Exposed Support Structures Wound Margin: Distinct, outline attached Exudate Amount: Medium Exudate Type: Serosanguineous Exudate Color: red, brown Foul Odor After Cleansing: No Slough/Fibrino No Wound Bed Granulation Amount: Large (67-100%) Exposed Structure Granulation Quality: Pink, Pale Fascia Exposed: No Necrotic Amount: None Present (0%) Fat Layer (Subcutaneous Tissue) Exposed: Yes Tendon Exposed: No Muscle Exposed: No Joint Exposed: No Bone Exposed: No Treatment Notes Wound #1 (Lower Leg) Wound Laterality: Right, Anterior Cleanser Wound Cleanser Discharge Instruction: Cleanse the wound with wound cleanser prior to applying a clean dressing using gauze sponges, not tissue or cotton balls. Soap and Water Discharge  Instruction: May shower and wash wound with dial antibacterial soap and water prior to dressing change. Peri-Wound Care Triamcinolone 15 (g) Discharge Instruction: Apply liberally to wounds and periwound. Use triamcinolone 15 (g) as directed Sween Lotion (Moisturizing lotion) Discharge Instruction: Apply moisturizing lotion as directed Topical Triamcinolone Discharge Instruction: Apply Triamcinolone to wounds. Gentamicin Discharge Instruction: Apply under alginate Ag. As directed by physician Primary Dressing KerraCel Ag Gelling Fiber Dressing, 4x5 in (silver alginate) Discharge Instruction: Apply silver alginate to wound bed as instructed Secondary Dressing Woven Gauze Sponge, Non-Sterile 4x4 in Discharge Instruction: Apply over primary dressing as directed. Zetuvit Plus 4x8 in Discharge Instruction: Apply over primary dressing as directed. ABD Pad, 8x10 Discharge Instruction: Apply over primary dressing as directed. Secured With Compression Wrap ThreePress (3 layer compression wrap) Discharge Instruction: Apply three layer compression as directed. Compression Stockings Add-Ons Electronic Signature(s) Signed: 10/25/2020 2:38:41 PM By: Leah [name] Entered By: Leah [name] on 10/25/2020 14:38:40 -------------------------------------------------------------------------------- Vitals Details Patient Name: Date of Service: Leah [name], Leah NNE [name]. 10/25/2020 1:45 PM Medical Record Number: [id number] Patient Account Number: [id number] Date of Birth/Sex: Treating RN: 1929/11/05 (87 y.o. Leah [name] Primary Care Caley Ciaramitaro: Leah [name] Other Clinician: Referring Fraser Busche: Treating Jesenia Spera/Extender: Leah [name] in Treatment: 3 Vital Signs Time Taken: 14:00 Temperature (F): 97.7 Height (in): 60 Pulse (bpm): 74 Weight (lbs): 131 Respiratory Rate (breaths/min): 18 Body Mass Index (BMI): 25.6 Blood Pressure (mmHg): 159/80 Reference  Range: 80 - 120 mg / dl Electronic Signature(s) Signed: 10/25/2020 5:35:44 PM By: Leah [name] Entered By: Leah [name] on 10/25/2020 14:02:23

## 2020-10-28 ENCOUNTER — Other Ambulatory Visit: Payer: Self-pay | Admitting: Gastroenterology

## 2020-10-30 ENCOUNTER — Other Ambulatory Visit: Payer: Self-pay

## 2020-10-31 ENCOUNTER — Ambulatory Visit: Payer: Medicare PPO | Admitting: Internal Medicine

## 2020-10-31 ENCOUNTER — Encounter: Payer: Self-pay | Admitting: Internal Medicine

## 2020-10-31 DIAGNOSIS — M545 Low back pain, unspecified: Secondary | ICD-10-CM

## 2020-10-31 DIAGNOSIS — M810 Age-related osteoporosis without current pathological fracture: Secondary | ICD-10-CM

## 2020-10-31 DIAGNOSIS — R609 Edema, unspecified: Secondary | ICD-10-CM | POA: Diagnosis not present

## 2020-10-31 DIAGNOSIS — R030 Elevated blood-pressure reading, without diagnosis of hypertension: Secondary | ICD-10-CM

## 2020-10-31 DIAGNOSIS — G8929 Other chronic pain: Secondary | ICD-10-CM | POA: Diagnosis not present

## 2020-10-31 NOTE — Patient Instructions (Signed)
You can stop using the calcitonin noes spray as this is done.   Keep doing the exercises and let us know if you want to do the physical therapy.

## 2020-10-31 NOTE — Progress Notes (Signed)
   Subjective:   Patient ID: Leah [name], female    DOB: 05-17-1930, 87 y.o.   MRN: [id number]  HPI The patient is an 87 YO female coming in for concerns about leg swelling, lost husband about 1 month ago (he was very sick and has mixed feelings some relief and sadness, still dealing with estate), and back pain (doing well with medications and has stopped the calcitonin nose spray, this did help and used 2 months).  Review of Systems  Constitutional: Negative.   HENT: Negative.   Eyes: Negative.   Respiratory: Negative for cough, chest tightness and shortness of breath.   Cardiovascular: Positive for leg swelling. Negative for chest pain and palpitations.  Gastrointestinal: Negative for abdominal distention, abdominal pain, constipation, diarrhea, nausea and vomiting.  Musculoskeletal: Positive for arthralgias.  Skin: Negative.   Neurological: Positive for weakness.  Psychiatric/Behavioral: Negative.     Objective:  Physical Exam Constitutional:      Appearance: She is well-developed.  HENT:     Head: Normocephalic and atraumatic.  Cardiovascular:     Rate and Rhythm: Normal rate and regular rhythm.  Pulmonary:     Effort: Pulmonary effort is normal. No respiratory distress.     Breath sounds: Normal breath sounds. No wheezing or rales.  Abdominal:     General: Bowel sounds are normal. There is no distension.     Palpations: Abdomen is soft.     Tenderness: There is no abdominal tenderness. There is no rebound.  Musculoskeletal:     Cervical back: Normal range of motion.  Skin:    General: Skin is warm and dry.     Comments: Right leg wrapped with ace, foot examined and no skin breakdown, left leg examined still with swelling but no skin breakdown  Neurological:     Mental Status: She is alert and oriented to person, place, and time.     Coordination: Coordination normal.     Vitals:   10/31/20 1040  BP: 134/82  Pulse: 82  Resp: 18  Temp: 98.4 F (36.9 C)   TempSrc: Oral  SpO2: 98%  Weight: 133 lb (60.3 kg)  Height: 5' (1.524 m)    This visit occurred during the SARS-CoV-2 public health emergency.  Safety protocols were in place, including screening questions prior to the visit, additional usage of staff PPE, and extensive cleaning of exam room while observing appropriate contact time as indicated for disinfecting solutions.   Assessment & Plan:

## 2020-11-01 ENCOUNTER — Encounter (HOSPITAL_BASED_OUTPATIENT_CLINIC_OR_DEPARTMENT_OTHER): Payer: Medicare PPO | Attending: Internal Medicine | Admitting: Internal Medicine

## 2020-11-01 ENCOUNTER — Other Ambulatory Visit: Payer: Self-pay

## 2020-11-01 DIAGNOSIS — L97818 Non-pressure chronic ulcer of other part of right lower leg with other specified severity: Secondary | ICD-10-CM | POA: Diagnosis not present

## 2020-11-01 DIAGNOSIS — I87333 Chronic venous hypertension (idiopathic) with ulcer and inflammation of bilateral lower extremity: Secondary | ICD-10-CM | POA: Insufficient documentation

## 2020-11-01 DIAGNOSIS — L97812 Non-pressure chronic ulcer of other part of right lower leg with fat layer exposed: Secondary | ICD-10-CM | POA: Diagnosis not present

## 2020-11-01 DIAGNOSIS — N183 Chronic kidney disease, stage 3 unspecified: Secondary | ICD-10-CM | POA: Insufficient documentation

## 2020-11-01 DIAGNOSIS — I872 Venous insufficiency (chronic) (peripheral): Secondary | ICD-10-CM | POA: Diagnosis not present

## 2020-11-01 NOTE — Progress Notes (Signed)
LENKA, ZHAO T ([id number]) . Visit Report for 11/01/2020 Debridement Details Patient Name: Date of Service: Carlisle Cater NNE T. 11/01/2020 3:45 PM Medical Record Number: [id number] Patient Account Number: [id number] Date of Birth/Sex: Treating RN: April 17, 1930 (87 y.o. [name], Meta.Reding Primary Care Provider: Pricilla Holm Other Clinician: Referring Provider: Treating Provider/Extender: Charlie Pitter in Treatment: 4 Debridement Performed for Assessment: Wound #1 Right,Anterior Lower Leg Performed By: Physician Ricard Dillon., MD Debridement Type: Debridement Severity of Tissue Pre Debridement: Fat layer exposed Level of Consciousness (Pre-procedure): Awake and Alert Pre-procedure Verification/Time Out Yes - 16:25 Taken: Start Time: 16:26 Pain Control: Lidocaine 4% T opical Solution T Area Debrided (L x W): otal 1.5 (cm) x 1.5 (cm) = 2.25 (cm) Tissue and other material debrided: Viable, Non-Viable, Slough, Skin: Dermis , Skin: Epidermis, Fibrin/Exudate, Slough Level: Skin/Epidermis Debridement Description: Selective/Open Wound Instrument: Curette Bleeding: Minimum Hemostasis Achieved: Pressure End Time: 16:30 Procedural Pain: 0 Post Procedural Pain: 0 Response to Treatment: Procedure was tolerated well Level of Consciousness (Post- Awake and Alert procedure): Post Debridement Measurements of Total Wound Length: (cm) 5.5 Width: (cm) 4.5 Depth: (cm) 0.1 Volume: (cm) 1.944 Character of Wound/Ulcer Post Debridement: Requires Further Debridement Severity of Tissue Post Debridement: Fat layer exposed Post Procedure Diagnosis Same as Pre-procedure Electronic Signature(s) Signed: 11/01/2020 4:50:31 PM By: Linton Ham MD Signed: 11/01/2020 5:16:10 PM By: Deon Pilling Entered By: Linton Ham on 11/01/2020 16:48:34 -------------------------------------------------------------------------------- HPI Details Patient Name: Date of  Service: Carlisle Cater NNE T. 11/01/2020 3:45 PM Medical Record Number: [id number] Patient Account Number: [id number] Date of Birth/Sex: Treating RN: 04/13/1930 (87 y.o. [name] Primary Care Provider: Pricilla Holm Other Clinician: Referring Provider: Treating Provider/Extender: Charlie Pitter in Treatment: 4 History of Present Illness HPI Description: ADMISSION 10/04/2020; This is an 87 year old woman who lives independently. Her husband was actually a longstanding patient in this clinic who passed away recently. She states her problem began in January with increased swelling in her legs she has a large area on the right anterior lower leg and 3 smaller areas on the left. She has a history of chronic venous insufficiency. I note that she had DVT studies on 03/29/2020 that were negative for DVT She was in her primary doctor's office late . in April and she was given Silvadene cream but she does not feel this has been helping. Recently she has been using Vaseline she has compression stockings but does not wear them reliably including juxta lites which belonged to her husband Past medical history includes chronic venous insufficiency, stage III chronic kidney disease, sciatica, peripheral edema, pelvic fractures ABIs in our clinic were normal 1.13 on the right and 1.06 on the left 5/12; arrives in clinic today with only 1 wound left on the left leg which is the posterior left calf. Still an extensive area on the right anterior lower leg. Our intake nurse reports malodorous purulent looking drainage. We have been using silver alginate under compression 3 layer 5/19 the left posterior calf is closed. Again I get description of this of the right anterior leg is having a lot of drainage and malodor although she only appears to have a cluster of small wounds which really do not appear to be infected. The PCR culture I did last week showed Pseudomonas and staph  aureus. I put this forward to [facility] although I think I am just going to use gentamicin on this under compression. I gave her Keflex last week but even the  staff was likely to be MRSA. 6/2; left posterior calf is closed right anterior leg has 3 or 4 small scattered wounds. I used a #3 curette to clean up the surfaces of these. We have been using gentamicin I do not think that is going to be necessary. Electronic Signature(s) Signed: 11/01/2020 4:50:31 PM By: Linton Ham MD Entered By: Linton Ham on 11/01/2020 16:47:25 -------------------------------------------------------------------------------- Physical Exam Details Patient Name: Date of Service: Carlisle Cater NNE T. 11/01/2020 3:45 PM Medical Record Number: [id number] Patient Account Number: [id number] Date of Birth/Sex: Treating RN: 10-20-1929 (87 y.o. [name] Primary Care Provider: Pricilla Holm Other Clinician: Referring Provider: Treating Provider/Extender: Charlie Pitter in Treatment: 4 Constitutional Patient is hypertensive.. Pulse regular and within target range for patient.Marland Kitchen Respirations regular, non-labored and within target range.. Temperature is normal and within the target range for the patient.Marland Kitchen Appears in no distress. Notes Wound exam; left posterior calf is closed. She continues to have an collection of small open wounds anteriorly. I used a #3 curette to remove slough eschar from the wound surfaces here. Most of them are quite a bit improved. Electronic Signature(s) Signed: 11/01/2020 4:50:31 PM By: Linton Ham MD Entered By: Linton Ham on 11/01/2020 16:49:23 -------------------------------------------------------------------------------- Physician Orders Details Patient Name: Date of Service: Carlisle Cater NNE T. 11/01/2020 3:45 PM Medical Record Number: [id number] Patient Account Number: [id number] Date of Birth/Sex: Treating RN: 03-Mar-1930 (87 y.o. 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 - Dr. Dellia Nims Return A Bathing/ Shower/ Hygiene May shower with protection but do not get wound dressing(s) wet. - use a cast protector. Edema Control - Lymphedema / SCD / Other Elevate legs to the level of the heart or above for 30 minutes daily and/or when sitting, a frequency of: - throughout the day. Avoid standing for long periods of time. Exercise regularly Moisturize legs daily. - apply lotion every night before bed to left leg. Compression stocking or Garment 20-30 mm/Hg pressure to: - Left leg apply Jobst compression stocking. Apply in the morning and remove at night. Additional Orders / Instructions Follow Nutritious Diet Wound Treatment Wound #1 - Lower Leg Wound Laterality: Right, Anterior Cleanser: Soap and Water 1 x Per Week Discharge Instructions: May shower and wash wound with dial antibacterial soap and water prior to dressing change. Cleanser: Wound Cleanser 1 x Per Week Discharge Instructions: Cleanse the wound with wound cleanser prior to applying a clean dressing using gauze sponges, not tissue or cotton balls. Peri-Wound Care: Triamcinolone 15 (g) 1 x Per Week Discharge Instructions: Apply liberally to wounds and periwound. Use triamcinolone 15 (g) as directed Peri-Wound Care: Sween Lotion (Moisturizing lotion) 1 x Per Week Discharge Instructions: Apply moisturizing lotion as directed Topical: Triamcinolone 1 x Per Week Discharge Instructions: Apply Triamcinolone to  wounds. Prim Dressing: Hydrofera Blue Classic Foam, 4x4 in 1 x Per Week ary Discharge Instructions: Moisten with saline prior to applying to wound bed Secondary Dressing: Woven Gauze Sponge, Non-Sterile 4x4 in 1 x Per Week Discharge Instructions: Apply over primary dressing as directed. Secondary Dressing: ABD Pad, 8x10 1 x Per Week Discharge Instructions: Apply over primary dressing as  directed. Compression Wrap: ThreePress (3 layer compression wrap) 1 x Per Week Discharge Instructions: Apply three layer compression as directed. Electronic Signature(s) Signed: 11/01/2020 4:50:31 PM By: Linton Ham MD Signed: 11/01/2020 5:16:10 PM By: Deon Pilling Entered By: Deon Pilling on 11/01/2020 16:31:54 -------------------------------------------------------------------------------- Problem List Details Patient Name: Date of Service: Alben Spittle, Asa Saunas NNE T. 11/01/2020 3:45 PM Medical Record Number: [id number] Patient Account Number: [id number] Date of Birth/Sex: Treating RN: Mar 13, 1930 (87 y.o. 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 11/01/2020 3:45 PM Medical Record Number: [id number] Patient Account Number: [id number] Date of Birth/Sex: Treating RN: [birth year] (87 y.o. [name] Primary Care Provider: Pricilla Holm Other Clinician: Referring Provider: Treating Provider/Extender: Charlie Pitter in Treatment: 4 Subjective History of Present Illness (HPI) ADMISSION 10/04/2020; This is an 87 year old woman who lives independently. Her husband was actually a longstanding patient in this clinic who passed away recently. She states her problem began in January with increased swelling in her legs she has a large area on the right anterior lower leg and 3 smaller areas on the left. She has a history of chronic venous insufficiency. I note that she had DVT studies on 03/29/2020 that were negative for DVT She was in her primary doctor's office late . in April and she was given Silvadene cream but she does not feel this has been helping. Recently she has been using Vaseline she has compression stockings but does not wear them reliably including juxta lites which belonged to her husband Past medical history includes chronic venous insufficiency, stage III chronic kidney disease, sciatica, peripheral edema, pelvic fractures ABIs in our clinic were normal 1.13 on the right and 1.06 on the left 5/12; arrives in clinic today with only 1 wound left on the left leg which is the posterior left calf. Still an extensive area on the right anterior lower leg. Our intake nurse reports malodorous purulent looking drainage. We have been using silver alginate under compression 3 layer 5/19 the left posterior calf is closed. Again I get description of this of the right anterior leg is having a lot of drainage and malodor although she only appears  to have a cluster of small wounds which really do not appear to be infected. The PCR culture I did last week showed Pseudomonas and staph aureus. I put this forward to [facility] although I think I am just going to use gentamicin on this under compression. I gave her Keflex last week but even the staff was likely to be MRSA. 6/2; left posterior calf is closed right anterior leg has 3 or 4 small scattered wounds. I used a #3 curette to clean up the surfaces of these. We have been using gentamicin I do not think that is going to be  necessary. Objective Constitutional Vitals Time Taken: 4:12 PM, Height: 60 in, Weight: 131 lbs, BMI: 25.6, Temperature: 98.7 F, Pulse: 82 bpm, Respiratory Rate: 18 breaths/min, Blood Pressure: 175/71 mmHg. Integumentary (Hair, Skin) Wound #1 status is Open. Original cause of wound was Gradually Appeared. The date acquired was: 06/02/2020. The wound has been in treatment 4 weeks. The wound is located on the Right,Anterior Lower Leg. The wound measures 5.5cm length x 4.5cm width x 0.1cm depth; 19.439cm^2 area and 1.944cm^3 volume. There is Fat Layer (Subcutaneous Tissue) exposed. There is no tunneling or undermining noted. There is a medium amount of serosanguineous drainage noted. The wound margin is distinct with the outline attached to the wound base. There is large (67-100%) pink, pale granulation within the wound bed. There is a small (1-33%) amount of necrotic tissue within the wound bed including Adherent Slough. Assessment Active Problems ICD-10 Chronic venous hypertension (idiopathic) with ulcer and inflammation of bilateral lower extremity Non-pressure chronic ulcer of other part of right lower leg with other specified severity Procedures Wound #1 Pre-procedure diagnosis of Wound #1 is a Venous Leg Ulcer located on the Right,Anterior Lower Leg .Severity of Tissue Pre Debridement is: Fat layer exposed. There was a Selective/Open Wound Skin/Epidermis Debridement  with a total area of 2.25 sq cm performed by Ricard Dillon., MD. With the following instrument(s): Curette to remove Viable and Non-Viable tissue/material. Material removed includes Slough, Skin: Dermis, Skin: Epidermis, and Fibrin/Exudate after achieving pain control using Lidocaine 4% Topical Solution. A time out was conducted at 16:25, prior to the start of the procedure. A Minimum amount of bleeding was controlled with Pressure. The procedure was tolerated well with a pain level of 0 throughout and a pain level of 0 following the procedure. Post Debridement Measurements: 5.5cm length x 4.5cm width x 0.1cm depth; 1.944cm^3 volume. Character of Wound/Ulcer Post Debridement requires further debridement. Severity of Tissue Post Debridement is: Fat layer exposed. Post procedure Diagnosis Wound #1: Same as Pre-Procedure Pre-procedure diagnosis of Wound #1 is a Venous Leg Ulcer located on the Right,Anterior Lower Leg . There was a Three Layer Compression Therapy Procedure by Deon Pilling, RN. Post procedure Diagnosis Wound #1: Same as Pre-Procedure Plan Follow-up Appointments: Return Appointment in 1 week. - Dr. Dellia Nims Bathing/ Shower/ Hygiene: May shower with protection but do not get wound dressing(s) wet. - use a cast protector. Edema Control - Lymphedema / SCD / Other: Elevate legs to the level of the heart or above for 30 minutes daily and/or when sitting, a frequency of: - throughout the day. Avoid standing for long periods of time. Exercise regularly Moisturize legs daily. - apply lotion every night before bed to left leg. Compression stocking or Garment 20-30 mm/Hg pressure to: - Left leg apply Jobst compression stocking. Apply in the morning and remove at night. Additional Orders / Instructions: Follow Nutritious Diet WOUND #1: - Lower Leg Wound Laterality: Right, Anterior Cleanser: Soap and Water 1 x Per Week/ Discharge Instructions: May shower and wash wound with dial  antibacterial soap and water prior to dressing change. Cleanser: Wound Cleanser 1 x Per Week/ Discharge Instructions: Cleanse the wound with wound cleanser prior to applying a clean dressing using gauze sponges, not tissue or cotton balls. Peri-Wound Care: Triamcinolone 15 (g) 1 x Per Week/ Discharge Instructions: Apply liberally to wounds and periwound. Use triamcinolone 15 (g) as directed Peri-Wound Care: Sween Lotion (Moisturizing lotion) 1 x Per Week/ Discharge Instructions: Apply moisturizing lotion as directed Topical: Triamcinolone 1 x Per Week/ Discharge  Instructions: Apply Triamcinolone to wounds. Prim Dressing: Hydrofera Blue Classic Foam, 4x4 in 1 x Per Week/ ary Discharge Instructions: Moisten with saline prior to applying to wound bed Secondary Dressing: Woven Gauze Sponge, Non-Sterile 4x4 in 1 x Per Week/ Discharge Instructions: Apply over primary dressing as directed. Secondary Dressing: ABD Pad, 8x10 1 x Per Week/ Discharge Instructions: Apply over primary dressing as directed. Com pression Wrap: ThreePress (3 layer compression wrap) 1 x Per Week/ Discharge Instructions: Apply three layer compression as directed. 1. Hydrofera Blue. I do not think that gentamicin is necessary 2. TCA's/ABDs/3 layer compression Electronic Signature(s) Signed: 11/01/2020 4:50:31 PM By: Linton Ham MD Entered By: Linton Ham on 11/01/2020 16:48:17 -------------------------------------------------------------------------------- SuperBill Details Patient Name: Date of Service: Alben Spittle, SUZA NNE T. 11/01/2020 Medical Record Number: [id number] Patient Account Number: [id number] Date of Birth/Sex: Treating RN: Jul 06, 1929 (87 y.o. [name] Primary Care Provider: Pricilla Holm Other Clinician: Referring Provider: Treating Provider/Extender: Charlie Pitter in Treatment: 4 Diagnosis Coding ICD-10 Codes Code Description [phone] Chronic venous  hypertension (idiopathic) with ulcer and inflammation of bilateral lower extremity L97.818 Non-pressure chronic ulcer of other part of right lower leg with other specified severity Facility Procedures CPT4 Code: [id number] Description: [phone] - DEBRIDE WOUND 1ST 20 SQ CM OR < ICD-10 Diagnosis Description L97.818 Non-pressure chronic ulcer of other part of right lower leg with other specified s Modifier: everity Quantity: 1 Physician Procedures : CPT4 Code Description Modifier [phone] [id number] - WC PHYS DEBR WO ANESTH 20 SQ CM ICD-10 Diagnosis Description L97.818 Non-pressure chronic ulcer of other part of right lower leg with other specified severity Quantity: 1 Electronic Signature(s) Signed: 11/01/2020 4:50:31 PM By: Linton Ham MD Entered By: Linton Ham on 11/01/2020 16:49:43

## 2020-11-01 NOTE — Assessment & Plan Note (Signed)
Much improved overall. Doing prolia. Using voltaren BID which helps. Stop calcitonin nasal spray as she has used 2 months and that was our plan.

## 2020-11-01 NOTE — Assessment & Plan Note (Signed)
Doing prolia and will stop calcitonin nasal spray as she has used effectively for 2 months with significant reduction in pain from recent fracture.

## 2020-11-01 NOTE — Assessment & Plan Note (Signed)
We did talk about [place] to evaluate venous structure for interveneable lesions and she does not wish to pursue today. Wants to wait until she is done with wound center and will let us know if she changes her mind sooner.

## 2020-11-01 NOTE — Assessment & Plan Note (Signed)
BP normal today, will monitor closely as she is on NSAID for pain regularly.

## 2020-11-02 NOTE — Progress Notes (Signed)
ALICIANA, RICCIARDI T ([id number]) . Visit Report for 11/01/2020 Arrival Information Details Patient Name: Date of Service: Carlisle Cater NNE T. 11/01/2020 3:45 PM Medical Record Number: [id number] Patient Account Number: [id number] Date of Birth/Sex: Treating RN: 1929-07-28 (87 y.o. Sue Lush Primary Care Daison Braxton: Pricilla Holm Other Clinician: Referring Neil Brickell: Treating Krzysztof Reichelt/Extender: Charlie Pitter in Treatment: 4 Visit Information History Since Last Visit Added or deleted any medications: No Patient Arrived: Kasandra Knudsen Any new allergies or adverse reactions: No Arrival Time: 16:11 Had a fall or experienced change in No Transfer Assistance: None activities of daily living that may affect Patient Identification Verified: Yes risk of falls: Secondary Verification Process Completed: Yes Signs or symptoms of abuse/neglect since last visito No Patient Requires Transmission-Based Precautions: No Hospitalized since last visit: No Patient Has Alerts: No Implantable device outside of the clinic excluding No cellular tissue based products placed in the center since last visit: Has Dressing in Place as Prescribed: Yes Has Compression in Place as Prescribed: Yes Pain Present Now: No Electronic Signature(s) Signed: 11/01/2020 5:54:05 PM By: Lorrin Jackson Entered By: Lorrin Jackson on 11/01/2020 16:12:06 -------------------------------------------------------------------------------- Compression Therapy Details Patient Name: Date of Service: Carlisle Cater NNE T. 11/01/2020 3:45 PM Medical Record Number: [id number] Patient Account Number: [id number] Date of Birth/Sex: Treating RN: December 22, 1929 (87 y.o. [name] Primary Care Meena Barrantes: Pricilla Holm Other Clinician: Referring Jeanpierre Thebeau: Treating Maliah Pyles/Extender: Charlie Pitter in Treatment: 4 Compression Therapy Performed for Wound Assessment: Wound #1  Right,Anterior Lower Leg Performed By: Clinician Deon Pilling, RN Compression Type: Three Layer Post Procedure Diagnosis Same as Pre-procedure Electronic Signature(s) Signed: 11/01/2020 5:16:10 PM By: Deon Pilling Entered By: Deon Pilling on 11/01/2020 16:29:53 -------------------------------------------------------------------------------- Encounter Discharge Information Details Patient Name: Date of Service: Alben Spittle, Asa Saunas NNE T. 11/01/2020 3:45 PM Medical Record Number: [id number] Patient Account Number: [id number] Date of Birth/Sex: Treating RN: 05-Jun-1929 (87 y.o. [name] Primary Care Krystalyn Kubota: Pricilla Holm Other Clinician: Referring Kenishia Plack: Treating Moses Odoherty/Extender: Charlie Pitter in Treatment: 4 Encounter Discharge Information Items Post Procedure Vitals Discharge Condition: Stable Temperature (F): 98.7 Ambulatory Status: Cane Pulse (bpm): 82 Discharge Destination: Home Respiratory Rate (breaths/min): 18 Transportation: Private Auto Blood Pressure (mmHg): 175/71 Accompanied By: self Schedule Follow-up Appointment: Yes Clinical Summary of Care: Electronic Signature(s) Signed: 11/01/2020 5:16:10 PM By: Deon Pilling Entered By: Deon Pilling on 11/01/2020 17:02:57 -------------------------------------------------------------------------------- Lower Extremity Assessment Details Patient Name: Date of Service: Carlisle Cater NNE T. 11/01/2020 3:45 PM Medical Record Number: [id number] Patient Account Number: [id number] Date of Birth/Sex: Treating RN: May 23, 1930 (87 y.o. Sue Lush Primary Care Aashka Salomone: Pricilla Holm Other Clinician: Referring Joniel Graumann: Treating Samanvi Cuccia/Extender: Charlie Pitter in Treatment: 4 Edema Assessment Assessed: Shirlyn Goltz: No] Patrice Paradise: Yes] Edema: [Left: Ye] [Right: s] Calf Left: Right: Point of Measurement: 29 cm From Medial Instep 32 cm Ankle Left:  Right: Point of Measurement: 10 cm From Medial Instep 22 cm Vascular Assessment Pulses: Dorsalis Pedis Palpable: [Right:Yes] Electronic Signature(s) Signed: 11/01/2020 5:54:05 PM By: Lorrin Jackson Entered By: Lorrin Jackson on 11/01/2020 16:17:39 -------------------------------------------------------------------------------- Multi Wound Chart Details Patient Name: Date of Service: Alben Spittle, Asa Saunas NNE T. 11/01/2020 3:45 PM Medical Record Number: [id number] Patient Account Number: [id number] Date of Birth/Sex: Treating RN: [birth year] (87 y.o. [name] Primary Care Arieliz Latino: Pricilla Holm Other Clinician: Referring Ferris Fielden: Treating Khy Pitre/Extender: Charlie Pitter in Treatment: 4 Vital Signs Height(in): 60 Pulse(bpm): 82 Weight(lbs): 131 Blood Pressure(mmHg): 175/71 Body Mass Index(BMI): 26 Temperature(F): 98.7  Respiratory Rate(breaths/min): 18 Photos: [1:No Photos Right, Anterior Lower Leg] [N/A:N/A N/A] Wound Location: [1:Gradually Appeared] [N/A:N/A] Wounding Event: [1:Venous Leg Ulcer] [N/A:N/A] Primary Etiology: [1:Peripheral Venous Disease,] [N/A:N/A] Comorbid History: [1:Osteoarthritis 06/02/2020] [N/A:N/A] Date Acquired: [1:4] [N/A:N/A] Weeks of Treatment: [1:Open] [N/A:N/A] Wound Status: [1:Yes] [N/A:N/A] Clustered Wound: [1:8] [N/A:N/A] Clustered Quantity: [1:5.5x4.5x0.1] [N/A:N/A] Measurements L x W x D (cm) [1:19.439] [N/A:N/A] A (cm) : rea [1:1.944] [N/A:N/A] Volume (cm) : [1:86.40%] [N/A:N/A] % Reduction in A [1:rea: 86.40%] [N/A:N/A] % Reduction in Volume: [1:Full Thickness Without Exposed] [N/A:N/A] Classification: [1:Support Structures Medium] [N/A:N/A] Exudate A mount: [1:Serosanguineous] [N/A:N/A] Exudate Type: [1:red, brown] [N/A:N/A] Exudate Color: [1:Distinct, outline attached] [N/A:N/A] Wound Margin: [1:Large (67-100%)] [N/A:N/A] Granulation A mount: [1:Pink, Pale] [N/A:N/A] Granulation Quality:  [1:Small (1-33%)] [N/A:N/A] Necrotic A mount: [1:Fat Layer (Subcutaneous Tissue): Yes N/A] Exposed Structures: [1:Fascia: No Tendon: No Muscle: No Joint: No Bone: No Large (67-100%)] [N/A:N/A] Epithelialization: [1:Debridement - Selective/Open Wound N/A] Debridement: Pre-procedure Verification/Time Out 16:25 [N/A:N/A] Taken: [1:Lidocaine 4% Topical Solution] [N/A:N/A] Pain Control: [1:Slough] [N/A:N/A] Tissue Debrided: [1:Skin/Epidermis] [N/A:N/A] Level: [1:2.25] [N/A:N/A] Debridement A (sq cm): [1:rea Curette] [N/A:N/A] Instrument: [1:Minimum] [N/A:N/A] Bleeding: [1:Pressure] [N/A:N/A] Hemostasis A chieved: [1:0] [N/A:N/A] Procedural Pain: [1:0] [N/A:N/A] Post Procedural Pain: [1:Procedure was tolerated well] [N/A:N/A] Debridement Treatment Response: [1:5.5x4.5x0.1] [N/A:N/A] Post Debridement Measurements L x W x D (cm) [1:1.944] [N/A:N/A] Post Debridement Volume: (cm) [1:Compression Therapy] [N/A:N/A] Procedures Performed: [1:Debridement] Treatment Notes Electronic Signature(s) Signed: 11/01/2020 4:50:31 PM By: Linton Ham MD Signed: 11/01/2020 5:16:10 PM By: Deon Pilling Entered By: Linton Ham on 11/01/2020 16:46:31 -------------------------------------------------------------------------------- Multi-Disciplinary Care Plan Details Patient Name: Date of Service: Alben Spittle, Asa Saunas NNE T. 11/01/2020 3:45 PM Medical Record Number: [id number] Patient Account Number: [id number] Date of Birth/Sex: Treating RN: [birth year] (87 y.o. [name], [name] Primary Care Verle Brillhart: Pricilla Holm Other Clinician: Referring Juanice Warburton: Treating Phelix Fudala/Extender: Charlie Pitter in Treatment: 4 Active Inactive Abuse / Safety / Falls / Self Care Management Nursing Diagnoses: Potential for falls Potential for injury related to falls Goals: Patient will remain injury free related to falls Date Initiated: 10/04/2020 Target Resolution Date: 12/07/2020 Goal  Status: Active Patient/caregiver will verbalize understanding of skin care regimen Date Initiated: 10/04/2020 Target Resolution Date: 12/07/2020 Goal Status: Active Interventions: Assess fall risk on admission and as needed Assess: immobility, friction, shearing, incontinence upon admission and as needed Provide education on fall prevention Notes: Electronic Signature(s) Signed: 11/01/2020 5:16:10 PM By: Deon Pilling Entered By: Deon Pilling on 11/01/2020 16:26:18 -------------------------------------------------------------------------------- Pain Assessment Details Patient Name: Date of Service: Carlisle Cater NNE T. 11/01/2020 3:45 PM Medical Record Number: [id number] Patient Account Number: [id number] Date of Birth/Sex: Treating RN: 1930-05-18 (87 y.o. Sue Lush Primary Care Kenner Lewan: Pricilla Holm Other Clinician: Referring Verley Pariseau: Treating Taiz Bickle/Extender: Charlie Pitter in Treatment: 4 Active Problems Location of Pain Severity and Description of Pain Patient Has Paino No Site Locations Pain Management and Medication Current Pain Management: Electronic Signature(s) Signed: 11/01/2020 5:54:05 PM By: Lorrin Jackson Entered By: Lorrin Jackson on 11/01/2020 16:14:24 -------------------------------------------------------------------------------- Patient/Caregiver Education Details Patient Name: Date of Service: Sheliah Hatch 6/2/2022andnbsp3:45 PM Medical Record Number: [id number] Patient Account Number: [id number] Date of Birth/Gender: Treating RN: 1929-10-08 (87 y.o. [name] Primary Care Physician: Pricilla Holm Other Clinician: Referring Physician: Treating Physician/Extender: Charlie Pitter in Treatment: 4 Education Assessment Education Provided To: Patient Education Topics Provided Safety: Handouts: Safety Methods: Explain/Verbal Responses: Reinforcements  needed Electronic Signature(s) Signed: 11/01/2020 5:16:10 PM By: Deon Pilling Entered By: Deon Pilling  on 11/01/2020 16:26:32 -------------------------------------------------------------------------------- Wound Assessment Details Patient Name: Date of Service: Carlisle Cater NNE T. 11/01/2020 3:45 PM Medical Record Number: [id number] Patient Account Number: [id number] Date of Birth/Sex: Treating RN: 10-Dec-1929 (87 y.o. Sue Lush Primary Care Kristia Jupiter: Pricilla Holm Other Clinician: Referring Estuardo Frisbee: Treating Andreu Drudge/Extender: Charlie Pitter in Treatment: 4 Wound Status Wound Number: 1 Primary Etiology: Venous Leg Ulcer Wound Location: Right, Anterior Lower Leg Wound Status: Open Wounding Event: Gradually Appeared Comorbid History: Peripheral Venous Disease, Osteoarthritis Date Acquired: 06/02/2020 Weeks Of Treatment: 4 Clustered Wound: Yes Photos Wound Measurements Length: (cm) 5.5 Width: (cm) 4.5 Depth: (cm) 0.1 Clustered Quantity: 8 Area: (cm) 19.439 Volume: (cm) 1.944 % Reduction in Area: 86.4% % Reduction in Volume: 86.4% Epithelialization: Large (67-100%) Tunneling: No Undermining: No Wound Description Classification: Full Thickness Without Exposed Support Structures Wound Margin: Distinct, outline attached Exudate Amount: Medium Exudate Type: Serosanguineous Exudate Color: red, brown Foul Odor After Cleansing: No Slough/Fibrino Yes Wound Bed Granulation Amount: Large (67-100%) Exposed Structure Granulation Quality: Pink, Pale Fascia Exposed: No Necrotic Amount: Small (1-33%) Fat Layer (Subcutaneous Tissue) Exposed: Yes Necrotic Quality: Adherent Slough Tendon Exposed: No Muscle Exposed: No Joint Exposed: No Bone Exposed: No Treatment Notes Wound #1 (Lower Leg) Wound Laterality: Right, Anterior Cleanser Wound Cleanser Discharge Instruction: Cleanse the wound with wound cleanser prior to applying a clean  dressing using gauze sponges, not tissue or cotton balls. Soap and Water Discharge Instruction: May shower and wash wound with dial antibacterial soap and water prior to dressing change. Peri-Wound Care Triamcinolone 15 (g) Discharge Instruction: Apply liberally to wounds and periwound. Use triamcinolone 15 (g) as directed Sween Lotion (Moisturizing lotion) Discharge Instruction: Apply moisturizing lotion as directed Topical Triamcinolone Discharge Instruction: Apply Triamcinolone to wounds. Primary Dressing Hydrofera Blue Classic Foam, 4x4 in Discharge Instruction: Moisten with saline prior to applying to wound bed Secondary Dressing Woven Gauze Sponge, Non-Sterile 4x4 in Discharge Instruction: Apply over primary dressing as directed. ABD Pad, 8x10 Discharge Instruction: Apply over primary dressing as directed. Secured With Compression Wrap ThreePress (3 layer compression wrap) Discharge Instruction: Apply three layer compression as directed. Compression Stockings Add-Ons Electronic Signature(s) Signed: 11/01/2020 5:54:05 PM By: Lorrin Jackson Signed: 11/02/2020 5:48:41 PM By: Sandre Kitty Entered By: Sandre Kitty on 11/01/2020 16:58:51 -------------------------------------------------------------------------------- Vitals Details Patient Name: Date of Service: Alben Spittle, SUZA NNE T. 11/01/2020 3:45 PM Medical Record Number: [id number] Patient Account Number: [id number] Date of Birth/Sex: Treating RN: 01-Jul-1929 (87 y.o. Sue Lush Primary Care Daray Polgar: Pricilla Holm Other Clinician: Referring Zaylah Blecha: Treating Lexys Milliner/Extender: Charlie Pitter in Treatment: 4 Vital Signs Time Taken: 16:12 Temperature (F): 98.7 Height (in): 60 Pulse (bpm): 82 Weight (lbs): 131 Respiratory Rate (breaths/min): 18 Body Mass Index (BMI): 25.6 Blood Pressure (mmHg): 175/71 Reference Range: 80 - 120 mg / dl Electronic Signature(s) Signed:  11/01/2020 5:54:05 PM By: Lorrin Jackson Entered By: Lorrin Jackson on 11/01/2020 16:14:17

## 2020-11-08 ENCOUNTER — Other Ambulatory Visit: Payer: Self-pay

## 2020-11-08 ENCOUNTER — Encounter (HOSPITAL_BASED_OUTPATIENT_CLINIC_OR_DEPARTMENT_OTHER): Payer: Medicare PPO | Admitting: Internal Medicine

## 2020-11-08 DIAGNOSIS — L97818 Non-pressure chronic ulcer of other part of right lower leg with other specified severity: Secondary | ICD-10-CM | POA: Diagnosis not present

## 2020-11-08 DIAGNOSIS — N183 Chronic kidney disease, stage 3 unspecified: Secondary | ICD-10-CM | POA: Diagnosis not present

## 2020-11-08 DIAGNOSIS — I87331 Chronic venous hypertension (idiopathic) with ulcer and inflammation of right lower extremity: Secondary | ICD-10-CM | POA: Diagnosis not present

## 2020-11-08 DIAGNOSIS — L97812 Non-pressure chronic ulcer of other part of right lower leg with fat layer exposed: Secondary | ICD-10-CM | POA: Diagnosis not present

## 2020-11-08 DIAGNOSIS — I87333 Chronic venous hypertension (idiopathic) with ulcer and inflammation of bilateral lower extremity: Secondary | ICD-10-CM | POA: Diagnosis not present

## 2020-11-08 DIAGNOSIS — I872 Venous insufficiency (chronic) (peripheral): Secondary | ICD-10-CM | POA: Diagnosis not present

## 2020-11-08 NOTE — Progress Notes (Signed)
ELVETA, RAPE T ([id number]) . Visit Report for 11/08/2020 HPI Details Patient Name: Date of Service: Leah Cater NNE T. 11/08/2020 3:15 PM Medical Record Number: [id number] Patient Account Number: [id number] Date of Birth/Sex: Treating RN: [birth year] (87 y.o. Leah [name] Primary Care Provider: Pricilla Holm Other Clinician: Referring Provider: Treating Provider/Extender: Charlie Pitter in Treatment: 5 History of Present Illness HPI Description: ADMISSION 10/04/2020; This is an 87 year old woman who lives independently. Her husband was actually a longstanding patient in this clinic who passed away recently. She states her problem began in January with increased swelling in her legs she has a large area on the right anterior lower leg and 3 smaller areas on the left. She has a history of chronic venous insufficiency. I note that she had DVT studies on 03/29/2020 that were negative for DVT She was in her primary doctor's office late . in April and she was given Silvadene cream but she does not feel this has been helping. Recently she has been using Vaseline she has compression stockings but does not wear them reliably including juxta lites which belonged to her husband Past medical history includes chronic venous insufficiency, stage III chronic kidney disease, sciatica, peripheral edema, pelvic fractures ABIs in our clinic were normal 1.13 on the right and 1.06 on the left 5/12; arrives in clinic today with only 1 wound left on the left leg which is the posterior left calf. Still an extensive area on the right anterior lower leg. Our intake nurse reports malodorous purulent looking drainage. We have been using silver alginate under compression 3 layer 5/19 the left posterior calf is closed. Again I get description of this of the right anterior leg is having a lot of drainage and malodor although she only appears to have a cluster of small wounds which  really do not appear to be infected. The PCR culture I did last week showed Pseudomonas and staph aureus. I put this forward to [facility] although I think I am just going to use gentamicin on this under compression. I gave her Keflex last week but even the staff was likely to be MRSA. 6/2; left posterior calf is closed right anterior leg has 3 or 4 small scattered wounds. I used a #3 curette to clean up the surfaces of these. We have been using gentamicin I do not think that is going to be necessary. 6/9; her left posterior calf is remain closed and she has her stocking on. On the right anterior we have 2 open areas that I think are mostly epithelialized although they look vulnerable. For this reason I look towards putting her on another week of compression. She should be done by next week. We have been using Hydrofera Blue on the wound areas Electronic Signature(s) Signed: 11/08/2020 4:38:36 PM By: Linton Ham MD Entered By: Linton Ham on 11/08/2020 15:48:03 -------------------------------------------------------------------------------- Physical Exam Details Patient Name: Date of Service: Leah Cater NNE T. 11/08/2020 3:15 PM Medical Record Number: [id number] Patient Account Number: [id number] Date of Birth/Sex: Treating RN: Nov 13, 1929 (87 y.o. Leah [name] Primary Care Provider: Pricilla Holm Other Clinician: Referring Provider: Treating Provider/Extender: Charlie Pitter in Treatment: 5 Constitutional Patient is hypertensive.. Pulse regular and within target range for patient.Marland Kitchen Respirations regular, non-labored and within target range.. Temperature is normal and within the target range for the patient.Marland Kitchen Appears in no distress. Cardiovascular Pedal pulses are palpable. Edema control is excellent. Notes Wound exam; left posterior calf is closed although I did  not look at this. She continues to have 2 small open areas on the right leg I think  both of these are epithelialized but I think they are vulnerable. There is no evidence of surrounding infection Electronic Signature(s) Signed: 11/08/2020 4:38:36 PM By: Linton Ham MD Entered By: Linton Ham on 11/08/2020 15:49:23 -------------------------------------------------------------------------------- Physician Orders Details Patient Name: Date of Service: Leah Cater NNE T. 11/08/2020 3:15 PM Medical Record Number: [id number] Patient Account Number: [id number] Date of Birth/Sex: Treating RN: 04-24-1930 (87 y.o. Leah [name], Leah [name] Primary Care Provider: Pricilla Holm Other Clinician: Referring Provider: Treating Provider/Extender: Charlie Pitter in Treatment: 5 Verbal / Phone Orders: No Diagnosis Coding ICD-10 Coding Code Description [id number] Chronic venous hypertension (idiopathic) with ulcer and inflammation of bilateral lower extremity L97.818 Non-pressure chronic ulcer of other part of right lower leg with other specified severity Follow-up Appointments ppointment in 1 week. - Dr. Dellia Nims Return A Bathing/ Shower/ Hygiene May shower with protection but do not get wound dressing(s) wet. - use a cast protector. Edema Control - Lymphedema / SCD / Other Elevate legs to the level of the heart or above for 30 minutes daily and/or when sitting, a frequency of: - throughout the day. Avoid standing for long periods of time. Exercise regularly Moisturize legs daily. - apply lotion every night before bed to left leg. Compression stocking or Garment 20-30 mm/Hg pressure to: - Left leg apply Jobst compression stocking. Apply in the morning and remove at night. Additional Orders / Instructions Follow Nutritious Diet Wound Treatment Wound #1 - Lower Leg Wound Laterality: Right, Anterior Cleanser: Soap and Water 1 x Per Week Discharge Instructions: May shower and wash wound with dial antibacterial soap and water prior to dressing  change. Cleanser: Wound Cleanser 1 x Per Week Discharge Instructions: Cleanse the wound with wound cleanser prior to applying a clean dressing using gauze sponges, not tissue or cotton balls. Peri-Wound Care: Triamcinolone 15 (g) 1 x Per Week Discharge Instructions: Apply liberally to wounds and periwound. Use triamcinolone 15 (g) as directed Peri-Wound Care: Sween Lotion (Moisturizing lotion) 1 x Per Week Discharge Instructions: Apply moisturizing lotion as directed Topical: Triamcinolone 1 x Per Week Discharge Instructions: Apply Triamcinolone to wounds. Prim Dressing: Hydrofera Blue Classic Foam, 4x4 in 1 x Per Week ary Discharge Instructions: Moisten with saline prior to applying to wound bed Secondary Dressing: Woven Gauze Sponge, Non-Sterile 4x4 in 1 x Per Week Discharge Instructions: Apply over primary dressing as directed. Compression Wrap: ThreePress (3 layer compression wrap) 1 x Per Week Discharge Instructions: Apply three layer compression as directed. Electronic Signature(s) Signed: 11/08/2020 4:38:36 PM By: Linton Ham MD Signed: 11/08/2020 6:02:04 PM By: Deon Pilling Entered By: Deon Pilling on 11/08/2020 15:39:26 -------------------------------------------------------------------------------- Problem List Details Patient Name: Date of Service: Leah [name], Leah Saunas NNE T. 11/08/2020 3:15 PM Medical Record Number: [id number] Patient Account Number: [id number] Date of Birth/Sex: Treating RN: 1930-01-31 (87 y.o. 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 11/08/2020 3:15 PM Medical Record Number: [id number] Patient Account Number: [id number] Date of Birth/Sex: Treating RN: 10-Sep-1929 (87 y.o. Leah [name] Primary Care Provider: Pricilla Holm Other Clinician: Referring Provider: Treating Provider/Extender: Charlie Pitter in Treatment: 5 Subjective History of Present Illness (HPI) ADMISSION 10/04/2020; This is an 87 year old woman who lives independently. Her husband was actually a longstanding patient in this clinic who passed away recently. She states her problem began in January with increased swelling in her legs she has a large area on the right anterior lower leg and 3 smaller areas on the left. She has a history of chronic venous insufficiency. I note that she had DVT studies on 03/29/2020 that were negative for DVT She was in her primary doctor's office late . in April and she was given Silvadene cream but she does not feel this has been helping. Recently she has been using Vaseline she has compression stockings but does not wear them reliably including juxta lites which belonged to her husband Past medical history includes chronic venous insufficiency, stage III chronic kidney disease, sciatica, peripheral edema, pelvic fractures ABIs in our clinic were normal 1.13 on the right and 1.06 on the left 5/12; arrives in clinic today  with only 1 wound left on the left leg which is the posterior left calf. Still an extensive area on the right anterior lower leg. Our intake nurse reports malodorous purulent looking drainage. We have been using silver alginate under compression 3 layer 5/19 the left posterior calf is closed. Again I get description of this of the right anterior leg is having a lot of drainage and malodor although she only appears to have a cluster of small wounds which really do not appear to be infected. The PCR culture I did last week showed Pseudomonas and staph aureus. I put this forward to [facility] although I think I am just going to use gentamicin on this under compression. I gave her Keflex last week but even the staff was likely to be MRSA. 6/2; left posterior calf is closed right anterior leg has 3 or 4 small scattered wounds. I used a #3 curette to clean up the surfaces of these. We have been using gentamicin I do not think that is going to be necessary. 6/9; her left posterior calf is remain closed and she has her stocking on. On the right anterior we have 2 open areas that I think are mostly epithelialized although they look vulnerable. For this reason I look towards putting her on another week of compression. She should be done by next week. We have been using Hydrofera Blue on the wound areas Objective Constitutional Patient is hypertensive.. Pulse regular and within target range for patient.Marland Kitchen Respirations regular, non-labored and within target range.. Temperature is normal and within the target range for the patient.Marland Kitchen Appears in no distress. Vitals Time Taken: 3:21 PM, Height: 60 in, Weight: 131 lbs, BMI: 25.6, Temperature: 98.0 F, Pulse: 87 bpm, Respiratory Rate: 18 breaths/min, Blood Pressure: 166/73 mmHg. Cardiovascular Pedal pulses are palpable. Edema control is excellent. General Notes: Wound exam; left posterior calf is closed although I did not look at this. She continues to have 2 small  open areas on the right leg I think both of these are epithelialized but I think they are vulnerable.  There is no evidence of surrounding infection Integumentary (Hair, Skin) Wound #1 status is Open. Original cause of wound was Gradually Appeared. The date acquired was: 06/02/2020. The wound has been in treatment 5 weeks. The wound is located on the Right,Anterior Lower Leg. The wound measures 5.5cm length x 2cm width x 0.1cm depth; 8.639cm^2 area and 0.864cm^3 volume. There is Fat Layer (Subcutaneous Tissue) exposed. There is no tunneling or undermining noted. There is a medium amount of serosanguineous drainage noted. The wound margin is distinct with the outline attached to the wound base. There is large (67-100%) pink, pale granulation within the wound bed. There is a small (1-33%) amount of necrotic tissue within the wound bed including Adherent Slough. Assessment Active Problems ICD-10 Chronic venous hypertension (idiopathic) with ulcer and inflammation of bilateral lower extremity Non-pressure chronic ulcer of other part of right lower leg with other specified severity Procedures Wound #1 Pre-procedure diagnosis of Wound #1 is a Venous Leg Ulcer located on the Right,Anterior Lower Leg . There was a Three Layer Compression Therapy Procedure by Baruch Gouty, RN. Post procedure Diagnosis Wound #1: Same as Pre-Procedure Plan Follow-up Appointments: Return Appointment in 1 week. - Dr. Dellia Nims Bathing/ Shower/ Hygiene: May shower with protection but do not get wound dressing(s) wet. - use a cast protector. Edema Control - Lymphedema / SCD / Other: Elevate legs to the level of the heart or above for 30 minutes daily and/or when sitting, a frequency of: - throughout the day. Avoid standing for long periods of time. Exercise regularly Moisturize legs daily. - apply lotion every night before bed to left leg. Compression stocking or Garment 20-30 mm/Hg pressure to: - Left leg apply Jobst  compression stocking. Apply in the morning and remove at night. Additional Orders / Instructions: Follow Nutritious Diet WOUND #1: - Lower Leg Wound Laterality: Right, Anterior Cleanser: Soap and Water 1 x Per Week/ Discharge Instructions: May shower and wash wound with dial antibacterial soap and water prior to dressing change. Cleanser: Wound Cleanser 1 x Per Week/ Discharge Instructions: Cleanse the wound with wound cleanser prior to applying a clean dressing using gauze sponges, not tissue or cotton balls. Peri-Wound Care: Triamcinolone 15 (g) 1 x Per Week/ Discharge Instructions: Apply liberally to wounds and periwound. Use triamcinolone 15 (g) as directed Peri-Wound Care: Sween Lotion (Moisturizing lotion) 1 x Per Week/ Discharge Instructions: Apply moisturizing lotion as directed Topical: Triamcinolone 1 x Per Week/ Discharge Instructions: Apply Triamcinolone to wounds. Prim Dressing: Hydrofera Blue Classic Foam, 4x4 in 1 x Per Week/ ary Discharge Instructions: Moisten with saline prior to applying to wound bed Secondary Dressing: Woven Gauze Sponge, Non-Sterile 4x4 in 1 x Per Week/ Discharge Instructions: Apply over primary dressing as directed. Com pression Wrap: ThreePress (3 layer compression wrap) 1 x Per Week/ Discharge Instructions: Apply three layer compression as directed. 1. Hydrofera Blue under 3 layer compression on the right 2. She should be healed by next week she has compression stockings for that eventuality Electronic Signature(s) Signed: 11/08/2020 4:38:36 PM By: Linton Ham MD Entered By: Linton Ham on 11/08/2020 15:49:54 -------------------------------------------------------------------------------- SuperBill Details Patient Name: Date of Service: Leah [name], Leah NNE T. 11/08/2020 Medical Record Number: [id number] Patient Account Number: [id number] Date of Birth/Sex: Treating RN: 12/05/1929 (87 y.o. Leah [name] Primary Care Provider: Pricilla Holm Other Clinician: Referring Provider: Treating Provider/Extender: Charlie Pitter in Treatment: 5 Diagnosis Coding ICD-10 Codes Code Description [phone] Chronic venous hypertension (idiopathic) with ulcer and inflammation of bilateral lower  extremity L97.818 Non-pressure chronic ulcer of other part of right lower leg with other specified severity Facility Procedures CPT4 Code: [id number] Description: (Facility Use Only) [phone] - APPLY [place] RT LEG Modifier: Quantity: 1 Physician Procedures Electronic Signature(s) Signed: 11/08/2020 4:38:36 PM By: Linton Ham MD Entered By: Linton Ham on 11/08/2020 15:50:10

## 2020-11-09 NOTE — Progress Notes (Signed)
Leah [name], Leah [name] ([id number]) . Visit Report for 11/08/2020 Arrival Information Details Patient Name: Date of Service: Leah [name]. 11/08/2020 3:15 PM Medical Record Number: [id number] Patient Account Number: [id number] Date of Birth/Sex: Treating RN: 1930-05-02 (87 y.o. Leah [name], Meta.Reding Primary Care Rameses Ou: Pricilla Holm Other Clinician: Referring Eman Rynders: Treating Maralee Higuchi/Extender: Charlie Pitter in Treatment: 5 Visit Information History Since Last Visit Added or deleted any medications: No Patient Arrived: Leah [name] Any new allergies or adverse reactions: No Arrival Time: 15:19 Had a fall or experienced change in No Accompanied By: self activities of daily living that may affect Transfer Assistance: None risk of falls: Patient Identification Verified: Yes Signs or symptoms of abuse/neglect since last visito No Secondary Verification Process Completed: Yes Hospitalized since last visit: No Patient Requires Transmission-Based Precautions: No Implantable device outside of the clinic excluding No Patient Has Alerts: No cellular tissue based products placed in the center since last visit: Has Dressing in Place as Prescribed: Yes Pain Present Now: No Electronic Signature(s) Signed: 11/09/2020 3:50:24 PM By: Sandre Kitty Entered By: Sandre Kitty on 11/08/2020 15:21:31 -------------------------------------------------------------------------------- Compression Therapy Details Patient Name: Date of Service: Leah [name]. 11/08/2020 3:15 PM Medical Record Number: [id number] Patient Account Number: [id number] Date of Birth/Sex: Treating RN: May 28, 1930 (87 y.o. Leah [name] Primary Care Cru Kritikos: Pricilla Holm Other Clinician: Referring Hara Milholland: Treating Rushil Kimbrell/Extender: Charlie Pitter in Treatment: 5 Compression Therapy Performed for Wound Assessment: Wound #1 Right,Anterior Lower  Leg Performed By: Clinician Baruch Gouty, RN Compression Type: Three Layer Post Procedure Diagnosis Same as Pre-procedure Electronic Signature(s) Signed: 11/08/2020 6:02:04 PM By: Deon Pilling Entered By: Deon Pilling on 11/08/2020 15:36:24 -------------------------------------------------------------------------------- Encounter Discharge Information Details Patient Name: Date of Service: Leah [name], Leah [name]. 11/08/2020 3:15 PM Medical Record Number: [id number] Patient Account Number: [id number] Date of Birth/Sex: Treating RN: 1930/02/10 (87 y.o. Leah [name] Primary Care Ashden Sonnenberg: Pricilla Holm Other Clinician: Referring Gearl Kimbrough: Treating Sayed Apostol/Extender: Charlie Pitter in Treatment: 5 Encounter Discharge Information Items Discharge Condition: Stable Ambulatory Status: Cane Discharge Destination: Home Transportation: Private Auto Accompanied By: self Schedule Follow-up Appointment: Yes Clinical Summary of Care: Patient Declined Electronic Signature(s) Signed: 11/08/2020 5:15:25 PM By: Baruch Gouty RN, BSN Entered By: Baruch Gouty on 11/08/2020 16:00:29 -------------------------------------------------------------------------------- Lower Extremity Assessment Details Patient Name: Date of Service: Leah [name]. 11/08/2020 3:15 PM Medical Record Number: [id number] Patient Account Number: [id number] Date of Birth/Sex: Treating RN: 04/07/1930 (87 y.o. Leah [name] Primary Care Tymarion Everard: Pricilla Holm Other Clinician: Referring Nayla Dias: Treating Natilie Krabbenhoft/Extender: Charlie Pitter in Treatment: 5 Edema Assessment Assessed: Shirlyn Goltz: No] [Right: Yes] Edema: [Left: Ye] [Right: s] Calf Left: Right: Point of Measurement: 29 cm From Medial Instep 31 cm Ankle Left: Right: Point of Measurement: 10 cm From Medial Instep 21 cm Vascular Assessment Pulses: Dorsalis Pedis Palpable:  [Right:Yes] Electronic Signature(s) Signed: 11/08/2020 5:42:55 PM By: Lorrin Jackson Entered By: Lorrin Jackson on 11/08/2020 15:29:18 -------------------------------------------------------------------------------- Multi Wound Chart Details Patient Name: Date of Service: Leah [name], Leah [name]. 11/08/2020 3:15 PM Medical Record Number: [id number] Patient Account Number: [id number] Date of Birth/Sex: Treating RN: [birth year] (87 y.o. Leah [name] Primary Care Roselle Norton: Pricilla Holm Other Clinician: Referring Linwood Gullikson: Treating Laurelin Elson/Extender: Charlie Pitter in Treatment: 5 Vital Signs Height(in): 60 Pulse(bpm): 103 Weight(lbs): 131 Blood Pressure(mmHg): 166/73 Body Mass Index(BMI): 26 Temperature(F): 98.0 Respiratory Rate(breaths/min): 18 Photos: [1:No Photos Right, Anterior Lower Leg] [N/A:N/A N/A] Wound Location: [1:Gradually Appeared] [N/A:N/A]  Wounding Event: [1:Venous Leg Ulcer] [N/A:N/A] Primary Etiology: [1:Peripheral Venous Disease,] [N/A:N/A] Comorbid History: [1:Osteoarthritis 06/02/2020] [N/A:N/A] Date Acquired: [1:5] [N/A:N/A] Weeks of Treatment: [1:Open] [N/A:N/A] Wound Status: [1:Yes] [N/A:N/A] Clustered Wound: [1:8] [N/A:N/A] Clustered Quantity: [1:5.5x2x0.1] [N/A:N/A] Measurements L x W x D (cm) [1:8.639] [N/A:N/A] A (cm) : rea [1:0.864] [N/A:N/A] Volume (cm) : [1:94.00%] [N/A:N/A] % Reduction in Area: [1:94.00%] [N/A:N/A] % Reduction in Volume: [1:Full Thickness Without Exposed] [N/A:N/A] Classification: [1:Support Structures Medium] [N/A:N/A] Exudate Amount: [1:Serosanguineous] [N/A:N/A] Exudate Type: [1:red, brown] [N/A:N/A] Exudate Color: [1:Distinct, outline attached] [N/A:N/A] Wound Margin: [1:Large (67-100%)] [N/A:N/A] Granulation Amount: [1:Pink, Pale] [N/A:N/A] Granulation Quality: [1:Small (1-33%)] [N/A:N/A] Necrotic Amount: [1:Fat Layer (Subcutaneous Tissue): Yes N/A] Exposed Structures: [1:Fascia: No  Tendon: No Muscle: No Joint: No Bone: No Large (67-100%)] [N/A:N/A] Epithelialization: [1:Compression Therapy] [N/A:N/A] Treatment Notes Electronic Signature(s) Signed: 11/08/2020 4:38:36 PM By: Linton Ham MD Signed: 11/08/2020 6:02:04 PM By: Deon Pilling Entered By: Linton Ham on 11/08/2020 15:46:31 -------------------------------------------------------------------------------- Multi-Disciplinary Care Plan Details Patient Name: Date of Service: Leah [name], Leah [name]. 11/08/2020 3:15 PM Medical Record Number: [id number] Patient Account Number: [id number] Date of Birth/Sex: Treating RN: Sep 21, 1929 (87 y.o. Leah [name], Leah [name] Primary Care Ayodele Sangalang: Pricilla Holm Other Clinician: Referring Javaria Knapke: Treating Eliya Bubar/Extender: Charlie Pitter in Treatment: 5 Active Inactive Abuse / Safety / Falls / Self Care Management Nursing Diagnoses: Potential for falls Potential for injury related to falls Goals: Patient will remain injury free related to falls Date Initiated: 10/04/2020 Target Resolution Date: 12/07/2020 Goal Status: Active Patient/caregiver will verbalize understanding of skin care regimen Date Initiated: 10/04/2020 Target Resolution Date: 12/07/2020 Goal Status: Active Interventions: Assess fall risk on admission and as needed Assess: immobility, friction, shearing, incontinence upon admission and as needed Provide education on fall prevention Notes: Electronic Signature(s) Signed: 11/08/2020 6:02:04 PM By: Deon Pilling Entered By: Deon Pilling on 11/08/2020 15:27:53 -------------------------------------------------------------------------------- Pain Assessment Details Patient Name: Date of Service: Leah [name]. 11/08/2020 3:15 PM Medical Record Number: [id number] Patient Account Number: [id number] Date of Birth/Sex: Treating RN: [birth year] (87 y.o. Leah [name], Leah [name] Primary Care Josephine Wooldridge: Pricilla Holm Other  Clinician: Referring Zaccheus Edmister: Treating Esteven Overfelt/Extender: Charlie Pitter in Treatment: 5 Active Problems Location of Pain Severity and Description of Pain Patient Has Paino No Site Locations Pain Management and Medication Current Pain Management: Electronic Signature(s) Signed: 11/08/2020 6:02:04 PM By: Deon Pilling Signed: 11/09/2020 3:50:24 PM By: Sandre Kitty Entered By: Sandre Kitty on 11/08/2020 15:21:52 -------------------------------------------------------------------------------- Patient/Caregiver Education Details Patient Name: Date of Service: Sheliah Hatch 6/9/2022andnbsp3:15 PM Medical Record Number: [id number] Patient Account Number: [id number] Date of Birth/Gender: Treating RN: 14-Jun-1929 (87 y.o. Leah [name] Primary Care Physician: Pricilla Holm Other Clinician: Referring Physician: Treating Physician/Extender: Charlie Pitter in Treatment: 5 Education Assessment Education Provided To: Patient Education Topics Provided Safety: Handouts: Safety 2 Methods: Explain/Verbal Responses: Reinforcements needed Electronic Signature(s) Signed: 11/08/2020 6:02:04 PM By: Deon Pilling Entered By: Deon Pilling on 11/08/2020 15:28:06 -------------------------------------------------------------------------------- Wound Assessment Details Patient Name: Date of Service: Leah [name]. 11/08/2020 3:15 PM Medical Record Number: [id number] Patient Account Number: [id number] Date of Birth/Sex: Treating RN: [birth year] (87 y.o. Leah [name] Primary Care Atasha Colebank: Pricilla Holm Other Clinician: Referring Gianella Chismar: Treating Bach Rocchi/Extender: Charlie Pitter in Treatment: 5 Wound Status Wound Number: 1 Primary Etiology: Venous Leg Ulcer Wound Location: Right, Anterior Lower Leg Wound Status: Open Wounding Event: Gradually Appeared Comorbid History:  Peripheral Venous Disease, Osteoarthritis Date Acquired: 06/02/2020 Weeks Of Treatment: 5  Clustered Wound: Yes Photos Wound Measurements Length: (cm) 5.5 Width: (cm) 2 Depth: (cm) 0.1 Clustered Quantity: 8 Area: (cm) 8.639 Volume: (cm) 0.864 % Reduction in Area: 94% % Reduction in Volume: 94% Epithelialization: Large (67-100%) Tunneling: No Undermining: No Wound Description Classification: Full Thickness Without Exposed Support Structures Wound Margin: Distinct, outline attached Exudate Amount: Medium Exudate Type: Serosanguineous Exudate Color: red, brown Foul Odor After Cleansing: No Slough/Fibrino Yes Wound Bed Granulation Amount: Large (67-100%) Exposed Structure Granulation Quality: Pink, Pale Fascia Exposed: No Necrotic Amount: Small (1-33%) Fat Layer (Subcutaneous Tissue) Exposed: Yes Necrotic Quality: Adherent Slough Tendon Exposed: No Muscle Exposed: No Joint Exposed: No Bone Exposed: No Treatment Notes Wound #1 (Lower Leg) Wound Laterality: Right, Anterior Cleanser Wound Cleanser Discharge Instruction: Cleanse the wound with wound cleanser prior to applying a clean dressing using gauze sponges, not tissue or cotton balls. Soap and Water Discharge Instruction: May shower and wash wound with dial antibacterial soap and water prior to dressing change. Peri-Wound Care Triamcinolone 15 (g) Discharge Instruction: Apply liberally to wounds and periwound. Use triamcinolone 15 (g) as directed Sween Lotion (Moisturizing lotion) Discharge Instruction: Apply moisturizing lotion as directed Topical Triamcinolone Discharge Instruction: Apply Triamcinolone to wounds. Primary Dressing Hydrofera Blue Classic Foam, 4x4 in Discharge Instruction: Moisten with saline prior to applying to wound bed Secondary Dressing Woven Gauze Sponge, Non-Sterile 4x4 in Discharge Instruction: Apply over primary dressing as directed. Secured With Compression Wrap ThreePress (3 layer  compression wrap) Discharge Instruction: Apply three layer compression as directed. Compression Stockings Add-Ons Electronic Signature(s) Signed: 11/08/2020 5:42:55 PM By: Lorrin Jackson Signed: 11/09/2020 3:50:24 PM By: Sandre Kitty Entered By: Sandre Kitty on 11/08/2020 16:57:43 -------------------------------------------------------------------------------- Vitals Details Patient Name: Date of Service: Leah [name], SUZA NNE [name]. 11/08/2020 3:15 PM Medical Record Number: [id number] Patient Account Number: [id number] Date of Birth/Sex: Treating RN: 25-Jun-1929 (87 y.o. Leah [name], Leah [name] Primary Care Lynnsie Linders: Pricilla Holm Other Clinician: Referring Ildefonso Keaney: Treating Murray Guzzetta/Extender: Charlie Pitter in Treatment: 5 Vital Signs Time Taken: 15:21 Temperature (F): 98.0 Height (in): 60 Pulse (bpm): 87 Weight (lbs): 131 Respiratory Rate (breaths/min): 18 Body Mass Index (BMI): 25.6 Blood Pressure (mmHg): 166/73 Reference Range: 80 - 120 mg / dl Electronic Signature(s) Signed: 11/09/2020 3:50:24 PM By: Sandre Kitty Entered By: Sandre Kitty on 11/08/2020 15:21:46

## 2020-11-13 ENCOUNTER — Encounter: Payer: Self-pay | Admitting: Gastroenterology

## 2020-11-13 ENCOUNTER — Ambulatory Visit: Payer: Medicare PPO | Admitting: Gastroenterology

## 2020-11-13 VITALS — BP 120/60 | HR 72 | Ht 60.0 in | Wt 131.0 lb

## 2020-11-13 DIAGNOSIS — K2100 Gastro-esophageal reflux disease with esophagitis, without bleeding: Secondary | ICD-10-CM

## 2020-11-13 DIAGNOSIS — Z8719 Personal history of other diseases of the digestive system: Secondary | ICD-10-CM | POA: Diagnosis not present

## 2020-11-13 NOTE — Patient Instructions (Signed)
You have been scheduled for an endoscopy. Please follow written instructions given to you at your visit today. If you use inhalers (even only as needed), please bring them with you on the day of your procedure.  Normal BMI (Body Mass Index- based on height and weight) is between 23 and 30. Your BMI today is Body mass index is 25.58 kg/m. Marland Kitchen Please consider follow up  regarding your BMI with your Primary Care Provider.  Thank you for choosing me and [facility].  [name]. [name]., MD., [name]

## 2020-11-13 NOTE — Progress Notes (Signed)
    History of Present Illness: This is an 87 year old female with a history of GERD with Grade D esophagitis and a history of an esophageal stricture. She was advised to schedule an EGD in Jan or Feb however she did not proceed as she was recovering from an S1 vertebroplasty in Jan and then her husband passed away in September 29, 2022.  She has intermittent mild reflux symptoms, intermittent regurgitation and vomiting.  All symptoms have substantially improved over the past several months.  She continues on pantoprazole once daily.  She denies dysphagia.  Current Medications, Allergies, Past Medical History, Past Surgical History, Family History and Social History were reviewed in [employer] record.   Physical Exam: General: Well developed, well nourished, no acute distress Head: Normocephalic and atraumatic Eyes: Sclerae anicteric, EOMI Ears: Normal auditory acuity Mouth: Not examined, mask on during Covid-19 pandemic Lungs: Clear throughout to auscultation Heart: Regular rate and rhythm; no murmurs, rubs or bruits Abdomen: Soft, non tender and non distended. No masses, hepatosplenomegaly or hernias noted. Normal Bowel sounds Rectal: Not done Musculoskeletal: Symmetrical with no gross deformities  Pulses:  Normal pulses noted Extremities: No clubbing, cyanosis, edema or deformities noted Neurological: Alert oriented x 4, grossly nonfocal Psychological:  Alert and cooperative. Normal mood and affect   Assessment and Recommendations:  GERD with Grade D erosive esophagitis and a history of an esophageal stricture.  Intermittent breakthrough symptoms and intermittent vomiting.  Schedule EGD to assess for esophageal healing and rule out underlying Barrett's esophagus.  Closely follow antireflux measures.  Continue pantoprazole 40 mg daily. The risks (including bleeding, perforation, infection, missed lesions, medication reactions and possible hospitalization or surgery if  complications occur), benefits, and alternatives to endoscopy with possible biopsy and possible dilation were discussed with the patient and they consent to proceed.

## 2020-11-15 ENCOUNTER — Other Ambulatory Visit: Payer: Self-pay

## 2020-11-15 ENCOUNTER — Encounter (HOSPITAL_BASED_OUTPATIENT_CLINIC_OR_DEPARTMENT_OTHER): Payer: Medicare PPO | Admitting: Internal Medicine

## 2020-11-15 DIAGNOSIS — L97812 Non-pressure chronic ulcer of other part of right lower leg with fat layer exposed: Secondary | ICD-10-CM | POA: Diagnosis not present

## 2020-11-15 DIAGNOSIS — I872 Venous insufficiency (chronic) (peripheral): Secondary | ICD-10-CM | POA: Diagnosis not present

## 2020-11-15 DIAGNOSIS — I87333 Chronic venous hypertension (idiopathic) with ulcer and inflammation of bilateral lower extremity: Secondary | ICD-10-CM | POA: Diagnosis not present

## 2020-11-15 DIAGNOSIS — I890 Lymphedema, not elsewhere classified: Secondary | ICD-10-CM | POA: Diagnosis not present

## 2020-11-15 DIAGNOSIS — N183 Chronic kidney disease, stage 3 unspecified: Secondary | ICD-10-CM | POA: Diagnosis not present

## 2020-11-15 DIAGNOSIS — L97818 Non-pressure chronic ulcer of other part of right lower leg with other specified severity: Secondary | ICD-10-CM | POA: Diagnosis not present

## 2020-11-15 NOTE — Progress Notes (Addendum)
AILEENE, LANUM T ([id number]) . Visit Report for 11/15/2020 Arrival Information Details Patient Name: Date of Service: Carlisle Cater NNE T. 11/15/2020 3:45 PM Medical Record Number: [id number] Patient Account Number: [id number] Date of Birth/Sex: Treating RN: Jun 28, 1929 (87 y.o. [name], Meta.Reding Primary Care Liany Mumpower: Pricilla Holm Other Clinician: Referring Cheyeanne Roadcap: Treating Story Conti/Extender: Charlie Pitter in Treatment: 6 Visit Information History Since Last Visit Added or deleted any medications: No Patient Arrived: Kasandra Knudsen Any new allergies or adverse reactions: No Arrival Time: 16:03 Had a fall or experienced change in No Accompanied By: self activities of daily living that may affect Transfer Assistance: None risk of falls: Patient Identification Verified: Yes Signs or symptoms of abuse/neglect since last visito No Secondary Verification Process Completed: Yes Hospitalized since last visit: No Patient Requires Transmission-Based Precautions: No Implantable device outside of the clinic excluding No Patient Has Alerts: No cellular tissue based products placed in the center since last visit: Has Dressing in Place as Prescribed: Yes Pain Present Now: No Electronic Signature(s) Signed: 11/15/2020 5:21:02 PM By: Sandre Kitty Entered By: Sandre Kitty on 11/15/2020 16:04:03 -------------------------------------------------------------------------------- Compression Therapy Details Patient Name: Date of Service: Carlisle Cater NNE T. 11/15/2020 3:45 PM Medical Record Number: [id number] Patient Account Number: [id number] Date of Birth/Sex: Treating RN: [birth year] (87 y.o. [name] Primary Care Dnya Hickle: Pricilla Holm Other Clinician: Referring Keola Heninger: Treating Tameem Pullara/Extender: Charlie Pitter in Treatment: 6 Compression Therapy Performed for Wound Assessment: Wound #1 Right,Anterior Lower  Leg Performed By: Clinician Baruch Gouty, RN Compression Type: Three Layer Post Procedure Diagnosis Same as Pre-procedure Electronic Signature(s) Signed: 11/15/2020 5:31:56 PM By: Deon Pilling Entered By: Deon Pilling on 11/15/2020 16:36:53 -------------------------------------------------------------------------------- Compression Therapy Details Patient Name: Date of Service: Carlisle Cater NNE T. 11/15/2020 3:45 PM Medical Record Number: [id number] Patient Account Number: [id number] Date of Birth/Sex: Treating RN: November 23, 1929 (87 y.o. [name] Primary Care Arianni Gallego: Pricilla Holm Other Clinician: Referring Alaynna Kerwood: Treating Izzak Fries/Extender: Charlie Pitter in Treatment: 6 Compression Therapy Performed for Wound Assessment: Wound #5 Left,Lateral Lower Leg Performed By: Clinician Baruch Gouty, RN Compression Type: Three Layer Post Procedure Diagnosis Same as Pre-procedure Electronic Signature(s) Signed: 11/15/2020 5:31:56 PM By: Deon Pilling Entered By: Deon Pilling on 11/15/2020 16:36:54 -------------------------------------------------------------------------------- Encounter Discharge Information Details Patient Name: Date of Service: Alben Spittle, Asa Saunas NNE T. 11/15/2020 3:45 PM Medical Record Number: [id number] Patient Account Number: [id number] Date of Birth/Sex: Treating RN: Oct 30, 1929 (87 y.o. Elam Dutch Primary Care Lisa-Marie Rueger: Pricilla Holm Other Clinician: Referring Nikkita Adeyemi: Treating Vonette Grosso/Extender: Charlie Pitter in Treatment: 6 Encounter Discharge Information Items Post Procedure Vitals Discharge Condition: Stable Temperature (F): 97.1 Ambulatory Status: Cane Pulse (bpm): 70 Discharge Destination: Home Respiratory Rate (breaths/min): 18 Transportation: Private Auto Blood Pressure (mmHg): 159/78 Accompanied By: self Schedule Follow-up Appointment: Yes Clinical  Summary of Care: Patient Declined Electronic Signature(s) Signed: 11/15/2020 5:31:32 PM By: Baruch Gouty RN, BSN Entered By: Baruch Gouty on 11/15/2020 17:22:39 -------------------------------------------------------------------------------- Lower Extremity Assessment Details Patient Name: Date of Service: Carlisle Cater NNE T. 11/15/2020 3:45 PM Medical Record Number: [id number] Patient Account Number: [id number] Date of Birth/Sex: Treating RN: [birth year] (87 y.o. Elam Dutch Primary Care Jakhari Space: Pricilla Holm Other Clinician: Referring Laurieanne Galloway: Treating Tanda Morrissey/Extender: Charlie Pitter in Treatment: 6 Edema Assessment Assessed: [Left: No] [Right: No] Edema: [Left: Ye] [Right: s] Calf Left: Right: Point of Measurement: 29 cm From Medial Instep 31 cm Ankle Left: Right: Point of Measurement: 10 cm From Medial Instep  21 cm Knee To Floor Left: Right: From Medial Instep 42 cm Electronic Signature(s) Signed: 11/15/2020 5:31:32 PM By: Baruch Gouty RN, BSN Entered By: Baruch Gouty on 11/15/2020 16:53:09 -------------------------------------------------------------------------------- Multi Wound Chart Details Patient Name: Date of Service: Alben Spittle, Asa Saunas NNE T. 11/15/2020 3:45 PM Medical Record Number: [id number] Patient Account Number: [id number] Date of Birth/Sex: Treating RN: 12/24/1929 (87 y.o. [name], Meta.Reding Primary Care Donavyn Fecher: Pricilla Holm Other Clinician: Referring Jamarco Zaldivar: Treating Alexandra Posadas/Extender: Charlie Pitter in Treatment: 6 Vital Signs Height(in): 60 Pulse(bpm): 70 Weight(lbs): 131 Blood Pressure(mmHg): 159/78 Body Mass Index(BMI): 26 Temperature(F): 97.1 Respiratory Rate(breaths/min): 18 Photos: [5:No Photos] [N/A:N/A] Right, Anterior Lower Leg Right, Lateral Lower Leg N/A Wound Location: Gradually Appeared Gradually Appeared N/A Wounding Event: Venous Leg  Ulcer Lymphedema N/A Primary Etiology: Peripheral Venous Disease, Peripheral Venous Disease, N/A Comorbid History: Osteoarthritis Osteoarthritis 06/02/2020 11/15/2020 N/A Date Acquired: 6 0 N/A Weeks of Treatment: Open Open N/A Wound Status: Yes No N/A Clustered Wound: 8 N/A N/A Clustered Quantity: 0.5x0.3x0.1 0.5x0.3x0.1 N/A Measurements L x W x D (cm) 0.118 0.118 N/A A (cm) : rea 0.012 0.012 N/A Volume (cm) : 99.90% 0.00% N/A % Reduction in Area: 99.90% 0.00% N/A % Reduction in Volume: Full Thickness Without Exposed Full Thickness Without Exposed N/A Classification: Support Structures Support Structures Medium Small N/A Exudate Amount: Serosanguineous Serosanguineous N/A Exudate Type: red, brown red, brown N/A Exudate Color: Distinct, outline attached Distinct, outline attached N/A Wound Margin: Large (67-100%) Medium (34-66%) N/A Granulation Amount: Pink, Pale Red N/A Granulation Quality: Small (1-33%) Medium (34-66%) N/A Necrotic Amount: Fat Layer (Subcutaneous Tissue): Yes N/A N/A Exposed Structures: Fascia: No Tendon: No Muscle: No Joint: No Bone: No Large (67-100%) Small (1-33%) N/A Epithelialization: Debridement - Selective/Open Wound Debridement - Selective/Open Wound N/A Debridement: Pre-procedure Verification/Time Out 16:28 16:28 N/A Taken: Lidocaine 4% Topical Solution Lidocaine 4% Topical Solution N/A Pain Control: Skin/Epidermis Skin/Epidermis N/A Level: 0.15 0.15 N/A Debridement A (sq cm): rea Curette Curette N/A Instrument: Minimum Minimum N/A Bleeding: Pressure Pressure N/A Hemostasis A chieved: 0 0 N/A Procedural Pain: 0 0 N/A Post Procedural Pain: Procedure was tolerated well Procedure was tolerated well N/A Debridement Treatment Response: 0.5x0.3x0.1 0.5x0.3x0.1 N/A Post Debridement Measurements L x W x D (cm) 0.012 0.012 N/A Post Debridement Volume: (cm) Compression Therapy Compression Therapy N/A Procedures  Performed: Debridement Debridement Treatment Notes Wound #1 (Lower Leg) Wound Laterality: Right, Anterior Cleanser Wound Cleanser Discharge Instruction: Cleanse the wound with wound cleanser prior to applying a clean dressing using gauze sponges, not tissue or cotton balls. Soap and Water Discharge Instruction: May shower and wash wound with dial antibacterial soap and water prior to dressing change. Peri-Wound Care Triamcinolone 15 (g) Discharge Instruction: Apply liberally to wounds and periwound. Use triamcinolone 15 (g) as directed Sween Lotion (Moisturizing lotion) Discharge Instruction: Apply moisturizing lotion as directed Topical Triamcinolone Discharge Instruction: Apply Triamcinolone to wounds. Primary Dressing Hydrofera Blue Classic Foam, 4x4 in Discharge Instruction: Moisten with saline prior to applying to wound bed Secondary Dressing Woven Gauze Sponge, Non-Sterile 4x4 in Discharge Instruction: Apply over primary dressing as directed. Secured With Compression Wrap ThreePress (3 layer compression wrap) Discharge Instruction: Apply three layer compression as directed. Compression Stockings Add-Ons Wound #5 (Lower Leg) Wound Laterality: Left, Lateral Cleanser Wound Cleanser Discharge Instruction: Cleanse the wound with wound cleanser prior to applying a clean dressing using gauze sponges, not tissue or cotton balls. Soap and Water Discharge Instruction: May shower and wash wound with dial antibacterial soap and water prior to  dressing change. Peri-Wound Care Triamcinolone 15 (g) Discharge Instruction: Apply liberally to wounds and periwound. Use triamcinolone 15 (g) as directed Sween Lotion (Moisturizing lotion) Discharge Instruction: Apply moisturizing lotion as directed Topical Triamcinolone Discharge Instruction: Apply Triamcinolone to wounds. Primary Dressing Hydrofera Blue Classic Foam, 4x4 in Discharge Instruction: Moisten with saline prior to applying to  wound bed Secondary Dressing Woven Gauze Sponge, Non-Sterile 4x4 in Discharge Instruction: Apply over primary dressing as directed. Secured With Compression Wrap ThreePress (3 layer compression wrap) Discharge Instruction: Apply three layer compression as directed. Compression Stockings Add-Ons Electronic Signature(s) Signed: 11/16/2020 4:58:53 PM By: Deon Pilling Previous Signature: 11/15/2020 5:31:56 PM Version By: Deon Pilling Entered By: Deon Pilling on 11/16/2020 07:27:25 -------------------------------------------------------------------------------- [place] Details Patient Name: Date of Service: Alben Spittle, Asa Saunas NNE T. 11/15/2020 3:45 PM Medical Record Number: [id number] Patient Account Number: [id number] Date of Birth/Sex: Treating RN: 20-Dec-1929 (87 y.o. [name], [name] Primary Care Timiya Howells: Pricilla Holm Other Clinician: Referring Myrta Mercer: Treating Viann Nielson/Extender: Charlie Pitter in Treatment: 6 Active Inactive Abuse / Safety / Falls / Self Care Management Nursing Diagnoses: Potential for falls Potential for injury related to falls Goals: Patient will remain injury free related to falls Date Initiated: 10/04/2020 Target Resolution Date: 12/07/2020 Goal Status: Active Patient/caregiver will verbalize understanding of skin care regimen Date Initiated: 10/04/2020 Target Resolution Date: 12/07/2020 Goal Status: Active Interventions: Assess fall risk on admission and as needed Assess: immobility, friction, shearing, incontinence upon admission and as needed Provide education on fall prevention Notes: Electronic Signature(s) Signed: 11/15/2020 5:31:56 PM By: Deon Pilling Entered By: Deon Pilling on 11/15/2020 16:08:43 -------------------------------------------------------------------------------- Pain Assessment Details Patient Name: Date of Service: Carlisle Cater NNE T. 11/15/2020 3:45 PM Medical Record  Number: [id number] Patient Account Number: [id number] Date of Birth/Sex: Treating RN: [birth year] (87 y.o. [name], [name] Primary Care Waverley Krempasky: Pricilla Holm Other Clinician: Referring Daymion Nazaire: Treating Freddie Dymek/Extender: Charlie Pitter in Treatment: 6 Active Problems Location of Pain Severity and Description of Pain Patient Has Paino No Site Locations Pain Management and Medication Current Pain Management: Electronic Signature(s) Signed: 11/15/2020 5:21:02 PM By: Sandre Kitty Signed: 11/15/2020 5:31:56 PM By: Deon Pilling Entered By: Sandre Kitty on 11/15/2020 16:04:37 -------------------------------------------------------------------------------- Patient/Caregiver Education Details Patient Name: Date of Service: Sheliah Hatch 6/16/2022andnbsp3:45 PM Medical Record Number: [id number] Patient Account Number: [id number] Date of Birth/Gender: Treating RN: [birth year] (87 y.o. [name] Primary Care Physician: Pricilla Holm Other Clinician: Referring Physician: Treating Physician/Extender: Charlie Pitter in Treatment: 6 Education Assessment Education Provided To: Patient Education Topics Provided Wound/Skin Impairment: Handouts: Skin Care Do's and Dont's Methods: Explain/Verbal Responses: Reinforcements needed Electronic Signature(s) Signed: 11/15/2020 5:31:56 PM By: Deon Pilling Entered By: Deon Pilling on 11/15/2020 16:08:59 -------------------------------------------------------------------------------- Wound Assessment Details Patient Name: Date of Service: Carlisle Cater NNE T. 11/15/2020 3:45 PM Medical Record Number: [id number] Patient Account Number: [id number] Date of Birth/Sex: Treating RN: 02/04/1930 (87 y.o. [name], [name] Primary Care Yarden Hillis: Pricilla Holm Other Clinician: Referring Aseneth Hack: Treating Rubi Tooley/Extender: Charlie Pitter in Treatment: 6 Wound Status Wound Number: 1 Primary Etiology: Venous Leg Ulcer Wound Location: Right, Anterior Lower Leg Wound Status: Open Wounding Event: Gradually Appeared Comorbid History: Peripheral Venous Disease, Osteoarthritis Date Acquired: 06/02/2020 Weeks Of Treatment: 6 Clustered Wound: Yes Photos Wound Measurements Length: (cm) 0.5 Width: (cm) 0.3 Depth: (cm) 0.1 Clustered Quantity: 8 Area: (cm) 0.118 Volume: (cm) 0.012 % Reduction in Area: 99.9% % Reduction in Volume: 99.9% Epithelialization: Large (67-100%)  Wound Description Classification: Full Thickness Without Exposed Support Structures Wound Margin: Distinct, outline attached Exudate Amount: Medium Exudate Type: Serosanguineous Exudate Color: red, brown Foul Odor After Cleansing: No Slough/Fibrino Yes Wound Bed Granulation Amount: Large (67-100%) Exposed Structure Granulation Quality: Pink, Pale Fascia Exposed: No Necrotic Amount: Small (1-33%) Fat Layer (Subcutaneous Tissue) Exposed: Yes Necrotic Quality: Adherent Slough Tendon Exposed: No Muscle Exposed: No Joint Exposed: No Bone Exposed: No Treatment Notes Wound #1 (Lower Leg) Wound Laterality: Right, Anterior Cleanser Wound Cleanser Discharge Instruction: Cleanse the wound with wound cleanser prior to applying a clean dressing using gauze sponges, not tissue or cotton balls. Soap and Water Discharge Instruction: May shower and wash wound with dial antibacterial soap and water prior to dressing change. Peri-Wound Care Triamcinolone 15 (g) Discharge Instruction: Apply liberally to wounds and periwound. Use triamcinolone 15 (g) as directed Sween Lotion (Moisturizing lotion) Discharge Instruction: Apply moisturizing lotion as directed Topical Triamcinolone Discharge Instruction: Apply Triamcinolone to wounds. Primary Dressing Hydrofera Blue Classic Foam, 4x4 in Discharge Instruction: Moisten with saline prior to applying  to wound bed Secondary Dressing Woven Gauze Sponge, Non-Sterile 4x4 in Discharge Instruction: Apply over primary dressing as directed. Secured With Compression Wrap ThreePress (3 layer compression wrap) Discharge Instruction: Apply three layer compression as directed. Compression Stockings Add-Ons Electronic Signature(s) Signed: 11/15/2020 5:21:02 PM By: Sandre Kitty Signed: 11/15/2020 5:31:56 PM By: Deon Pilling Entered By: Sandre Kitty on 11/15/2020 17:19:04 -------------------------------------------------------------------------------- Wound Assessment Details Patient Name: Date of Service: Alben Spittle, Asa Saunas NNE T. 11/15/2020 3:45 PM Medical Record Number: [id number] Patient Account Number: [id number] Date of Birth/Sex: Treating RN: 10-28-1929 (87 y.o. [name], Meta.Reding Primary Care Damien Batty: Pricilla Holm Other Clinician: Referring Rowen Wilmer: Treating Felise Georgia/Extender: Charlie Pitter in Treatment: 6 Wound Status Wound Number: 5 Primary Etiology: Lymphedema Wound Location: Right, Lateral Lower Leg Wound Status: Open Wounding Event: Gradually Appeared Comorbid History: Peripheral Venous Disease, Osteoarthritis Date Acquired: 11/15/2020 Weeks Of Treatment: 0 Clustered Wound: No Wound Measurements Length: (cm) 0.5 Width: (cm) 0.3 Depth: (cm) 0.1 Area: (cm) 0.118 Volume: (cm) 0.012 % Reduction in Area: 0% % Reduction in Volume: 0% Epithelialization: Small (1-33%) Tunneling: No Undermining: No Wound Description Classification: Full Thickness Without Exposed Support Structures Wound Margin: Distinct, outline attached Exudate Amount: Small Exudate Type: Serosanguineous Exudate Color: red, brown Foul Odor After Cleansing: No Slough/Fibrino Yes Wound Bed Granulation Amount: Medium (34-66%) Granulation Quality: Red Necrotic Amount: Medium (34-66%) Necrotic Quality: Adherent Slough Treatment Notes Wound #5 (Lower Leg) Wound  Laterality: Left, Lateral Cleanser Wound Cleanser Discharge Instruction: Cleanse the wound with wound cleanser prior to applying a clean dressing using gauze sponges, not tissue or cotton balls. Soap and Water Discharge Instruction: May shower and wash wound with dial antibacterial soap and water prior to dressing change. Peri-Wound Care Triamcinolone 15 (g) Discharge Instruction: Apply liberally to wounds and periwound. Use triamcinolone 15 (g) as directed Sween Lotion (Moisturizing lotion) Discharge Instruction: Apply moisturizing lotion as directed Topical Triamcinolone Discharge Instruction: Apply Triamcinolone to wounds. Primary Dressing Hydrofera Blue Classic Foam, 4x4 in Discharge Instruction: Moisten with saline prior to applying to wound bed Secondary Dressing Woven Gauze Sponge, Non-Sterile 4x4 in Discharge Instruction: Apply over primary dressing as directed. Secured With Compression Wrap ThreePress (3 layer compression wrap) Discharge Instruction: Apply three layer compression as directed. Compression Stockings Add-Ons Electronic Signature(s) Signed: 11/16/2020 4:58:53 PM By: Deon Pilling Previous Signature: 11/15/2020 5:31:56 PM Version By: Deon Pilling Entered By: Deon Pilling on 11/16/2020 07:25:15 -------------------------------------------------------------------------------- Vitals Details Patient Name: Date of Service:  KIO RPES, SUZA NNE T. 11/15/2020 3:45 PM Medical Record Number: [id number] Patient Account Number: [id number] Date of Birth/Sex: Treating RN: [birth year] (87 y.o. [name], [name] Primary Care Meli Faley: Pricilla Holm Other Clinician: Referring Laprecious Austill: Treating Anays Detore/Extender: Charlie Pitter in Treatment: 6 Vital Signs Time Taken: 16:04 Temperature (F): 97.1 Height (in): 60 Pulse (bpm): 70 Weight (lbs): 131 Respiratory Rate (breaths/min): 18 Body Mass Index (BMI): 25.6 Blood Pressure (mmHg):  159/78 Reference Range: 80 - 120 mg / dl Electronic Signature(s) Signed: 11/15/2020 5:21:02 PM By: Sandre Kitty Entered By: Sandre Kitty on 11/15/2020 16:04:21

## 2020-11-16 NOTE — Progress Notes (Signed)
Leah [name], Leah [name] ([id number]) . Visit Report for 11/15/2020 Debridement Details Patient Name: Date of Service: Leah Cater NNE [name]. 11/15/2020 3:45 PM Medical Record Number: [id number] Patient Account Number: [id number] Date of Birth/Sex: Treating Leah [name]: [birth year] (87 y.o. Leah [name], Leah [name] Primary Care Provider: Pricilla Holm Other Clinician: Referring Provider: Treating Provider/Extender: Charlie Pitter in Treatment: 6 Debridement Performed for Assessment: Wound #1 Right,Anterior Lower Leg Performed By: Physician Ricard Dillon., MD Debridement Type: Debridement Severity of Tissue Pre Debridement: Fat layer exposed Level of Consciousness (Pre-procedure): Awake and Alert Pre-procedure Verification/Time Out Yes - 16:28 Taken: Start Time: 16:29 Pain Control: Lidocaine 4% [name] opical Solution [name] Area Debrided (L x W): otal 0.5 (cm) x 0.3 (cm) = 0.15 (cm) Tissue and other material debrided: Viable, Skin: Epidermis Level: Skin/Epidermis Debridement Description: Selective/Open Wound Instrument: Curette Bleeding: Minimum Hemostasis Achieved: Pressure End Time: 16:35 Procedural Pain: 0 Post Procedural Pain: 0 Response to Treatment: Procedure was tolerated well Level of Consciousness (Post- Awake and Alert procedure): Post Debridement Measurements of Total Wound Length: (cm) 0.5 Width: (cm) 0.3 Depth: (cm) 0.1 Volume: (cm) 0.012 Character of Wound/Ulcer Post Debridement: Improved Severity of Tissue Post Debridement: Fat layer exposed Post Procedure Diagnosis Same as Pre-procedure Electronic Signature(s) Signed: 11/15/2020 5:31:56 PM By: Deon Pilling Signed: 11/16/2020 10:33:35 AM By: Linton Ham MD Entered By: Deon Pilling on 11/15/2020 16:35:54 -------------------------------------------------------------------------------- Debridement Details Patient Name: Date of Service: Leah Cater NNE [name]. 11/15/2020 3:45 PM Medical Record Number:  [id number] Patient Account Number: [id number] Date of Birth/Sex: Treating Leah [name]: 03-Apr-1930 (87 y.o. Leah [name], Leah [name] Primary Care Provider: Pricilla Holm Other Clinician: Referring Provider: Treating Provider/Extender: Charlie Pitter in Treatment: 6 Debridement Performed for Assessment: Wound #5 Right,Lateral Lower Leg Performed By: Physician Ricard Dillon., MD Debridement Type: Debridement Level of Consciousness (Pre-procedure): Awake and Alert Pre-procedure Verification/Time Out Yes - 16:28 Taken: Start Time: 16:29 Pain Control: Lidocaine 4% [name] opical Solution [name] Area Debrided (L x W): otal 0.5 (cm) x 0.3 (cm) = 0.15 (cm) Tissue and other material debrided: Viable, Skin: Epidermis Level: Skin/Epidermis Debridement Description: Selective/Open Wound Instrument: Curette Bleeding: Minimum Hemostasis Achieved: Pressure End Time: 16:35 Procedural Pain: 0 Post Procedural Pain: 0 Response to Treatment: Procedure was tolerated well Level of Consciousness (Post- Awake and Alert procedure): Post Debridement Measurements of Total Wound Length: (cm) 0.5 Width: (cm) 0.3 Depth: (cm) 0.1 Volume: (cm) 0.012 Character of Wound/Ulcer Post Debridement: Improved Post Procedure Diagnosis Same as Pre-procedure Electronic Signature(s) Signed: 11/16/2020 10:33:35 AM By: Linton Ham MD Signed: 11/16/2020 4:58:53 PM By: Deon Pilling Previous Signature: 11/15/2020 5:31:56 PM Version By: Deon Pilling Entered By: Deon Pilling on 11/16/2020 07:26:16 -------------------------------------------------------------------------------- HPI Details Patient Name: Date of Service: Leah [name], Leah Saunas NNE [name]. 11/15/2020 3:45 PM Medical Record Number: [id number] Patient Account Number: [id number] Date of Birth/Sex: Treating Leah [name]: 1929/09/30 (87 y.o. Leah [name] Primary Care Provider: Pricilla Holm Other Clinician: Referring Provider: Treating Provider/Extender:  Charlie Pitter in Treatment: 6 History of Present Illness HPI Description: ADMISSION 10/04/2020; This is an 87 year old woman who lives independently. Her husband was actually a longstanding patient in this clinic who passed away recently. She states her problem began in January with increased swelling in her legs she has a large area on the right anterior lower leg and 3 smaller areas on the left. She has a history of chronic venous insufficiency. I note that she had DVT studies on 03/29/2020 that were negative for DVT She was in her  primary doctor's office late . in April and she was given Silvadene cream but she does not feel this has been helping. Recently she has been using Vaseline she has compression stockings but does not wear them reliably including juxta lites which belonged to her husband Past medical history includes chronic venous insufficiency, stage III chronic kidney disease, sciatica, peripheral edema, pelvic fractures ABIs in our clinic were normal 1.13 on the right and 1.06 on the left 5/12; arrives in clinic today with only 1 wound left on the left leg which is the posterior left calf. Still an extensive area on the right anterior lower leg. Our intake nurse reports malodorous purulent looking drainage. We have been using silver alginate under compression 3 layer 5/19 the left posterior calf is closed. Again I get description of this of the right anterior leg is having a lot of drainage and malodor although she only appears to have a cluster of small wounds which really do not appear to be infected. The PCR culture I did last week showed Pseudomonas and staph aureus. I put this forward to [facility] although I think I am just going to use gentamicin on this under compression. I gave her Keflex last week but even the staff was likely to be MRSA. 6/2; left posterior calf is closed right anterior leg has 3 or 4 small scattered wounds. I used a #3 curette to  clean up the surfaces of these. We have been using gentamicin I do not think that is going to be necessary. 6/9; her left posterior calf is remain closed and she has her stocking on. On the right anterior we have 2 open areas that I think are mostly epithelialized although they look vulnerable. For this reason I look towards putting her on another week of compression. She should be done by next week. We have been using Hydrofera Blue on the wound areas 6/16 and; patient has a stocking on her left leg . There are 2 open areas on the right anterior tibial area and just laterally to this area. These are small areas both with surface slough Electronic Signature(s) Signed: 11/16/2020 10:33:35 AM By: Linton Ham MD Entered By: Linton Ham on 11/15/2020 19:12:25 -------------------------------------------------------------------------------- Physical Exam Details Patient Name: Date of Service: Leah Cater NNE [name]. 11/15/2020 3:45 PM Medical Record Number: [id number] Patient Account Number: [id number] Date of Birth/Sex: Treating Leah [name]: 25-May-1930 (87 y.o. Leah [name] Primary Care Provider: Pricilla Holm Other Clinician: Referring Provider: Treating Provider/Extender: Charlie Pitter in Treatment: 6 Constitutional Patient is hypertensive.. Pulse regular and within target range for patient.Marland Kitchen Respirations regular, non-labored and within target range.. Temperature is normal and within the target range for the patient.Marland Kitchen Appears in no distress. Notes wound exam; the patient had a stocking on her left leg I did not look at her left posterior calf which is the site of the previous wounds. On the right she had 2 small anterior areas 1 just lateral to the tibia and one over the tibia using a #3 curet on both of these areas. Electronic Signature(s) Signed: 11/16/2020 10:33:35 AM By: Linton Ham MD Entered By: Linton Ham on 11/15/2020  19:13:33 -------------------------------------------------------------------------------- Physician Orders Details Patient Name: Date of Service: Leah Cater NNE [name]. 11/15/2020 3:45 PM Medical Record Number: [id number] Patient Account Number: [id number] Date of Birth/Sex: Treating Leah [name]: 18-Oct-1929 (87 y.o. Leah [name] Primary Care Provider: Pricilla Holm Other Clinician: Referring Provider: Treating Provider/Extender: Charlie Pitter in Treatment: 6 Verbal /  Phone Orders: No Diagnosis Coding ICD-10 Coding Code Description I87.333 Chronic venous hypertension (idiopathic) with ulcer and inflammation of bilateral lower extremity L97.818 Non-pressure chronic ulcer of other part of right lower leg with other specified severity Follow-up Appointments ppointment in 1 week. - Dr. Dellia Nims Return A Bathing/ Shower/ Hygiene May shower with protection but do not get wound dressing(s) wet. - use a cast protector. Edema Control - Lymphedema / SCD / Other Elevate legs to the level of the heart or above for 30 minutes daily and/or when sitting, a frequency of: - throughout the day. Avoid standing for long periods of time. Exercise regularly Moisturize legs daily. - apply lotion every night before bed to left leg. Compression stocking or Garment 20-30 mm/Hg pressure to: - Left leg apply Jobst compression stocking. Apply in the morning and remove at night. Additional Orders / Instructions Follow Nutritious Diet Wound Treatment Wound #1 - Lower Leg Wound Laterality: Right, Anterior Cleanser: Soap and Water 1 x Per Week/30 Days Discharge Instructions: May shower and wash wound with dial antibacterial soap and water prior to dressing change. Cleanser: Wound Cleanser 1 x Per Week/30 Days Discharge Instructions: Cleanse the wound with wound cleanser prior to applying a clean dressing using gauze sponges, not tissue or cotton balls. Peri-Wound Care: Triamcinolone 15  (g) 1 x Per Week/30 Days Discharge Instructions: Apply liberally to wounds and periwound. Use triamcinolone 15 (g) as directed Peri-Wound Care: Sween Lotion (Moisturizing lotion) 1 x Per Week/30 Days Discharge Instructions: Apply moisturizing lotion as directed Topical: Triamcinolone 1 x Per Week/30 Days Discharge Instructions: Apply Triamcinolone to wounds. Prim Dressing: Hydrofera Blue Classic Foam, 4x4 in 1 x Per Week/30 Days ary Discharge Instructions: Moisten with saline prior to applying to wound bed Secondary Dressing: Woven Gauze Sponge, Non-Sterile 4x4 in 1 x Per Week/30 Days Discharge Instructions: Apply over primary dressing as directed. Compression Wrap: ThreePress (3 layer compression wrap) 1 x Per Week/30 Days Discharge Instructions: Apply three layer compression as directed. Compression Stockings: Jobst Farrow Wrap 4000 (DME) Right Leg Compression Amount: 30-40 mmHG Discharge Instructions: Apply Francia Greaves daily as instructed. Apply first thing in the morning, remove at night before bed. Wound #5 - Lower Leg Wound Laterality: Right, Lateral Cleanser: Soap and Water 1 x Per Week/30 Days Discharge Instructions: May shower and wash wound with dial antibacterial soap and water prior to dressing change. Cleanser: Wound Cleanser 1 x Per Week/30 Days Discharge Instructions: Cleanse the wound with wound cleanser prior to applying a clean dressing using gauze sponges, not tissue or cotton balls. Peri-Wound Care: Triamcinolone 15 (g) 1 x Per Week/30 Days Discharge Instructions: Apply liberally to wounds and periwound. Use triamcinolone 15 (g) as directed Peri-Wound Care: Sween Lotion (Moisturizing lotion) 1 x Per Week/30 Days Discharge Instructions: Apply moisturizing lotion as directed Topical: Triamcinolone 1 x Per Week/30 Days Discharge Instructions: Apply Triamcinolone to wounds. Prim Dressing: Hydrofera Blue Classic Foam, 4x4 in 1 x Per Week/30 Days ary Discharge Instructions:  Moisten with saline prior to applying to wound bed Secondary Dressing: Woven Gauze Sponge, Non-Sterile 4x4 in 1 x Per Week/30 Days Discharge Instructions: Apply over primary dressing as directed. Compression Wrap: ThreePress (3 layer compression wrap) 1 x Per Week/30 Days Discharge Instructions: Apply three layer compression as directed. Electronic Signature(s) Signed: 11/16/2020 10:33:35 AM By: Linton Ham MD Signed: 11/16/2020 4:58:53 PM By: Deon Pilling Previous Signature: 11/15/2020 5:31:56 PM Version By: Deon Pilling Entered By: Deon Pilling on 11/16/2020 07:27:09 -------------------------------------------------------------------------------- Problem List Details Patient Name: Date of  Service: Leah Cater NNE [name]. 11/15/2020 3:45 PM Medical Record Number: [id number] Patient Account Number: [id number] Date of Birth/Sex: Treating Leah [name]: [birth year] (87 y.o. 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 11/15/2020 3:45 PM Medical Record Number: [id number] Patient Account Number: [id number] Date of Birth/Sex: Treating Leah [name]: 12-20-1929 (87 y.o. Leah [name] Primary Care Provider: Pricilla Holm Other Clinician: Referring Provider: Treating Provider/Extender: Charlie Pitter in Treatment: 6 Subjective History of Present Illness (HPI) ADMISSION 10/04/2020; This is an 87 year old woman who lives independently. Her husband was actually a longstanding patient in this clinic who passed away recently. She states her problem began in January with increased swelling in her legs she has a large area on the right anterior lower leg and 3 smaller areas on the left. She has a history of chronic venous insufficiency. I note that she had DVT studies on 03/29/2020 that were negative for DVT She was in her primary doctor's office late . in April and she was given Silvadene cream but she does not feel this has been helping. Recently she has been using Vaseline she has compression stockings but does not wear them reliably including juxta lites which belonged to her husband Past medical history includes chronic venous insufficiency, stage III chronic kidney disease, sciatica, peripheral edema, pelvic fractures ABIs in our clinic were normal 1.13 on the right and 1.06 on the left 5/12; arrives in clinic today with only 1 wound left on the left leg which is the posterior left calf. Still an extensive area on the right anterior lower leg. Our intake nurse reports malodorous purulent looking drainage. We have been using silver alginate under compression 3 layer 5/19 the left posterior calf is closed. Again I get description of this of the right anterior leg is having a lot of drainage and malodor although she only appears to have a cluster of small wounds which really do not appear to be infected. The PCR culture I did  last week showed Pseudomonas and staph aureus. I put this forward to [facility] although I think I am just going to use gentamicin on this under compression. I gave her Keflex last week but even the staff was likely to be MRSA. 6/2; left posterior calf is closed right anterior leg has 3 or 4 small scattered wounds. I used a #3 curette to clean up the surfaces of these. We have been using gentamicin I do not think that is going to be necessary. 6/9; her left posterior calf is remain closed and she has her stocking on. On the right anterior we have 2 open areas that I think are mostly epithelialized although they look vulnerable. For this reason I look towards putting her on  another week of compression. She should be done by next week. We have been using Hydrofera Blue on the wound areas 6/16 and; patient has a stocking on her left leg . There are 2 open areas on the right anterior tibial area and just laterally to this area. These are small areas both with surface slough Objective Constitutional Patient is hypertensive.. Pulse regular and within target range for patient.Marland Kitchen Respirations regular, non-labored and within target range.. Temperature is normal and within the target range for the patient.Marland Kitchen Appears in no distress. Vitals Time Taken: 4:04 PM, Height: 60 in, Weight: 131 lbs, BMI: 25.6, Temperature: 97.1 F, Pulse: 70 bpm, Respiratory Rate: 18 breaths/min, Blood Pressure: 159/78 mmHg. General Notes: wound exam; the patient had a stocking on her left leg I did not look at her left posterior calf which is the site of the previous wounds. On the right she had 2 small anterior areas 1 just lateral to the tibia and one over the tibia using a #3 curet on both of these areas. Integumentary (Hair, Skin) Wound #1 status is Open. Original cause of wound was Gradually Appeared. The date acquired was: 06/02/2020. The wound has been in treatment 6 weeks. The wound is located on the Right,Anterior Lower Leg.  The wound measures 0.5cm length x 0.3cm width x 0.1cm depth; 0.118cm^2 area and 0.012cm^3 volume. There is Fat Layer (Subcutaneous Tissue) exposed. There is a medium amount of serosanguineous drainage noted. The wound margin is distinct with the outline attached to the wound base. There is large (67-100%) pink, pale granulation within the wound bed. There is a small (1-33%) amount of necrotic tissue within the wound bed including Adherent Slough. Wound #5 status is Open. Original cause of wound was Gradually Appeared. The date acquired was: 11/15/2020. The wound is located on the Right,Lateral Lower Leg. The wound measures 0.5cm length x 0.3cm width x 0.1cm depth; 0.118cm^2 area and 0.012cm^3 volume. There is no tunneling or undermining noted. There is a small amount of serosanguineous drainage noted. The wound margin is distinct with the outline attached to the wound base. There is medium (34- 66%) red granulation within the wound bed. There is a medium (34-66%) amount of necrotic tissue within the wound bed including Adherent Slough. Assessment Active Problems ICD-10 Chronic venous hypertension (idiopathic) with ulcer and inflammation of bilateral lower extremity Non-pressure chronic ulcer of other part of right lower leg with other specified severity Procedures Wound #1 Pre-procedure diagnosis of Wound #1 is a Venous Leg Ulcer located on the Right,Anterior Lower Leg .Severity of Tissue Pre Debridement is: Fat layer exposed. There was a Selective/Open Wound Skin/Epidermis Debridement with a total area of 0.15 sq cm performed by Ricard Dillon., MD. With the following instrument(s): Curette to remove Viable tissue/material. Material removed includes Skin: Epidermis after achieving pain control using Lidocaine 4% Topical Solution. A time out was conducted at 16:28, prior to the start of the procedure. A Minimum amount of bleeding was controlled with Pressure. The procedure was tolerated well  with a pain level of 0 throughout and a pain level of 0 following the procedure. Post Debridement Measurements: 0.5cm length x 0.3cm width x 0.1cm depth; 0.012cm^3 volume. Character of Wound/Ulcer Post Debridement is improved. Severity of Tissue Post Debridement is: Fat layer exposed. Post procedure Diagnosis Wound #1: Same as Pre-Procedure Pre-procedure diagnosis of Wound #1 is a Venous Leg Ulcer located on the Right,Anterior Lower Leg . There was a Three Layer Compression Therapy Procedure by Leah Gouty, Leah [name]. Post  procedure Diagnosis Wound #1: Same as Pre-Procedure Wound #5 Pre-procedure diagnosis of Wound #5 is a Lymphedema located on the Right,Lateral Lower Leg . There was a Selective/Open Wound Skin/Epidermis Debridement with a total area of 0.15 sq cm performed by Ricard Dillon., MD. With the following instrument(s): Curette to remove Viable tissue/material. Material removed includes Skin: Epidermis after achieving pain control using Lidocaine 4% Topical Solution. A time out was conducted at 16:28, prior to the start of the procedure. A Minimum amount of bleeding was controlled with Pressure. The procedure was tolerated well with a pain level of 0 throughout and a pain level of 0 following the procedure. Post Debridement Measurements: 0.5cm length x 0.3cm width x 0.1cm depth; 0.012cm^3 volume. Character of Wound/Ulcer Post Debridement is improved. Post procedure Diagnosis Wound #5: Same as Pre-Procedure Pre-procedure diagnosis of Wound #5 is a Lymphedema located on the Left,Lateral Lower Leg . There was a Three Layer Compression Therapy Procedure by Leah Gouty, Leah [name]. Post procedure Diagnosis Wound #5: Same as Pre-Procedure Plan Follow-up Appointments: Return Appointment in 1 week. - Dr. Dellia Nims Bathing/ Shower/ Hygiene: May shower with protection but do not get wound dressing(s) wet. - use a cast protector. Edema Control - Lymphedema / SCD / Other: Elevate legs to the level of  the heart or above for 30 minutes daily and/or when sitting, a frequency of: - throughout the day. Avoid standing for long periods of time. Exercise regularly Moisturize legs daily. - apply lotion every night before bed to left leg. Compression stocking or Garment 20-30 mm/Hg pressure to: - Left leg apply Jobst compression stocking. Apply in the morning and remove at night. Additional Orders / Instructions: Follow Nutritious Diet WOUND #1: - Lower Leg Wound Laterality: Right, Anterior Cleanser: Soap and Water 1 x Per Week/30 Days Discharge Instructions: May shower and wash wound with dial antibacterial soap and water prior to dressing change. Cleanser: Wound Cleanser 1 x Per Week/30 Days Discharge Instructions: Cleanse the wound with wound cleanser prior to applying a clean dressing using gauze sponges, not tissue or cotton balls. Peri-Wound Care: Triamcinolone 15 (g) 1 x Per Week/30 Days Discharge Instructions: Apply liberally to wounds and periwound. Use triamcinolone 15 (g) as directed Peri-Wound Care: Sween Lotion (Moisturizing lotion) 1 x Per Week/30 Days Discharge Instructions: Apply moisturizing lotion as directed Topical: Triamcinolone 1 x Per Week/30 Days Discharge Instructions: Apply Triamcinolone to wounds. Prim Dressing: Hydrofera Blue Classic Foam, 4x4 in 1 x Per Week/30 Days ary Discharge Instructions: Moisten with saline prior to applying to wound bed Secondary Dressing: Woven Gauze Sponge, Non-Sterile 4x4 in 1 x Per Week/30 Days Discharge Instructions: Apply over primary dressing as directed. Com pression Wrap: ThreePress (3 layer compression wrap) 1 x Per Week/30 Days Discharge Instructions: Apply three layer compression as directed. Com pression Stockings: Jobst Farrow Wrap 4000 (DME) Compression Amount: 30-40 mmHg (right) Discharge Instructions: Apply Francia Greaves daily as instructed. Apply first thing in the morning, remove at night before bed. WOUND #5: - Lower Leg  Wound Laterality: Right, Lateral Cleanser: Soap and Water 1 x Per Week/30 Days Discharge Instructions: May shower and wash wound with dial antibacterial soap and water prior to dressing change. Cleanser: Wound Cleanser 1 x Per Week/30 Days Discharge Instructions: Cleanse the wound with wound cleanser prior to applying a clean dressing using gauze sponges, not tissue or cotton balls. Peri-Wound Care: Triamcinolone 15 (g) 1 x Per Week/30 Days Discharge Instructions: Apply liberally to wounds and periwound. Use triamcinolone 15 (g) as directed Peri-Wound  Care: Sween Lotion (Moisturizing lotion) 1 x Per Week/30 Days Discharge Instructions: Apply moisturizing lotion as directed Topical: Triamcinolone 1 x Per Week/30 Days Discharge Instructions: Apply Triamcinolone to wounds. Prim Dressing: Hydrofera Blue Classic Foam, 4x4 in 1 x Per Week/30 Days ary Discharge Instructions: Moisten with saline prior to applying to wound bed Secondary Dressing: Woven Gauze Sponge, Non-Sterile 4x4 in 1 x Per Week/30 Days Discharge Instructions: Apply over primary dressing as directed. Com pression Wrap: ThreePress (3 layer compression wrap) 1 x Per Week/30 Days Discharge Instructions: Apply three layer compression as directed. #1 we continued with Hydrofera Blue to both areas on the right under compression #2 she has a compression in stocking and waiting Electronic Signature(s) Signed: 11/16/2020 10:33:35 AM By: Linton Ham MD Entered By: Linton Ham on 11/16/2020 10:32:57 -------------------------------------------------------------------------------- SuperBill Details Patient Name: Date of Service: Leah [name], SUZA NNE [name]. 11/15/2020 Medical Record Number: [id number] Patient Account Number: [id number] Date of Birth/Sex: Treating Leah [name]: [birth year] (87 y.o. Leah [name] Primary Care Provider: Pricilla Holm Other Clinician: Referring Provider: Treating Provider/Extender: Charlie Pitter in Treatment: 6 Diagnosis Coding ICD-10 Codes Code Description [phone] Chronic venous hypertension (idiopathic) with ulcer and inflammation of bilateral lower extremity L97.818 Non-pressure chronic ulcer of other part of right lower leg with other specified severity Facility Procedures CPT4 Code: [id number] Description: [phone] - DEBRIDE WOUND 1ST 20 SQ CM OR < ICD-10 Diagnosis Description L97.818 Non-pressure chronic ulcer of other part of right lower leg with other specified s Modifier: everity Quantity: 1 Physician Procedures : CPT4 Code Description Modifier [id number] - WC PHYS DEBR WO ANESTH 20 SQ CM ICD-10 Diagnosis Description L97.818 Non-pressure chronic ulcer of other part of right lower leg with other specified severity Quantity: 1 Electronic Signature(s) Signed: 11/16/2020 10:33:35 AM By: Linton Ham MD Previous Signature: 11/15/2020 5:31:56 PM Version By: Deon Pilling Entered By: Linton Ham on 11/16/2020 10:33:08

## 2020-11-22 ENCOUNTER — Other Ambulatory Visit: Payer: Self-pay

## 2020-11-22 ENCOUNTER — Encounter (HOSPITAL_BASED_OUTPATIENT_CLINIC_OR_DEPARTMENT_OTHER): Payer: Medicare PPO | Admitting: Internal Medicine

## 2020-11-22 DIAGNOSIS — N183 Chronic kidney disease, stage 3 unspecified: Secondary | ICD-10-CM | POA: Diagnosis not present

## 2020-11-22 DIAGNOSIS — L97812 Non-pressure chronic ulcer of other part of right lower leg with fat layer exposed: Secondary | ICD-10-CM | POA: Diagnosis not present

## 2020-11-22 DIAGNOSIS — L97818 Non-pressure chronic ulcer of other part of right lower leg with other specified severity: Secondary | ICD-10-CM | POA: Diagnosis not present

## 2020-11-22 DIAGNOSIS — I872 Venous insufficiency (chronic) (peripheral): Secondary | ICD-10-CM | POA: Diagnosis not present

## 2020-11-22 DIAGNOSIS — I87333 Chronic venous hypertension (idiopathic) with ulcer and inflammation of bilateral lower extremity: Secondary | ICD-10-CM | POA: Diagnosis not present

## 2020-11-22 DIAGNOSIS — I890 Lymphedema, not elsewhere classified: Secondary | ICD-10-CM | POA: Diagnosis not present

## 2020-11-22 NOTE — Progress Notes (Signed)
Leah, Leah [name] ([id number]) . Visit Report for 11/22/2020 Debridement Details Patient Name: Date of Service: Leah [name]. 11/22/2020 3:00 PM Medical Record Number: [id number] Patient Account Number: [id number] Date of Birth/Sex: Treating RN: 03-20-1930 (87 y.o. Leah [name] Primary Care Provider: Pricilla Holm Other Clinician: Referring Provider: Treating Provider/Extender: Charlie Pitter in Treatment: 7 Debridement Performed for Assessment: Wound #5 Right,Lateral Lower Leg Performed By: Physician Ricard Dillon., MD Debridement Type: Debridement Level of Consciousness (Pre-procedure): Awake and Alert Pre-procedure Verification/Time Out Yes - 15:35 Taken: Start Time: 15:36 Pain Control: Lidocaine 4% [name] opical Solution [name] Area Debrided (L x W): otal 0.5 (cm) x 0.4 (cm) = 0.2 (cm) Tissue and other material debrided: Viable, Slough, Skin: Dermis , Skin: Epidermis, Slough Level: Skin/Epidermis Debridement Description: Selective/Open Wound Instrument: Curette Bleeding: Minimum Hemostasis Achieved: Pressure End Time: 15:40 Procedural Pain: 0 Post Procedural Pain: 0 Response to Treatment: Procedure was tolerated well Level of Consciousness (Post- Awake and Alert procedure): Post Debridement Measurements of Total Wound Length: (cm) 0.5 Width: (cm) 0.4 Depth: (cm) 0.1 Volume: (cm) 0.016 Character of Wound/Ulcer Post Debridement: Improved Post Procedure Diagnosis Same as Pre-procedure Electronic Signature(s) Signed: 11/22/2020 5:20:02 PM By: Linton Ham MD Signed: 11/22/2020 5:46:50 PM By: Deon Pilling Entered By: Deon Pilling on 11/22/2020 15:42:51 -------------------------------------------------------------------------------- Debridement Details Patient Name: Date of Service: Leah [name], Leah [name]. 11/22/2020 3:00 PM Medical Record Number: [id number] Patient Account Number: [id number] Date of Birth/Sex: Treating  RN: 1930-03-13 (87 y.o. Leah [name], Leah [name] Primary Care Provider: Pricilla Holm Other Clinician: Referring Provider: Treating Provider/Extender: Charlie Pitter in Treatment: 7 Debridement Performed for Assessment: Wound #1 Right,Anterior Lower Leg Performed By: Physician Ricard Dillon., MD Debridement Type: Debridement Severity of Tissue Pre Debridement: Fat layer exposed Level of Consciousness (Pre-procedure): Awake and Alert Pre-procedure Verification/Time Out Yes - 15:35 Taken: Start Time: 15:36 Pain Control: Lidocaine 4% [name] opical Solution [name] Area Debrided (L x W): otal 0.4 (cm) x 0.2 (cm) = 0.08 (cm) Tissue and other material debrided: Viable, Skin: Dermis , Skin: Epidermis Level: Skin/Epidermis Debridement Description: Selective/Open Wound Instrument: Curette Bleeding: Minimum Hemostasis Achieved: Pressure End Time: 15:40 Procedural Pain: 0 Post Procedural Pain: 0 Response to Treatment: Procedure was tolerated well Level of Consciousness (Post- Awake and Alert procedure): Post Debridement Measurements of Total Wound Length: (cm) 0.4 Width: (cm) 0.2 Depth: (cm) 0.1 Volume: (cm) 0.006 Character of Wound/Ulcer Post Debridement: Improved Severity of Tissue Post Debridement: Fat layer exposed Post Procedure Diagnosis Same as Pre-procedure Electronic Signature(s) Signed: 11/22/2020 5:20:02 PM By: Linton Ham MD Signed: 11/22/2020 5:46:50 PM By: Deon Pilling Entered By: Linton Ham on 11/22/2020 15:43:20 -------------------------------------------------------------------------------- HPI Details Patient Name: Date of Service: Leah [name], Leah [name]. 11/22/2020 3:00 PM Medical Record Number: [id number] Patient Account Number: [id number] Date of Birth/Sex: Treating RN: Feb 13, 1930 (87 y.o. Leah [name] Primary Care Provider: Pricilla Holm Other Clinician: Referring Provider: Treating Provider/Extender: Charlie Pitter in Treatment: 7 History of Present Illness HPI Description: ADMISSION 10/04/2020; This is an 87 year old woman who lives independently. Her husband was actually a longstanding patient in this clinic who passed away recently. She states her problem began in January with increased swelling in her legs she has a large area on the right anterior lower leg and 3 smaller areas on the left. She has a history of chronic venous insufficiency. I note that she had DVT studies on 03/29/2020 that were negative for DVT She was in her primary  doctor's office late . in April and she was given Silvadene cream but she does not feel this has been helping. Recently she has been using Vaseline she has compression stockings but does not wear them reliably including juxta lites which belonged to her husband Past medical history includes chronic venous insufficiency, stage III chronic kidney disease, sciatica, peripheral edema, pelvic fractures ABIs in our clinic were normal 1.13 on the right and 1.06 on the left 5/12; arrives in clinic today with only 1 wound left on the left leg which is the posterior left calf. Still an extensive area on the right anterior lower leg. Our intake nurse reports malodorous purulent looking drainage. We have been using silver alginate under compression 3 layer 5/19 the left posterior calf is closed. Again I get description of this of the right anterior leg is having a lot of drainage and malodor although she only appears to have a cluster of small wounds which really do not appear to be infected. The PCR culture I did last week showed Pseudomonas and staph aureus. I put this forward to [facility] although I think I am just going to use gentamicin on this under compression. I gave her Keflex last week but even the staff was likely to be MRSA. 6/2; left posterior calf is closed right anterior leg has 3 or 4 small scattered wounds. I used a #3 curette to clean  up the surfaces of these. We have been using gentamicin I do not think that is going to be necessary. 6/9; her left posterior calf is remain closed and she has her stocking on. On the right anterior we have 2 open areas that I think are mostly epithelialized although they look vulnerable. For this reason I look towards putting her on another week of compression. She should be done by next week. We have been using Hydrofera Blue on the wound areas 6/16 and; patient has a stocking on her left leg . There are 2 open areas on the right anterior tibial area and just laterally to this area. These are small areas both with surface slough 6/23; still 2 open areas on the right anterior tibia and just lateral to this area. The problem is the base of the wound even under intense illumination looks like skin very difficult to tell. We have been using Hydrofera Blue I change this to silver collagen. She does not yet have a compression stocking for the right leg [Farrow wrap]. We are trying to organize this through prism Electronic Signature(s) Signed: 11/22/2020 5:20:02 PM By: Linton Ham MD Entered By: Linton Ham on 11/22/2020 15:44:39 -------------------------------------------------------------------------------- Physical Exam Details Patient Name: Date of Service: Leah [name]. 11/22/2020 3:00 PM Medical Record Number: [id number] Patient Account Number: [id number] Date of Birth/Sex: Treating RN: Nov 28, 1929 (87 y.o. Leah [name] Primary Care Provider: Pricilla Holm Other Clinician: Referring Provider: Treating Provider/Extender: Charlie Pitter in Treatment: 7 Constitutional Patient is hypertensive.. Pulse regular and within target range for patient.Marland Kitchen Respirations regular, non-labored and within target range.. Temperature is normal and within the target range for the patient.Marland Kitchen Appears in no distress. Notes Wound exam; the patient had a stocking on  her left leg. On the right side she has the 2 same areas. Surface of these wounds is very difficult to tell between surface slough and normal skin. Truthfully I think there is quite a bit of both. After debridement with a #3 curette there are 2 linear longitudinal lines I think that is all  that is left of these areas. There is no evidence of surrounding infection Electronic Signature(s) Signed: 11/22/2020 5:20:02 PM By: Linton Ham MD Entered By: Linton Ham on 11/22/2020 15:46:28 -------------------------------------------------------------------------------- Physician Orders Details Patient Name: Date of Service: Leah [name], Leah [name]. 11/22/2020 3:00 PM Medical Record Number: [id number] Patient Account Number: [id number] Date of Birth/Sex: Treating RN: Sep 05, 1929 (87 y.o. Leah [name], Leah [name] Primary Care Provider: Pricilla Holm Other Clinician: Referring Provider: Treating Provider/Extender: Charlie Pitter in Treatment: 7 Verbal / Phone Orders: No Diagnosis Coding ICD-10 Coding Code Description [phone] Chronic venous hypertension (idiopathic) with ulcer and inflammation of bilateral lower extremity L97.818 Non-pressure chronic ulcer of other part of right lower leg with other specified severity Follow-up Appointments ppointment in 1 week. - Dr. Dellia Nims Return A Bathing/ Shower/ Hygiene May shower with protection but do not get wound dressing(s) wet. - use a cast protector. Edema Control - Lymphedema / SCD / Other Elevate legs to the level of the heart or above for 30 minutes daily and/or when sitting, a frequency of: - throughout the day. Avoid standing for long periods of time. Exercise regularly Moisturize legs daily. - apply lotion every night before bed to left leg. Compression stocking or Garment 20-30 mm/Hg pressure to: - Left leg apply Jobst compression stocking. Apply in the morning and remove at night. Additional Orders /  Instructions Follow Nutritious Diet Other: - ****Patient to call [employer] to purchase compression Farrow Wrap 4000.**** Wound Treatment Wound #1 - Lower Leg Wound Laterality: Right, Anterior Cleanser: Soap and Water 1 x Per Week/30 Days Discharge Instructions: May shower and wash wound with dial antibacterial soap and water prior to dressing change. Cleanser: Wound Cleanser 1 x Per Week/30 Days Discharge Instructions: Cleanse the wound with wound cleanser prior to applying a clean dressing using gauze sponges, not tissue or cotton balls. Peri-Wound Care: Triamcinolone 15 (g) 1 x Per Week/30 Days Discharge Instructions: Apply liberally to wounds and periwound. Use triamcinolone 15 (g) as directed Peri-Wound Care: Sween Lotion (Moisturizing lotion) 1 x Per Week/30 Days Discharge Instructions: Apply moisturizing lotion as directed Prim Dressing: Promogran Prisma Matrix, 4.34 (sq in) (silver collagen) 1 x Per Week/30 Days ary Discharge Instructions: Moisten collagen with saline or hydrogel Secondary Dressing: Woven Gauze Sponge, Non-Sterile 4x4 in 1 x Per Week/30 Days Discharge Instructions: Apply over primary dressing as directed. Compression Wrap: ThreePress (3 layer compression wrap) 1 x Per Week/30 Days Discharge Instructions: Apply three layer compression as directed. Compression Stockings: Jobst Farrow Wrap 4000 Right Leg Compression Amount: 30-40 mmHG Discharge Instructions: Apply Francia Greaves daily as instructed. Apply first thing in the morning, remove at night before bed. Wound #5 - Lower Leg Wound Laterality: Right, Lateral Cleanser: Soap and Water 1 x Per Week/30 Days Discharge Instructions: May shower and wash wound with dial antibacterial soap and water prior to dressing change. Cleanser: Wound Cleanser 1 x Per Week/30 Days Discharge Instructions: Cleanse the wound with wound cleanser prior to applying a clean dressing using gauze sponges, not tissue or cotton balls. Peri-Wound  Care: Triamcinolone 15 (g) 1 x Per Week/30 Days Discharge Instructions: Apply liberally to wounds and periwound. Use triamcinolone 15 (g) as directed Peri-Wound Care: Sween Lotion (Moisturizing lotion) 1 x Per Week/30 Days Discharge Instructions: Apply moisturizing lotion as directed Prim Dressing: Promogran Prisma Matrix, 4.34 (sq in) (silver collagen) 1 x Per Week/30 Days ary Discharge Instructions: Moisten collagen with saline or hydrogel Secondary Dressing: Woven Gauze Sponge, Non-Sterile 4x4 in 1 x Per Week/30  Days Discharge Instructions: Apply over primary dressing as directed. Compression Wrap: ThreePress (3 layer compression wrap) 1 x Per Week/30 Days Discharge Instructions: Apply three layer compression as directed. Electronic Signature(s) Signed: 11/22/2020 5:20:02 PM By: Linton Ham MD Signed: 11/22/2020 5:46:50 PM By: Deon Pilling Entered By: Deon Pilling on 11/22/2020 15:39:32 -------------------------------------------------------------------------------- Problem List Details Patient Name: Date of Service: Leah [name], Leah [name]. 11/22/2020 3:00 PM Medical Record Number: [id number] Patient Account Number: [id number] Date of Birth/Sex: Treating RN: 07-08-1929 (87 y.o. 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 11/22/2020 3:00 PM Medical Record Number: [id number] Patient Account Number: [id number] Date of Birth/Sex: Treating RN: Oct 29, 1929 (87 y.o. Leah [name] Primary Care Provider: Pricilla Holm Other Clinician: Referring Provider: Treating Provider/Extender: Charlie Pitter in Treatment: 7 Subjective History of Present Illness (HPI) ADMISSION 10/04/2020; This is an 87 year old woman who lives independently. Her husband was actually a longstanding patient in this clinic who passed away recently. She states her problem began in January with increased swelling in her legs she has a large area on the right anterior lower leg and 3 smaller areas on the left. She has a history of chronic venous insufficiency. I note that she had DVT studies on 03/29/2020 that were negative for DVT She was in her primary doctor's office late . in April and she was given Silvadene cream but she does not feel this has been helping. Recently she has been using Vaseline she has compression stockings but does not wear them reliably including juxta lites which belonged to her husband Past medical history includes chronic venous insufficiency, stage III chronic kidney disease, sciatica, peripheral edema, pelvic fractures ABIs in our clinic were normal 1.13 on the right and 1.06 on the left 5/12; arrives in clinic today with only 1 wound left on the left leg which is the posterior left calf. Still an extensive area on the right anterior lower leg. Our intake nurse reports malodorous purulent looking drainage. We have been using silver alginate under compression 3  layer 5/19 the left posterior calf is closed. Again I get description of this of the right anterior leg is having a lot of drainage and malodor although she only appears to have a cluster of small wounds which really do not appear to be infected. The PCR culture I did last week showed Pseudomonas and staph aureus. I put this forward to [facility] although I think I am just going to use gentamicin on this under compression. I gave her Keflex last week but even the staff was likely to be MRSA. 6/2; left posterior calf is closed right anterior leg has 3 or 4 small scattered wounds. I used a #3 curette to clean up the surfaces of these. We have been using gentamicin  I do not think that is going to be necessary. 6/9; her left posterior calf is remain closed and she has her stocking on. On the right anterior we have 2 open areas that I think are mostly epithelialized although they look vulnerable. For this reason I look towards putting her on another week of compression. She should be done by next week. We have been using Hydrofera Blue on the wound areas 6/16 and; patient has a stocking on her left leg . There are 2 open areas on the right anterior tibial area and just laterally to this area. These are small areas both with surface slough 6/23; still 2 open areas on the right anterior tibia and just lateral to this area. The problem is the base of the wound even under intense illumination looks like skin very difficult to tell. We have been using Hydrofera Blue I change this to silver collagen. She does not yet have a compression stocking for the right leg [Farrow wrap]. We are trying to organize this through prism Objective Constitutional Patient is hypertensive.. Pulse regular and within target range for patient.Marland Kitchen Respirations regular, non-labored and within target range.. Temperature is normal and within the target range for the patient.Marland Kitchen Appears in no distress. Vitals Time Taken: 3:15 PM, Height: 60  in, Weight: 131 lbs, BMI: 25.6, Temperature: 97.6 F, Pulse: 80 bpm, Respiratory Rate: 20 breaths/min, Blood Pressure: 179/84 mmHg. General Notes: Wound exam; the patient had a stocking on her left leg. On the right side she has the 2 same areas. Surface of these wounds is very difficult to tell between surface slough and normal skin. Truthfully I think there is quite a bit of both. After debridement with a #3 curette there are 2 linear longitudinal lines I think that is all that is left of these areas. There is no evidence of surrounding infection Integumentary (Hair, Skin) Wound #1 status is Open. Original cause of wound was Gradually Appeared. The date acquired was: 06/02/2020. The wound has been in treatment 7 weeks. The wound is located on the Right,Anterior Lower Leg. The wound measures 0.4cm length x 0.2cm width x 0.1cm depth; 0.063cm^2 area and 0.006cm^3 volume. There is Fat Layer (Subcutaneous Tissue) exposed. There is no tunneling or undermining noted. There is a medium amount of serosanguineous drainage noted. The wound margin is distinct with the outline attached to the wound base. There is medium (34-66%) pink, pale granulation within the wound bed. There is a medium (34-66%) amount of necrotic tissue within the wound bed including Adherent Slough. Wound #5 status is Open. Original cause of wound was Gradually Appeared. The date acquired was: 11/15/2020. The wound has been in treatment 1 weeks. The wound is located on the Right,Lateral Lower Leg. The wound measures 0.5cm length x 0.4cm width x 0.1cm depth; 0.157cm^2 area and 0.016cm^3 volume. There is Fat Layer (Subcutaneous Tissue) exposed. There is no tunneling or undermining noted. There is a medium amount of serosanguineous drainage noted. The wound margin is distinct with the outline attached to the wound base. There is medium (34-66%) red granulation within the wound bed. There is a medium (34-66%) amount of necrotic tissue within the  wound bed including Adherent Slough. Assessment Active Problems ICD-10 Chronic venous hypertension (idiopathic) with ulcer and inflammation of bilateral lower extremity Non-pressure chronic ulcer of other part of right lower leg with other specified severity Procedures Wound #1 Pre-procedure diagnosis of Wound #1 is a Venous Leg Ulcer located on the Right,Anterior Lower Leg .Severity of Tissue  Pre Debridement is: Fat layer exposed. There was a Selective/Open Wound Skin/Epidermis Debridement with a total area of 0.08 sq cm performed by Ricard Dillon., MD. With the following instrument(s): Curette to remove Viable tissue/material. Material removed includes Skin: Dermis and Skin: Epidermis and after achieving pain control using Lidocaine 4% [name] opical Solution. A time out was conducted at 15:35, prior to the start of the procedure. A Minimum amount of bleeding was controlled with Pressure. The procedure was tolerated well with a pain level of 0 throughout and a pain level of 0 following the procedure. Post Debridement Measurements: 0.4cm length x 0.2cm width x 0.1cm depth; 0.006cm^3 volume. Character of Wound/Ulcer Post Debridement is improved. Severity of Tissue Post Debridement is: Fat layer exposed. Post procedure Diagnosis Wound #1: Same as Pre-Procedure Pre-procedure diagnosis of Wound #1 is a Venous Leg Ulcer located on the Right,Anterior Lower Leg . There was a Three Layer Compression Therapy Procedure by Baruch Gouty, RN. Post procedure Diagnosis Wound #1: Same as Pre-Procedure Wound #5 Pre-procedure diagnosis of Wound #5 is a Lymphedema located on the Right,Lateral Lower Leg . There was a Selective/Open Wound Skin/Epidermis Debridement with a total area of 0.2 sq cm performed by Ricard Dillon., MD. With the following instrument(s): Curette to remove Viable tissue/material. Material removed includes Slough, Skin: Dermis, and Skin: Epidermis after achieving pain control using  Lidocaine 4% [name] opical Solution. A time out was conducted at 15:35, prior to the start of the procedure. A Minimum amount of bleeding was controlled with Pressure. The procedure was tolerated well with a pain level of 0 throughout and a pain level of 0 following the procedure. Post Debridement Measurements: 0.5cm length x 0.4cm width x 0.1cm depth; 0.016cm^3 volume. Character of Wound/Ulcer Post Debridement is improved. Post procedure Diagnosis Wound #5: Same as Pre-Procedure Pre-procedure diagnosis of Wound #5 is a Lymphedema located on the Right,Lateral Lower Leg . There was a Three Layer Compression Therapy Procedure by Baruch Gouty, RN. Post procedure Diagnosis Wound #5: Same as Pre-Procedure Plan Follow-up Appointments: Return Appointment in 1 week. - Dr. Dellia Nims Bathing/ Shower/ Hygiene: May shower with protection but do not get wound dressing(s) wet. - use a cast protector. Edema Control - Lymphedema / SCD / Other: Elevate legs to the level of the heart or above for 30 minutes daily and/or when sitting, a frequency of: - throughout the day. Avoid standing for long periods of time. Exercise regularly Moisturize legs daily. - apply lotion every night before bed to left leg. Compression stocking or Garment 20-30 mm/Hg pressure to: - Left leg apply Jobst compression stocking. Apply in the morning and remove at night. Additional Orders / Instructions: Follow Nutritious Diet Other: - ****Patient to call [employer] to purchase compression Farrow Wrap 4000.**** WOUND #1: - Lower Leg Wound Laterality: Right, Anterior Cleanser: Soap and Water 1 x Per Week/30 Days Discharge Instructions: May shower and wash wound with dial antibacterial soap and water prior to dressing change. Cleanser: Wound Cleanser 1 x Per Week/30 Days Discharge Instructions: Cleanse the wound with wound cleanser prior to applying a clean dressing using gauze sponges, not tissue or cotton balls. Peri-Wound Care: Triamcinolone  15 (g) 1 x Per Week/30 Days Discharge Instructions: Apply liberally to wounds and periwound. Use triamcinolone 15 (g) as directed Peri-Wound Care: Sween Lotion (Moisturizing lotion) 1 x Per Week/30 Days Discharge Instructions: Apply moisturizing lotion as directed Prim Dressing: Promogran Prisma Matrix, 4.34 (sq in) (silver collagen) 1 x Per Week/30 Days ary Discharge Instructions: Moisten  collagen with saline or hydrogel Secondary Dressing: Woven Gauze Sponge, Non-Sterile 4x4 in 1 x Per Week/30 Days Discharge Instructions: Apply over primary dressing as directed. Com pression Wrap: ThreePress (3 layer compression wrap) 1 x Per Week/30 Days Discharge Instructions: Apply three layer compression as directed. Com pression Stockings: Jobst Farrow Wrap 4000 Compression Amount: 30-40 mmHg (right) Discharge Instructions: Apply Francia Greaves daily as instructed. Apply first thing in the morning, remove at night before bed. WOUND #5: - Lower Leg Wound Laterality: Right, Lateral Cleanser: Soap and Water 1 x Per Week/30 Days Discharge Instructions: May shower and wash wound with dial antibacterial soap and water prior to dressing change. Cleanser: Wound Cleanser 1 x Per Week/30 Days Discharge Instructions: Cleanse the wound with wound cleanser prior to applying a clean dressing using gauze sponges, not tissue or cotton balls. Peri-Wound Care: Triamcinolone 15 (g) 1 x Per Week/30 Days Discharge Instructions: Apply liberally to wounds and periwound. Use triamcinolone 15 (g) as directed Peri-Wound Care: Sween Lotion (Moisturizing lotion) 1 x Per Week/30 Days Discharge Instructions: Apply moisturizing lotion as directed Prim Dressing: Promogran Prisma Matrix, 4.34 (sq in) (silver collagen) 1 x Per Week/30 Days ary Discharge Instructions: Moisten collagen with saline or hydrogel Secondary Dressing: Woven Gauze Sponge, Non-Sterile 4x4 in 1 x Per Week/30 Days Discharge Instructions: Apply over primary  dressing as directed. Com pression Wrap: ThreePress (3 layer compression wrap) 1 x Per Week/30 Days Discharge Instructions: Apply three layer compression as directed. 1. I went back to silver collagen under compression hopefully to get these areas to close they are very close 2. Awaiting for an additional external compression stocking for the right leg Electronic Signature(s) Signed: 11/22/2020 5:20:02 PM By: Linton Ham MD Entered By: Linton Ham on 11/22/2020 15:47:04 -------------------------------------------------------------------------------- SuperBill Details Patient Name: Date of Service: Leah [name], Leah [name]. 11/22/2020 Medical Record Number: [id number] Patient Account Number: [id number] Date of Birth/Sex: Treating RN: Sep 07, 1929 (87 y.o. Leah [name] Primary Care Provider: Pricilla Holm Other Clinician: Referring Provider: Treating Provider/Extender: Charlie Pitter in Treatment: 7 Diagnosis Coding ICD-10 Codes Code Description [phone] Chronic venous hypertension (idiopathic) with ulcer and inflammation of bilateral lower extremity L97.818 Non-pressure chronic ulcer of other part of right lower leg with other specified severity Facility Procedures CPT4 Code: [id number] Description: [phone] - DEBRIDE WOUND 1ST 20 SQ CM OR < ICD-10 Diagnosis Description L97.818 Non-pressure chronic ulcer of other part of right lower leg with other specified s Modifier: everity Quantity: 1 Physician Procedures : CPT4 Code Description Modifier [id number] - WC PHYS DEBR WO ANESTH 20 SQ CM ICD-10 Diagnosis Description L97.818 Non-pressure chronic ulcer of other part of right lower leg with other specified severity Quantity: 1 Electronic Signature(s) Signed: 11/22/2020 5:20:02 PM By: Linton Ham MD Entered By: Linton Ham on 11/22/2020 15:47:20

## 2020-11-22 NOTE — Progress Notes (Signed)
SUKI, CROCKETT T ([id number]) . Visit Report for 11/22/2020 Arrival Information Details Patient Name: Date of Service: Carlisle Cater NNE T. 11/22/2020 3:00 PM Medical Record Number: [id number] Patient Account Number: [id number] Date of Birth/Sex: Treating RN: 01-24-1930 (87 y.o. [name], Meta.Reding Primary Care Rosemaria Inabinet: Pricilla Holm Other Clinician: Referring Osa Campoli: Treating Stanley Lyness/Extender: Charlie Pitter in Treatment: 7 Visit Information History Since Last Visit Added or deleted any medications: No Patient Arrived: Kasandra Knudsen Any new allergies or adverse reactions: No Arrival Time: 15:15 Had a fall or experienced change in No Accompanied By: self activities of daily living that may affect Transfer Assistance: None risk of falls: Patient Identification Verified: Yes Signs or symptoms of abuse/neglect since last visito No Secondary Verification Process Completed: Yes Hospitalized since last visit: No Patient Requires Transmission-Based Precautions: No Implantable device outside of the clinic excluding No Patient Has Alerts: No cellular tissue based products placed in the center since last visit: Has Dressing in Place as Prescribed: Yes Has Compression in Place as Prescribed: Yes Pain Present Now: No Electronic Signature(s) Signed: 11/22/2020 5:46:50 PM By: Deon Pilling Entered By: Deon Pilling on 11/22/2020 15:28:44 -------------------------------------------------------------------------------- Compression Therapy Details Patient Name: Date of Service: Carlisle Cater NNE T. 11/22/2020 3:00 PM Medical Record Number: [id number] Patient Account Number: [id number] Date of Birth/Sex: Treating RN: 1929-10-31 (87 y.o. [name] Primary Care Aliveah Gallant: Pricilla Holm Other Clinician: Referring Cherell Colvin: Treating Jaycee Pelzer/Extender: Charlie Pitter in Treatment: 7 Compression Therapy Performed for Wound  Assessment: Wound #1 Right,Anterior Lower Leg Performed By: Clinician Baruch Gouty, RN Compression Type: Three Layer Post Procedure Diagnosis Same as Pre-procedure Electronic Signature(s) Signed: 11/22/2020 5:46:50 PM By: Deon Pilling Entered By: Deon Pilling on 11/22/2020 15:36:13 -------------------------------------------------------------------------------- Compression Therapy Details Patient Name: Date of Service: Carlisle Cater NNE T. 11/22/2020 3:00 PM Medical Record Number: [id number] Patient Account Number: [id number] Date of Birth/Sex: Treating RN: 08-11-1929 (87 y.o. [name] Primary Care Tilia Faso: Pricilla Holm Other Clinician: Referring Safir Michalec: Treating Leanza Shepperson/Extender: Charlie Pitter in Treatment: 7 Compression Therapy Performed for Wound Assessment: Wound #5 Right,Lateral Lower Leg Performed By: Clinician Baruch Gouty, RN Compression Type: Three Layer Post Procedure Diagnosis Same as Pre-procedure Electronic Signature(s) Signed: 11/22/2020 5:46:50 PM By: Deon Pilling Entered By: Deon Pilling on 11/22/2020 15:36:13 -------------------------------------------------------------------------------- Encounter Discharge Information Details Patient Name: Date of Service: Alben Spittle, SUZA NNE T. 11/22/2020 3:00 PM Medical Record Number: [id number] Patient Account Number: [id number] Date of Birth/Sex: Treating RN: 08-15-1929 (87 y.o. Elam Dutch Primary Care Parys Elenbaas: Pricilla Holm Other Clinician: Referring Idara Woodside: Treating Jaquan Sadowsky/Extender: Charlie Pitter in Treatment: 7 Encounter Discharge Information Items Post Procedure Vitals Discharge Condition: Stable Temperature (F): 97.6 Ambulatory Status: Cane Pulse (bpm): 80 Discharge Destination: Home Respiratory Rate (breaths/min): 18 Transportation: Private Auto Blood Pressure (mmHg): 179/84 Accompanied By:  self Schedule Follow-up Appointment: Yes Clinical Summary of Care: Patient Declined Electronic Signature(s) Signed: 11/22/2020 5:41:44 PM By: Baruch Gouty RN, BSN Entered By: Baruch Gouty on 11/22/2020 16:00:25 -------------------------------------------------------------------------------- Lower Extremity Assessment Details Patient Name: Date of Service: Alben Spittle, Asa Saunas NNE T. 11/22/2020 3:00 PM Medical Record Number: [id number] Patient Account Number: [id number] Date of Birth/Sex: Treating RN: Apr 16, 1930 (87 y.o. [name] Primary Care Demoni Gergen: Pricilla Holm Other Clinician: Referring Madaline Lefeber: Treating Amyjo Mizrachi/Extender: Charlie Pitter in Treatment: 7 Edema Assessment Assessed: [Left: No] [Right: Yes] Edema: [Left: Ye] [Right: s] Calf Left: Right: Point of Measurement: 29 cm From Medial Instep 30.8 cm Ankle Left: Right: Point  of Measurement: 10 cm From Medial Instep 19 cm Vascular Assessment Pulses: Dorsalis Pedis Palpable: [Right:Yes] Electronic Signature(s) Signed: 11/22/2020 5:46:50 PM By: Deon Pilling Entered By: Deon Pilling on 11/22/2020 15:29:28 -------------------------------------------------------------------------------- Multi Wound Chart Details Patient Name: Date of Service: Alben Spittle, Asa Saunas NNE T. 11/22/2020 3:00 PM Medical Record Number: [id number] Patient Account Number: [id number] Date of Birth/Sex: Treating RN: [birth year] (87 y.o. [name], [name] Primary Care Breea Loncar: Pricilla Holm Other Clinician: Referring Feven Alderfer: Treating Felesia Stahlecker/Extender: Charlie Pitter in Treatment: 7 Vital Signs Height(in): 60 Pulse(bpm): 80 Weight(lbs): 131 Blood Pressure(mmHg): 179/84 Body Mass Index(BMI): 26 Temperature(F): 97.6 Respiratory Rate(breaths/min): 20 Photos: [1:No Photos Right, Anterior Lower Leg] [5:No Photos Right, Lateral Lower Leg] [N/A:N/A N/A] Wound Location:  [1:Gradually Appeared] [5:Gradually Appeared] [N/A:N/A] Wounding Event: [1:Venous Leg Ulcer] [5:Lymphedema] [N/A:N/A] Primary Etiology: [1:Peripheral Venous Disease,] [5:Peripheral Venous Disease,] [N/A:N/A] Comorbid History: [1:Osteoarthritis 06/02/2020] [5:Osteoarthritis 11/15/2020] [N/A:N/A] Date Acquired: [1:7] [5:1] [N/A:N/A] Weeks of Treatment: [1:Open] [5:Open] [N/A:N/A] Wound Status: [1:Yes] [5:No] [N/A:N/A] Clustered Wound: [1:0.4x0.2x0.1] [5:0.5x0.4x0.1] [N/A:N/A] Measurements L x W x D (cm) [1:0.063] [5:0.157] [N/A:N/A] A (cm) : rea [1:0.006] [5:0.016] [N/A:N/A] Volume (cm) : [1:100.00%] [5:-33.10%] [N/A:N/A] % Reduction in Area: [1:100.00%] [5:-33.30%] [N/A:N/A] % Reduction in Volume: [1:Full Thickness Without Exposed] [5:Full Thickness Without Exposed] [N/A:N/A] Classification: [1:Support Structures Medium] [5:Support Structures Medium] [N/A:N/A] Exudate Amount: [1:Serosanguineous] [5:Serosanguineous] [N/A:N/A] Exudate Type: [1:red, brown] [5:red, brown] [N/A:N/A] Exudate Color: [1:Distinct, outline attached] [5:Distinct, outline attached] [N/A:N/A] Wound Margin: [1:Medium (34-66%)] [5:Medium (34-66%)] [N/A:N/A] Granulation Amount: [1:Pink, Pale] [5:Red] [N/A:N/A] Granulation Quality: [1:Medium (34-66%)] [5:Medium (34-66%)] [N/A:N/A] Necrotic Amount: [1:Fat Layer (Subcutaneous Tissue): Yes Fat Layer (Subcutaneous Tissue): Yes N/A] Exposed Structures: [1:Fascia: No Tendon: No Muscle: No Joint: No Bone: No Large (67-100%)] [5:Fascia: No Tendon: No Muscle: No Joint: No Bone: No Small (1-33%)] [N/A:N/A] Epithelialization: [1:Debridement - Selective/Open Wound] [5:N/A] [N/A:N/A] Debridement: Pre-procedure Verification/Time Out 15:35 [5:N/A] [N/A:N/A] Taken: [1:Lidocaine 4% Topical Solution] [5:N/A] [N/A:N/A] Pain Control: [1:Skin/Epidermis] [5:N/A] [N/A:N/A] Level: [1:0.08] [5:N/A] [N/A:N/A] Debridement A (sq cm): [1:rea Curette] [5:N/A] [N/A:N/A] Instrument: [1:Minimum]  [5:N/A] [N/A:N/A] Bleeding: [1:Pressure] [5:N/A] [N/A:N/A] Hemostasis A chieved: [1:0] [5:N/A] [N/A:N/A] Procedural Pain: [1:0] [5:N/A] [N/A:N/A] Post Procedural Pain: [1:Procedure was tolerated well] [5:N/A] [N/A:N/A] Debridement Treatment Response: [1:0.4x0.2x0.1] [5:N/A] [N/A:N/A] Post Debridement Measurements L x W x D (cm) [1:0.006] [5:N/A] [N/A:N/A] Post Debridement Volume: (cm) [1:Compression Therapy] [5:Compression Therapy] [N/A:N/A] Procedures Performed: [1:Debridement] Treatment Notes Electronic Signature(s) Signed: 11/22/2020 5:20:02 PM By: Linton Ham MD Signed: 11/22/2020 5:46:50 PM By: Deon Pilling Entered By: Linton Ham on 11/22/2020 15:42:47 -------------------------------------------------------------------------------- Multi-Disciplinary Care Plan Details Patient Name: Date of Service: Alben Spittle, SUZA NNE T. 11/22/2020 3:00 PM Medical Record Number: [id number] Patient Account Number: [id number] Date of Birth/Sex: Treating RN: February 22, 1930 (87 y.o. [name], [name] Primary Care Mackinzie Vuncannon: Pricilla Holm Other Clinician: Referring Naftuli Dalsanto: Treating Caddie Randle/Extender: Charlie Pitter in Treatment: 7 Active Inactive Abuse / Safety / Falls / Self Care Management Nursing Diagnoses: Potential for falls Potential for injury related to falls Goals: Patient will remain injury free related to falls Date Initiated: 10/04/2020 Target Resolution Date: 12/07/2020 Goal Status: Active Patient/caregiver will verbalize understanding of skin care regimen Date Initiated: 10/04/2020 Target Resolution Date: 12/07/2020 Goal Status: Active Interventions: Assess fall risk on admission and as needed Assess: immobility, friction, shearing, incontinence upon admission and as needed Provide education on fall prevention Notes: Electronic Signature(s) Signed: 11/22/2020 5:46:50 PM By: Deon Pilling Entered By: Deon Pilling on 11/22/2020  15:31:12 -------------------------------------------------------------------------------- Pain Assessment Details Patient Name: Date of Service: KIO RPES, SUZA NNE  T. 11/22/2020 3:00 PM Medical Record Number: [id number] Patient Account Number: [id number] Date of Birth/Sex: Treating RN: Mar 03, 1930 (87 y.o. [name] Primary Care Birdell Frasier: Pricilla Holm Other Clinician: Referring Divine Hansley: Treating Jelitza Manninen/Extender: Charlie Pitter in Treatment: 7 Active Problems Location of Pain Severity and Description of Pain Patient Has Paino No Site Locations Rate the pain. Current Pain Level: 0 Pain Management and Medication Current Pain Management: Medication: No Cold Application: No Rest: No Massage: No Activity: No T.E.N.S.: No Heat Application: No Leg drop or elevation: No Is the Current Pain Management Adequate: Adequate How does your wound impact your activities of daily livingo Sleep: No Bathing: No Appetite: No Relationship With Others: No Bladder Continence: No Emotions: No Bowel Continence: No Work: No Toileting: No Drive: No Dressing: No Hobbies: No Electronic Signature(s) Signed: 11/22/2020 5:46:50 PM By: Deon Pilling Entered By: Deon Pilling on 11/22/2020 15:29:13 -------------------------------------------------------------------------------- Patient/Caregiver Education Details Patient Name: Date of Service: Sheliah Hatch 6/23/2022andnbsp3:00 PM Medical Record Number: [id number] Patient Account Number: [id number] Date of Birth/Gender: Treating RN: 1930/01/14 (87 y.o. [name] Primary Care Physician: Pricilla Holm Other Clinician: Referring Physician: Treating Physician/Extender: Charlie Pitter in Treatment: 7 Education Assessment Education Provided To: Patient Education Topics Provided Safety: Handouts: Personal Safety Methods: Explain/Verbal Responses:  Reinforcements needed Electronic Signature(s) Signed: 11/22/2020 5:46:50 PM By: Deon Pilling Entered By: Deon Pilling on 11/22/2020 15:31:25 -------------------------------------------------------------------------------- Wound Assessment Details Patient Name: Date of Service: Carlisle Cater NNE T. 11/22/2020 3:00 PM Medical Record Number: [id number] Patient Account Number: [id number] Date of Birth/Sex: Treating RN: 09/26/1929 (87 y.o. [name], Meta.Reding Primary Care Aizik Reh: Pricilla Holm Other Clinician: Referring Allea Kassner: Treating Leah Thornberry/Extender: Charlie Pitter in Treatment: 7 Wound Status Wound Number: 1 Primary Etiology: Venous Leg Ulcer Wound Location: Right, Anterior Lower Leg Wound Status: Open Wounding Event: Gradually Appeared Comorbid History: Peripheral Venous Disease, Osteoarthritis Date Acquired: 06/02/2020 Weeks Of Treatment: 7 Clustered Wound: Yes Photos Wound Measurements Length: (cm) 0.4 Width: (cm) 0.2 Depth: (cm) 0.1 Area: (cm) 0.063 Volume: (cm) 0.006 % Reduction in Area: 100% % Reduction in Volume: 100% Epithelialization: Large (67-100%) Tunneling: No Undermining: No Wound Description Classification: Full Thickness Without Exposed Support Structures Wound Margin: Distinct, outline attached Exudate Amount: Medium Exudate Type: Serosanguineous Exudate Color: red, brown Foul Odor After Cleansing: No Slough/Fibrino Yes Wound Bed Granulation Amount: Medium (34-66%) Exposed Structure Granulation Quality: Pink, Pale Fascia Exposed: No Necrotic Amount: Medium (34-66%) Fat Layer (Subcutaneous Tissue) Exposed: Yes Necrotic Quality: Adherent Slough Tendon Exposed: No Muscle Exposed: No Joint Exposed: No Bone Exposed: No Treatment Notes Wound #1 (Lower Leg) Wound Laterality: Right, Anterior Cleanser Soap and Water Discharge Instruction: May shower and wash wound with dial antibacterial soap and water prior to  dressing change. Wound Cleanser Discharge Instruction: Cleanse the wound with wound cleanser prior to applying a clean dressing using gauze sponges, not tissue or cotton balls. Peri-Wound Care Triamcinolone 15 (g) Discharge Instruction: Apply liberally to wounds and periwound. Use triamcinolone 15 (g) as directed Sween Lotion (Moisturizing lotion) Discharge Instruction: Apply moisturizing lotion as directed Topical Primary Dressing Promogran Prisma Matrix, 4.34 (sq in) (silver collagen) Discharge Instruction: Moisten collagen with saline or hydrogel Secondary Dressing Woven Gauze Sponge, Non-Sterile 4x4 in Discharge Instruction: Apply over primary dressing as directed. Secured With Compression Wrap ThreePress (3 layer compression wrap) Discharge Instruction: Apply three layer compression as directed. Compression Stockings Jobst Farrow Wrap 4000 Quantity: 1 Right Leg Compression Amount: 30-40 mmHg Discharge Instruction: Apply Wallie Char  Wrap daily as instructed. Apply first thing in the morning, remove at night before bed. Add-Ons Electronic Signature(s) Signed: 11/22/2020 4:12:25 PM By: Sandre Kitty Signed: 11/22/2020 5:46:50 PM By: Deon Pilling Entered By: Sandre Kitty on 11/22/2020 15:46:20 -------------------------------------------------------------------------------- Wound Assessment Details Patient Name: Date of Service: Alben Spittle, Asa Saunas NNE T. 11/22/2020 3:00 PM Medical Record Number: [id number] Patient Account Number: [id number] Date of Birth/Sex: Treating RN: 08/27/1929 (87 y.o. [name], Meta.Reding Primary Care Sherea Liptak: Pricilla Holm Other Clinician: Referring Bellamarie Pflug: Treating Maeci Kalbfleisch/Extender: Charlie Pitter in Treatment: 7 Wound Status Wound Number: 5 Primary Etiology: Lymphedema Wound Location: Right, Lateral Lower Leg Wound Status: Open Wounding Event: Gradually Appeared Comorbid History: Peripheral Venous Disease,  Osteoarthritis Date Acquired: 11/15/2020 Weeks Of Treatment: 1 Clustered Wound: No Photos Wound Measurements Length: (cm) 0.5 Width: (cm) 0.4 Depth: (cm) 0.1 Area: (cm) 0.157 Volume: (cm) 0.016 % Reduction in Area: -33.1% % Reduction in Volume: -33.3% Epithelialization: Small (1-33%) Tunneling: No Undermining: No Wound Description Classification: Full Thickness Without Exposed Support Structures Wound Margin: Distinct, outline attached Exudate Amount: Medium Exudate Type: Serosanguineous Exudate Color: red, brown Foul Odor After Cleansing: No Slough/Fibrino Yes Wound Bed Granulation Amount: Medium (34-66%) Exposed Structure Granulation Quality: Red Fascia Exposed: No Necrotic Amount: Medium (34-66%) Fat Layer (Subcutaneous Tissue) Exposed: Yes Necrotic Quality: Adherent Slough Tendon Exposed: No Muscle Exposed: No Joint Exposed: No Bone Exposed: No Treatment Notes Wound #5 (Lower Leg) Wound Laterality: Right, Lateral Cleanser Soap and Water Discharge Instruction: May shower and wash wound with dial antibacterial soap and water prior to dressing change. Wound Cleanser Discharge Instruction: Cleanse the wound with wound cleanser prior to applying a clean dressing using gauze sponges, not tissue or cotton balls. Peri-Wound Care Triamcinolone 15 (g) Discharge Instruction: Apply liberally to wounds and periwound. Use triamcinolone 15 (g) as directed Sween Lotion (Moisturizing lotion) Discharge Instruction: Apply moisturizing lotion as directed Topical Primary Dressing Promogran Prisma Matrix, 4.34 (sq in) (silver collagen) Discharge Instruction: Moisten collagen with saline or hydrogel Secondary Dressing Woven Gauze Sponge, Non-Sterile 4x4 in Discharge Instruction: Apply over primary dressing as directed. Secured With Compression Wrap ThreePress (3 layer compression wrap) Discharge Instruction: Apply three layer compression as directed. Compression  Stockings Add-Ons Electronic Signature(s) Signed: 11/22/2020 4:12:25 PM By: Sandre Kitty Signed: 11/22/2020 5:46:50 PM By: Deon Pilling Entered By: Sandre Kitty on 11/22/2020 15:46:33 -------------------------------------------------------------------------------- Vitals Details Patient Name: Date of Service: Alben Spittle, SUZA NNE T. 11/22/2020 3:00 PM Medical Record Number: [id number] Patient Account Number: [id number] Date of Birth/Sex: Treating RN: February 05, 1930 (87 y.o. [name], [name] Primary Care Dung Salinger: Pricilla Holm Other Clinician: Referring Ranald Alessio: Treating Rehmat Murtagh/Extender: Charlie Pitter in Treatment: 7 Vital Signs Time Taken: 15:15 Temperature (F): 97.6 Height (in): 60 Pulse (bpm): 80 Weight (lbs): 131 Respiratory Rate (breaths/min): 20 Body Mass Index (BMI): 25.6 Blood Pressure (mmHg): 179/84 Reference Range: 80 - 120 mg / dl Electronic Signature(s) Signed: 11/22/2020 5:46:50 PM By: Deon Pilling Entered By: Deon Pilling on 11/22/2020 15:29:05

## 2020-11-27 ENCOUNTER — Telehealth: Payer: Self-pay | Admitting: Internal Medicine

## 2020-11-27 NOTE — Telephone Encounter (Signed)
Called patient. LVM asking why she needed PT. Office number was provided.   If patient calls back please ask what specifically is she needing PT for.

## 2020-11-27 NOTE — Telephone Encounter (Signed)
   Patient called and is requesting a referral for [facility] for [employer]. Her last OV was 10-31-20. Please advise

## 2020-11-27 NOTE — Telephone Encounter (Signed)
See below

## 2020-11-27 NOTE — Telephone Encounter (Signed)
What body part is needing PT (I.e. pain a specific place, generalized weakness, falls etc)?

## 2020-11-28 ENCOUNTER — Other Ambulatory Visit: Payer: Self-pay

## 2020-11-28 ENCOUNTER — Encounter (HOSPITAL_BASED_OUTPATIENT_CLINIC_OR_DEPARTMENT_OTHER): Payer: Medicare PPO | Admitting: Internal Medicine

## 2020-11-28 DIAGNOSIS — I890 Lymphedema, not elsewhere classified: Secondary | ICD-10-CM | POA: Diagnosis not present

## 2020-11-28 DIAGNOSIS — I87333 Chronic venous hypertension (idiopathic) with ulcer and inflammation of bilateral lower extremity: Secondary | ICD-10-CM | POA: Diagnosis not present

## 2020-11-28 DIAGNOSIS — I87331 Chronic venous hypertension (idiopathic) with ulcer and inflammation of right lower extremity: Secondary | ICD-10-CM | POA: Diagnosis not present

## 2020-11-28 DIAGNOSIS — N183 Chronic kidney disease, stage 3 unspecified: Secondary | ICD-10-CM | POA: Diagnosis not present

## 2020-11-28 DIAGNOSIS — L97818 Non-pressure chronic ulcer of other part of right lower leg with other specified severity: Secondary | ICD-10-CM | POA: Diagnosis not present

## 2020-11-28 DIAGNOSIS — L97812 Non-pressure chronic ulcer of other part of right lower leg with fat layer exposed: Secondary | ICD-10-CM | POA: Diagnosis not present

## 2020-11-28 DIAGNOSIS — I872 Venous insufficiency (chronic) (peripheral): Secondary | ICD-10-CM | POA: Diagnosis not present

## 2020-11-28 NOTE — Progress Notes (Signed)
MAKENZEY, NANNI T ([id number]) . Visit Report for 11/28/2020 Arrival Information Details Patient Name: Date of Service: Carlisle Cater NNE T. 11/28/2020 3:00 PM Medical Record Number: [id number] Patient Account Number: [id number] Date of Birth/Sex: Treating RN: January 24, 1930 (87 y.o. Tonita Phoenix, Lauren Primary Care Monica Zahler: Pricilla Holm Other Clinician: Referring Jaidon Ellery: Treating Deetta Siegmann/Extender: Charlie Pitter in Treatment: 7 Visit Information History Since Last Visit Added or deleted any medications: No Patient Arrived: Kasandra Knudsen Any new allergies or adverse reactions: No Arrival Time: 15:14 Had a fall or experienced change in No Accompanied By: self activities of daily living that may affect Transfer Assistance: None risk of falls: Patient Identification Verified: Yes Signs or symptoms of abuse/neglect since last visito No Secondary Verification Process Completed: Yes Hospitalized since last visit: No Patient Requires Transmission-Based Precautions: No Implantable device outside of the clinic excluding No Patient Has Alerts: No cellular tissue based products placed in the center since last visit: Has Dressing in Place as Prescribed: Yes Has Compression in Place as Prescribed: Yes Pain Present Now: No Electronic Signature(s) Signed: 11/28/2020 6:02:58 PM By: Rhae Hammock RN Entered By: Rhae Hammock on 11/28/2020 15:14:44 -------------------------------------------------------------------------------- Compression Therapy Details Patient Name: Date of Service: Carlisle Cater NNE T. 11/28/2020 3:00 PM Medical Record Number: [id number] Patient Account Number: [id number] Date of Birth/Sex: Treating RN: May 19, 1930 (87 y.o. [name] Primary Care Trevaun Rendleman: Pricilla Holm Other Clinician: Referring Orchid Glassberg: Treating Leidy Massar/Extender: Charlie Pitter in Treatment: 7 Compression Therapy Performed  for Wound Assessment: Wound #1 Right,Anterior Lower Leg Performed By: Clinician Levan Hurst, RN Compression Type: Three Layer Post Procedure Diagnosis Same as Pre-procedure Electronic Signature(s) Signed: 11/28/2020 4:56:05 PM By: Levan Hurst RN, BSN Entered By: Levan Hurst on 11/28/2020 15:54:11 -------------------------------------------------------------------------------- Compression Therapy Details Patient Name: Date of Service: Alben Spittle, Asa Saunas NNE T. 11/28/2020 3:00 PM Medical Record Number: [id number] Patient Account Number: [id number] Date of Birth/Sex: Treating RN: 1929/08/31 (87 y.o. [name] Primary Care Emmalynne Courtney: Pricilla Holm Other Clinician: Referring Kynlie Jane: Treating Lavoy Bernards/Extender: Charlie Pitter in Treatment: 7 Compression Therapy Performed for Wound Assessment: Wound #5 Right,Lateral Lower Leg Performed By: Clinician Levan Hurst, RN Compression Type: Three Layer Post Procedure Diagnosis Same as Pre-procedure Electronic Signature(s) Signed: 11/28/2020 4:56:05 PM By: Levan Hurst RN, BSN Entered By: Levan Hurst on 11/28/2020 15:54:11 -------------------------------------------------------------------------------- Encounter Discharge Information Details Patient Name: Date of Service: Alben Spittle, SUZA NNE T. 11/28/2020 3:00 PM Medical Record Number: [id number] Patient Account Number: [id number] Date of Birth/Sex: Treating RN: [birth year] (87 y.o. [name] Primary Care Riki Gehring: Pricilla Holm Other Clinician: Referring Elissia Spiewak: Treating Kendan Cornforth/Extender: Charlie Pitter in Treatment: 7 Encounter Discharge Information Items Discharge Condition: Stable Ambulatory Status: Cane Discharge Destination: Home Transportation: Private Auto Accompanied By: self Schedule Follow-up Appointment: Yes Clinical Summary of Care: Electronic Signature(s) Signed: 11/28/2020  6:10:10 PM By: Deon Pilling Entered By: Deon Pilling on 11/28/2020 18:05:28 -------------------------------------------------------------------------------- Lower Extremity Assessment Details Patient Name: Date of Service: Carlisle Cater NNE T. 11/28/2020 3:00 PM Medical Record Number: [id number] Patient Account Number: [id number] Date of Birth/Sex: Treating RN: 01/21/1930 (87 y.o. Tonita Phoenix, Lauren Primary Care Jovonna Nickell: Pricilla Holm Other Clinician: Referring Yoshito Gaza: Treating Myrka Sylva/Extender: Charlie Pitter in Treatment: 7 Edema Assessment Assessed: Shirlyn Goltz: No] [Right: Yes] Edema: [Left: Ye] [Right: s] Calf Left: Right: Point of Measurement: 29 cm From Medial Instep 30.8 cm Ankle Left: Right: Point of Measurement: 10 cm From Medial Instep 19 cm Vascular Assessment Pulses: Dorsalis Pedis Palpable: [Right:Yes]  Posterior Tibial Palpable: [Right:Yes] Electronic Signature(s) Signed: 11/28/2020 6:02:58 PM By: Rhae Hammock RN Entered By: Rhae Hammock on 11/28/2020 15:29:32 -------------------------------------------------------------------------------- Multi Wound Chart Details Patient Name: Date of Service: Alben Spittle, SUZA NNE T. 11/28/2020 3:00 PM Medical Record Number: [id number] Patient Account Number: [id number] Date of Birth/Sex: Treating RN: December 15, 1929 (87 y.o. [name] Primary Care Andalyn Heckstall: Pricilla Holm Other Clinician: Referring Alvera Tourigny: Treating Danyelle Brookover/Extender: Charlie Pitter in Treatment: 7 Vital Signs Height(in): 60 Pulse(bpm): 98 Weight(lbs): 131 Blood Pressure(mmHg): 168/74 Body Mass Index(BMI): 26 Temperature(F): 98.7 Respiratory Rate(breaths/min): 17 Photos: [1:No Photos Right, Anterior Lower Leg] [5:No Photos Right, Lateral Lower Leg] [N/A:N/A N/A] Wound Location: [1:Gradually Appeared] [5:Gradually Appeared] [N/A:N/A] Wounding Event: [1:Venous Leg  Ulcer] [5:Lymphedema] [N/A:N/A] Primary Etiology: [1:Peripheral Venous Disease,] [5:Peripheral Venous Disease,] [N/A:N/A] Comorbid History: [1:Osteoarthritis 06/02/2020] [5:Osteoarthritis 11/15/2020] [N/A:N/A] Date Acquired: [1:7] [5:1] [N/A:N/A] Weeks of Treatment: [1:Open] [5:Open] [N/A:N/A] Wound Status: [1:Yes] [5:No] [N/A:N/A] Clustered Wound: [1:0.3x0.2x0.1] [5:0.3x0.3x0.1] [N/A:N/A] Measurements L x W x D (cm) [1:0.047] [5:0.071] [N/A:N/A] A (cm) : rea [1:0.005] [5:0.007] [N/A:N/A] Volume (cm) : [1:100.00%] [5:39.80%] [N/A:N/A] % Reduction in Area: [1:100.00%] [5:41.70%] [N/A:N/A] % Reduction in Volume: [1:Full Thickness Without Exposed] [5:Full Thickness Without Exposed] [N/A:N/A] Classification: [1:Support Structures Medium] [5:Support Structures Medium] [N/A:N/A] Exudate Amount: [1:Serosanguineous] [5:Serosanguineous] [N/A:N/A] Exudate Type: [1:red, brown] [5:red, brown] [N/A:N/A] Exudate Color: [1:Distinct, outline attached] [5:Distinct, outline attached] [N/A:N/A] Wound Margin: [1:Medium (34-66%)] [5:Medium (34-66%)] [N/A:N/A] Granulation Amount: [1:Pink, Pale] [5:Red] [N/A:N/A] Granulation Quality: [1:Medium (34-66%)] [5:Medium (34-66%)] [N/A:N/A] Necrotic Amount: [1:Fat Layer (Subcutaneous Tissue): Yes Fat Layer (Subcutaneous Tissue): Yes N/A] Exposed Structures: [1:Fascia: No Tendon: No Muscle: No Joint: No Bone: No Large (67-100%)] [5:Fascia: No Tendon: No Muscle: No Joint: No Bone: No Small (1-33%)] [N/A:N/A] Epithelialization: [1:Compression Therapy] [5:Compression Therapy] [N/A:N/A] Treatment Notes Electronic Signature(s) Signed: 11/28/2020 4:56:05 PM By: Levan Hurst RN, BSN Signed: 11/28/2020 4:58:19 PM By: Linton Ham MD Entered By: Linton Ham on 11/28/2020 16:39:11 -------------------------------------------------------------------------------- Multi-Disciplinary Care Plan Details Patient Name: Date of Service: Alben Spittle, SUZA NNE T. 11/28/2020 3:00  PM Medical Record Number: [id number] Patient Account Number: [id number] Date of Birth/Sex: Treating RN: [birth year] (87 y.o. [name] Primary Care Yaniris Braddock: Pricilla Holm Other Clinician: Referring Christop Hippert: Treating Smith Mcnicholas/Extender: Charlie Pitter in Treatment: 7 Active Inactive Abuse / Safety / Falls / Self Care Management Nursing Diagnoses: Potential for falls Potential for injury related to falls Goals: Patient will remain injury free related to falls Date Initiated: 10/04/2020 Target Resolution Date: 12/07/2020 Goal Status: Active Patient/caregiver will verbalize understanding of skin care regimen Date Initiated: 10/04/2020 Target Resolution Date: 12/07/2020 Goal Status: Active Interventions: Assess fall risk on admission and as needed Assess: immobility, friction, shearing, incontinence upon admission and as needed Provide education on fall prevention Notes: Electronic Signature(s) Signed: 11/28/2020 4:56:05 PM By: Levan Hurst RN, BSN Entered By: Levan Hurst on 11/28/2020 15:55:57 -------------------------------------------------------------------------------- Pain Assessment Details Patient Name: Date of Service: Carlisle Cater NNE T. 11/28/2020 3:00 PM Medical Record Number: [id number] Patient Account Number: [id number] Date of Birth/Sex: Treating RN: 1929/08/18 (87 y.o. Tonita Phoenix, Lauren Primary Care Adrean Heitz: Pricilla Holm Other Clinician: Referring Colt Martelle: Treating Orvan Papadakis/Extender: Charlie Pitter in Treatment: 7 Active Problems Location of Pain Severity and Description of Pain Patient Has Paino No Site Locations Pain Management and Medication Current Pain Management: Electronic Signature(s) Signed: 11/28/2020 6:02:58 PM By: Rhae Hammock RN Entered By: Rhae Hammock on 11/28/2020  15:18:21 -------------------------------------------------------------------------------- Patient/Caregiver Education Details Patient Name: Date of Service: KIO RPES, SUZA NNE T. 6/29/2022andnbsp3:00  PM Medical Record Number: [id number] Patient Account Number: [id number] Date of Birth/Gender: Treating RN: 1929/12/29 (87 y.o. [name] Primary Care Physician: Pricilla Holm Other Clinician: Referring Physician: Treating Physician/Extender: Charlie Pitter in Treatment: 7 Education Assessment Education Provided To: Patient Education Topics Provided Wound/Skin Impairment: Methods: Explain/Verbal Responses: State content correctly Electronic Signature(s) Signed: 11/28/2020 4:56:05 PM By: Levan Hurst RN, BSN Entered By: Levan Hurst on 11/28/2020 15:56:13 -------------------------------------------------------------------------------- Wound Assessment Details Patient Name: Date of Service: Alben Spittle, Asa Saunas NNE T. 11/28/2020 3:00 PM Medical Record Number: [id number] Patient Account Number: [id number] Date of Birth/Sex: Treating RN: [birth year] (87 y.o. Tonita Phoenix, Lauren Primary Care Alekzander Cardell: Pricilla Holm Other Clinician: Referring Lilyana Lippman: Treating Korra Christine/Extender: Charlie Pitter in Treatment: 7 Wound Status Wound Number: 1 Primary Etiology: Venous Leg Ulcer Wound Location: Right, Anterior Lower Leg Wound Status: Open Wounding Event: Gradually Appeared Comorbid History: Peripheral Venous Disease, Osteoarthritis Date Acquired: 06/02/2020 Weeks Of Treatment: 7 Clustered Wound: Yes Photos Wound Measurements Length: (cm) 0.3 Width: (cm) 0.2 Depth: (cm) 0.1 Area: (cm) 0.047 Volume: (cm) 0.005 % Reduction in Area: 100% % Reduction in Volume: 100% Epithelialization: Large (67-100%) Tunneling: No Undermining: No Wound Description Classification: Full Thickness Without Exposed Support  Structures Wound Margin: Distinct, outline attached Exudate Amount: Medium Exudate Type: Serosanguineous Exudate Color: red, brown Foul Odor After Cleansing: No Slough/Fibrino Yes Wound Bed Granulation Amount: Medium (34-66%) Exposed Structure Granulation Quality: Pink, Pale Fascia Exposed: No Necrotic Amount: Medium (34-66%) Fat Layer (Subcutaneous Tissue) Exposed: Yes Necrotic Quality: Adherent Slough Tendon Exposed: No Muscle Exposed: No Joint Exposed: No Bone Exposed: No Treatment Notes Wound #1 (Lower Leg) Wound Laterality: Right, Anterior Cleanser Soap and Water Discharge Instruction: May shower and wash wound with dial antibacterial soap and water prior to dressing change. Wound Cleanser Discharge Instruction: Cleanse the wound with wound cleanser prior to applying a clean dressing using gauze sponges, not tissue or cotton balls. Peri-Wound Care Triamcinolone 15 (g) Discharge Instruction: Apply liberally to wounds and periwound. Use triamcinolone 15 (g) as directed Sween Lotion (Moisturizing lotion) Discharge Instruction: Apply moisturizing lotion as directed Topical Primary Dressing Promogran Prisma Matrix, 4.34 (sq in) (silver collagen) Discharge Instruction: Moisten collagen with saline or hydrogel Secondary Dressing Woven Gauze Sponge, Non-Sterile 4x4 in Discharge Instruction: Apply over primary dressing as directed. Secured With Compression Wrap ThreePress (3 layer compression wrap) Discharge Instruction: Apply three layer compression as directed. Compression Stockings Jobst Farrow Wrap 4000 Quantity: 1 Right Leg Compression Amount: 30-40 mmHg Discharge Instruction: Apply Francia Greaves daily as instructed. Apply first thing in the morning, remove at night before bed. Add-Ons Electronic Signature(s) Signed: 11/28/2020 5:02:46 PM By: Sandre Kitty Signed: 11/28/2020 6:02:58 PM By: Rhae Hammock RN Entered By: Sandre Kitty on 11/28/2020  16:46:28 -------------------------------------------------------------------------------- Wound Assessment Details Patient Name: Date of Service: Alben Spittle, Asa Saunas NNE T. 11/28/2020 3:00 PM Medical Record Number: [id number] Patient Account Number: [id number] Date of Birth/Sex: Treating RN: Sep 11, 1929 (87 y.o. Tonita Phoenix, Lauren Primary Care Manaal Mandala: Pricilla Holm Other Clinician: Referring Hien Cunliffe: Treating Calleigh Lafontant/Extender: Charlie Pitter in Treatment: 7 Wound Status Wound Number: 5 Primary Etiology: Lymphedema Wound Location: Right, Lateral Lower Leg Wound Status: Open Wounding Event: Gradually Appeared Comorbid History: Peripheral Venous Disease, Osteoarthritis Date Acquired: 11/15/2020 Weeks Of Treatment: 1 Clustered Wound: No Photos Wound Measurements Length: (cm) 0.3 Width: (cm) 0.3 Depth: (cm) 0.1 Area: (cm) 0.071 Volume: (cm) 0.007 % Reduction in Area: 39.8% % Reduction in Volume: 41.7% Epithelialization: Small (1-33%) Tunneling: No Undermining: No Wound  Description Classification: Full Thickness Without Exposed Support Structures Wound Margin: Distinct, outline attached Exudate Amount: Medium Exudate Type: Serosanguineous Exudate Color: red, brown Foul Odor After Cleansing: No Slough/Fibrino Yes Wound Bed Granulation Amount: Medium (34-66%) Exposed Structure Granulation Quality: Red Fascia Exposed: No Necrotic Amount: Medium (34-66%) Fat Layer (Subcutaneous Tissue) Exposed: Yes Necrotic Quality: Adherent Slough Tendon Exposed: No Muscle Exposed: No Joint Exposed: No Bone Exposed: No Treatment Notes Wound #5 (Lower Leg) Wound Laterality: Right, Lateral Cleanser Soap and Water Discharge Instruction: May shower and wash wound with dial antibacterial soap and water prior to dressing change. Wound Cleanser Discharge Instruction: Cleanse the wound with wound cleanser prior to applying a clean dressing using gauze  sponges, not tissue or cotton balls. Peri-Wound Care Triamcinolone 15 (g) Discharge Instruction: Apply liberally to wounds and periwound. Use triamcinolone 15 (g) as directed Sween Lotion (Moisturizing lotion) Discharge Instruction: Apply moisturizing lotion as directed Topical Primary Dressing Promogran Prisma Matrix, 4.34 (sq in) (silver collagen) Discharge Instruction: Moisten collagen with saline or hydrogel Secondary Dressing Woven Gauze Sponge, Non-Sterile 4x4 in Discharge Instruction: Apply over primary dressing as directed. Secured With Compression Wrap ThreePress (3 layer compression wrap) Discharge Instruction: Apply three layer compression as directed. Compression Stockings Add-Ons Electronic Signature(s) Signed: 11/28/2020 5:02:46 PM By: Sandre Kitty Signed: 11/28/2020 6:02:58 PM By: Rhae Hammock RN Entered By: Sandre Kitty on 11/28/2020 16:45:36 -------------------------------------------------------------------------------- Vitals Details Patient Name: Date of Service: Alben Spittle, SUZA NNE T. 11/28/2020 3:00 PM Medical Record Number: [id number] Patient Account Number: [id number] Date of Birth/Sex: Treating RN: 1929/09/04 (87 y.o. Tonita Phoenix, Lauren Primary Care Tomeka Kantner: Pricilla Holm Other Clinician: Referring Domonic Hiscox: Treating Cheyan Frees/Extender: Charlie Pitter in Treatment: 7 Vital Signs Time Taken: 15:14 Temperature (F): 98.7 Height (in): 60 Pulse (bpm): 98 Weight (lbs): 131 Respiratory Rate (breaths/min): 17 Body Mass Index (BMI): 25.6 Blood Pressure (mmHg): 168/74 Reference Range: 80 - 120 mg / dl Electronic Signature(s) Signed: 11/28/2020 6:02:58 PM By: Rhae Hammock RN Entered By: Rhae Hammock on 11/28/2020 15:18:13

## 2020-11-28 NOTE — Progress Notes (Signed)
Leah [name], Leah [name] ([id number]) . Visit Report for 11/28/2020 HPI Details Patient Name: Date of Service: Leah [name]. 11/28/2020 3:00 PM Medical Record Number: [id number] Patient Account Number: [id number] Date of Birth/Sex: Treating RN: 11-16-1929 (87 y.o. [name] Primary Care Provider: Pricilla Holm Other Clinician: Referring Provider: Treating Provider/Extender: Charlie Pitter in Treatment: 7 History of Present Illness HPI Description: ADMISSION 10/04/2020; This is an 87 year old woman who lives independently. Her husband was actually a longstanding patient in this clinic who passed away recently. She states her problem began in January with increased swelling in her legs she has a large area on the right anterior lower leg and 3 smaller areas on the left. She has a history of chronic venous insufficiency. I note that she had DVT studies on 03/29/2020 that were negative for DVT She was in her primary doctor's office late . in April and she was given Silvadene cream but she does not feel this has been helping. Recently she has been using Vaseline she has compression stockings but does not wear them reliably including juxta lites which belonged to her husband Past medical history includes chronic venous insufficiency, stage III chronic kidney disease, sciatica, peripheral edema, pelvic fractures ABIs in our clinic were normal 1.13 on the right and 1.06 on the left 5/12; arrives in clinic today with only 1 wound left on the left leg which is the posterior left calf. Still an extensive area on the right anterior lower leg. Our intake nurse reports malodorous purulent looking drainage. We have been using silver alginate under compression 3 layer 5/19 the left posterior calf is closed. Again I get description of this of the right anterior leg is having a lot of drainage and malodor although she only appears to have a cluster of small wounds which  really do not appear to be infected. The PCR culture I did last week showed Pseudomonas and staph aureus. I put this forward to [facility] although I think I am just going to use gentamicin on this under compression. I gave her Keflex last week but even the staff was likely to be MRSA. 6/2; left posterior calf is closed right anterior leg has 3 or 4 small scattered wounds. I used a #3 curette to clean up the surfaces of these. We have been using gentamicin I do not think that is going to be necessary. 6/9; her left posterior calf is remain closed and she has her stocking on. On the right anterior we have 2 open areas that I think are mostly epithelialized although they look vulnerable. For this reason I look towards putting her on another week of compression. She should be done by next week. We have been using Hydrofera Blue on the wound areas 6/16 and; patient has a stocking on her left leg . There are 2 open areas on the right anterior tibial area and just laterally to this area. These are small areas both with surface slough 6/23; still 2 open areas on the right anterior tibia and just lateral to this area. The problem is the base of the wound even under intense illumination looks like skin very difficult to tell. We have been using Hydrofera Blue I change this to silver collagen. She does not yet have a compression stocking for the right leg [Farrow wrap]. We are trying to organize this through prism 6/29; still the 2 small areas. We have been using silver collagen. I debrided both of these last week the larger  area anteriorly is about 50% epithelialized I am quite confident about that. The superior part is epithelialized. The 1 more laterally its difficult to tell. There is a reflection with illumination suggesting epithelialization. Electronic Signature(s) Signed: 11/28/2020 4:58:19 PM By: Linton Ham MD Entered By: Linton Ham on 11/28/2020  16:40:25 -------------------------------------------------------------------------------- Physical Exam Details Patient Name: Date of Service: Leah [name], Leah [name]. 11/28/2020 3:00 PM Medical Record Number: [id number] Patient Account Number: [id number] Date of Birth/Sex: Treating RN: March 21, 1930 (87 y.o. [name] Primary Care Provider: Pricilla Holm Other Clinician: Referring Provider: Treating Provider/Extender: Charlie Pitter in Treatment: 7 Constitutional Patient is hypertensive.. Pulse regular and within target range for patient.Marland Kitchen Respirations regular, non-labored and within target range.. Temperature is normal and within the target range for the patient.Marland Kitchen Appears in no distress. Cardiovascular Pedal pulses are palpable. Her edema control is excellent. Notes Wound exam; the patient has a problem with 2 small areas on the right leg one anteriorly over the mid tibia this looks better with about 50% of this epithelialized. It is difficult to tell the area laterally. I think there may be epithelium over the entire service based on reflection under illumination Electronic Signature(s) Signed: 11/28/2020 4:58:19 PM By: Linton Ham MD Entered By: Linton Ham on 11/28/2020 16:41:55 -------------------------------------------------------------------------------- Physician Orders Details Patient Name: Date of Service: Leah [name], Leah [name]. 11/28/2020 3:00 PM Medical Record Number: [id number] Patient Account Number: [id number] Date of Birth/Sex: Treating RN: [birth year] (87 y.o. 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 - Dr. Dellia Nims Return A Bathing/ Shower/ Hygiene May shower with protection but do not get wound dressing(s) wet. - use a cast protector. Edema Control - Lymphedema / SCD / Other Elevate legs to the level of the heart or above for 30 minutes daily and/or when sitting, a frequency of: - throughout the day Avoid standing for long periods of time. Exercise regularly Moisturize legs daily. - apply lotion every night before bed to left leg. Compression stocking or Garment 20-30 mm/Hg pressure to: - Left leg apply Jobst compression stocking. Apply in the morning and remove at night. Additional Orders / Instructions Follow Nutritious Diet Wound Treatment Wound #1 - Lower Leg Wound Laterality: Right, Anterior Cleanser: Soap and Water 1 x Per Week/30 Days Discharge Instructions: May shower and wash wound with dial antibacterial soap and water prior to dressing change. Cleanser: Wound Cleanser 1 x Per Week/30 Days Discharge Instructions: Cleanse the wound with wound cleanser prior to applying a clean dressing using gauze sponges, not tissue or cotton balls. Peri-Wound Care: Triamcinolone 15 (g) 1 x Per Week/30 Days Discharge Instructions: Apply liberally to wounds and periwound. Use triamcinolone 15 (g) as directed Peri-Wound Care: Sween Lotion (Moisturizing lotion) 1 x Per Week/30 Days Discharge Instructions: Apply moisturizing lotion as directed Prim Dressing: Promogran Prisma Matrix, 4.34 (sq in) (silver collagen) 1 x Per Week/30 Days ary Discharge Instructions: Moisten collagen with saline or hydrogel Secondary Dressing: Woven Gauze Sponge, Non-Sterile 4x4 in 1 x Per Week/30 Days Discharge Instructions: Apply over primary dressing as directed. Compression Wrap: ThreePress (3 layer compression wrap) 1 x Per Week/30 Days Discharge Instructions:  Apply  three layer compression as directed. Compression Stockings: Jobst Farrow Wrap 4000 Right Leg Compression Amount: 30-40 mmHG Discharge Instructions: Apply Francia Greaves daily as instructed. Apply first thing in the morning, remove at night before bed. Wound #5 - Lower Leg Wound Laterality: Right, Lateral Cleanser: Soap and Water 1 x Per Week/30 Days Discharge Instructions: May shower and wash wound with dial antibacterial soap and water prior to dressing change. Cleanser: Wound Cleanser 1 x Per Week/30 Days Discharge Instructions: Cleanse the wound with wound cleanser prior to applying a clean dressing using gauze sponges, not tissue or cotton balls. Peri-Wound Care: Triamcinolone 15 (g) 1 x Per Week/30 Days Discharge Instructions: Apply liberally to wounds and periwound. Use triamcinolone 15 (g) as directed Peri-Wound Care: Sween Lotion (Moisturizing lotion) 1 x Per Week/30 Days Discharge Instructions: Apply moisturizing lotion as directed Prim Dressing: Promogran Prisma Matrix, 4.34 (sq in) (silver collagen) 1 x Per Week/30 Days ary Discharge Instructions: Moisten collagen with saline or hydrogel Secondary Dressing: Woven Gauze Sponge, Non-Sterile 4x4 in 1 x Per Week/30 Days Discharge Instructions: Apply over primary dressing as directed. Compression Wrap: ThreePress (3 layer compression wrap) 1 x Per Week/30 Days Discharge Instructions: Apply three layer compression as directed. Electronic Signature(s) Signed: 11/28/2020 4:56:05 PM By: Levan Hurst RN, BSN Signed: 11/28/2020 4:58:19 PM By: Linton Ham MD Entered By: Levan Hurst on 11/28/2020 15:55:23 -------------------------------------------------------------------------------- Problem List Details Patient Name: Date of Service: Leah [name], Leah [name]. 11/28/2020 3:00 PM Medical Record Number: [id number] Patient Account Number: [id number] Date of Birth/Sex: Treating RN: [birth year] (87 y.o. 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 11/28/2020 3:00 PM Medical Record Number: [id number] Patient Account Number: [id number] Date of Birth/Sex: Treating RN: 05/26/1930 (87 y.o. [name] Primary Care Provider: Pricilla Holm Other Clinician: Referring Provider: Treating Provider/Extender: Charlie Pitter in Treatment: 7 Subjective History of Present Illness (HPI) ADMISSION 10/04/2020; This is an 87 year old woman who lives independently. Her husband was actually a longstanding patient in this clinic who passed away recently. She states her problem began in January with increased swelling in her legs she has a large area on the right anterior lower leg and 3 smaller areas on the left. She has a history of chronic venous insufficiency. I note that she had DVT studies on 03/29/2020 that were negative for DVT She was in her primary doctor's office late . in April and she was given Silvadene cream  but she does not feel this has been helping. Recently she has been using Vaseline she has compression stockings but does not wear them reliably including juxta lites which belonged to her husband Past medical history includes chronic venous insufficiency, stage III chronic kidney disease, sciatica, peripheral edema, pelvic fractures ABIs in our clinic were normal 1.13  on the right and 1.06 on the left 5/12; arrives in clinic today with only 1 wound left on the left leg which is the posterior left calf. Still an extensive area on the right anterior lower leg. Our intake nurse reports malodorous purulent looking drainage. We have been using silver alginate under compression 3 layer 5/19 the left posterior calf is closed. Again I get description of this of the right anterior leg is having a lot of drainage and malodor although she only appears to have a cluster of small wounds which really do not appear to be infected. The PCR culture I did last week showed Pseudomonas and staph aureus. I put this forward to [facility] although I think I am just going to use gentamicin on this under compression. I gave her Keflex last week but even the staff was likely to be MRSA. 6/2; left posterior calf is closed right anterior leg has 3 or 4 small scattered wounds. I used a #3 curette to clean up the surfaces of these. We have been using gentamicin I do not think that is going to be necessary. 6/9; her left posterior calf is remain closed and she has her stocking on. On the right anterior we have 2 open areas that I think are mostly epithelialized although they look vulnerable. For this reason I look towards putting her on another week of compression. She should be done by next week. We have been using Hydrofera Blue on the wound areas 6/16 and; patient has a stocking on her left leg . There are 2 open areas on the right anterior tibial area and just laterally to this area. These are small areas both with surface  slough 6/23; still 2 open areas on the right anterior tibia and just lateral to this area. The problem is the base of the wound even under intense illumination looks like skin very difficult to tell. We have been using Hydrofera Blue I change this to silver collagen. She does not yet have a compression stocking for the right leg [Farrow wrap]. We are trying to organize this through prism 6/29; still the 2 small areas. We have been using silver collagen. I debrided both of these last week the larger area anteriorly is about 50% epithelialized I am quite confident about that. The superior part is epithelialized. The 1 more laterally its difficult to tell. There is a reflection with illumination suggesting epithelialization. Objective Constitutional Patient is hypertensive.. Pulse regular and within target range for patient.Marland Kitchen Respirations regular, non-labored and within target range.. Temperature is normal and within the target range for the patient.Marland Kitchen Appears in no distress. Vitals Time Taken: 3:14 PM, Height: 60 in, Weight: 131 lbs, BMI: 25.6, Temperature: 98.7 F, Pulse: 98 bpm, Respiratory Rate: 17 breaths/min, Blood Pressure: 168/74 mmHg. Cardiovascular Pedal pulses are palpable. Her edema control is excellent. General Notes: Wound exam; the patient has a problem with 2 small areas on the right leg one anteriorly over the mid tibia this looks better with about 50% of this epithelialized. It is difficult to tell the area laterally. I think there may be epithelium over the entire service based on reflection under illumination Integumentary (Hair, Skin) Wound #1 status is Open. Original cause of wound was Gradually Appeared. The date acquired was: 06/02/2020. The wound has been in treatment 7 weeks. The wound is located on the Right,Anterior Lower Leg. The wound measures 0.3cm length x 0.2cm width x 0.1cm depth; 0.047cm^2 area and 0.005cm^3 volume. There is Fat Layer (Subcutaneous Tissue)  exposed.  There is no tunneling or undermining noted. There is a medium amount of serosanguineous drainage noted. The wound margin is distinct with the outline attached to the wound base. There is medium (34-66%) pink, pale granulation within the wound bed. There is a medium (34-66%) amount of necrotic tissue within the wound bed including Adherent Slough. Wound #5 status is Open. Original cause of wound was Gradually Appeared. The date acquired was: 11/15/2020. The wound has been in treatment 1 weeks. The wound is located on the Right,Lateral Lower Leg. The wound measures 0.3cm length x 0.3cm width x 0.1cm depth; 0.071cm^2 area and 0.007cm^3 volume. There is Fat Layer (Subcutaneous Tissue) exposed. There is no tunneling or undermining noted. There is a medium amount of serosanguineous drainage noted. The wound margin is distinct with the outline attached to the wound base. There is medium (34-66%) red granulation within the wound bed. There is a medium (34-66%) amount of necrotic tissue within the wound bed including Adherent Slough. Assessment Active Problems ICD-10 Chronic venous hypertension (idiopathic) with ulcer and inflammation of bilateral lower extremity Non-pressure chronic ulcer of other part of right lower leg with other specified severity Procedures Wound #1 Pre-procedure diagnosis of Wound #1 is a Venous Leg Ulcer located on the Right,Anterior Lower Leg . There was a Three Layer Compression Therapy Procedure by Levan Hurst, RN. Post procedure Diagnosis Wound #1: Same as Pre-Procedure Wound #5 Pre-procedure diagnosis of Wound #5 is a Lymphedema located on the Right,Lateral Lower Leg . There was a Three Layer Compression Therapy Procedure by Levan Hurst, RN. Post procedure Diagnosis Wound #5: Same as Pre-Procedure Plan Follow-up Appointments: Return Appointment in 1 week. - Dr. Dellia Nims Bathing/ Shower/ Hygiene: May shower with protection but do not get wound dressing(s) wet. - use a  cast protector. Edema Control - Lymphedema / SCD / Other: Elevate legs to the level of the heart or above for 30 minutes daily and/or when sitting, a frequency of: - throughout the day Avoid standing for long periods of time. Exercise regularly Moisturize legs daily. - apply lotion every night before bed to left leg. Compression stocking or Garment 20-30 mm/Hg pressure to: - Left leg apply Jobst compression stocking. Apply in the morning and remove at night. Additional Orders / Instructions: Follow Nutritious Diet WOUND #1: - Lower Leg Wound Laterality: Right, Anterior Cleanser: Soap and Water 1 x Per Week/30 Days Discharge Instructions: May shower and wash wound with dial antibacterial soap and water prior to dressing change. Cleanser: Wound Cleanser 1 x Per Week/30 Days Discharge Instructions: Cleanse the wound with wound cleanser prior to applying a clean dressing using gauze sponges, not tissue or cotton balls. Peri-Wound Care: Triamcinolone 15 (g) 1 x Per Week/30 Days Discharge Instructions: Apply liberally to wounds and periwound. Use triamcinolone 15 (g) as directed Peri-Wound Care: Sween Lotion (Moisturizing lotion) 1 x Per Week/30 Days Discharge Instructions: Apply moisturizing lotion as directed Prim Dressing: Promogran Prisma Matrix, 4.34 (sq in) (silver collagen) 1 x Per Week/30 Days ary Discharge Instructions: Moisten collagen with saline or hydrogel Secondary Dressing: Woven Gauze Sponge, Non-Sterile 4x4 in 1 x Per Week/30 Days Discharge Instructions: Apply over primary dressing as directed. Com pression Wrap: ThreePress (3 layer compression wrap) 1 x Per Week/30 Days Discharge Instructions: Apply three layer compression as directed. Com pression Stockings: Jobst Farrow Wrap 4000 Compression Amount: 30-40 mmHg (right) Discharge Instructions: Apply Francia Greaves daily as instructed. Apply first thing in the morning, remove at night before bed. WOUND #5: - Lower  Leg Wound  Laterality: Right, Lateral Cleanser: Soap and Water 1 x Per Week/30 Days Discharge Instructions: May shower and wash wound with dial antibacterial soap and water prior to dressing change. Cleanser: Wound Cleanser 1 x Per Week/30 Days Discharge Instructions: Cleanse the wound with wound cleanser prior to applying a clean dressing using gauze sponges, not tissue or cotton balls. Peri-Wound Care: Triamcinolone 15 (g) 1 x Per Week/30 Days Discharge Instructions: Apply liberally to wounds and periwound. Use triamcinolone 15 (g) as directed Peri-Wound Care: Sween Lotion (Moisturizing lotion) 1 x Per Week/30 Days Discharge Instructions: Apply moisturizing lotion as directed Prim Dressing: Promogran Prisma Matrix, 4.34 (sq in) (silver collagen) 1 x Per Week/30 Days ary Discharge Instructions: Moisten collagen with saline or hydrogel Secondary Dressing: Woven Gauze Sponge, Non-Sterile 4x4 in 1 x Per Week/30 Days Discharge Instructions: Apply over primary dressing as directed. Com pression Wrap: ThreePress (3 layer compression wrap) 1 x Per Week/30 Days Discharge Instructions: Apply three layer compression as directed. 1. Continuing with the Prisma to both areas. I am hopeful I may be able to heal her up next week. Electronic Signature(s) Signed: 11/28/2020 4:58:19 PM By: Linton Ham MD Entered By: Linton Ham on 11/28/2020 16:42:27 -------------------------------------------------------------------------------- SuperBill Details Patient Name: Date of Service: Leah [name], Leah [name]. 11/28/2020 Medical Record Number: [id number] Patient Account Number: [id number] Date of Birth/Sex: Treating RN: 1929/10/11 (87 y.o. [name] Primary Care Provider: Pricilla Holm Other Clinician: Referring Provider: Treating Provider/Extender: Charlie Pitter in Treatment: 7 Diagnosis Coding ICD-10 Codes Code Description [phone] Chronic venous hypertension  (idiopathic) with ulcer and inflammation of bilateral lower extremity L97.818 Non-pressure chronic ulcer of other part of right lower leg with other specified severity Facility Procedures CPT4 Code: [id number] Description: (Facility Use Only) [phone] - [place] [facility] LWR RT LEG Modifier: Quantity: 1 Physician Procedures : CPT4 Code Description Modifier [id number] - WC PHYS LEVEL 3 - EST PT ICD-10 Diagnosis Description I87.333 Chronic venous hypertension (idiopathic) with ulcer and inflammation of bilateral lower extremity L97.818 Non-pressure chronic ulcer of other  part of right lower leg with other specified severity Quantity: 1 Electronic Signature(s) Signed: 11/28/2020 4:58:19 PM By: Linton Ham MD Entered By: Linton Ham on 11/28/2020 16:42:47

## 2020-12-05 ENCOUNTER — Other Ambulatory Visit: Payer: Self-pay

## 2020-12-05 ENCOUNTER — Encounter (HOSPITAL_BASED_OUTPATIENT_CLINIC_OR_DEPARTMENT_OTHER): Payer: Medicare PPO | Attending: Internal Medicine | Admitting: Internal Medicine

## 2020-12-05 DIAGNOSIS — L97818 Non-pressure chronic ulcer of other part of right lower leg with other specified severity: Secondary | ICD-10-CM | POA: Diagnosis not present

## 2020-12-05 DIAGNOSIS — L97812 Non-pressure chronic ulcer of other part of right lower leg with fat layer exposed: Secondary | ICD-10-CM | POA: Diagnosis not present

## 2020-12-05 DIAGNOSIS — I87333 Chronic venous hypertension (idiopathic) with ulcer and inflammation of bilateral lower extremity: Secondary | ICD-10-CM | POA: Insufficient documentation

## 2020-12-05 DIAGNOSIS — I872 Venous insufficiency (chronic) (peripheral): Secondary | ICD-10-CM | POA: Insufficient documentation

## 2020-12-05 DIAGNOSIS — I890 Lymphedema, not elsewhere classified: Secondary | ICD-10-CM | POA: Insufficient documentation

## 2020-12-05 DIAGNOSIS — I129 Hypertensive chronic kidney disease with stage 1 through stage 4 chronic kidney disease, or unspecified chronic kidney disease: Secondary | ICD-10-CM | POA: Insufficient documentation

## 2020-12-05 DIAGNOSIS — N183 Chronic kidney disease, stage 3 unspecified: Secondary | ICD-10-CM | POA: Diagnosis not present

## 2020-12-05 DIAGNOSIS — I87331 Chronic venous hypertension (idiopathic) with ulcer and inflammation of right lower extremity: Secondary | ICD-10-CM | POA: Diagnosis not present

## 2020-12-05 NOTE — Progress Notes (Signed)
Leah [name], Leah [name] ([id number]) . Visit Report for 10/25/2020 SuperBill Details Patient Name: Date of Service: Leah [name]. 10/25/2020 Medical Record Number: [id number] Patient Account Number: [id number] Date of Birth/Sex: Treating RN: [birth year] (87 y.o. Leah [name] Primary Care Provider: Pricilla Holm Other Clinician: Referring Provider: Treating Provider/Extender: Leah [name] in Treatment: 3 Diagnosis Coding ICD-10 Codes Code Description [phone] Chronic venous hypertension (idiopathic) with ulcer and inflammation of bilateral lower extremity L97.818 Non-pressure chronic ulcer of other part of right lower leg with other specified severity Facility Procedures CPT4 Code Description Modifier Quantity [phone] (Facility Use Only) [phone] - [place] LWR RT LEG 1 ICD-10 Diagnosis Description L97.818 Non-pressure chronic ulcer of other part of right lower leg with other specified severity Electronic Signature(s) Signed: 10/25/2020 2:40:13 PM By: Lorrin Jackson Signed: 12/05/2020 5:14:27 PM By: Linton Ham MD Entered By: Lorrin Jackson on 10/25/2020 14:40:13

## 2020-12-05 NOTE — Progress Notes (Signed)
ANDREY, HOOBLER T ([id number]) . Visit Report for 12/05/2020 Arrival Information Details Patient Name: Date of Service: Leah [name] Leah T. 12/05/2020 3:00 PM Medical Record Number: [id number] Patient Account Number: [id number] Date of Birth/Sex: Treating RN: [birth year] (87 y.o. Leah [name], Leah [name] Primary Care Conda Wannamaker: Pricilla Holm Other Clinician: Referring Litzi Binning: Treating Kemet Nijjar/Extender: Charlie Pitter in Treatment: 8 Visit Information History Since Last Visit Added or deleted any medications: No Patient Arrived: Leah [name] Any new allergies or adverse reactions: No Arrival Time: 15:15 Had a fall or experienced change in No Accompanied By: self activities of daily living that may affect Transfer Assistance: None risk of falls: Patient Identification Verified: Yes Signs or symptoms of abuse/neglect since last visito No Secondary Verification Process Completed: Yes Hospitalized since last visit: No Patient Requires Transmission-Based Precautions: No Implantable device outside of the clinic excluding No Patient Has Alerts: No cellular tissue based products placed in the center since last visit: Has Dressing in Place as Prescribed: Yes Has Compression in Place as Prescribed: Yes Pain Present Now: No Electronic Signature(s) Signed: 12/05/2020 6:50:03 PM By: Deon Pilling Entered By: Deon Pilling on 12/05/2020 15:22:48 -------------------------------------------------------------------------------- Compression Therapy Details Patient Name: Date of Service: Leah [name] Leah T. 12/05/2020 3:00 PM Medical Record Number: [id number] Patient Account Number: [id number] Date of Birth/Sex: Treating RN: 08/28/1929 (87 y.o. [name] Primary Care Carlito Bogert: Pricilla Holm Other Clinician: Referring Marlaya Turck: Treating Dory Verdun/Extender: Charlie Pitter in Treatment: 8 Compression Therapy Performed for Wound  Assessment: Wound #1 Right,Anterior Lower Leg Performed By: Clinician Levan Hurst, RN Compression Type: Three Layer Post Procedure Diagnosis Same as Pre-procedure Electronic Signature(s) Signed: 12/05/2020 7:11:13 PM By: Levan Hurst RN, BSN Entered By: Levan Hurst on 12/05/2020 15:41:00 -------------------------------------------------------------------------------- Encounter Discharge Information Details Patient Name: Date of Service: Leah [name], Leah Leah T. 12/05/2020 3:00 PM Medical Record Number: [id number] Patient Account Number: [id number] Date of Birth/Sex: Treating RN: [birth year] (87 y.o. [name] Primary Care Trinitie Mcgirr: Pricilla Holm Other Clinician: Referring Diya Gervasi: Treating Marji Kuehnel/Extender: Charlie Pitter in Treatment: 8 Encounter Discharge Information Items Discharge Condition: Stable Ambulatory Status: Ambulatory Discharge Destination: Home Transportation: Private Auto Accompanied By: alone Schedule Follow-up Appointment: Yes Clinical Summary of Care: Patient Declined Electronic Signature(s) Signed: 12/05/2020 7:11:13 PM By: Levan Hurst RN, BSN Entered By: Levan Hurst on 12/05/2020 18:35:19 -------------------------------------------------------------------------------- Lower Extremity Assessment Details Patient Name: Date of Service: Leah [name] Leah T. 12/05/2020 3:00 PM Medical Record Number: [id number] Patient Account Number: [id number] Date of Birth/Sex: Treating RN: [birth year] (87 y.o. Leah [name], [name] Primary Care Allina Riches: Pricilla Holm Other Clinician: Referring Jenah Vanasten: Treating Hilaria Titsworth/Extender: Charlie Pitter in Treatment: 8 Edema Assessment Assessed: [Left: No] [Right: Yes] Edema: [Left: Ye] [Right: s] Calf Left: Right: Point of Measurement: 29 cm From Medial Instep 32 cm Ankle Left: Right: Point of Measurement: 10 cm From Medial Instep 21  cm Vascular Assessment Pulses: Dorsalis Pedis Palpable: [Right:Yes] Electronic Signature(s) Signed: 12/05/2020 6:50:03 PM By: Deon Pilling Entered By: Deon Pilling on 12/05/2020 15:23:28 -------------------------------------------------------------------------------- Multi Wound Chart Details Patient Name: Date of Service: Leah [name], Leah Saunas Leah T. 12/05/2020 3:00 PM Medical Record Number: [id number] Patient Account Number: [id number] Date of Birth/Sex: Treating RN: 1929-09-27 (87 y.o. [name] Primary Care Hani Patnode: Pricilla Holm Other Clinician: Referring Joselynne Killam: Treating Arel Tippen/Extender: Charlie Pitter in Treatment: 8 Vital Signs Height(in): 60 Pulse(bpm): 33 Weight(lbs): 131 Blood Pressure(mmHg): 153/75 Body Mass Index(BMI): 26 Temperature(F): 98.7 Respiratory Rate(breaths/min): 20 Photos: [1:No Photos Right, Anterior  Lower Leg] [5:No Photos Right, Lateral Lower Leg] [N/A:N/A N/A] Wound Location: [1:Gradually Appeared] [5:Gradually Appeared] [N/A:N/A] Wounding Event: [1:Venous Leg Ulcer] [5:Lymphedema] [N/A:N/A] Primary Etiology: [1:Peripheral Venous Disease,] [5:N/A] [N/A:N/A] Comorbid History: [1:Osteoarthritis 06/02/2020] [5:11/15/2020] [N/A:N/A] Date Acquired: [1:8] [5:2] [N/A:N/A] Weeks of Treatment: [1:Open] [5:Healed - Epithelialized] [N/A:N/A] Wound Status: [1:Yes] [5:No] [N/A:N/A] Clustered Wound: [1:0.1x0.1x0.1] [5:0x0x0] [N/A:N/A] Measurements L x W x D (cm) [1:0.008] [5:0] [N/A:N/A] A (cm) : rea [1:0.001] [5:0] [N/A:N/A] Volume (cm) : [1:100.00%] [5:100.00%] [N/A:N/A] % Reduction in Area: [1:100.00%] [5:100.00%] [N/A:N/A] % Reduction in Volume: [1:Full Thickness Without Exposed] [5:Full Thickness Without Exposed] [N/A:N/A] Classification: [1:Support Structures Medium] [5:Support Structures N/A] [N/A:N/A] Exudate Amount: [1:Serosanguineous] [5:N/A] [N/A:N/A] Exudate Type: [1:red, brown] [5:N/A] [N/A:N/A] Exudate  Color: [1:Distinct, outline attached] [5:N/A] [N/A:N/A] Wound Margin: [1:Medium (34-66%)] [5:N/A] [N/A:N/A] Granulation Amount: [1:Pink, Pale] [5:N/A] [N/A:N/A] Granulation Quality: [1:Medium (34-66%)] [5:N/A] [N/A:N/A] Necrotic Amount: [1:Fat Layer (Subcutaneous Tissue): Yes N/A] [N/A:N/A] Exposed Structures: [1:Fascia: No Tendon: No Muscle: No Joint: No Bone: No Large (67-100%)] [5:N/A] [N/A:N/A] Epithelialization: [1:Compression Therapy] [5:N/A] [N/A:N/A] Treatment Notes Electronic Signature(s) Signed: 12/05/2020 5:13:41 PM By: Linton Ham MD Signed: 12/05/2020 7:11:13 PM By: Levan Hurst RN, BSN Entered By: Linton Ham on 12/05/2020 15:47:00 -------------------------------------------------------------------------------- Multi-Disciplinary Care Plan Details Patient Name: Date of Service: Leah [name], Leah Leah T. 12/05/2020 3:00 PM Medical Record Number: [id number] Patient Account Number: [id number] Date of Birth/Sex: Treating RN: [birth year] (87 y.o. [name] Primary Care Javiana Anwar: Pricilla Holm Other Clinician: Referring Zanobia Griebel: Treating Jazzelle Zhang/Extender: Charlie Pitter in Treatment: 8 Active Inactive Abuse / Safety / Falls / Self Care Management Nursing Diagnoses: Potential for falls Potential for injury related to falls Goals: Patient will remain injury free related to falls Date Initiated: 10/04/2020 Target Resolution Date: 01/04/2021 Goal Status: Active Patient/caregiver will verbalize understanding of skin care regimen Date Initiated: 10/04/2020 Target Resolution Date: 01/04/2021 Goal Status: Active Interventions: Assess fall risk on admission and as needed Assess: immobility, friction, shearing, incontinence upon admission and as needed Provide education on fall prevention Notes: Electronic Signature(s) Signed: 12/05/2020 7:11:13 PM By: Levan Hurst RN, BSN Entered By: Levan Hurst on 12/05/2020  18:34:22 -------------------------------------------------------------------------------- Pain Assessment Details Patient Name: Date of Service: Leah [name], Leah Saunas Leah T. 12/05/2020 3:00 PM Medical Record Number: [id number] Patient Account Number: [id number] Date of Birth/Sex: Treating RN: August 01, 1929 (87 y.o. Leah [name] Primary Care Tekeisha Hakim: Pricilla Holm Other Clinician: Referring Swayzie Choate: Treating Ercil Cassis/Extender: Charlie Pitter in Treatment: 8 Active Problems Location of Pain Severity and Description of Pain Patient Has Paino No Site Locations Rate the pain. Current Pain Level: 0 Pain Management and Medication Current Pain Management: Medication: No Cold Application: No Rest: No Massage: No Activity: No FergusonE.N.S.: No Heat Application: No Leg drop or elevation: No Is the Current Pain Management Adequate: Adequate How does your wound impact your activities of daily livingo Sleep: No Bathing: No Appetite: No Relationship With Others: No Bladder Continence: No Emotions: No Bowel Continence: No Work: No Toileting: No Drive: No Dressing: No Hobbies: No Electronic Signature(s) Signed: 12/05/2020 6:50:03 PM By: Deon Pilling Entered By: Deon Pilling on 12/05/2020 15:23:16 -------------------------------------------------------------------------------- Patient/Caregiver Education Details Patient Name: Date of Service: Leah [name] 7/6/2022andnbsp3:00 PM Medical Record Number: [id number] Patient Account Number: [id number] Date of Birth/Gender: Treating RN: [birth year] (87 y.o. [name] Primary Care Physician: Pricilla Holm Other Clinician: Referring Physician: Treating Physician/Extender: Charlie Pitter in Treatment: 8 Education Assessment Education Provided To: Patient Education Topics Provided Wound/Skin Impairment: Methods: Explain/Verbal Responses:  State content  correctly Electronic Signature(s) Signed: 12/05/2020 7:11:13 PM By: Levan Hurst RN, BSN Entered By: Levan Hurst on 12/05/2020 18:34:32 -------------------------------------------------------------------------------- Wound Assessment Details Patient Name: Date of Service: Leah [name], Leah Saunas Leah T. 12/05/2020 3:00 PM Medical Record Number: [id number] Patient Account Number: [id number] Date of Birth/Sex: Treating RN: 02-23-1930 (87 y.o. Leah [name], Leah [name] Primary Care Cambri Plourde: Pricilla Holm Other Clinician: Referring Tin Engram: Treating Alyxander Kollmann/Extender: Charlie Pitter in Treatment: 8 Wound Status Wound Number: 1 Primary Etiology: Venous Leg Ulcer Wound Location: Right, Anterior Lower Leg Wound Status: Open Wounding Event: Gradually Appeared Comorbid History: Peripheral Venous Disease, Osteoarthritis Date Acquired: 06/02/2020 Weeks Of Treatment: 8 Clustered Wound: Yes Wound Measurements Length: (cm) 0.1 Width: (cm) 0.1 Depth: (cm) 0.1 Area: (cm) 0.008 Volume: (cm) 0.001 % Reduction in Area: 100% % Reduction in Volume: 100% Epithelialization: Large (67-100%) Tunneling: No Undermining: No Wound Description Classification: Full Thickness Without Exposed Support Structures Wound Margin: Distinct, outline attached Exudate Amount: Medium Exudate Type: Serosanguineous Exudate Color: red, brown Foul Odor After Cleansing: No Slough/Fibrino Yes Wound Bed Granulation Amount: Medium (34-66%) Exposed Structure Granulation Quality: Pink, Pale Fascia Exposed: No Necrotic Amount: Medium (34-66%) Fat Layer (Subcutaneous Tissue) Exposed: Yes Necrotic Quality: Adherent Slough Tendon Exposed: No Muscle Exposed: No Joint Exposed: No Bone Exposed: No Treatment Notes Wound #1 (Lower Leg) Wound Laterality: Right, Anterior Cleanser Soap and Water Discharge Instruction: May shower and wash wound with dial antibacterial soap and water prior to dressing  change. Wound Cleanser Discharge Instruction: Cleanse the wound with wound cleanser prior to applying a clean dressing using gauze sponges, not tissue or cotton balls. Peri-Wound Care Triamcinolone 15 (g) Discharge Instruction: Apply liberally to wounds and periwound. Use triamcinolone 15 (g) as directed Sween Lotion (Moisturizing lotion) Discharge Instruction: Apply moisturizing lotion as directed Topical Primary Dressing IODOFLEX 0.9% Cadexomer Iodine Pad 4x6 cm Discharge Instruction: Apply to wound bed as instructed Secondary Dressing Woven Gauze Sponge, Non-Sterile 4x4 in Discharge Instruction: Apply over primary dressing as directed. Secured With Compression Wrap ThreePress (3 layer compression wrap) Discharge Instruction: Apply three layer compression as directed. Compression Stockings Add-Ons Electronic Signature(s) Signed: 12/05/2020 6:50:03 PM By: Deon Pilling Entered By: Deon Pilling on 12/05/2020 15:24:35 -------------------------------------------------------------------------------- Wound Assessment Details Patient Name: Date of Service: Leah [name] Leah T. 12/05/2020 3:00 PM Medical Record Number: [id number] Patient Account Number: [id number] Date of Birth/Sex: Treating RN: Feb 16, 1930 (87 y.o. Leah [name], Leah [name] Primary Care Francois Elk: Pricilla Holm Other Clinician: Referring Demontez Novack: Treating Chi Garlow/Extender: Charlie Pitter in Treatment: 8 Wound Status Wound Number: 5 Primary Etiology: Lymphedema Wound Location: Right, Lateral Lower Leg Wound Status: Healed - Epithelialized Wounding Event: Gradually Appeared Date Acquired: 11/15/2020 Weeks Of Treatment: 2 Clustered Wound: No Wound Measurements Length: (cm) Width: (cm) Depth: (cm) Area: (cm) Volume: (cm) 0 % Reduction in Area: 100% 0 % Reduction in Volume: 100% 0 0 0 Wound Description Classification: Full Thickness Without Exposed Support  Structur es Treatment Notes Wound #5 (Lower Leg) Wound Laterality: Right, Lateral Cleanser Peri-Wound Care Topical Primary Dressing Secondary Dressing Secured With Compression Wrap Compression Stockings Add-Ons Electronic Signature(s) Signed: 12/05/2020 6:50:03 PM By: Deon Pilling Entered By: Deon Pilling on 12/05/2020 15:23:38 -------------------------------------------------------------------------------- Vitals Details Patient Name: Date of Service: Leah [name], Leah Leah T. 12/05/2020 3:00 PM Medical Record Number: [id number] Patient Account Number: [id number] Date of Birth/Sex: Treating RN: [birth year] (87 y.o. Leah [name] Primary Care Eufemia Prindle: Pricilla Holm Other Clinician: Referring Karas Pickerill: Treating Letisia Schwalb/Extender: Charlie Pitter in Treatment: 8 Vital  Signs Time Taken: 15:15 Temperature (F): 98.7 Height (in): 60 Pulse (bpm): 76 Weight (lbs): 131 Respiratory Rate (breaths/min): 20 Body Mass Index (BMI): 25.6 Blood Pressure (mmHg): 153/75 Reference Range: 80 - 120 mg / dl Electronic Signature(s) Signed: 12/05/2020 6:50:03 PM By: Deon Pilling Entered By: Deon Pilling on 12/05/2020 15:23:04

## 2020-12-06 NOTE — Progress Notes (Signed)
ORCHID, GLASSBERG T ([id number]) . Visit Report for 12/05/2020 HPI Details Patient Name: Date of Service: Carlisle Cater NNE T. 12/05/2020 3:00 PM Medical Record Number: [id number] Patient Account Number: [id number] Date of Birth/Sex: Treating RN: Oct 31, 1929 (87 y.o. [name] Primary Care Provider: Pricilla Holm Other Clinician: Referring Provider: Treating Provider/Extender: Charlie Pitter in Treatment: 8 History of Present Illness HPI Description: ADMISSION 10/04/2020; This is an 87 year old woman who lives independently. Her husband was actually a longstanding patient in this clinic who passed away recently. She states her problem began in January with increased swelling in her legs she has a large area on the right anterior lower leg and 3 smaller areas on the left. She has a history of chronic venous insufficiency. I note that she had DVT studies on 03/29/2020 that were negative for DVT She was in her primary doctor's office late . in April and she was given Silvadene cream but she does not feel this has been helping. Recently she has been using Vaseline she has compression stockings but does not wear them reliably including juxta lites which belonged to her husband Past medical history includes chronic venous insufficiency, stage III chronic kidney disease, sciatica, peripheral edema, pelvic fractures ABIs in our clinic were normal 1.13 on the right and 1.06 on the left 5/12; arrives in clinic today with only 1 wound left on the left leg which is the posterior left calf. Still an extensive area on the right anterior lower leg. Our intake nurse reports malodorous purulent looking drainage. We have been using silver alginate under compression 3 layer 5/19 the left posterior calf is closed. Again I get description of this of the right anterior leg is having a lot of drainage and malodor although she only appears to have a cluster of small wounds which  really do not appear to be infected. The PCR culture I did last week showed Pseudomonas and staph aureus. I put this forward to [facility] although I think I am just going to use gentamicin on this under compression. I gave her Keflex last week but even the staff was likely to be MRSA. 6/2; left posterior calf is closed right anterior leg has 3 or 4 small scattered wounds. I used a #3 curette to clean up the surfaces of these. We have been using gentamicin I do not think that is going to be necessary. 6/9; her left posterior calf is remain closed and she has her stocking on. On the right anterior we have 2 open areas that I think are mostly epithelialized although they look vulnerable. For this reason I look towards putting her on another week of compression. She should be done by next week. We have been using Hydrofera Blue on the wound areas 6/16 and; patient has a stocking on her left leg . There are 2 open areas on the right anterior tibial area and just laterally to this area. These are small areas both with surface slough 6/23; still 2 open areas on the right anterior tibia and just lateral to this area. The problem is the base of the wound even under intense illumination looks like skin very difficult to tell. We have been using Hydrofera Blue I change this to silver collagen. She does not yet have a compression stocking for the right leg [Farrow wrap]. We are trying to organize this through prism 6/29; still the 2 small areas. We have been using silver collagen. I debrided both of these last week the larger  area anteriorly is about 50% epithelialized I am quite confident about that. The superior part is epithelialized. The 1 more laterally its difficult to tell. There is a reflection with illumination suggesting epithelialization. 7/6; the area laterally on the right leg is healed the anterior 1 is still open with a surface that looks like the head of the pen but there is still drainage. There  is no evidence of infection. Electronic Signature(s) Signed: 12/05/2020 5:13:41 PM By: Linton Ham MD Entered By: Linton Ham on 12/05/2020 15:47:43 -------------------------------------------------------------------------------- Physical Exam Details Patient Name: Date of Service: Alben Spittle, Asa Saunas NNE T. 12/05/2020 3:00 PM Medical Record Number: [id number] Patient Account Number: [id number] Date of Birth/Sex: Treating RN: 01-24-1930 (87 y.o. [name] Primary Care Provider: Pricilla Holm Other Clinician: Referring Provider: Treating Provider/Extender: Charlie Pitter in Treatment: 8 Constitutional Patient is hypertensive.. Pulse regular and within target range for patient.Marland Kitchen Respirations regular, non-labored and within target range.. Temperature is normal and within the target range for the patient.Marland Kitchen Appears in no distress. Notes Wound exam; right leg laterally appears to have closed over. Anteriorly the area looks almost like its 100% epithelialized although there is still a draining area about the size of the head of a pen Electronic Signature(s) Signed: 12/05/2020 5:13:41 PM By: Linton Ham MD Entered By: Linton Ham on 12/05/2020 15:48:32 -------------------------------------------------------------------------------- Physician Orders Details Patient Name: Date of Service: Alben Spittle, Asa Saunas NNE T. 12/05/2020 3:00 PM Medical Record Number: [id number] Patient Account Number: [id number] Date of Birth/Sex: Treating RN: 27-Oct-1929 (87 y.o. [name] Primary Care Provider: Pricilla Holm Other Clinician: Referring Provider: Treating Provider/Extender: Charlie Pitter in Treatment: 8 Verbal / Phone Orders: No Diagnosis Coding ICD-10 Coding Code Description [phone] Chronic venous hypertension (idiopathic) with ulcer and inflammation of bilateral lower extremity L97.818 Non-pressure chronic ulcer of  other part of right lower leg with other specified severity Follow-up Appointments ppointment in 1 week. - Dr. Dellia Nims Return A Bathing/ Shower/ Hygiene May shower with protection but do not get wound dressing(s) wet. - use a cast protector. Edema Control - Lymphedema / SCD / Other Elevate legs to the level of the heart or above for 30 minutes daily and/or when sitting, a frequency of: - throughout the day Avoid standing for long periods of time. Exercise regularly Moisturize legs daily. - apply lotion every night before bed to left leg. Compression stocking or Garment 20-30 mm/Hg pressure to: - Left leg apply Jobst compression stocking. Apply in the morning and remove at night. Additional Orders / Instructions Follow Nutritious Diet Wound Treatment Wound #1 - Lower Leg Wound Laterality: Right, Anterior Cleanser: Soap and Water 1 x Per Week/30 Days Discharge Instructions: May shower and wash wound with dial antibacterial soap and water prior to dressing change. Cleanser: Wound Cleanser 1 x Per Week/30 Days Discharge Instructions: Cleanse the wound with wound cleanser prior to applying a clean dressing using gauze sponges, not tissue or cotton balls. Peri-Wound Care: Triamcinolone 15 (g) 1 x Per Week/30 Days Discharge Instructions: Apply liberally to wounds and periwound. Use triamcinolone 15 (g) as directed Peri-Wound Care: Sween Lotion (Moisturizing lotion) 1 x Per Week/30 Days Discharge Instructions: Apply moisturizing lotion as directed Prim Dressing: IODOFLEX 0.9% Cadexomer Iodine Pad 4x6 cm 1 x Per Week/30 Days ary Discharge Instructions: Apply to wound bed as instructed Secondary Dressing: Woven Gauze Sponge, Non-Sterile 4x4 in 1 x Per Week/30 Days Discharge Instructions: Apply over primary dressing as directed. Compression Wrap: ThreePress (3 layer compression wrap)  1 x Per Week/30 Days Discharge Instructions: Apply three layer compression as directed. Electronic  Signature(s) Signed: 12/05/2020 5:13:41 PM By: Linton Ham MD Signed: 12/05/2020 7:11:13 PM By: Levan Hurst RN, BSN Entered By: Levan Hurst on 12/05/2020 15:39:57 -------------------------------------------------------------------------------- Problem List Details Patient Name: Date of Service: Alben Spittle, SUZA NNE T. 12/05/2020 3:00 PM Medical Record Number: [id number] Patient Account Number: [id number] Date of Birth/Sex: Treating RN: Aug 24, 1929 (87 y.o. 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 12/05/2020 3:00 PM Medical Record Number: [id number] Patient Account Number: [id number] Date of Birth/Sex: Treating RN: [birth year] (87 y.o. [name] Primary Care Provider: Pricilla Holm Other Clinician: Referring Provider: Treating Provider/Extender: Charlie Pitter in Treatment: 8 Subjective History of Present Illness (HPI) ADMISSION 10/04/2020; This is an  87 year old woman who lives independently. Her husband was actually a longstanding patient in this clinic who passed away recently. She states her problem began in January with increased swelling in her legs she has a large area on the right anterior lower leg and 3 smaller areas on the left. She has a history of chronic venous insufficiency. I note that she had DVT studies on 03/29/2020 that were negative for DVT She was in her primary doctor's office late . in April and she was given Silvadene cream but she does not feel this has been helping. Recently she has been using Vaseline she has compression stockings but does not wear them reliably including juxta lites which belonged to her husband Past medical history includes chronic venous insufficiency, stage III chronic kidney disease, sciatica, peripheral edema, pelvic fractures ABIs in our clinic were normal 1.13 on the right and 1.06 on the left 5/12; arrives in clinic today with only 1 wound left on the left leg which is the posterior left calf. Still an extensive area on the right anterior lower leg. Our intake nurse reports malodorous purulent looking drainage. We have been using silver alginate under compression 3 layer 5/19 the left posterior calf is closed. Again I get description of this of the right anterior leg is having a lot of drainage and malodor although she only appears to have a cluster of small wounds which really do not appear to be infected. The PCR culture I did last week showed Pseudomonas and staph aureus. I put this forward to [facility] although I think I am just going to use gentamicin on this under compression. I gave her Keflex last week but even the staff was likely to be MRSA. 6/2; left posterior calf is closed right anterior leg has 3 or 4 small scattered wounds. I used a #3 curette to clean up the surfaces of these. We have been using gentamicin I do not think that is going to be necessary. 6/9; her left posterior  calf  is remain closed and she has her stocking on. On the right anterior we have 2 open areas that I think are mostly epithelialized although they look vulnerable. For this reason I look towards putting her on another week of compression. She should be done by next week. We have been using Hydrofera Blue on the wound areas 6/16 and; patient has a stocking on her left leg . There are 2 open areas on the right anterior tibial area and just laterally to this area. These are small areas both with surface slough 6/23; still 2 open areas on the right anterior tibia and just lateral to this area. The problem is the base of the wound even under intense illumination looks like skin very difficult to tell. We have been using Hydrofera Blue I change this to silver collagen. She does not yet have a compression stocking for the right leg [Farrow wrap]. We are trying to organize this through prism 6/29; still the 2 small areas. We have been using silver collagen. I debrided both of these last week the larger area anteriorly is about 50% epithelialized I am quite confident about that. The superior part is epithelialized. The 1 more laterally its difficult to tell. There is a reflection with illumination suggesting epithelialization. 7/6; the area laterally on the right leg is healed the anterior 1 is still open with a surface that looks like the head of the pen but there is still drainage. There is no evidence of infection. Objective Constitutional Patient is hypertensive.. Pulse regular and within target range for patient.Marland Kitchen Respirations regular, non-labored and within target range.. Temperature is normal and within the target range for the patient.Marland Kitchen Appears in no distress. Vitals Time Taken: 3:15 PM, Height: 60 in, Weight: 131 lbs, BMI: 25.6, Temperature: 98.7 F, Pulse: 76 bpm, Respiratory Rate: 20 breaths/min, Blood Pressure: 153/75 mmHg. General Notes: Wound exam; right leg laterally appears to have closed over.  Anteriorly the area looks almost like its 100% epithelialized although there is still a draining area about the size of the head of a pen Integumentary (Hair, Skin) Wound #1 status is Open. Original cause of wound was Gradually Appeared. The date acquired was: 06/02/2020. The wound has been in treatment 8 weeks. The wound is located on the Right,Anterior Lower Leg. The wound measures 0.1cm length x 0.1cm width x 0.1cm depth; 0.008cm^2 area and 0.001cm^3 volume. There is Fat Layer (Subcutaneous Tissue) exposed. There is no tunneling or undermining noted. There is a medium amount of serosanguineous drainage noted. The wound margin is distinct with the outline attached to the wound base. There is medium (34-66%) pink, pale granulation within the wound bed. There is a medium (34-66%) amount of necrotic tissue within the wound bed including Adherent Slough. Wound #5 status is Healed - Epithelialized. Original cause of wound was Gradually Appeared. The date acquired was: 11/15/2020. The wound has been in treatment 2 weeks. The wound is located on the Right,Lateral Lower Leg. The wound measures 0cm length x 0cm width x 0cm depth; 0cm^2 area and 0cm^3 volume. Assessment Active Problems ICD-10 Chronic venous hypertension (idiopathic) with ulcer and inflammation of bilateral lower extremity Non-pressure chronic ulcer of other part of right lower leg with other specified severity Procedures Wound #1 Pre-procedure diagnosis of Wound #1 is a Venous Leg Ulcer located on the Right,Anterior Lower Leg . There was a Three Layer Compression Therapy Procedure by Levan Hurst, RN. Post procedure Diagnosis Wound #1: Same as Pre-Procedure Plan Follow-up Appointments: Return Appointment  in 1 week. - Dr. Dellia Nims Bathing/ Shower/ Hygiene: May shower with protection but do not get wound dressing(s) wet. - use a cast protector. Edema Control - Lymphedema / SCD / Other: Elevate legs to the level of the heart or above  for 30 minutes daily and/or when sitting, a frequency of: - throughout the day Avoid standing for long periods of time. Exercise regularly Moisturize legs daily. - apply lotion every night before bed to left leg. Compression stocking or Garment 20-30 mm/Hg pressure to: - Left leg apply Jobst compression stocking. Apply in the morning and remove at night. Additional Orders / Instructions: Follow Nutritious Diet WOUND #1: - Lower Leg Wound Laterality: Right, Anterior Cleanser: Soap and Water 1 x Per Week/30 Days Discharge Instructions: May shower and wash wound with dial antibacterial soap and water prior to dressing change. Cleanser: Wound Cleanser 1 x Per Week/30 Days Discharge Instructions: Cleanse the wound with wound cleanser prior to applying a clean dressing using gauze sponges, not tissue or cotton balls. Peri-Wound Care: Triamcinolone 15 (g) 1 x Per Week/30 Days Discharge Instructions: Apply liberally to wounds and periwound. Use triamcinolone 15 (g) as directed Peri-Wound Care: Sween Lotion (Moisturizing lotion) 1 x Per Week/30 Days Discharge Instructions: Apply moisturizing lotion as directed Prim Dressing: IODOFLEX 0.9% Cadexomer Iodine Pad 4x6 cm 1 x Per Week/30 Days ary Discharge Instructions: Apply to wound bed as instructed Secondary Dressing: Woven Gauze Sponge, Non-Sterile 4x4 in 1 x Per Week/30 Days Discharge Instructions: Apply over primary dressing as directed. Com pression Wrap: ThreePress (3 layer compression wrap) 1 x Per Week/30 Days Discharge Instructions: Apply three layer compression as directed. 1. I change the primary dressing to Iodoflex to see if we can get the small area to close over. As mentioned the area on the right has closed over 2. Still under compression 3. She has a juxta lite stocking available for when this closes over hopefully next week. Electronic Signature(s) Signed: 12/05/2020 5:13:41 PM By: Linton Ham MD Entered By: Linton Ham on  12/05/2020 15:49:44 -------------------------------------------------------------------------------- SuperBill Details Patient Name: Date of Service: Alben Spittle, SUZA NNE T. 12/05/2020 Medical Record Number: [id number] Patient Account Number: [id number] Date of Birth/Sex: Treating RN: 11-14-1929 (87 y.o. [name] Primary Care Provider: Pricilla Holm Other Clinician: Referring Provider: Treating Provider/Extender: Charlie Pitter in Treatment: 8 Diagnosis Coding ICD-10 Codes Code Description [phone] Chronic venous hypertension (idiopathic) with ulcer and inflammation of bilateral lower extremity L97.818 Non-pressure chronic ulcer of other part of right lower leg with other specified severity Facility Procedures Physician Procedures : CPT4 Code Description Modifier [id number] - WC PHYS LEVEL 3 - EST PT ICD-10 Diagnosis Description I87.333 Chronic venous hypertension (idiopathic) with ulcer and inflammation of bilateral lower extremity L97.818 Non-pressure chronic ulcer of other  part of right lower leg with other specified severity Quantity: 1 Electronic Signature(s) Signed: 12/05/2020 7:11:13 PM By: Levan Hurst RN, BSN Signed: 12/06/2020 8:35:52 PM By: Linton Ham MD Previous Signature: 12/05/2020 5:13:41 PM Version By: Linton Ham MD Entered By: Levan Hurst on 12/05/2020 18:34:46

## 2020-12-11 ENCOUNTER — Ambulatory Visit: Payer: Medicare PPO | Admitting: Internal Medicine

## 2020-12-12 ENCOUNTER — Encounter (HOSPITAL_BASED_OUTPATIENT_CLINIC_OR_DEPARTMENT_OTHER): Payer: Medicare PPO | Admitting: Internal Medicine

## 2020-12-12 ENCOUNTER — Other Ambulatory Visit: Payer: Self-pay

## 2020-12-12 DIAGNOSIS — I129 Hypertensive chronic kidney disease with stage 1 through stage 4 chronic kidney disease, or unspecified chronic kidney disease: Secondary | ICD-10-CM | POA: Diagnosis not present

## 2020-12-12 DIAGNOSIS — I872 Venous insufficiency (chronic) (peripheral): Secondary | ICD-10-CM | POA: Diagnosis not present

## 2020-12-12 DIAGNOSIS — N183 Chronic kidney disease, stage 3 unspecified: Secondary | ICD-10-CM | POA: Diagnosis not present

## 2020-12-12 DIAGNOSIS — L97818 Non-pressure chronic ulcer of other part of right lower leg with other specified severity: Secondary | ICD-10-CM | POA: Diagnosis not present

## 2020-12-12 DIAGNOSIS — I890 Lymphedema, not elsewhere classified: Secondary | ICD-10-CM | POA: Diagnosis not present

## 2020-12-12 DIAGNOSIS — I87311 Chronic venous hypertension (idiopathic) with ulcer of right lower extremity: Secondary | ICD-10-CM | POA: Diagnosis not present

## 2020-12-12 DIAGNOSIS — I87333 Chronic venous hypertension (idiopathic) with ulcer and inflammation of bilateral lower extremity: Secondary | ICD-10-CM | POA: Diagnosis not present

## 2020-12-13 NOTE — Progress Notes (Signed)
Leah, LABUDA [name] ([id number]) . Visit Report for 12/12/2020 HPI Details Patient Name: Date of Service: Leah [name] NNE [name]. 12/12/2020 3:00 PM Medical Record Number: [id number] Patient Account Number: [id number] Date of Birth/Sex: Treating RN: May 04, 1930 (87 y.o. Leah [name] Primary Care Provider: Pricilla Holm Other Clinician: Referring Provider: Treating Provider/Extender: Charlie Pitter in Treatment: 9 History of Present Illness HPI Description: ADMISSION 10/04/2020; This is an 87 year old woman who lives independently. Her husband was actually a longstanding patient in this clinic who passed away recently. She states her problem began in January with increased swelling in her legs she has a large area on the right anterior lower leg and 3 smaller areas on the left. She has a history of chronic venous insufficiency. I note that she had DVT studies on 03/29/2020 that were negative for DVT She was in her primary doctor's office late . in April and she was given Silvadene cream but she does not feel this has been helping. Recently she has been using Vaseline she has compression stockings but does not wear them reliably including juxta lites which belonged to her husband Past medical history includes chronic venous insufficiency, stage III chronic kidney disease, sciatica, peripheral edema, pelvic fractures ABIs in our clinic were normal 1.13 on the right and 1.06 on the left 5/12; arrives in clinic today with only 1 wound left on the left leg which is the posterior left calf. Still an extensive area on the right anterior lower leg. Our intake nurse reports malodorous purulent looking drainage. We have been using silver alginate under compression 3 layer 5/19 the left posterior calf is closed. Again I get description of this of the right anterior leg is having a lot of drainage and malodor although she only appears to have a cluster of small wounds which  really do not appear to be infected. The PCR culture I did last week showed Pseudomonas and staph aureus. I put this forward to [facility] although I think I am just going to use gentamicin on this under compression. I gave her Keflex last week but even the staff was likely to be MRSA. 6/2; left posterior calf is closed right anterior leg has 3 or 4 small scattered wounds. I used a #3 curette to clean up the surfaces of these. We have been using gentamicin I do not think that is going to be necessary. 6/9; her left posterior calf is remain closed and she has her stocking on. On the right anterior we have 2 open areas that I think are mostly epithelialized although they look vulnerable. For this reason I look towards putting her on another week of compression. She should be done by next week. We have been using Hydrofera Blue on the wound areas 6/16 and; patient has a stocking on her left leg . There are 2 open areas on the right anterior tibial area and just laterally to this area. These are small areas both with surface slough 6/23; still 2 open areas on the right anterior tibia and just lateral to this area. The problem is the base of the wound even under intense illumination looks like skin very difficult to tell. We have been using Hydrofera Blue I change this to silver collagen. She does not yet have a compression stocking for the right leg [Farrow wrap]. We are trying to organize this through prism 6/29; still the 2 small areas. We have been using silver collagen. I debrided both of these last week the larger  area anteriorly is about 50% epithelialized I am quite confident about that. The superior part is epithelialized. The 1 more laterally its difficult to tell. There is a reflection with illumination suggesting epithelialization. 7/6; the area laterally on the right leg is healed the anterior 1 is still open with a surface that looks like the head of the pen but there is still drainage. There  is no evidence of infection. 7/13; the anterior wound is also closed this week. She has her Farrow wrap for the right leg. She is already using them on the left Electronic Signature(s) Signed: 12/12/2020 5:03:28 PM By: Linton Ham MD Entered By: Linton Ham on 12/12/2020 15:58:04 -------------------------------------------------------------------------------- Physical Exam Details Patient Name: Date of Service: Leah [name], Leah [name] NNE [name]. 12/12/2020 3:00 PM Medical Record Number: [id number] Patient Account Number: [id number] Date of Birth/Sex: Treating RN: 1930/02/26 (87 y.o. Leah [name] Primary Care Provider: Pricilla Holm Other Clinician: Referring Provider: Treating Provider/Extender: Charlie Pitter in Treatment: 9 Constitutional Patient is hypertensive.. Pulse regular and within target range for patient.Marland Kitchen Respirations regular, non-labored and within target range.. Temperature is normal and within the target range for the patient.Marland Kitchen Appears in no distress. Notes Wound exam; right lateral leg is healed. The problematic anterior area is also healed. She has excellent edema control Electronic Signature(s) Signed: 12/12/2020 5:03:28 PM By: Linton Ham MD Entered By: Linton Ham on 12/12/2020 15:58:39 -------------------------------------------------------------------------------- Physician Orders Details Patient Name: Date of Service: Leah [name], SUZA NNE [name]. 12/12/2020 3:00 PM Medical Record Number: [id number] Patient Account Number: [id number] Date of Birth/Sex: Treating RN: 03-16-1930 (87 y.o. Leah [name] Primary Care Provider: Pricilla Holm Other Clinician: Referring Provider: Treating Provider/Extender: Charlie Pitter in Treatment: 9 Verbal / Phone Orders: No Diagnosis Coding ICD-10 Coding Code Description [id number] Chronic venous hypertension (idiopathic) with ulcer and inflammation of bilateral  lower extremity L97.818 Non-pressure chronic ulcer of other part of right lower leg with other specified severity [facility]charge from [place] healed!! Edema Control - Lymphedema / SCD / Other Elevate legs to the level of the heart or above for 30 minutes daily and/or when sitting, a frequency of: - throughout the day Avoid standing for long periods of time. Exercise regularly Moisturize legs daily. - apply lotion every night before bed to left leg. Compression stocking or Garment 20-30 mm/Hg pressure to: - Farrow Wrap to both legs daily, apply in the morning and remove at night. Additional Orders / Instructions Follow Nutritious Diet Electronic Signature(s) Signed: 12/12/2020 5:03:28 PM By: Linton Ham MD Signed: 12/13/2020 6:01:10 PM By: Levan Hurst RN, BSN Entered By: Levan Hurst on 12/12/2020 15:54:00 -------------------------------------------------------------------------------- Problem List Details Patient Name: Date of Service: Leah [name], SUZA NNE [name]. 12/12/2020 3:00 PM Medical Record Number: [id number] Patient Account Number: [id number] Date of Birth/Sex: Treating RN: 1930-04-15 (87 y.o. 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 12/12/2020 3:00 PM Medical Record Number: [id number] Patient Account Number: [id number] Date of Birth/Sex: Treating RN: [birth year] (87 y.o. Leah [name] Primary Care Provider: Pricilla Holm Other Clinician: Referring Provider: Treating Provider/Extender: Charlie Pitter in Treatment: 9 Subjective History of Present Illness (HPI) ADMISSION 10/04/2020; This is an 87 year old woman who lives independently. Her husband was actually a longstanding patient in this clinic who passed away recently. She states her problem began in January with increased swelling in her legs she has a large area on the right anterior lower leg and 3 smaller areas on the left. She has a history of chronic venous insufficiency. I note that she had DVT studies on 03/29/2020 that were negative for DVT She was in her primary doctor's office late . in April and she was given Silvadene cream but she does not feel this has been helping. Recently she has been using Vaseline she has compression stockings but does not wear them reliably including juxta lites which belonged to her husband Past medical history includes chronic venous insufficiency, stage III chronic kidney disease, sciatica, peripheral edema, pelvic fractures ABIs in our clinic were normal 1.13 on the right and 1.06 on the left 5/12; arrives in clinic today with only 1 wound left on the left leg which is the posterior left calf. Still an extensive area on the right anterior lower leg. Our intake nurse reports malodorous purulent looking drainage. We have been using silver alginate under compression 3 layer 5/19 the left posterior calf is  closed. Again I get description of this of the right anterior leg is having a lot of drainage and malodor although she only appears to have a cluster of small wounds which really do not appear to be infected. The PCR culture I did last week showed Pseudomonas and staph aureus. I put this forward to [facility] although I think I am just going to use gentamicin on this under compression. I gave her Keflex last week but even the staff was likely to be MRSA. 6/2; left posterior calf is closed right anterior leg has 3 or 4 small scattered wounds. I used a #3 curette to clean up the surfaces of these. We have been using gentamicin I do not think that is going to be necessary. 6/9; her left posterior calf is remain closed and she has her stocking on. On the right anterior we have 2 open areas that I think are mostly epithelialized although they look vulnerable. For this reason I look towards putting her on another week of compression. She should be done by next week. We have been using Hydrofera Blue on the wound areas 6/16 and; patient has a stocking on her left leg . There are 2 open areas on the right anterior tibial area and just laterally to this area. These are small areas both with surface slough 6/23; still 2 open areas on the right anterior tibia and just lateral to this area. The problem is the base of the wound even under intense illumination looks like skin very difficult to tell. We have been using Hydrofera Blue I change this to silver collagen. She does not yet have a compression stocking for the right leg [Farrow wrap]. We are trying to organize this through prism 6/29; still the 2 small areas. We have been using silver collagen. I debrided both of these  last week the larger area anteriorly is about 50% epithelialized I am quite confident about that. The superior part is epithelialized. The 1 more laterally its difficult to tell. There is a reflection with illumination  suggesting epithelialization. 7/6; the area laterally on the right leg is healed the anterior 1 is still open with a surface that looks like the head of the pen but there is still drainage. There is no evidence of infection. 7/13; the anterior wound is also closed this week. She has her Farrow wrap for the right leg. She is already using them on the left Objective Constitutional Patient is hypertensive.. Pulse regular and within target range for patient.Marland Kitchen Respirations regular, non-labored and within target range.. Temperature is normal and within the target range for the patient.Marland Kitchen Appears in no distress. Vitals Time Taken: 3:29 PM, Height: 60 in, Weight: 131 lbs, BMI: 25.6, Temperature: 98.8 F, Pulse: 74 bpm, Respiratory Rate: 17 breaths/min, Blood Pressure: 168/70 mmHg. General Notes: Wound exam; right lateral leg is healed. The problematic anterior area is also healed. She has excellent edema control Integumentary (Hair, Skin) Wound #1 status is Healed - Epithelialized. Original cause of wound was Gradually Appeared. The date acquired was: 06/02/2020. The wound has been in treatment 9 weeks. The wound is located on the Right,Anterior Lower Leg. The wound measures 0cm length x 0cm width x 0cm depth; 0cm^2 area and 0cm^3 volume. Assessment Active Problems ICD-10 Chronic venous hypertension (idiopathic) with ulcer and inflammation of bilateral lower extremity Non-pressure chronic ulcer of other part of right lower leg with other specified severity Plan Discharge From [facility]: Discharge from [place] healed!! Edema Control - Lymphedema / SCD / Other: Elevate legs to the level of the heart or above for 30 minutes daily and/or when sitting, a frequency of: - throughout the day Avoid standing for long periods of time. Exercise regularly Moisturize legs daily. - apply lotion every night before bed to left leg. Compression stocking or Garment 20-30 mm/Hg pressure to: -  Farrow Wrap to both legs daily, apply in the morning and remove at night. Additional Orders / Instructions: Follow Nutritious Diet 1. The patient can be discharged to her own Farrow wrap stockings 2. 20/30 mmHg pressure 3. She is to call us if there are further difficulties. Electronic Signature(s) Signed: 12/12/2020 5:03:28 PM By: Linton Ham MD Entered By: Linton Ham on 12/12/2020 15:59:14 -------------------------------------------------------------------------------- SuperBill Details Patient Name: Date of Service: Leah [name], SUZA NNE [name]. 12/12/2020 Medical Record Number: [id number] Patient Account Number: [id number] Date of Birth/Sex: Treating RN: 04-Jul-1929 (87 y.o. Leah [name] Primary Care Provider: Pricilla Holm Other Clinician: Referring Provider: Treating Provider/Extender: Charlie Pitter in Treatment: 9 Diagnosis Coding ICD-10 Codes Code Description [phone] Chronic venous hypertension (idiopathic) with ulcer and inflammation of bilateral lower extremity L97.818 Non-pressure chronic ulcer of other part of right lower leg with other specified severity Facility Procedures CPT4 Code: [id number] Description: 44818 - WOUND CARE VISIT-LEV 3 EST PT Modifier: Quantity: 1 Physician Procedures : CPT4 Code Description Modifier [id number] - WC PHYS LEVEL 2 - EST PT ICD-10 Diagnosis Description I87.333 Chronic venous hypertension (idiopathic) with ulcer and inflammation of bilateral lower extremity L97.818 Non-pressure chronic ulcer of other  part of right lower leg with other specified severity Quantity: 1 Electronic Signature(s) Signed: 12/13/2020 4:10:45 PM By: Linton Ham MD Signed: 12/13/2020 6:01:10 PM By: Levan Hurst RN, BSN Previous Signature: 12/12/2020 5:03:28 PM Version By: Linton Ham MD Entered By: Levan Hurst on  12/12/2020 18:01:54 

## 2020-12-17 NOTE — Progress Notes (Signed)
CYRILLA, DURKIN [name] ([id number]) . Visit Report for 12/12/2020 Arrival Information Details Patient Name: Date of Service: Leah [name]. 12/12/2020 3:00 PM Medical Record Number: [id number] Patient Account Number: [id number] Date of Birth/Sex: Treating RN: [birth year] (87 y.o. Tonita Phoenix, Lauren Primary Care Reona Zendejas: Pricilla Holm Other Clinician: Referring Velita Quirk: Treating Omare Bilotta/Extender: Charlie Pitter in Treatment: 9 Visit Information History Since Last Visit Added or deleted any medications: No Patient Arrived: Kasandra Knudsen Any new allergies or adverse reactions: No Arrival Time: 15:27 Had a fall or experienced change in No Accompanied By: self activities of daily living that may affect Transfer Assistance: None risk of falls: Patient Identification Verified: Yes Signs or symptoms of abuse/neglect since last visito No Secondary Verification Process Completed: Yes Hospitalized since last visit: No Patient Requires Transmission-Based Precautions: No Implantable device outside of the clinic excluding No Patient Has Alerts: No cellular tissue based products placed in the center since last visit: Has Dressing in Place as Prescribed: Yes Pain Present Now: No Electronic Signature(s) Signed: 12/12/2020 6:12:45 PM By: Rhae Hammock RN Entered By: Rhae Hammock on 12/12/2020 15:29:46 -------------------------------------------------------------------------------- Clinic Level of Care Assessment Details Patient Name: Date of Service: Leah [name]. 12/12/2020 3:00 PM Medical Record Number: [id number] Patient Account Number: [id number] Date of Birth/Sex: Treating RN: 11/22/1929 (87 y.o. [name] Primary Care Roshana Shuffield: Pricilla Holm Other Clinician: Referring Dominga Mcduffie: Treating Treasure Ochs/Extender: Charlie Pitter in Treatment: 9 Clinic Level of Care Assessment Items TOOL 4 Quantity  Score X- 1 0 Use when only an EandM is performed on FOLLOW-UP visit ASSESSMENTS - Nursing Assessment / Reassessment X- 1 10 Reassessment of Co-morbidities (includes updates in patient status) X- 1 5 Reassessment of Adherence to Treatment Plan ASSESSMENTS - Wound and Skin A ssessment / Reassessment X - Simple Wound Assessment / Reassessment - one wound 1 5 []  - 0 Complex Wound Assessment / Reassessment - multiple wounds []  - 0 Dermatologic / Skin Assessment (not related to wound area) ASSESSMENTS - Focused Assessment []  - 0 Circumferential Edema Measurements - multi extremities []  - 0 Nutritional Assessment / Counseling / Intervention X- 1 5 Lower Extremity Assessment (monofilament, tuning fork, pulses) []  - 0 Peripheral Arterial Disease Assessment (using hand held doppler) ASSESSMENTS - Ostomy and/or Continence Assessment and Care []  - 0 Incontinence Assessment and Management []  - 0 Ostomy Care Assessment and Management (repouching, etc.) PROCESS - Coordination of Care X - Simple Patient / Family Education for ongoing care 1 15 []  - 0 Complex (extensive) Patient / Family Education for ongoing care X- 1 10 Staff obtains Programmer, systems, Records, [name] Results / Process Orders est []  - 0 Staff telephones HHA, Nursing Homes / Clarify orders / etc []  - 0 Routine Transfer to another Facility (non-emergent condition) []  - 0 Routine Hospital Admission (non-emergent condition) []  - 0 New Admissions / Biomedical engineer / Ordering NPWT Apligraf, etc. , []  - 0 Emergency Hospital Admission (emergent condition) X- 1 10 Simple Discharge Coordination []  - 0 Complex (extensive) Discharge Coordination PROCESS - Special Needs []  - 0 Pediatric / Minor Patient Management []  - 0 Isolation Patient Management []  - 0 Hearing / Language / Visual special needs []  - 0 Assessment of Community assistance (transportation, D/C planning, etc.) []  - 0 Additional assistance / Altered  mentation []  - 0 Support Surface(s) Assessment (bed, cushion, seat, etc.) INTERVENTIONS - Wound Cleansing / Measurement X - Simple Wound Cleansing - one wound 1 5 []  - 0 Complex Wound Cleansing -  multiple wounds X- 1 5 Wound Imaging (photographs - any number of wounds) []  - 0 Wound Tracing (instead of photographs) X- 1 5 Simple Wound Measurement - one wound []  - 0 Complex Wound Measurement - multiple wounds INTERVENTIONS - Wound Dressings []  - 0 Small Wound Dressing one or multiple wounds []  - 0 Medium Wound Dressing one or multiple wounds []  - 0 Large Wound Dressing one or multiple wounds []  - 0 Application of Medications - topical []  - 0 Application of Medications - injection INTERVENTIONS - Miscellaneous []  - 0 External ear exam []  - 0 Specimen Collection (cultures, biopsies, blood, body fluids, etc.) []  - 0 Specimen(s) / Culture(s) sent or taken to Lab for analysis []  - 0 Patient Transfer (multiple staff / Civil Service fast streamer / Similar devices) []  - 0 Simple Staple / Suture removal (25 or less) []  - 0 Complex Staple / Suture removal (26 or more) []  - 0 Hypo / Hyperglycemic Management (close monitor of Blood Glucose) []  - 0 Ankle / Brachial Index (ABI) - do not check if billed separately X- 1 5 Vital Signs Has the patient been seen at the hospital within the last three years: Yes Total Score: 80 Level Of Care: New/Established - Level 3 Electronic Signature(s) Signed: 12/13/2020 6:01:10 PM By: Levan Hurst RN, BSN Entered By: Levan Hurst on 12/12/2020 18:01:48 -------------------------------------------------------------------------------- Encounter Discharge Information Details Patient Name: Date of Service: Leah [name], Leah [name]. 12/12/2020 3:00 PM Medical Record Number: [id number] Patient Account Number: [id number] Date of Birth/Sex: Treating RN: 09-Jan-1930 (87 y.o. [name] Primary Care Ygnacio Fecteau: Pricilla Holm Other Clinician: Referring  Joani Cosma: Treating Arlina Sabina/Extender: Charlie Pitter in Treatment: 9 Encounter Discharge Information Items Discharge Condition: Stable Ambulatory Status: Cane Discharge Destination: Home Transportation: Private Auto Accompanied By: alone Schedule Follow-up Appointment: Yes Clinical Summary of Care: Patient Declined Electronic Signature(s) Signed: 12/13/2020 6:01:10 PM By: Levan Hurst RN, BSN Entered By: Levan Hurst on 12/12/2020 18:02:31 -------------------------------------------------------------------------------- Lower Extremity Assessment Details Patient Name: Date of Service: Leah [name], Asa Saunas NNE [name]. 12/12/2020 3:00 PM Medical Record Number: [id number] Patient Account Number: [id number] Date of Birth/Sex: Treating RN: 1929/11/23 (87 y.o. Tonita Phoenix, Lauren Primary Care Lysha Schrade: Pricilla Holm Other Clinician: Referring Wash Nienhaus: Treating Rukiya Hodgkins/Extender: Charlie Pitter in Treatment: 9 Edema Assessment Assessed: Shirlyn Goltz: No] Patrice Paradise: Yes] Edema: [Left: Ye] [Right: s] Calf Left: Right: Point of Measurement: From Medial Instep 32 cm Ankle Left: Right: Point of Measurement: From Medial Instep 21 cm Vascular Assessment Pulses: Dorsalis Pedis Palpable: [Right:Yes] Posterior Tibial Palpable: [Right:Yes] Electronic Signature(s) Signed: 12/12/2020 6:12:45 PM By: Rhae Hammock RN Entered By: Rhae Hammock on 12/12/2020 15:36:10 -------------------------------------------------------------------------------- Multi Wound Chart Details Patient Name: Date of Service: Leah [name], Leah [name]. 12/12/2020 3:00 PM Medical Record Number: [id number] Patient Account Number: [id number] Date of Birth/Sex: Treating RN: 12/26/1929 (87 y.o. [name] Primary Care Kass Herberger: Pricilla Holm Other Clinician: Referring Lyndle Pang: Treating Vearl Aitken/Extender: Charlie Pitter in  Treatment: 9 Vital Signs Height(in): 60 Pulse(bpm): 81 Weight(lbs): 131 Blood Pressure(mmHg): 168/70 Body Mass Index(BMI): 26 Temperature(F): 98.8 Respiratory Rate(breaths/min): 17 Photos: [1:No Photos Right, Anterior Lower Leg] [N/A:N/A N/A] Wound Location: [1:Gradually Appeared] [N/A:N/A] Wounding Event: [1:Venous Leg Ulcer] [N/A:N/A] Primary Etiology: [1:06/02/2020] [N/A:N/A] Date Acquired: [1:9] [N/A:N/A] Weeks of Treatment: [1:Healed - Epithelialized] [N/A:N/A] Wound Status: [1:Yes] [N/A:N/A] Clustered Wound: [1:0x0x0] [N/A:N/A] Measurements L x W x D (cm) [1:0] [N/A:N/A] A (cm) : rea [1:0] [N/A:N/A] Volume (cm) : [1:100.00%] [N/A:N/A] % Reduction in A rea: [1:100.00%] [N/A:N/A] %  Reduction in Volume: [1:Full Thickness Without Exposed] [N/A:N/A] Classification: [1:Support Structures] Treatment Notes Electronic Signature(s) Signed: 12/12/2020 5:03:28 PM By: Linton Ham MD Signed: 12/13/2020 6:01:10 PM By: Levan Hurst RN, BSN Entered By: Linton Ham on 12/12/2020 15:57:36 -------------------------------------------------------------------------------- Multi-Disciplinary Care Plan Details Patient Name: Date of Service: Leah [name], Leah [name]. 12/12/2020 3:00 PM Medical Record Number: [id number] Patient Account Number: [id number] Date of Birth/Sex: Treating RN: Dec 06, 1929 (87 y.o. [name] Primary Care Avonna Iribe: Pricilla Holm Other Clinician: Referring Bethanie Bloxom: Treating Lizbet Cirrincione/Extender: Charlie Pitter in Treatment: 9 Active Inactive Electronic Signature(s) Signed: 12/13/2020 6:01:10 PM By: Levan Hurst RN, BSN Entered By: Levan Hurst on 12/12/2020 18:05:37 -------------------------------------------------------------------------------- Pain Assessment Details Patient Name: Date of Service: Leah [name], Asa Saunas NNE [name]. 12/12/2020 3:00 PM Medical Record Number: [id number] Patient Account Number: [id number] Date of  Birth/Sex: Treating RN: Mar 10, 1930 (87 y.o. Tonita Phoenix, Lauren Primary Care Aviraj Kentner: Pricilla Holm Other Clinician: Referring Gryphon Vanderveen: Treating Attilio Zeitler/Extender: Charlie Pitter in Treatment: 9 Active Problems Location of Pain Severity and Description of Pain Patient Has Paino No Site Locations Pain Management and Medication Current Pain Management: Electronic Signature(s) Signed: 12/12/2020 6:12:45 PM By: Rhae Hammock RN Entered By: Rhae Hammock on 12/12/2020 15:30:05 -------------------------------------------------------------------------------- Patient/Caregiver Education Details Patient Name: Date of Service: Leah [name] 7/13/2022andnbsp3:00 PM Medical Record Number: [id number] Patient Account Number: [id number] Date of Birth/Gender: Treating RN: [birth year] (87 y.o. [name] Primary Care Physician: Pricilla Holm Other Clinician: Referring Physician: Treating Physician/Extender: Charlie Pitter in Treatment: 9 Education Assessment Education Provided To: Patient Education Topics Provided Wound/Skin Impairment: Methods: Explain/Verbal Responses: State content correctly [employer]) Signed: 12/13/2020 6:01:10 PM By: Levan Hurst RN, BSN Entered By: Levan Hurst on 12/12/2020 18:01:29 -------------------------------------------------------------------------------- Wound Assessment Details Patient Name: Date of Service: Leah [name], Asa Saunas NNE [name]. 12/12/2020 3:00 PM Medical Record Number: [id number] Patient Account Number: [id number] Date of Birth/Sex: Treating RN: 1930/05/27 (87 y.o. Tonita Phoenix, Lauren Primary Care Dolce Sylvia: Pricilla Holm Other Clinician: Referring Sajjad Honea: Treating Rorey Bisson/Extender: Charlie Pitter in Treatment: 9 Wound Status Wound Number: 1 Primary Etiology: Venous Leg Ulcer Wound Location: Right,  Anterior Lower Leg Wound Status: Healed - Epithelialized Wounding Event: Gradually Appeared Comorbid History: Peripheral Venous Disease, Osteoarthritis Date Acquired: 06/02/2020 Weeks Of Treatment: 9 Clustered Wound: Yes Photos Wound Measurements Length: (cm) Width: (cm) Depth: (cm) Area: (cm) Volume: (cm) 0 % Reduction in Area: 100% 0 % Reduction in Volume: 100% 0 Epithelialization: Large (67-100%) 0 0 Wound Description Classification: Full Thickness Without Exposed Support Structures Wound Margin: Distinct, outline attached Exudate Amount: Medium Exudate Type: Serosanguineous Exudate Color: red, brown Foul Odor After Cleansing: No Slough/Fibrino Yes Wound Bed Granulation Amount: Medium (34-66%) Exposed Structure Granulation Quality: Pink, Pale Fascia Exposed: No Necrotic Amount: Medium (34-66%) Fat Layer (Subcutaneous Tissue) Exposed: Yes Necrotic Quality: Adherent Slough Tendon Exposed: No Muscle Exposed: No Joint Exposed: No Bone Exposed: No Electronic Signature(s) Signed: 12/13/2020 6:01:49 PM By: Rhae Hammock RN Signed: 12/17/2020 3:49:49 PM By: Sandre Kitty Previous Signature: 12/12/2020 6:12:45 PM Version By: Rhae Hammock RN Entered By: Sandre Kitty on 12/13/2020 08:40:29 -------------------------------------------------------------------------------- Vitals Details Patient Name: Date of Service: Leah [name], Leah [name]. 12/12/2020 3:00 PM Medical Record Number: [id number] Patient Account Number: [id number] Date of Birth/Sex: Treating RN: [birth year] (87 y.o. Tonita Phoenix, Lauren Primary Care Shifa Brisbon: Pricilla Holm Other Clinician: Referring Camry Robello: Treating Leniyah Martell/Extender: Charlie Pitter in Treatment: 9 Vital Signs Time Taken: 15:29 Temperature (F): 98.8 Height (in): 60 Pulse (  bpm): 74 Weight (lbs): 131 Respiratory Rate (breaths/min): 17 Body Mass Index (BMI): 25.6 Blood Pressure (mmHg):  168/70 Reference Range: 80 - 120 mg / dl Electronic Signature(s) Signed: 12/12/2020 6:12:45 PM By: Rhae Hammock RN Entered By: Rhae Hammock on 12/12/2020 15:30:00

## 2020-12-31 ENCOUNTER — Encounter: Payer: Self-pay | Admitting: Gastroenterology

## 2020-12-31 ENCOUNTER — Other Ambulatory Visit: Payer: Self-pay

## 2020-12-31 ENCOUNTER — Ambulatory Visit (AMBULATORY_SURGERY_CENTER): Payer: Medicare PPO | Admitting: Gastroenterology

## 2020-12-31 VITALS — BP 162/75 | HR 67 | Temp 98.2°F | Resp 13 | Ht 60.0 in | Wt 131.0 lb

## 2020-12-31 DIAGNOSIS — K319 Disease of stomach and duodenum, unspecified: Secondary | ICD-10-CM | POA: Diagnosis not present

## 2020-12-31 DIAGNOSIS — K222 Esophageal obstruction: Secondary | ICD-10-CM | POA: Diagnosis not present

## 2020-12-31 DIAGNOSIS — K2970 Gastritis, unspecified, without bleeding: Secondary | ICD-10-CM | POA: Diagnosis not present

## 2020-12-31 DIAGNOSIS — K449 Diaphragmatic hernia without obstruction or gangrene: Secondary | ICD-10-CM | POA: Diagnosis not present

## 2020-12-31 DIAGNOSIS — R1319 Other dysphagia: Secondary | ICD-10-CM

## 2020-12-31 DIAGNOSIS — K2100 Gastro-esophageal reflux disease with esophagitis, without bleeding: Secondary | ICD-10-CM

## 2020-12-31 DIAGNOSIS — K259 Gastric ulcer, unspecified as acute or chronic, without hemorrhage or perforation: Secondary | ICD-10-CM

## 2020-12-31 MED ORDER — PANTOPRAZOLE SODIUM 40 MG PO TBEC: 40.0000 mg | DELAYED_RELEASE_TABLET | Freq: Two times a day (BID) | ORAL | 11 refills | Status: DC

## 2020-12-31 MED ORDER — SODIUM CHLORIDE 0.9 % IV SOLN: 500.0000 mL | Freq: Once | INTRAVENOUS | Status: DC

## 2020-12-31 NOTE — Progress Notes (Signed)
Called to room to assist during endoscopic procedure.  Patient ID and intended procedure confirmed with present staff. Received instructions for my participation in the procedure from the performing physician.  

## 2020-12-31 NOTE — Patient Instructions (Signed)
Resume previous medications.  Await pathology for final recommendations.  Handouts on findings given to patient.     Follow dilation diet today.  Resume regular diet tomorrow as tolerated.    Change Protonix to twice a day. New prescription sent to pharmacy.    Return to GI office in 6 weeks for follow up.  The office will call you to schedule.   YOU HAD AN ENDOSCOPIC PROCEDURE TODAY AT [facility]:   Refer to the procedure report that was given to you for any specific questions about what was found during the examination.  If the procedure report does not answer your questions, please call your gastroenterologist to clarify.  If you requested that your care partner not be given the details of your procedure findings, then the procedure report has been included in a sealed envelope for you to review at your convenience later.  YOU SHOULD EXPECT: Some feelings of bloating in the abdomen. Passage of more gas than usual.  Walking can help get rid of the air that was put into your GI tract during the procedure and reduce the bloating. If you had a lower endoscopy (such as a colonoscopy or flexible sigmoidoscopy) you may notice spotting of blood in your stool or on the toilet paper. If you underwent a bowel prep for your procedure, you may not have a normal bowel movement for a few days.  Please Note:  You might notice some irritation and congestion in your nose or some drainage.  This is from the oxygen used during your procedure.  There is no need for concern and it should clear up in a day or so.  SYMPTOMS TO REPORT IMMEDIATELY:   Following upper endoscopy (EGD)  Vomiting of blood or coffee ground material  New chest pain or pain under the shoulder blades  Painful or persistently difficult swallowing  New shortness of breath  Fever of 100F or higher  Black, tarry-looking stools  For urgent or emergent issues, a gastroenterologist can be reached at any hour by calling [phone]  [phone]. Do not use MyChart messaging for urgent concerns.    DIET:  We do recommend a small meal at first, but then you may proceed to your regular diet.  Drink plenty of fluids but you should avoid alcoholic beverages for 24 hours.  ACTIVITY:  You should plan to take it easy for the rest of today and you should NOT DRIVE or use heavy machinery until tomorrow (because of the sedation medicines used during the test).    FOLLOW UP: Our staff will call the number listed on your records 48-72 hours following your procedure to check on you and address any questions or concerns that you may have regarding the information given to you following your procedure. If we do not reach you, we will leave a message.  We will attempt to reach you two times.  During this call, we will ask if you have developed any symptoms of COVID 19. If you develop any symptoms (ie: fever, flu-like symptoms, shortness of breath, cough etc.) before then, please call [phone].  If you test positive for Covid 19 in the 2 weeks post procedure, please call and report this information to [place].    If any biopsies were taken you will be contacted by phone or by letter within the next 1-3 weeks.  Please call us at [phone] if you have not heard about the biopsies in 3 weeks.    SIGNATURES/CONFIDENTIALITY: You and/or  your care partner have signed paperwork which will be entered into your electronic medical record.  These signatures attest to the fact that that the information above on your After Visit Summary has been reviewed and is understood.  Full responsibility of the confidentiality of this discharge information lies with you and/or your care-partner.

## 2020-12-31 NOTE — Progress Notes (Signed)
A and O x3. Report to RN. Tolerated MAC anesthesia well.Teeth unchanged after procedure. 

## 2020-12-31 NOTE — Op Note (Signed)
[facility] Patient Name: Leah [name] Procedure Date: 12/31/2020 9:55 AM MRN: [id number] Endoscopist: Ladene Artist , MD Age: 87 Referring MD:  Date of Birth: 17-Nov-1929 Gender: Female Account #: [id number] Procedure:                Upper GI endoscopy Indications:              Dysphagia, Reflux esophagitis Medicines:                Monitored Anesthesia Care Procedure:                Pre-Anesthesia Assessment:                           - Prior to the procedure, a History and Physical                            was performed, and patient medications and                            allergies were reviewed. The patient's tolerance of                            previous anesthesia was also reviewed. The risks                            and benefits of the procedure and the sedation                            options and risks were discussed with the patient.                            All questions were answered, and informed consent                            was obtained. Prior Anticoagulants: The patient has                            taken no previous anticoagulant or antiplatelet                            agents. ASA Grade Assessment: II - A patient with                            mild systemic disease. After reviewing the risks                            and benefits, the patient was deemed in                            satisfactory condition to undergo the procedure.                           After obtaining informed consent, the endoscope was  passed under direct vision. Throughout the                            procedure, the patient's blood pressure, pulse, and                            oxygen saturations were monitored continuously. The                            Endoscope was introduced through the mouth, and                            advanced to the second part of duodenum. The upper                            GI endoscopy was  accomplished without difficulty.                            The patient tolerated the procedure well. Scope In: Scope Out: Findings:                 LA Grade B (one or more mucosal breaks greater than                            5 mm, not extending between the tops of two mucosal                            folds) esophagitis with no bleeding was found at                            the gastroesophageal junction.                           One benign-appearing, intrinsic moderate stenosis                            was found at the gastroesophageal junction. This                            stenosis measured 1.2 cm (inner diameter) x less                            than one cm (in length). The stenosis was                            traversed. A guidewire was placed and the scope was                            withdrawn. Dilations were performed with Savary                            dilators with mild resistance at 13 mm, 14 mm and  15 mm. Heme noted with last 2 dilators. The                            dilation site was examined following endoscope                            reinsertion and showed moderate mucosal disruption                            and moderate improvement in luminal narrowing. No                            active bleeding.                           The exam of the esophagus was otherwise normal.                           A few localized medium erosions with no bleeding                            and no stigmata of recent bleeding were found in                            the gastric antrum. Biopsies were taken with a cold                            forceps for histology.                           Localized moderately erythematous mucosa without                            bleeding was found in the prepyloric region of the                            stomach. Biopsies were taken with a cold forceps                            for histology.                            A medium-sized hiatal hernia was present.                           The exam of the stomach was otherwise normal.                           The duodenal bulb and second portion of the                            duodenum were normal. Complications:            No immediate complications. Estimated Blood Loss:     Estimated blood loss was minimal. Impression:               -  LA Grade B reflux esophagitis with no bleeding.                           - Benign-appearing esophageal stenosis. Dilated.                           - Erosive gastropathy with no bleeding and no                            stigmata of recent bleeding. Biopsied.                           - Erythematous mucosa in the prepyloric region of                            the stomach.                           - Medium-sized hiatal hernia.                           - Normal duodenal bulb and second portion of the                            duodenum. Recommendation:           - Patient has a contact number available for                            emergencies. The signs and symptoms of potential                            delayed complications were discussed with the                            patient. Return to normal activities tomorrow.                            Written discharge instructions were provided to the                            patient.                           - Clear liquid diet for 2 hours, then advance as                            tolerated to soft diet today.                           - Resume prior diet tomorrow.                           - Follow antireflux measures long term.                           -  Pantoprazole 40 mg po bid, 1 year of refills.                           - Continue present medications.                           - Await pathology results.                           - Return to GI office in 6 weeks. Ladene Artist, MD 12/31/2020 10:30:12 AM This report has been signed  electronically.

## 2021-01-01 ENCOUNTER — Other Ambulatory Visit: Payer: Self-pay | Admitting: Internal Medicine

## 2021-01-02 ENCOUNTER — Telehealth: Payer: Self-pay | Admitting: *Deleted

## 2021-01-02 NOTE — Telephone Encounter (Signed)
  Follow up Call-  Call back number 12/31/2020 08/06/2018 07/12/2018  Post procedure Call Back phone  # [phone]  Permission to leave phone message Yes Yes Yes  Some recent data might be hidden     Patient questions:  Do you have a fever, pain , or abdominal swelling? No. Pain Score  0 *  Have you tolerated food without any problems? Yes.    Have you been able to return to your normal activities? Yes.    Do you have any questions about your discharge instructions: Diet   No. Medications  No. Follow up visit  No.  Do you have questions or concerns about your Care? Yes.    Actions: * If pain score is 4 or above: No action needed, pain <4.  Have you developed a fever since your procedure? no  2.   Have you had an respiratory symptoms (SOB or cough) since your procedure? no  3.   Have you tested positive for COVID 19 since your procedure no  4.   Have you had any family members/close contacts diagnosed with the COVID 19 since your procedure?  no   If yes to any of these questions please route to Joylene John, RN and Joella Prince, RN

## 2021-01-03 ENCOUNTER — Ambulatory Visit: Payer: Medicare PPO | Admitting: Internal Medicine

## 2021-01-03 ENCOUNTER — Other Ambulatory Visit: Payer: Self-pay

## 2021-01-03 ENCOUNTER — Encounter: Payer: Self-pay | Admitting: Internal Medicine

## 2021-01-03 VITALS — BP 134/74 | HR 82 | Temp 97.7°F | Resp 18 | Ht 60.0 in | Wt 133.6 lb

## 2021-01-03 DIAGNOSIS — R609 Edema, unspecified: Secondary | ICD-10-CM | POA: Diagnosis not present

## 2021-01-03 DIAGNOSIS — N1832 Chronic kidney disease, stage 3b: Secondary | ICD-10-CM

## 2021-01-03 DIAGNOSIS — M545 Low back pain, unspecified: Secondary | ICD-10-CM

## 2021-01-03 DIAGNOSIS — G8929 Other chronic pain: Secondary | ICD-10-CM | POA: Diagnosis not present

## 2021-01-03 DIAGNOSIS — I872 Venous insufficiency (chronic) (peripheral): Secondary | ICD-10-CM

## 2021-01-03 LAB — COMPREHENSIVE METABOLIC PANEL
ALT: 16 U/L (ref 0–35)
AST: 21 U/L (ref 0–37)
Albumin: 3.8 g/dL (ref 3.5–5.2)
Alkaline Phosphatase: 86 U/L (ref 39–117)
BUN: 23 mg/dL (ref 6–23)
CO2: 28 mEq/L (ref 19–32)
Calcium: 9.4 mg/dL (ref 8.4–10.5)
Chloride: 106 mEq/L (ref 96–112)
Creatinine, Ser: 1.17 mg/dL (ref 0.40–1.20)
GFR: 41.64 mL/min — ABNORMAL LOW (ref >60.00)
Glucose, Bld: 131 mg/dL — ABNORMAL HIGH (ref 70–99)
Potassium: 4 mEq/L (ref 3.5–5.1)
Sodium: 143 mEq/L (ref 135–145)
Total Bilirubin: 0.6 mg/dL (ref 0.2–1.2)
Total Protein: 6.6 g/dL (ref 6.0–8.3)

## 2021-01-03 LAB — CBC
HCT: 35 % — ABNORMAL LOW (ref 36.0–46.0)
Hemoglobin: 11.5 g/dL — ABNORMAL LOW (ref 12.0–15.0)
MCHC: 32.7 g/dL (ref 30.0–36.0)
MCV: 88.5 fl (ref 78.0–100.0)
Platelets: 207 10*3/uL (ref 150.0–400.0)
RBC: 3.96 Mil/uL (ref 3.87–5.11)
RDW: 15.8 % — ABNORMAL HIGH (ref 11.5–15.5)
WBC: 5 10*3/uL (ref 4.0–10.5)

## 2021-01-03 NOTE — Progress Notes (Signed)
   Subjective:   Patient ID: Leah [name], female    DOB: Nov 21, 1929, 87 y.o.   MRN: [id number]  HPI The patient is an 87 YO female coming in for balance concerns. She has not fallen recently but multiple falls in the past. With her recent swelling in her legs and having to go to wound care for 10 weeks (recently discharged) she feels she has worsened and is weaker than before. She needs some PT at home to help with balance and is not safe to leave home alone. She is wanting to watch her BP as it was up sometimes at the wound center.   Review of Systems  Constitutional:  Positive for activity change and fatigue. Negative for appetite change, chills, fever and unexpected weight change.  HENT: Negative.    Eyes: Negative.   Respiratory:  Negative for cough, chest tightness and shortness of breath.   Cardiovascular:  Negative for chest pain, palpitations and leg swelling.  Gastrointestinal:  Negative for abdominal distention, abdominal pain, constipation, diarrhea, nausea and vomiting.  Musculoskeletal:  Positive for back pain and gait problem.  Skin: Negative.   Neurological:  Negative for dizziness, light-headedness and headaches.  Psychiatric/Behavioral: Negative.     Objective:  Physical Exam Constitutional:      Appearance: She is well-developed.     Comments: thin  HENT:     Head: Normocephalic and atraumatic.  Cardiovascular:     Rate and Rhythm: Normal rate and regular rhythm.  Pulmonary:     Effort: Pulmonary effort is normal. No respiratory distress.     Breath sounds: Normal breath sounds. No wheezing or rales.  Abdominal:     General: Bowel sounds are normal. There is no distension.     Palpations: Abdomen is soft.     Tenderness: There is no abdominal tenderness. There is no rebound.  Musculoskeletal:     Cervical back: Normal range of motion.  Skin:    General: Skin is warm and dry.  Neurological:     Mental Status: She is alert and oriented to person, place,  and time.     Coordination: Coordination abnormal.     Comments: Cane for ambulation, slow to rise    Vitals:   01/03/21 1016  BP: 134/74  Pulse: 82  Resp: 18  Temp: 97.7 F (36.5 C)  TempSrc: Oral  SpO2: 98%  Weight: 133 lb 9.6 oz (60.6 kg)  Height: 5' (1.524 m)    This visit occurred during the SARS-CoV-2 public health emergency.  Safety protocols were in place, including screening questions prior to the visit, additional usage of staff PPE, and extensive cleaning of exam room while observing appropriate contact time as indicated for disinfecting solutions.   Assessment & Plan:

## 2021-01-03 NOTE — Patient Instructions (Addendum)
We will check the blood flow in the legs to see if the fluid can be helped.   We will have therapy coming to the house.  Prolia is due September 2nd or later.

## 2021-01-04 ENCOUNTER — Encounter: Payer: Self-pay | Admitting: Internal Medicine

## 2021-01-04 NOTE — Assessment & Plan Note (Signed)
Needs CMP and CBC for every 6 months for monitoring. BP at goal and no diabetes likely related to aging.

## 2021-01-04 NOTE — Assessment & Plan Note (Signed)
Causing some decrease in mobility and needs PT to help with pain and increase function. Ordered home health.

## 2021-01-04 NOTE — Assessment & Plan Note (Signed)
Checking [place] for venous reflux to see if there is an amenable lesion for vascular referral. She is using compression device provided by wound center.

## 2021-01-07 ENCOUNTER — Other Ambulatory Visit: Payer: Self-pay

## 2021-01-07 ENCOUNTER — Ambulatory Visit (HOSPITAL_COMMUNITY)
Admission: RE | Admit: 2021-01-07 | Discharge: 2021-01-07 | Disposition: A | Discharge status: Home / Self Care | Payer: Medicare PPO | Source: Ambulatory Visit | Attending: Cardiovascular Disease | Admitting: Cardiovascular Disease

## 2021-01-07 DIAGNOSIS — R609 Edema, unspecified: Secondary | ICD-10-CM | POA: Insufficient documentation

## 2021-01-07 DIAGNOSIS — I872 Venous insufficiency (chronic) (peripheral): Secondary | ICD-10-CM

## 2021-01-07 DIAGNOSIS — R600 Localized edema: Secondary | ICD-10-CM

## 2021-01-09 ENCOUNTER — Telehealth: Payer: Self-pay | Admitting: Internal Medicine

## 2021-01-09 NOTE — Telephone Encounter (Signed)
   Please call patient to discuss venous results. She os ok with being referred to vascular specialist

## 2021-01-10 ENCOUNTER — Other Ambulatory Visit: Payer: Self-pay | Admitting: Internal Medicine

## 2021-01-10 DIAGNOSIS — I872 Venous insufficiency (chronic) (peripheral): Secondary | ICD-10-CM

## 2021-01-10 NOTE — Telephone Encounter (Signed)
See result note.  

## 2021-01-15 ENCOUNTER — Telehealth: Payer: Self-pay

## 2021-01-15 DIAGNOSIS — M810 Age-related osteoporosis without current pathological fracture: Secondary | ICD-10-CM | POA: Diagnosis not present

## 2021-01-15 DIAGNOSIS — J309 Allergic rhinitis, unspecified: Secondary | ICD-10-CM | POA: Diagnosis not present

## 2021-01-15 DIAGNOSIS — K589 Irritable bowel syndrome without diarrhea: Secondary | ICD-10-CM | POA: Diagnosis not present

## 2021-01-15 DIAGNOSIS — M545 Low back pain, unspecified: Secondary | ICD-10-CM | POA: Diagnosis not present

## 2021-01-15 DIAGNOSIS — M1990 Unspecified osteoarthritis, unspecified site: Secondary | ICD-10-CM | POA: Diagnosis not present

## 2021-01-15 DIAGNOSIS — K219 Gastro-esophageal reflux disease without esophagitis: Secondary | ICD-10-CM | POA: Diagnosis not present

## 2021-01-15 DIAGNOSIS — I872 Venous insufficiency (chronic) (peripheral): Secondary | ICD-10-CM | POA: Diagnosis not present

## 2021-01-15 DIAGNOSIS — G8929 Other chronic pain: Secondary | ICD-10-CM | POA: Diagnosis not present

## 2021-01-15 DIAGNOSIS — N1832 Chronic kidney disease, stage 3b: Secondary | ICD-10-CM | POA: Diagnosis not present

## 2021-01-15 NOTE — Telephone Encounter (Signed)
2* for 6 weeks 1*2 weeks  To work on balance, strength, and fall prevention.   Shanon Brow with [facility] [phone]

## 2021-01-17 DIAGNOSIS — K219 Gastro-esophageal reflux disease without esophagitis: Secondary | ICD-10-CM | POA: Diagnosis not present

## 2021-01-17 DIAGNOSIS — I872 Venous insufficiency (chronic) (peripheral): Secondary | ICD-10-CM | POA: Diagnosis not present

## 2021-01-17 DIAGNOSIS — G8929 Other chronic pain: Secondary | ICD-10-CM | POA: Diagnosis not present

## 2021-01-17 DIAGNOSIS — M810 Age-related osteoporosis without current pathological fracture: Secondary | ICD-10-CM | POA: Diagnosis not present

## 2021-01-17 DIAGNOSIS — K589 Irritable bowel syndrome without diarrhea: Secondary | ICD-10-CM | POA: Diagnosis not present

## 2021-01-17 DIAGNOSIS — M545 Low back pain, unspecified: Secondary | ICD-10-CM | POA: Diagnosis not present

## 2021-01-17 DIAGNOSIS — M1990 Unspecified osteoarthritis, unspecified site: Secondary | ICD-10-CM | POA: Diagnosis not present

## 2021-01-17 DIAGNOSIS — N1832 Chronic kidney disease, stage 3b: Secondary | ICD-10-CM | POA: Diagnosis not present

## 2021-01-17 DIAGNOSIS — J309 Allergic rhinitis, unspecified: Secondary | ICD-10-CM | POA: Diagnosis not present

## 2021-01-18 ENCOUNTER — Encounter: Payer: Self-pay | Admitting: Gastroenterology

## 2021-01-22 DIAGNOSIS — M1990 Unspecified osteoarthritis, unspecified site: Secondary | ICD-10-CM | POA: Diagnosis not present

## 2021-01-22 DIAGNOSIS — J309 Allergic rhinitis, unspecified: Secondary | ICD-10-CM | POA: Diagnosis not present

## 2021-01-22 DIAGNOSIS — I872 Venous insufficiency (chronic) (peripheral): Secondary | ICD-10-CM | POA: Diagnosis not present

## 2021-01-22 DIAGNOSIS — G8929 Other chronic pain: Secondary | ICD-10-CM | POA: Diagnosis not present

## 2021-01-22 DIAGNOSIS — K219 Gastro-esophageal reflux disease without esophagitis: Secondary | ICD-10-CM | POA: Diagnosis not present

## 2021-01-22 DIAGNOSIS — M545 Low back pain, unspecified: Secondary | ICD-10-CM | POA: Diagnosis not present

## 2021-01-22 DIAGNOSIS — K589 Irritable bowel syndrome without diarrhea: Secondary | ICD-10-CM | POA: Diagnosis not present

## 2021-01-22 DIAGNOSIS — M810 Age-related osteoporosis without current pathological fracture: Secondary | ICD-10-CM | POA: Diagnosis not present

## 2021-01-22 DIAGNOSIS — N1832 Chronic kidney disease, stage 3b: Secondary | ICD-10-CM | POA: Diagnosis not present

## 2021-01-22 NOTE — Telephone Encounter (Signed)
Fine for verbals °

## 2021-01-22 NOTE — Telephone Encounter (Signed)
See below

## 2021-01-23 NOTE — Telephone Encounter (Signed)
Verbal orders given to Shanon Brow with [facility].

## 2021-01-24 DIAGNOSIS — M545 Low back pain, unspecified: Secondary | ICD-10-CM | POA: Diagnosis not present

## 2021-01-24 DIAGNOSIS — M1990 Unspecified osteoarthritis, unspecified site: Secondary | ICD-10-CM | POA: Diagnosis not present

## 2021-01-24 DIAGNOSIS — M810 Age-related osteoporosis without current pathological fracture: Secondary | ICD-10-CM | POA: Diagnosis not present

## 2021-01-24 DIAGNOSIS — G8929 Other chronic pain: Secondary | ICD-10-CM | POA: Diagnosis not present

## 2021-01-24 DIAGNOSIS — I872 Venous insufficiency (chronic) (peripheral): Secondary | ICD-10-CM | POA: Diagnosis not present

## 2021-01-24 DIAGNOSIS — K589 Irritable bowel syndrome without diarrhea: Secondary | ICD-10-CM | POA: Diagnosis not present

## 2021-01-24 DIAGNOSIS — N1832 Chronic kidney disease, stage 3b: Secondary | ICD-10-CM | POA: Diagnosis not present

## 2021-01-24 DIAGNOSIS — J309 Allergic rhinitis, unspecified: Secondary | ICD-10-CM | POA: Diagnosis not present

## 2021-01-24 DIAGNOSIS — K219 Gastro-esophageal reflux disease without esophagitis: Secondary | ICD-10-CM | POA: Diagnosis not present

## 2021-01-30 DIAGNOSIS — K589 Irritable bowel syndrome without diarrhea: Secondary | ICD-10-CM | POA: Diagnosis not present

## 2021-01-30 DIAGNOSIS — N1832 Chronic kidney disease, stage 3b: Secondary | ICD-10-CM | POA: Diagnosis not present

## 2021-01-30 DIAGNOSIS — I872 Venous insufficiency (chronic) (peripheral): Secondary | ICD-10-CM | POA: Diagnosis not present

## 2021-01-30 DIAGNOSIS — G8929 Other chronic pain: Secondary | ICD-10-CM | POA: Diagnosis not present

## 2021-01-30 DIAGNOSIS — M810 Age-related osteoporosis without current pathological fracture: Secondary | ICD-10-CM | POA: Diagnosis not present

## 2021-01-30 DIAGNOSIS — M1990 Unspecified osteoarthritis, unspecified site: Secondary | ICD-10-CM | POA: Diagnosis not present

## 2021-01-30 DIAGNOSIS — K219 Gastro-esophageal reflux disease without esophagitis: Secondary | ICD-10-CM | POA: Diagnosis not present

## 2021-01-30 DIAGNOSIS — J309 Allergic rhinitis, unspecified: Secondary | ICD-10-CM | POA: Diagnosis not present

## 2021-01-30 DIAGNOSIS — M545 Low back pain, unspecified: Secondary | ICD-10-CM | POA: Diagnosis not present

## 2021-02-01 ENCOUNTER — Other Ambulatory Visit: Payer: Self-pay

## 2021-02-01 ENCOUNTER — Ambulatory Visit (INDEPENDENT_AMBULATORY_CARE_PROVIDER_SITE_OTHER): Payer: Medicare PPO

## 2021-02-01 DIAGNOSIS — M810 Age-related osteoporosis without current pathological fracture: Secondary | ICD-10-CM | POA: Diagnosis not present

## 2021-02-01 DIAGNOSIS — Z23 Encounter for immunization: Secondary | ICD-10-CM

## 2021-02-01 MED ORDER — DENOSUMAB 60 MG/ML ~~LOC~~ SOSY
60.0000 mg | PREFILLED_SYRINGE | Freq: Once | SUBCUTANEOUS | Status: AC
  Administered 2021-02-01: 60 mg via SUBCUTANEOUS

## 2021-02-01 NOTE — Progress Notes (Signed)
Prolia given w/o any complications.  **High Dose Flu vacc given w/o complications.

## 2021-02-05 ENCOUNTER — Ambulatory Visit: Payer: Medicare PPO | Admitting: Internal Medicine

## 2021-02-05 DIAGNOSIS — J309 Allergic rhinitis, unspecified: Secondary | ICD-10-CM | POA: Diagnosis not present

## 2021-02-05 DIAGNOSIS — M545 Low back pain, unspecified: Secondary | ICD-10-CM | POA: Diagnosis not present

## 2021-02-05 DIAGNOSIS — K219 Gastro-esophageal reflux disease without esophagitis: Secondary | ICD-10-CM | POA: Diagnosis not present

## 2021-02-05 DIAGNOSIS — M810 Age-related osteoporosis without current pathological fracture: Secondary | ICD-10-CM | POA: Diagnosis not present

## 2021-02-05 DIAGNOSIS — G8929 Other chronic pain: Secondary | ICD-10-CM | POA: Diagnosis not present

## 2021-02-05 DIAGNOSIS — N1832 Chronic kidney disease, stage 3b: Secondary | ICD-10-CM | POA: Diagnosis not present

## 2021-02-05 DIAGNOSIS — K589 Irritable bowel syndrome without diarrhea: Secondary | ICD-10-CM | POA: Diagnosis not present

## 2021-02-05 DIAGNOSIS — I872 Venous insufficiency (chronic) (peripheral): Secondary | ICD-10-CM | POA: Diagnosis not present

## 2021-02-05 DIAGNOSIS — M1990 Unspecified osteoarthritis, unspecified site: Secondary | ICD-10-CM | POA: Diagnosis not present

## 2021-02-07 DIAGNOSIS — J309 Allergic rhinitis, unspecified: Secondary | ICD-10-CM | POA: Diagnosis not present

## 2021-02-07 DIAGNOSIS — K589 Irritable bowel syndrome without diarrhea: Secondary | ICD-10-CM | POA: Diagnosis not present

## 2021-02-07 DIAGNOSIS — M810 Age-related osteoporosis without current pathological fracture: Secondary | ICD-10-CM | POA: Diagnosis not present

## 2021-02-07 DIAGNOSIS — I872 Venous insufficiency (chronic) (peripheral): Secondary | ICD-10-CM | POA: Diagnosis not present

## 2021-02-07 DIAGNOSIS — K219 Gastro-esophageal reflux disease without esophagitis: Secondary | ICD-10-CM | POA: Diagnosis not present

## 2021-02-07 DIAGNOSIS — N1832 Chronic kidney disease, stage 3b: Secondary | ICD-10-CM | POA: Diagnosis not present

## 2021-02-07 DIAGNOSIS — M1990 Unspecified osteoarthritis, unspecified site: Secondary | ICD-10-CM | POA: Diagnosis not present

## 2021-02-07 DIAGNOSIS — M545 Low back pain, unspecified: Secondary | ICD-10-CM | POA: Diagnosis not present

## 2021-02-07 DIAGNOSIS — G8929 Other chronic pain: Secondary | ICD-10-CM | POA: Diagnosis not present

## 2021-02-11 ENCOUNTER — Telehealth: Payer: Self-pay

## 2021-02-11 NOTE — Telephone Encounter (Signed)
Please advise as the pt has asked was the forms sent out to the city of [place] for assistance with bringing her trash cans to and from the street as she is unable to do so. Pt states she thinks she gave the wrong mailing address for the forms to be mailed to and would like to know if any copies were made and can be sent to the right address. If so please call pt at the number below.  Pt can be reached at [phone].

## 2021-02-12 NOTE — Telephone Encounter (Signed)
Form has been placed in Dr. Nathanial Millman box for her signature.

## 2021-02-13 ENCOUNTER — Telehealth: Payer: Self-pay | Admitting: Internal Medicine

## 2021-02-13 DIAGNOSIS — J309 Allergic rhinitis, unspecified: Secondary | ICD-10-CM | POA: Diagnosis not present

## 2021-02-13 DIAGNOSIS — M810 Age-related osteoporosis without current pathological fracture: Secondary | ICD-10-CM | POA: Diagnosis not present

## 2021-02-13 DIAGNOSIS — M545 Low back pain, unspecified: Secondary | ICD-10-CM | POA: Diagnosis not present

## 2021-02-13 DIAGNOSIS — M1990 Unspecified osteoarthritis, unspecified site: Secondary | ICD-10-CM | POA: Diagnosis not present

## 2021-02-13 DIAGNOSIS — K219 Gastro-esophageal reflux disease without esophagitis: Secondary | ICD-10-CM | POA: Diagnosis not present

## 2021-02-13 DIAGNOSIS — K589 Irritable bowel syndrome without diarrhea: Secondary | ICD-10-CM | POA: Diagnosis not present

## 2021-02-13 DIAGNOSIS — G8929 Other chronic pain: Secondary | ICD-10-CM | POA: Diagnosis not present

## 2021-02-13 DIAGNOSIS — I872 Venous insufficiency (chronic) (peripheral): Secondary | ICD-10-CM | POA: Diagnosis not present

## 2021-02-13 DIAGNOSIS — N1832 Chronic kidney disease, stage 3b: Secondary | ICD-10-CM | POA: Diagnosis not present

## 2021-02-13 NOTE — Telephone Encounter (Signed)
David from [facility] called   Reported patient's BP was high today. They took multiple readings during her visit but they all averaged around 160/80. Denies sx but stated she did not sleep well last night so she was feeling tired.   Best callback #: [phone]

## 2021-02-14 ENCOUNTER — Ambulatory Visit (INDEPENDENT_AMBULATORY_CARE_PROVIDER_SITE_OTHER): Payer: Medicare PPO | Admitting: Physician Assistant

## 2021-02-14 ENCOUNTER — Other Ambulatory Visit: Payer: Self-pay

## 2021-02-14 VITALS — BP 154/81 | HR 87 | Temp 97.9°F | Resp 14 | Ht 60.0 in | Wt 131.0 lb

## 2021-02-14 DIAGNOSIS — I872 Venous insufficiency (chronic) (peripheral): Secondary | ICD-10-CM

## 2021-02-14 NOTE — Progress Notes (Signed)
Requested by:  Hoyt Koch, MD [id number] [place],  [place] [address]  Reason for consultation: venous stasis    History of Present Illness   Leah [name] is a 87 y.o. (Feb 19, 1930) female who presents for evaluation of CEAP C5 venous disease.  In April of this year, she underwent approximately 6 weeks of venous stasis ulcer therapy at the wound care center by Dr. Dellia Nims.  She has a history of chronic lower extremity edema.  Her ulcers have fully healed.  She continues to wear compressive stockings and wraps.  She has no leg pain.  No previous vein procedures  Venous symptoms include: positive if (X) [  ] aching [  ] heavy [  ] tired  [  ] throbbing [  ] burning  [  ] itching [ x ]swelling [  ] bleeding [  ] ulcer  Onset/duration:  chronic/months  Occupation:  retired Aggravating factors: upright Alleviating factors: elevation Compression:  yes Helps:  yes Pain medications:  Voltaren Previous vein procedures:  no History of DVT:  no  Past Medical History:  Diagnosis Date   Acute maxillary sinusitis    Allergy    Bundle branch block, unspecified    EKG 4/13 with clearance and note that isnt new block Dr Arnoldo Morale on chart   Carpal tunnel syndrome    Cystocele    Detrusor instability    GERD (gastroesophageal reflux disease)    Glaucoma    Hyperlipidemia    Localized osteoarthrosis not specified whether primary or secondary, lower leg    Osteoporosis 01/2014   T score -2.5 UD forarm, stable at the spine recommend 2 year follow up DEXA   PONV (postoperative nausea and vomiting)    19 yrs ago- states has had general anesthesia since and did well   Unspecified adverse effect of unspecified drug, medicinal and biological substance    Uterine prolapse     Past Surgical History:  Procedure Laterality Date   arthroscopic r knee     CARPAL TUNNEL RELEASE  2011   COLONOSCOPY     DILATION AND CURETTAGE OF UTERUS  1982   EYE SURGERY     bilateral  cataract extraction  with IOL   FLEXIBLE SIGMOIDOSCOPY     HYSTEROSCOPY     HYSTEROSCOPY WITH D & C  2005 and 2008   IR SACROPLASTY BILATERAL  06/07/2020   JOINT REPLACEMENT     bilateral total hip arthroplasty   total hip rerplacement bilateral     TOTAL KNEE ARTHROPLASTY  10/13/2011   Procedure: TOTAL KNEE ARTHROPLASTY;  Surgeon: Gearlean Alf, MD;  Location: [facility];  Service: Orthopedics;  Laterality: Right;   UPPER GI ENDOSCOPY  07/2018 and 3/20    Social History   Socioeconomic History   Marital status: Married    Spouse name: Not on file   Number of children: 2   Years of education: Not on file   Highest education level: Not on file  Occupational History   Occupation: retired  Tobacco Use   Smoking status: Former    Types: Cigarettes    Quit date: 10/06/1958    Years since quitting: 62.4   Smokeless tobacco: Never  Vaping Use   Vaping Use: Never used  Substance and Sexual Activity   Alcohol use: Yes    Comment: once a year   Drug use: No   Sexual activity: Never    Comment: 1st intercourse 28 yo-1 partner  Other Topics Concern   Not on file  Social History Narrative   Not on file   Social Determinants of Health   Financial Resource Strain: Low Risk    Difficulty of Paying Living Expenses: Not hard at all  Food Insecurity: No Food Insecurity   Worried About Charity fundraiser in the Last Year: Never true   [place] in the Last Year: Never true  Transportation Needs: No Transportation Needs   Lack of Transportation (Medical): No   Lack of Transportation (Non-Medical): No  Physical Activity: Inactive   Days of Exercise per Week: 0 days   Minutes of Exercise per Session: 0 min  Stress: No Stress Concern Present   Feeling of Stress : Not at all  Social Connections: Not on file  Intimate Partner Violence: Not on file    Family History  Problem Relation Age of Onset   Dementia Mother    Breast cancer Mother 54   Heart disease Father    Stroke  Father    Dementia Sister    Colon cancer Neg Hx    Stomach cancer Neg Hx    Rectal cancer Neg Hx    Esophageal cancer Neg Hx    Colon polyps Neg Hx    Pancreatic cancer Neg Hx     Current Outpatient Medications  Medication Sig Dispense Refill   acetaminophen (TYLENOL) 650 MG CR tablet Take 1,300 mg by mouth every 8 (eight) hours as needed for pain.     Apoaequorin (PREVAGEN) 10 MG CAPS Take 10 mg by mouth daily.     Ascorbic Acid (VITAMIN C) 1000 MG tablet Take 1,000 mg by mouth daily.     aspirin EC 81 MG tablet Take 81 mg by mouth daily. Swallow whole.     Azelastine-Fluticasone 137-50 MCG/ACT SUSP USE TWO SPRAYS IN EACH NOTSTRIL DAILY 23 g 6   CALCIUM-MAGNESIUM-ZINC PO Take 3 tablets by mouth daily.     Cholecalciferol (DIALYVITE VITAMIN D 5000) 125 MCG (5000 UT) capsule Take 5,000 Units by mouth daily.     denosumab (PROLIA) 60 MG/ML SOLN injection Inject 60 mg into the skin every 6 (six) months. Administer in upper arm, thigh, or abdomen     diclofenac (VOLTAREN) 75 MG EC tablet TAKE ONE TABLET BY MOUTH TWICE DAILY 60 tablet 3   diclofenac Sodium (VOLTAREN) 1 % GEL Apply 2 g topically 4 (four) times daily. (Patient not taking: No sig reported) 100 g 3   furosemide (LASIX) 20 MG tablet Take 1 tablet (20 mg total) by mouth 2 (two) times daily. (Patient not taking: No sig reported) 180 tablet 1   latanoprost (XALATAN) 0.005 % ophthalmic solution Place 1 drop into both eyes at bedtime.     Multiple Vitamins-Minerals (OCUVITE ADULT 50+ PO) Take 1 tablet by mouth daily.     pantoprazole (PROTONIX) 40 MG tablet TAKE 1 TABLET ONCE DAILY. 90 tablet 1   pantoprazole (PROTONIX) 40 MG tablet Take 1 tablet (40 mg total) by mouth 2 (two) times daily. 60 tablet 11   silver sulfADIAZINE (SILVADENE) 1 % cream Apply 1 application topically daily. (Patient not taking: No sig reported) 50 g 0   timolol (TIMOPTIC) 0.5 % ophthalmic solution Place 1 drop into both eyes daily.     vitamin E 400 UNIT  capsule Take 400 Units by mouth daily.     No current facility-administered medications for this visit.    Allergies  Allergen Reactions   Sulfa Antibiotics  Rash   Sulfasalazine Hives and Rash   Nabumetone Other (See Comments)    Feel weird     Naproxen Sodium Hives   Tramadol Hcl Nausea And Vomiting    REVIEW OF SYSTEMS (negative unless checked):   Cardiac:  '[]'$  Chest pain or chest pressure? '[]'$  Shortness of breath upon activity? '[]'$  Shortness of breath when lying flat? '[]'$  Irregular heart rhythm?  Vascular:  '[]'$  Pain in calf, thigh, or hip brought on by walking? '[]'$  Pain in feet at night that wakes you up from your sleep? '[]'$  Blood clot in your veins? '[x]'$  Leg swelling?  Pulmonary:  '[]'$  Oxygen at home? '[]'$  Productive cough? '[]'$  Wheezing?  Neurologic:  '[]'$  Sudden weakness in arms or legs? '[]'$  Sudden numbness in arms or legs? '[]'$  Sudden onset of difficult speaking or slurred speech? '[]'$  Temporary loss of vision in one eye? '[]'$  Problems with dizziness?  Gastrointestinal:  '[]'$  Blood in stool? '[]'$  Vomited blood?  Genitourinary:  '[]'$  Burning when urinating? '[]'$  Blood in urine?  Psychiatric:  '[]'$  Major depression  Hematologic:  '[]'$  Bleeding problems? '[]'$  Problems with blood clotting?  Dermatologic:  '[]'$  Rashes or ulcers?  Constitutional:  '[]'$  Fever or chills?  Ear/Nose/Throat:  '[]'$  Change in hearing? '[]'$  Nose bleeds? '[]'$  Sore throat?  Musculoskeletal:  '[]'$  Back pain? '[]'$  Joint pain? '[]'$  Muscle pain?   Physical Examination     Vitals:   02/14/21 1512  BP: (!) 154/81  Pulse: 87  Resp: 14  Temp: 97.9 F (36.6 C)  SpO2: 96%     General:  WDWN in NAD; vital signs documented above Gait: with footed cane HENT: WNL, normocephalic Pulmonary: normal non-labored breathing  Cardiac: regular HR, Abdomen: soft, NT, no masses Skin: without rashes Vascular Exam/Pulses: 2+ dorsalis pedis, posterior tibial pulses bilaterally Extremities: without varicose veins, without  reticular veins, with edema, with stasis pigmentation, with lipodermatosclerosis, without ulcers.  She has evidence of healed venous stasis ulcers of both ankles. Musculoskeletal: no muscle wasting or atrophy  Neurologic: A&O X 3;  No focal weakness or paresthesias are detected Psychiatric:  The pt has Normal affect.  Non-invasive Vascular Imaging   BLE Venous Insufficiency Duplex  02/14/2021 Summary:  Bilateral:  - No evidence of deep vein thrombosis seen in the lower extremities,  bilaterally, from the common femoral through the popliteal veins.  - No evidence of superficial venous thrombosis in the lower extremities,  bilaterally.     Right:  - Venous reflux is noted in the right greater saphenous vein in the thigh.  - Venous reflux is noted in the right greater saphenous vein in the calf.     Left:  - Venous reflux is noted in the left greater saphenous vein in the calf.  - Calcifications noted in the great saphenous vein in the mid to distal  calf area.     *See table(s) above for measurements and observations.   Electronically signed by Kathlyn Sacramento MD on 01/09/2021 at 5:28:47 PM.       Final    Medical Decision Making   Leah [name] is a 87 y.o. female who presents with: BLE chronic venous insufficiency, CEAP C5 disease.  She has mild edema today and ulcers are well healed. Venous duplex reveals venous reflux in the right greater saphenous vein and left greater saphenous vein in the calf.  These veins are not sufficiently dilated to warrant EVLA. Based on the patient's history and examination, I recommend: Avoid prolonged sitting standing, daily elevation above  heart.  The patient is given written information and instructions.. I discussed with the patient the use of her 20-30 mm compression stockings/Circaid wraps and to don these prior to getting OOB each morning. Strongly encouraged her to use these when traveling, to take frequent breaks and walk and to stay well  hydrated.  Thank you for allowing [place] to participate in this patient's care.   Barbie Banner, PA-C [facility] Office: [phone]  02/14/2021, 3:14 PM  Clinic MD: Dr. Stanford Breed on call

## 2021-02-15 DIAGNOSIS — G8929 Other chronic pain: Secondary | ICD-10-CM | POA: Diagnosis not present

## 2021-02-15 DIAGNOSIS — J309 Allergic rhinitis, unspecified: Secondary | ICD-10-CM | POA: Diagnosis not present

## 2021-02-15 DIAGNOSIS — M545 Low back pain, unspecified: Secondary | ICD-10-CM | POA: Diagnosis not present

## 2021-02-15 DIAGNOSIS — K589 Irritable bowel syndrome without diarrhea: Secondary | ICD-10-CM | POA: Diagnosis not present

## 2021-02-15 DIAGNOSIS — N1832 Chronic kidney disease, stage 3b: Secondary | ICD-10-CM | POA: Diagnosis not present

## 2021-02-15 DIAGNOSIS — M810 Age-related osteoporosis without current pathological fracture: Secondary | ICD-10-CM | POA: Diagnosis not present

## 2021-02-15 DIAGNOSIS — I872 Venous insufficiency (chronic) (peripheral): Secondary | ICD-10-CM | POA: Diagnosis not present

## 2021-02-15 DIAGNOSIS — M1990 Unspecified osteoarthritis, unspecified site: Secondary | ICD-10-CM | POA: Diagnosis not present

## 2021-02-15 DIAGNOSIS — K219 Gastro-esophageal reflux disease without esophagitis: Secondary | ICD-10-CM | POA: Diagnosis not present

## 2021-02-20 DIAGNOSIS — M545 Low back pain, unspecified: Secondary | ICD-10-CM | POA: Diagnosis not present

## 2021-02-20 DIAGNOSIS — M810 Age-related osteoporosis without current pathological fracture: Secondary | ICD-10-CM | POA: Diagnosis not present

## 2021-02-20 DIAGNOSIS — N1832 Chronic kidney disease, stage 3b: Secondary | ICD-10-CM | POA: Diagnosis not present

## 2021-02-20 DIAGNOSIS — I872 Venous insufficiency (chronic) (peripheral): Secondary | ICD-10-CM | POA: Diagnosis not present

## 2021-02-20 DIAGNOSIS — K219 Gastro-esophageal reflux disease without esophagitis: Secondary | ICD-10-CM | POA: Diagnosis not present

## 2021-02-20 DIAGNOSIS — M1990 Unspecified osteoarthritis, unspecified site: Secondary | ICD-10-CM | POA: Diagnosis not present

## 2021-02-20 DIAGNOSIS — K589 Irritable bowel syndrome without diarrhea: Secondary | ICD-10-CM | POA: Diagnosis not present

## 2021-02-20 DIAGNOSIS — G8929 Other chronic pain: Secondary | ICD-10-CM | POA: Diagnosis not present

## 2021-02-20 DIAGNOSIS — J309 Allergic rhinitis, unspecified: Secondary | ICD-10-CM | POA: Diagnosis not present

## 2021-03-07 ENCOUNTER — Telehealth: Payer: Self-pay | Admitting: Internal Medicine

## 2021-03-07 NOTE — Telephone Encounter (Signed)
Type of form received- Accessible Collection Service Application    Form placed in- Provider mailbox   Additional instructions from the patient- Notify by phone when complete @ [phone]. Also, mail completed form to:    [facility] [address] [place], [place] [address]    Things to remember: Reminded patient that forms take 7-10 business days to complete.

## 2021-03-07 NOTE — Telephone Encounter (Signed)
Noted  

## 2021-03-21 DIAGNOSIS — G8929 Other chronic pain: Secondary | ICD-10-CM | POA: Diagnosis not present

## 2021-03-21 DIAGNOSIS — M069 Rheumatoid arthritis, unspecified: Secondary | ICD-10-CM | POA: Diagnosis not present

## 2021-03-21 DIAGNOSIS — M4800 Spinal stenosis, site unspecified: Secondary | ICD-10-CM | POA: Diagnosis not present

## 2021-03-21 DIAGNOSIS — K219 Gastro-esophageal reflux disease without esophagitis: Secondary | ICD-10-CM | POA: Diagnosis not present

## 2021-03-21 DIAGNOSIS — H409 Unspecified glaucoma: Secondary | ICD-10-CM | POA: Diagnosis not present

## 2021-03-21 DIAGNOSIS — H547 Unspecified visual loss: Secondary | ICD-10-CM | POA: Diagnosis not present

## 2021-03-21 DIAGNOSIS — M1990 Unspecified osteoarthritis, unspecified site: Secondary | ICD-10-CM | POA: Diagnosis not present

## 2021-03-21 DIAGNOSIS — K2270 Barrett's esophagus without dysplasia: Secondary | ICD-10-CM | POA: Diagnosis not present

## 2021-03-21 DIAGNOSIS — I499 Cardiac arrhythmia, unspecified: Secondary | ICD-10-CM | POA: Diagnosis not present

## 2021-04-05 ENCOUNTER — Other Ambulatory Visit: Payer: Self-pay

## 2021-04-05 ENCOUNTER — Encounter: Payer: Self-pay | Admitting: Obstetrics & Gynecology

## 2021-04-05 ENCOUNTER — Ambulatory Visit (INDEPENDENT_AMBULATORY_CARE_PROVIDER_SITE_OTHER): Payer: Medicare PPO | Admitting: Obstetrics & Gynecology

## 2021-04-05 VITALS — BP 132/80 | HR 75 | Resp 16 | Ht 59.25 in | Wt 132.0 lb

## 2021-04-05 DIAGNOSIS — Z780 Asymptomatic menopausal state: Secondary | ICD-10-CM | POA: Diagnosis not present

## 2021-04-05 DIAGNOSIS — N3946 Mixed incontinence: Secondary | ICD-10-CM | POA: Diagnosis not present

## 2021-04-05 DIAGNOSIS — M810 Age-related osteoporosis without current pathological fracture: Secondary | ICD-10-CM

## 2021-04-05 DIAGNOSIS — Z01419 Encounter for gynecological examination (general) (routine) without abnormal findings: Secondary | ICD-10-CM | POA: Diagnosis not present

## 2021-04-05 DIAGNOSIS — Z9189 Other specified personal risk factors, not elsewhere classified: Secondary | ICD-10-CM

## 2021-04-05 NOTE — Progress Notes (Signed)
Leah [name] August 16, 1929 [id number]   History:    87 y.o. G2P2L2 Widowed  RP:  Established patient presenting for annual gyn exam   HPI: Postmenopausal, well on no HRT.  No PMB.  No pelvic pain.  Pap smear Neg in 2011.  H/O Pelvic relaxation/prolapse with cystocele, rectocele, uterine prolapse, but asymptomatic, no bulging felt or seen by patient.  C/O ongoing SUI and Urge Incontinence.  Using pads, but sometimes pad moves.  Breasts normal.  Mammogram at [facility] in 2022.  Colonoscopy 2009.  Osteoporosis.  DEXA 04/2018 T score -3.2 left forearm.  She used bisphosphonates 2003 through 2011.  Started Prolia 2018 which she receives through her primary physician's office. DEXA scans are only measuring the distal third of her radius (bilateral hip replacements and severe degenerative changes of spine exclude analysis of those two sites) so with that limited screening she has decided to not continue with DEXA and will just continue on the Prolia for now.  She will continue to follow up with them in regard to bone health.  Health labs with Fam MD.    Past medical history,surgical history, family history and social history were all reviewed and documented in the EPIC chart.  Gynecologic History No LMP recorded. Patient is postmenopausal.  Obstetric History OB History  Gravida Para Term Preterm AB Living  2 2 2     2   SAB IAB Ectopic Multiple Live Births               # Outcome Date GA Lbr Len/2nd Weight Sex Delivery Anes PTL Lv  2 Term           1 Term              ROS: A ROS was performed and pertinent positives and negatives are included in the history.  GENERAL: No fevers or chills. HEENT: No change in vision, no earache, sore throat or sinus congestion. NECK: No pain or stiffness. CARDIOVASCULAR: No chest pain or pressure. No palpitations. PULMONARY: No shortness of breath, cough or wheeze. GASTROINTESTINAL: No abdominal pain, nausea, vomiting or diarrhea, melena or bright red blood  per rectum. GENITOURINARY: No urinary frequency, urgency, hesitancy or dysuria. MUSCULOSKELETAL: No joint or muscle pain, no back pain, no recent trauma. DERMATOLOGIC: No rash, no itching, no lesions. ENDOCRINE: No polyuria, polydipsia, no heat or cold intolerance. No recent change in weight. HEMATOLOGICAL: No anemia or easy bruising or bleeding. NEUROLOGIC: No headache, seizures, numbness, tingling or weakness. PSYCHIATRIC: No depression, no loss of interest in normal activity or change in sleep pattern.     Exam:   BP 132/80   Pulse 75   Resp 16   Ht 4' 11.25" (1.505 m)   Wt 132 lb (59.9 kg)   BMI 26.44 kg/m   Body mass index is 26.44 kg/m.  General appearance : Well developed well nourished female. No acute distress HEENT: Eyes: no retinal hemorrhage or exudates,  Neck supple, trachea midline, no carotid bruits, no thyroidmegaly Lungs: Clear to auscultation, no rhonchi or wheezes, or rib retractions  Heart: Regular rate and rhythm, no murmurs or gallops Breast:Examined in sitting and supine position were symmetrical in appearance, no palpable masses or tenderness,  no skin retraction, no nipple inversion, no nipple discharge, no skin discoloration, no axillary or supraclavicular lymphadenopathy Abdomen: no palpable masses or tenderness, no rebound or guarding Extremities: no edema or skin discoloration or tenderness  Pelvic: Vulva: Normal  Vagina: No gross lesions or discharge  Cervix: No gross lesions or discharge  Uterus  AV, normal size, shape and consistency, non-tender and mobile  Adnexa  Without masses or tenderness  Anus: Normal  No Cystocele, Rectocele or Uterine Prolapse on gyn exam in lithotomy position with valsalva.   Assessment/Plan:  87 y.o. female for annual exam   1. Well female exam with routine gynecological exam Normal gynecologic exam.  Pap test not indicated at this time.  Breast exam normal.  Will obtain report of Mamm[facility] at [facility] in 2022.   Health labs with Fam MD.  2. Postmenopause Well on no HRT.  No PMB.  3. Postmenopausal osteoporosis  On Prolia through her Fam MD.  No longer doing BD.  4. Mixed stress and urge urinary incontinence  Recommend Depends or Silhouette for improved patient's comfort.  Currently using pads.  Princess Bruins MD, 12:18 PM 04/05/2021

## 2021-04-08 NOTE — Telephone Encounter (Signed)
Calling to see if Leah [name] form has been completed & mailed as previously discussed  Nurse stated form was mailed over 2-3 weeks ago  Patient is saying [name] never received form.. says she will have them check again & call back

## 2021-04-15 DIAGNOSIS — Z1231 Encounter for screening mammogram for malignant neoplasm of breast: Secondary | ICD-10-CM | POA: Diagnosis not present

## 2021-05-01 DIAGNOSIS — H401132 Primary open-angle glaucoma, bilateral, moderate stage: Secondary | ICD-10-CM | POA: Diagnosis not present

## 2021-05-01 DIAGNOSIS — H26491 Other secondary cataract, right eye: Secondary | ICD-10-CM | POA: Diagnosis not present

## 2021-05-01 DIAGNOSIS — H52203 Unspecified astigmatism, bilateral: Secondary | ICD-10-CM | POA: Diagnosis not present

## 2021-05-01 DIAGNOSIS — H04123 Dry eye syndrome of bilateral lacrimal glands: Secondary | ICD-10-CM | POA: Diagnosis not present

## 2021-05-06 ENCOUNTER — Other Ambulatory Visit: Payer: Self-pay | Admitting: Internal Medicine

## 2021-05-14 ENCOUNTER — Ambulatory Visit (INDEPENDENT_AMBULATORY_CARE_PROVIDER_SITE_OTHER): Payer: Medicare PPO

## 2021-05-14 ENCOUNTER — Other Ambulatory Visit: Payer: Self-pay

## 2021-05-14 DIAGNOSIS — Z0000 Encounter for general adult medical examination without abnormal findings: Secondary | ICD-10-CM

## 2021-05-14 NOTE — Patient Instructions (Signed)
Ms. Koenen , Thank you for taking time to come for your Medicare Wellness Visit. I appreciate your ongoing commitment to your health goals. Please review the following plan we discussed and let me know if I can assist you in the future.   Screening recommendations/referrals: Colonoscopy: Not a candidate for Colon screening due to age 87: 04/2021; recheck in 06/2021 Bone Density: 04/05/2018; stable, patient on Prolia Recommended yearly ophthalmology/optometry visit for glaucoma screening and checkup Recommended yearly dental visit for hygiene and checkup  Vaccinations: Influenza vaccine: 02/01/2021 Pneumococcal vaccine: 09/08/2013, 03/09/2019 Tdap vaccine: 09/24/2011; due every 10 years Shingles vaccine: never done   Covid-19: 06/21/2019, 07/11/2019, 03/18/2020, 04/08/2021  Advanced directives: Please bring a copy of your health care power of attorney and living will to the office at your convenience.  Conditions/risks identified: Yes; continue to be independent and physically active.  Next appointment: Please schedule your next Medicare Wellness Visit with your Nurse Health Advisor in 1 year by calling [phone].   Preventive Care 85 Years and Older, Female Preventive care refers to lifestyle choices and visits with your health care provider that can promote health and wellness. What does preventive care include? A yearly physical exam. This is also called an annual well check. Dental exams once or twice a year. Routine eye exams. Ask your health care provider how often you should have your eyes checked. Personal lifestyle choices, including: Daily care of your teeth and gums. Regular physical activity. Eating a healthy diet. Avoiding tobacco and drug use. Limiting alcohol use. Practicing safe sex. Taking low-dose aspirin every day. Taking vitamin and mineral supplements as recommended by your health care provider. What happens during an annual well check? The services and  screenings done by your health care provider during your annual well check will depend on your age, overall health, lifestyle risk factors, and family history of disease. Counseling  Your health care provider may ask you questions about your: Alcohol use. Tobacco use. Drug use. Emotional well-being. Home and relationship well-being. Sexual activity. Eating habits. History of falls. Memory and ability to understand (cognition). Work and work Statistician. Reproductive health. Screening  You may have the following tests or measurements: Height, weight, and BMI. Blood pressure. Lipid and cholesterol levels. These may be checked every 5 years, or more frequently if you are over 60 years old. Skin check. Lung cancer screening. You may have this screening every year starting at age 3 if you have a 30-pack-year history of smoking and currently smoke or have quit within the past 15 years. Fecal occult blood test (FOBT) of the stool. You may have this test every year starting at age 73. Flexible sigmoidoscopy or colonoscopy. You may have a sigmoidoscopy every 5 years or a colonoscopy every 10 years starting at age 48. Hepatitis C blood test. Hepatitis B blood test. Sexually transmitted disease (STD) testing. Diabetes screening. This is done by checking your blood sugar (glucose) after you have not eaten for a while (fasting). You may have this done every 1-3 years. Bone density scan. This is done to screen for osteoporosis. You may have this done starting at age 70. Mammogram. This may be done every 1-2 years. Talk to your health care provider about how often you should have regular mammograms. Talk with your health care provider about your test results, treatment options, and if necessary, the need for more tests. Vaccines  Your health care provider may recommend certain vaccines, such as: Influenza vaccine. This is recommended every year. Tetanus, diphtheria, and  acellular pertussis (Tdap,  Td) vaccine. You may need a Td booster every 10 years. Zoster vaccine. You may need this after age 75. Pneumococcal 13-valent conjugate (PCV13) vaccine. One dose is recommended after age 92. Pneumococcal polysaccharide (PPSV23) vaccine. One dose is recommended after age 22. Talk to your health care provider about which screenings and vaccines you need and how often you need them. This information is not intended to replace advice given to you by your health care provider. Make sure you discuss any questions you have with your health care provider. Document Released: 06/15/2015 Document Revised: 02/06/2016 Document Reviewed: 03/20/2015 Elsevier Interactive Patient Education  2017 [place] Prevention in the Home Falls can cause injuries. They can happen to people of all ages. There are many things you can do to make your home safe and to help prevent falls. What can I do on the outside of my home? Regularly fix the edges of walkways and driveways and fix any cracks. Remove anything that might make you trip as you walk through a door, such as a raised step or threshold. Trim any bushes or trees on the path to your home. Use bright outdoor lighting. Clear any walking paths of anything that might make someone trip, such as rocks or tools. Regularly check to see if handrails are loose or broken. Make sure that both sides of any steps have handrails. Any raised decks and porches should have guardrails on the edges. Have any leaves, snow, or ice cleared regularly. Use sand or salt on walking paths during winter. Clean up any spills in your garage right away. This includes oil or grease spills. What can I do in the bathroom? Use night lights. Install grab bars by the toilet and in the tub and shower. Do not use towel bars as grab bars. Use non-skid mats or decals in the tub or shower. If you need to sit down in the shower, use a plastic, non-slip stool. Keep the floor dry. Clean up any  water that spills on the floor as soon as it happens. Remove soap buildup in the tub or shower regularly. Attach bath mats securely with double-sided non-slip rug tape. Do not have throw rugs and other things on the floor that can make you trip. What can I do in the bedroom? Use night lights. Make sure that you have a light by your bed that is easy to reach. Do not use any sheets or blankets that are too big for your bed. They should not hang down onto the floor. Have a firm chair that has side arms. You can use this for support while you get dressed. Do not have throw rugs and other things on the floor that can make you trip. What can I do in the kitchen? Clean up any spills right away. Avoid walking on wet floors. Keep items that you use a lot in easy-to-reach places. If you need to reach something above you, use a strong step stool that has a grab bar. Keep electrical cords out of the way. Do not use floor polish or wax that makes floors slippery. If you must use wax, use non-skid floor wax. Do not have throw rugs and other things on the floor that can make you trip. What can I do with my stairs? Do not leave any items on the stairs. Make sure that there are handrails on both sides of the stairs and use them. Fix handrails that are broken or loose. Make sure that  handrails are as long as the stairways. Check any carpeting to make sure that it is firmly attached to the stairs. Fix any carpet that is loose or worn. Avoid having throw rugs at the top or bottom of the stairs. If you do have throw rugs, attach them to the floor with carpet tape. Make sure that you have a light switch at the top of the stairs and the bottom of the stairs. If you do not have them, ask someone to add them for you. What else can I do to help prevent falls? Wear shoes that: Do not have high heels. Have rubber bottoms. Are comfortable and fit you well. Are closed at the toe. Do not wear sandals. If you use a  stepladder: Make sure that it is fully opened. Do not climb a closed stepladder. Make sure that both sides of the stepladder are locked into place. Ask someone to hold it for you, if possible. Clearly mark and make sure that you can see: Any grab bars or handrails. First and last steps. Where the edge of each step is. Use tools that help you move around (mobility aids) if they are needed. These include: Canes. Walkers. Scooters. Crutches. Turn on the lights when you go into a dark area. Replace any light bulbs as soon as they burn out. Set up your furniture so you have a clear path. Avoid moving your furniture around. If any of your floors are uneven, fix them. If there are any pets around you, be aware of where they are. Review your medicines with your doctor. Some medicines can make you feel dizzy. This can increase your chance of falling. Ask your doctor what other things that you can do to help prevent falls. This information is not intended to replace advice given to you by your health care provider. Make sure you discuss any questions you have with your health care provider. Document Released: 03/15/2009 Document Revised: 10/25/2015 Document Reviewed: 06/23/2014 Elsevier Interactive Patient Education  2017 [employer].

## 2021-05-14 NOTE — Progress Notes (Signed)
I connected with Leah [name] today by telephone and verified that I am speaking with the correct person using two identifiers. Location patient: home Location provider: work Persons participating in the virtual visit: patient, provider.   I discussed the limitations, risks, security and privacy concerns of performing an evaluation and management service by telephone and the availability of in person appointments. I also discussed with the patient that there may be a patient responsible charge related to this service. The patient expressed understanding and verbally consented to this telephonic visit.    Interactive audio and video telecommunications were attempted between this provider and patient, however failed, due to patient having technical difficulties OR patient did not have access to video capability.  We continued and completed visit with audio only.  Some vital signs may be absent or patient reported.   Time Spent with patient on telephone encounter: 40 minutes  Subjective:   Leah [name] is a 87 y.o. female who presents for Medicare Annual (Subsequent) preventive examination.  Review of Systems     Cardiac Risk Factors include: advanced age (>63men, >26 women);dyslipidemia;family history of premature cardiovascular disease     Objective:    There were no vitals filed for this visit. There is no height or weight on file to calculate BMI.  Advanced Directives 05/14/2021 06/07/2020 03/27/2020 03/08/2019 02/04/2018 11/01/2015 10/13/2011  Does Patient Have a Medical Advance Directive? Yes Yes Yes Yes No Yes Patient has advance directive, copy not in chart  Type of Advance Directive Living will;Healthcare Power of [place] of [place];Living will [place];Living will - [place];Living will [place];Living will  Does patient want to make changes to medical advance directive?  No - Patient declined No - Patient declined No - Patient declined - - No - Patient declined -  Copy of [place] in Chart? No - copy requested No - copy requested No - copy requested No - copy requested - Yes Copy requested from other (Comment)  Would patient like information on creating a medical advance directive? - - - - No - Patient declined - -  Pre-existing out of facility DNR order (yellow form or pink MOST form) - - - - - - No    Current Medications (verified) Outpatient Encounter Medications as of 05/14/2021  Medication Sig   acetaminophen (TYLENOL) 650 MG CR tablet Take 1,300 mg by mouth every 8 (eight) hours as needed for pain.   Apoaequorin (PREVAGEN) 10 MG CAPS Take 20 mg by mouth daily.   Ascorbic Acid (VITAMIN C) 1000 MG tablet Take 1,000 mg by mouth daily.   aspirin EC 81 MG tablet Take 81 mg by mouth daily. Swallow whole.   CALCIUM-MAGNESIUM-ZINC PO Take 3 tablets by mouth daily.   Cholecalciferol (DIALYVITE VITAMIN D 5000) 125 MCG (5000 UT) capsule Take 5,000 Units by mouth daily.   denosumab (PROLIA) 60 MG/ML SOLN injection Inject 60 mg into the skin every 6 (six) months. Administer in upper arm, thigh, or abdomen   latanoprost (XALATAN) 0.005 % ophthalmic solution Place 1 drop into both eyes at bedtime.   pantoprazole (PROTONIX) 40 MG tablet Take 1 tablet (40 mg total) by mouth 2 (two) times daily.   vitamin E 400 UNIT capsule Take 400 Units by mouth daily.   diclofenac (VOLTAREN) 75 MG EC tablet TAKE ONE TABLET BY MOUTH TWICE DAILY (Patient not taking: Reported on 05/14/2021)   furosemide (LASIX) 20 MG  tablet Take 1 tablet (20 mg total) by mouth 2 (two) times daily. (Patient not taking: Reported on 05/14/2021)   No facility-administered encounter medications on file as of 05/14/2021.    Allergies (verified) Sulfa antibiotics, Sulfasalazine, Nabumetone, Naproxen sodium, and Tramadol hcl   History: Past Medical History:  Diagnosis Date   Acute  maxillary sinusitis    Allergy    Bundle branch block, unspecified    EKG 4/13 with clearance and note that isnt new block Dr Arnoldo Morale on chart   Carpal tunnel syndrome    Cystocele    Detrusor instability    GERD (gastroesophageal reflux disease)    Glaucoma    Hyperlipidemia    Localized osteoarthrosis not specified whether primary or secondary, lower leg    Osteoporosis 01/2014   T score -2.5 UD forarm, stable at the spine recommend 2 year follow up DEXA   PONV (postoperative nausea and vomiting)    19 yrs ago- states has had general anesthesia since and did well   Unspecified adverse effect of unspecified drug, medicinal and biological substance    Uterine prolapse    Past Surgical History:  Procedure Laterality Date   arthroscopic r knee     CARPAL TUNNEL RELEASE  2011   COLONOSCOPY     DILATION AND CURETTAGE OF UTERUS  1982   EYE SURGERY     bilateral cataract extraction  with IOL   FLEXIBLE SIGMOIDOSCOPY     HYSTEROSCOPY     HYSTEROSCOPY WITH D & C  2005 and 2008   IR SACROPLASTY BILATERAL  06/07/2020   JOINT REPLACEMENT     bilateral total hip arthroplasty   total hip rerplacement bilateral     TOTAL KNEE ARTHROPLASTY  10/13/2011   Procedure: TOTAL KNEE ARTHROPLASTY;  Surgeon: Gearlean Alf, MD;  Location: [facility];  Service: Orthopedics;  Laterality: Right;   UPPER GI ENDOSCOPY  07/2018 and 3/20   Family History  Problem Relation Age of Onset   Dementia Mother    Breast cancer Mother 43   Heart disease Father    Stroke Father    Dementia Sister    Colon cancer Neg Hx    Stomach cancer Neg Hx    Rectal cancer Neg Hx    Esophageal cancer Neg Hx    Colon polyps Neg Hx    Pancreatic cancer Neg Hx    Social History   Socioeconomic History   Marital status: Widowed    Spouse name: Not on file   Number of children: 2   Years of education: Not on file   Highest education level: Not on file  Occupational History   Occupation: retired  Tobacco Use   Smoking  status: Former    Types: Cigarettes    Quit date: 10/06/1958    Years since quitting: 62.6   Smokeless tobacco: Never  Vaping Use   Vaping Use: Never used  Substance and Sexual Activity   Alcohol use: Not Currently   Drug use: No   Sexual activity: Not Currently    Birth control/protection: Post-menopausal    Comment: 1st intercourse 62 yo-1 partner  Other Topics Concern   Not on file  Social History Narrative   Not on file   Social Determinants of Health   Financial Resource Strain: Low Risk    Difficulty of Paying Living Expenses: Not hard at all  Food Insecurity: No Food Insecurity   Worried About [place] in the Last Year: Never true  Ran Out of Food in the Last Year: Never true  Transportation Needs: No Transportation Needs   Lack of Transportation (Medical): No   Lack of Transportation (Non-Medical): No  Physical Activity: Sufficiently Active   Days of Exercise per Week: 5 days   Minutes of Exercise per Session: 30 min  Stress: No Stress Concern Present   Feeling of Stress : Not at all  Social Connections: Moderately Integrated   Frequency of Communication with Friends and Family: More than three times a week   Frequency of Social Gatherings with Friends and Family: More than three times a week   Attends Religious Services: More than 4 times per year   Active Member of [employer] or Organizations: Yes   Attends Archivist Meetings: More than 4 times per year   Marital Status: Widowed    Tobacco Counseling Counseling given: Not Answered   Clinical Intake:     Pain : No/denies pain     Nutritional Risks: None Diabetes: No  How often do you need to have someone help you when you read instructions, pamphlets, or other written materials from your doctor or pharmacy?: 1 - Never What is the last grade level you completed in school?: 1 year of college  Diabetic? no  Interpreter Needed?: No  Information entered by :: Lisette Abu,  LPN   Activities of Daily Living In your present state of health, do you have any difficulty performing the following activities: 05/14/2021 06/07/2020  Hearing? N N  Vision? N N  Difficulty concentrating or making decisions? N N  Walking or climbing stairs? N N  Comment uses a cane for assistance -  Dressing or bathing? N N  Doing errands, shopping? N -  Preparing Food and eating ? N -  Using the Toilet? N -  In the past six months, have you accidently leaked urine? Y -  Comment wears protection -  Do you have problems with loss of bowel control? N -  Managing your Medications? N -  Managing your Finances? N -  Housekeeping or managing your Housekeeping? N -  Some recent data might be hidden    Patient Care Team: Hoyt Koch, MD as PCP - General (Internal Medicine) Fontaine, Belinda Block, MD (Inactive) as Consulting Physician (Gynecology) Marygrace Drought, MD as Consulting Physician (Ophthalmology) Ladene Artist, MD as Consulting Physician (Gastroenterology)  Indicate any recent Medical Services you may have received from other than [facility] providers in the past year (date may be approximate).     Assessment:   This is a routine wellness examination for Leah [name].  Hearing/Vision screen Hearing Screening - Comments:: Patient denied any hearing difficulty. No hearing aids. Vision Screening - Comments:: Patient wears eyeglasses.  Annual eye exam done by: Dr. Marygrace Drought.  Dietary issues and exercise activities discussed: Current Exercise Habits: Home exercise routine, Type of exercise: walking;Other - see comments (PT exercises, stair climber, [place]), Time (Minutes): 30, Frequency (Times/Week): 5, Weekly Exercise (Minutes/Week): 150, Intensity: Mild, Exercise limited by: orthopedic condition(s);cardiac condition(s)   Goals Addressed   None   Depression Screen PHQ 2/9 Scores 05/14/2021 03/27/2020 03/08/2019 07/16/2018 05/21/2017 05/21/2017 11/01/2015  PHQ - 2 Score 0 0  1 0 0 0 0    Fall Risk Fall Risk  05/14/2021 07/02/2020 03/28/2020 03/27/2020 03/08/2019  Falls in the past year? 0 1 1 1  0  Number falls in past yr: 0 0 0 0 0  Injury with Fall? 0 1 0 0 0  Risk for  fall due to : Impaired balance/gait - Impaired mobility Impaired balance/gait -  Risk for fall due to: Comment uses a cane - - - -  Follow up Falls prevention discussed - Falls evaluation completed Falls evaluation completed Falls prevention discussed    FALL RISK PREVENTION PERTAINING TO THE HOME:  Any stairs in or around the home? Yes  If so, are there any without handrails? No  Home free of loose throw rugs in walkways, pet beds, electrical cords, etc? Yes  Adequate lighting in your home to reduce risk of falls? Yes   ASSISTIVE DEVICES UTILIZED TO PREVENT FALLS:  Life alert? No  (Information mailed to patient) Use of a cane, walker or w/c? Yes  Grab bars in the bathroom? Yes  Shower chair or bench in shower? No  Elevated toilet seat or a handicapped toilet? No   TIMED UP AND GO:  Was the test performed? No .  Length of time to ambulate 10 feet: n/a sec.   Gait slow and steady with assistive device (per patient)  Cognitive Function: Normal cognitive status assessed by direct observation by this Nurse Health Advisor. No abnormalities found.   MMSE - Mini Mental State Exam 03/08/2019  Not completed: Refused        Immunizations Immunization History  Administered Date(s) Administered   Fluad Quad(high Dose 65+) 02/22/2019, 02/01/2021   Influenza Split 03/13/2011   Influenza Whole 06/02/2004, 04/02/2007, 02/24/2008, 03/30/2009, 02/14/2010   Influenza, High Dose Seasonal PF 03/06/2014, 02/03/2018   Influenza,inj,Quad PF,6+ Mos 03/12/2017   Influenza-Unspecified 03/11/2013, 03/03/2015, 03/18/2020   PFIZER(Purple Top)SARS-COV-2 Vaccination 06/21/2019, 07/11/2019, 03/18/2020   Pfizer Covid-19 Vaccine Bivalent Booster 78yrs & up 04/08/2021   Pneumococcal Conjugate-13 03/09/2019    Pneumococcal Polysaccharide-23 06/02/2005, 09/08/2013   Td 06/02/2001, 09/24/2011    TDAP status: Up to date  Flu Vaccine status: Up to date  Pneumococcal vaccine status: Up to date  Covid-19 vaccine status: Completed vaccines  Qualifies for Shingles Vaccine? Yes   Zostavax completed No   Shingrix Completed?: No.    Education has been provided regarding the importance of this vaccine. Patient has been advised to call insurance company to determine out of pocket expense if they have not yet received this vaccine. Advised may also receive vaccine at local pharmacy or Health Dept. Verbalized acceptance and understanding.  Screening Tests Health Maintenance  Topic Date Due   Zoster Vaccines- Shingrix (1 of 2) Never done   TETANUS/TDAP  09/23/2021   Pneumonia Vaccine 48+ Years old  Completed   INFLUENZA VACCINE  Completed   DEXA SCAN  Completed   COVID-19 Vaccine  Completed   HPV VACCINES  Aged Out    Health Maintenance  Health Maintenance Due  Topic Date Due   Zoster Vaccines- Shingrix (1 of 2) Never done    Colorectal cancer screening: No longer required.   Mammogram status: Completed 04/2021. Repeat every year (per patient)  Bone Density status: Completed 04/05/2018. Results reflect: Bone density results: NORMAL. Repeat every 0 years. (Stable results; no longer recommended)  Lung Cancer Screening: (Low Dose CT Chest recommended if Age 5-80 years, 30 pack-year currently smoking OR have quit w/in 15years.) does not qualify.   Lung Cancer Screening Referral: no  Additional Screening:  Hepatitis C Screening: does not qualify; Completed no  Vision Screening: Recommended annual ophthalmology exams for early detection of glaucoma and other disorders of the eye. Is the patient up to date with their annual eye exam?  Yes  Who is the provider  or what is the name of the office in which the patient attends annual eye exams? Dr. Marygrace Drought If pt is not established with a  provider, would they like to be referred to a provider to establish care? No .   Dental Screening: Recommended annual dental exams for proper oral hygiene  Community Resource Referral / Chronic Care Management: CRR required this visit?  No   CCM required this visit?  No      Plan:     I have personally reviewed and noted the following in the patients chart:   Medical and social history Use of alcohol, tobacco or illicit drugs  Current medications and supplements including opioid prescriptions.  Functional ability and status Nutritional status Physical activity Advanced directives List of other physicians Hospitalizations, surgeries, and ER visits in previous 12 months Vitals Screenings to include cognitive, depression, and falls Referrals and appointments  In addition, I have reviewed and discussed with patient certain preventive protocols, quality metrics, and best practice recommendations. A written personalized care plan for preventive services as well as general preventive health recommendations were provided to patient.     Sheral Flow, LPN   [id number]   Nurse Notes:  Patient is cogitatively intact. There were no vitals filed for this visit. There is no height or weight on file to calculate BMI.

## 2021-06-04 ENCOUNTER — Encounter: Payer: Self-pay | Admitting: Internal Medicine

## 2021-06-04 DIAGNOSIS — D225 Melanocytic nevi of trunk: Secondary | ICD-10-CM | POA: Diagnosis not present

## 2021-06-04 DIAGNOSIS — D1801 Hemangioma of skin and subcutaneous tissue: Secondary | ICD-10-CM | POA: Diagnosis not present

## 2021-06-04 DIAGNOSIS — L814 Other melanin hyperpigmentation: Secondary | ICD-10-CM | POA: Diagnosis not present

## 2021-06-04 DIAGNOSIS — L570 Actinic keratosis: Secondary | ICD-10-CM | POA: Diagnosis not present

## 2021-06-04 DIAGNOSIS — C44311 Basal cell carcinoma of skin of nose: Secondary | ICD-10-CM | POA: Diagnosis not present

## 2021-06-04 DIAGNOSIS — D485 Neoplasm of uncertain behavior of skin: Secondary | ICD-10-CM | POA: Diagnosis not present

## 2021-06-04 DIAGNOSIS — Z85828 Personal history of other malignant neoplasm of skin: Secondary | ICD-10-CM | POA: Diagnosis not present

## 2021-06-04 DIAGNOSIS — L565 Disseminated superficial actinic porokeratosis (DSAP): Secondary | ICD-10-CM | POA: Diagnosis not present

## 2021-06-06 ENCOUNTER — Encounter: Payer: Self-pay | Admitting: Obstetrics & Gynecology

## 2021-06-11 ENCOUNTER — Ambulatory Visit: Payer: Medicare PPO | Admitting: Internal Medicine

## 2021-06-11 ENCOUNTER — Encounter: Payer: Self-pay | Admitting: Internal Medicine

## 2021-06-11 ENCOUNTER — Other Ambulatory Visit: Payer: Self-pay

## 2021-06-11 VITALS — BP 124/78 | HR 76 | Resp 18 | Ht 59.25 in | Wt 132.6 lb

## 2021-06-11 DIAGNOSIS — R739 Hyperglycemia, unspecified: Secondary | ICD-10-CM | POA: Diagnosis not present

## 2021-06-11 DIAGNOSIS — N1832 Chronic kidney disease, stage 3b: Secondary | ICD-10-CM

## 2021-06-11 DIAGNOSIS — G8929 Other chronic pain: Secondary | ICD-10-CM | POA: Diagnosis not present

## 2021-06-11 DIAGNOSIS — M159 Polyosteoarthritis, unspecified: Secondary | ICD-10-CM | POA: Diagnosis not present

## 2021-06-11 DIAGNOSIS — M5442 Lumbago with sciatica, left side: Secondary | ICD-10-CM

## 2021-06-11 DIAGNOSIS — M5441 Lumbago with sciatica, right side: Secondary | ICD-10-CM

## 2021-06-11 DIAGNOSIS — E785 Hyperlipidemia, unspecified: Secondary | ICD-10-CM | POA: Diagnosis not present

## 2021-06-11 LAB — COMPREHENSIVE METABOLIC PANEL
ALT: 22 U/L (ref 0–35)
AST: 22 U/L (ref 0–37)
Albumin: 4 g/dL (ref 3.5–5.2)
Alkaline Phosphatase: 63 U/L (ref 39–117)
BUN: 25 mg/dL — ABNORMAL HIGH (ref 6–23)
CO2: 31 mEq/L (ref 19–32)
Calcium: 9.8 mg/dL (ref 8.4–10.5)
Chloride: 102 mEq/L (ref 96–112)
Creatinine, Ser: 1.01 mg/dL (ref 0.40–1.20)
GFR: 49.52 mL/min — ABNORMAL LOW (ref >60.00)
Glucose, Bld: 60 mg/dL — ABNORMAL LOW (ref 70–99)
Potassium: 4.1 mEq/L (ref 3.5–5.1)
Sodium: 139 mEq/L (ref 135–145)
Total Bilirubin: 0.7 mg/dL (ref 0.2–1.2)
Total Protein: 6.9 g/dL (ref 6.0–8.3)

## 2021-06-11 LAB — CBC
HCT: 39.3 % (ref 36.0–46.0)
Hemoglobin: 12.7 g/dL (ref 12.0–15.0)
MCHC: 32.2 g/dL (ref 30.0–36.0)
MCV: 89.1 fl (ref 78.0–100.0)
Platelets: 240 10*3/uL (ref 150.0–400.0)
RBC: 4.42 Mil/uL (ref 3.87–5.11)
RDW: 15.1 % (ref 11.5–15.5)
WBC: 7.6 10*3/uL (ref 4.0–10.5)

## 2021-06-11 LAB — LIPID PANEL
Cholesterol: 193 mg/dL (ref 0–200)
HDL: 59.3 mg/dL (ref >39.00)
LDL Cholesterol: 105 mg/dL — ABNORMAL HIGH (ref 0–99)
NonHDL: 133.95
Total CHOL/HDL Ratio: 3
Triglycerides: 143 mg/dL (ref 0.0–149.0)
VLDL: 28.6 mg/dL (ref 0.0–40.0)

## 2021-06-11 LAB — HEMOGLOBIN A1C: Hgb A1c MFr Bld: 5.7 % (ref 4.6–6.5)

## 2021-06-11 NOTE — Progress Notes (Signed)
° °  Subjective:   Patient ID: Leah [name], female    DOB: 1929-11-27, 88 y.o.   MRN: [id number]  HPI The patient is an 88 YO female coming in for follow up.   Review of Systems  Constitutional:  Positive for fatigue.  HENT: Negative.    Eyes: Negative.   Respiratory:  Negative for cough, chest tightness and shortness of breath.   Cardiovascular:  Negative for chest pain, palpitations and leg swelling.  Gastrointestinal:  Negative for abdominal distention, abdominal pain, constipation, diarrhea, nausea and vomiting.  Musculoskeletal:  Positive for arthralgias and back pain.  Skin: Negative.   Neurological:  Positive for weakness.  Psychiatric/Behavioral: Negative.     Objective:  Physical Exam Constitutional:      Appearance: She is well-developed.  HENT:     Head: Normocephalic and atraumatic.  Cardiovascular:     Rate and Rhythm: Normal rate and regular rhythm.  Pulmonary:     Effort: Pulmonary effort is normal. No respiratory distress.     Breath sounds: Normal breath sounds. No wheezing or rales.  Abdominal:     General: Bowel sounds are normal. There is no distension.     Palpations: Abdomen is soft.     Tenderness: There is no abdominal tenderness. There is no rebound.  Musculoskeletal:     Cervical back: Normal range of motion.  Skin:    General: Skin is warm and dry.  Neurological:     Mental Status: She is alert and oriented to person, place, and time.     Coordination: Coordination abnormal.    Vitals:   06/11/21 1301  BP: 124/78  Pulse: 76  Resp: 18  SpO2: 96%  Weight: 132 lb 9.6 oz (60.1 kg)  Height: 4' 11.25" (1.505 m)    This visit occurred during the SARS-CoV-2 public health emergency.  Safety protocols were in place, including screening questions prior to the visit, additional usage of staff PPE, and extensive cleaning of exam room while observing appropriate contact time as indicated for disinfecting solutions.   Assessment & Plan:

## 2021-06-11 NOTE — Patient Instructions (Signed)
We will check the labs today and get physical therapy back to the house.

## 2021-06-14 ENCOUNTER — Encounter: Payer: Self-pay | Admitting: Internal Medicine

## 2021-06-14 NOTE — Assessment & Plan Note (Signed)
Needs home health PT to work on strength and balance.

## 2021-06-14 NOTE — Assessment & Plan Note (Signed)
Checking lipid panel and adjust as needed. Not on medications.

## 2021-06-14 NOTE — Assessment & Plan Note (Signed)
Needs home health PT to help with mobility and reduce risk of falls. Ordered today. Pain has worsened in the last few weeks.

## 2021-06-14 NOTE — Assessment & Plan Note (Signed)
Checking CMP for stability. BP at goal.

## 2021-06-18 ENCOUNTER — Telehealth: Payer: Self-pay | Admitting: Internal Medicine

## 2021-06-18 NOTE — Telephone Encounter (Signed)
Leah [name] from [facility] is requesting verbal orders to keep doing PT with the patient 2x 4 week ALSO needs verbal order to continue balance and strengthen and endurance and gait training

## 2021-06-19 NOTE — Telephone Encounter (Signed)
Okay for verbals °

## 2021-06-19 NOTE — Telephone Encounter (Signed)
Verbal orders given to [facility].

## 2021-06-19 NOTE — Telephone Encounter (Signed)
See below

## 2021-07-11 ENCOUNTER — Telehealth: Payer: Self-pay | Admitting: Internal Medicine

## 2021-07-11 NOTE — Telephone Encounter (Signed)
Josph Macho with [facility] calls today in regards to extending PT's, physical therapy orders. Orders (starting week of 07/29/2021) consist of:  Once a week for three weeks  CB: [phone] And the voicemail is secure!

## 2021-07-11 NOTE — Telephone Encounter (Signed)
See below

## 2021-07-12 NOTE — Telephone Encounter (Signed)
Noted  

## 2021-07-12 NOTE — Telephone Encounter (Signed)
Pt was seen today by Shanon Brow and has a 99.8 fever, feeling sick all over. Did a COVID test and was positive, wanted to make provider aware. Told David update on verbals.

## 2021-07-12 NOTE — Telephone Encounter (Signed)
Fine for verbals °

## 2021-07-15 ENCOUNTER — Other Ambulatory Visit: Payer: Self-pay

## 2021-07-15 ENCOUNTER — Telehealth (INDEPENDENT_AMBULATORY_CARE_PROVIDER_SITE_OTHER): Payer: Medicare PPO | Admitting: Internal Medicine

## 2021-07-15 ENCOUNTER — Encounter: Payer: Self-pay | Admitting: Internal Medicine

## 2021-07-15 DIAGNOSIS — U071 COVID-19: Secondary | ICD-10-CM | POA: Diagnosis not present

## 2021-07-15 MED ORDER — MOLNUPIRAVIR EUA 200MG CAPSULE: 4.0000 | ORAL_CAPSULE | Freq: Two times a day (BID) | ORAL | 0 refills | Status: AC

## 2021-07-15 MED ORDER — MOLNUPIRAVIR EUA 200MG CAPSULE: 4.0000 | ORAL_CAPSULE | Freq: Two times a day (BID) | ORAL | 0 refills | Status: DC

## 2021-07-15 NOTE — Progress Notes (Signed)
Virtual Visit via Audio Note  I connected with Leah [name] on 07/15/21 at  2:40 PM EST by an audio-only enabled telemedicine application and verified that I am speaking with the correct person using two identifiers.  The patient and the provider were at separate locations throughout the entire encounter. Patient location: home, Provider location: work   I discussed the limitations of evaluation and management by telemedicine and the availability of in person appointments. The patient expressed understanding and agreed to proceed. The patient and the provider were the only parties present for the visit unless noted in HPI below.  History of Present Illness: The patient is a 88 y.o. female with visit for covid-19. Tested positive 07/12/21  Observations/Objective: A and O times 3  Assessment and Plan: See problem oriented charting  Follow Up Instructions: molnupiravir rx today advised on otc cold medicine  Visit time 11 minutes in non-face to face communication with patient and coordination of care.  I discussed the assessment and treatment plan with the patient. The patient was provided an opportunity to ask questions and all were answered. The patient agreed with the plan and demonstrated an understanding of the instructions.   The patient was advised to call back or seek an in-person evaluation if the symptoms worsen or if the condition fails to improve as anticipated.  Hoyt Koch, MD

## 2021-07-15 NOTE — Assessment & Plan Note (Signed)
Rx molnupiraivr and advised of CDC quarantine guidelines.

## 2021-09-10 ENCOUNTER — Ambulatory Visit: Payer: Medicare PPO | Admitting: Internal Medicine

## 2021-09-10 ENCOUNTER — Encounter: Payer: Self-pay | Admitting: Internal Medicine

## 2021-09-10 VITALS — BP 126/70 | HR 72 | Resp 18 | Ht 59.25 in | Wt 133.2 lb

## 2021-09-10 DIAGNOSIS — R5383 Other fatigue: Secondary | ICD-10-CM | POA: Diagnosis not present

## 2021-09-10 DIAGNOSIS — N3946 Mixed incontinence: Secondary | ICD-10-CM

## 2021-09-10 DIAGNOSIS — I440 Atrioventricular block, first degree: Secondary | ICD-10-CM

## 2021-09-10 DIAGNOSIS — R030 Elevated blood-pressure reading, without diagnosis of hypertension: Secondary | ICD-10-CM

## 2021-09-10 DIAGNOSIS — I447 Left bundle-branch block, unspecified: Secondary | ICD-10-CM | POA: Diagnosis not present

## 2021-09-10 MED ORDER — MIRABEGRON ER 50 MG PO TB24: 50.0000 mg | ORAL_TABLET | Freq: Every day | ORAL | 1 refills | Status: DC

## 2021-09-10 NOTE — Progress Notes (Signed)
? ?  Subjective:  ? ?Patient ID: Leah [name], female    DOB: [birth year], 88 y.o.   MRN: [id number] ? ?HPI ?The patient is an 88 YO female coming in for new SOB on exertion. Otherwise fairly stable, worsening urinary incontinence. ? ?Review of Systems  ?Constitutional:  Positive for activity change, appetite change and fatigue.  ?HENT: Negative.    ?Eyes: Negative.   ?Respiratory:  Positive for shortness of breath. Negative for cough and chest tightness.   ?Cardiovascular:  Negative for chest pain, palpitations and leg swelling.  ?Gastrointestinal:  Negative for abdominal distention, abdominal pain, constipation, diarrhea, nausea and vomiting.  ?Genitourinary:  Positive for frequency and urgency.  ?Musculoskeletal:  Positive for arthralgias, gait problem and myalgias. Negative for joint swelling.  ?Skin: Negative.   ?Psychiatric/Behavioral: Negative.    ? ?Objective:  ?Physical Exam ?Constitutional:   ?   Appearance: She is well-developed.  ?HENT:  ?   Head: Normocephalic and atraumatic.  ?Cardiovascular:  ?   Rate and Rhythm: Normal rate and regular rhythm.  ?Pulmonary:  ?   Effort: Pulmonary effort is normal. No respiratory distress.  ?   Breath sounds: Normal breath sounds. No wheezing or rales.  ?Abdominal:  ?   General: Bowel sounds are normal. There is no distension.  ?   Palpations: Abdomen is soft.  ?   Tenderness: There is no abdominal tenderness. There is no rebound.  ?Musculoskeletal:     ?   General: Tenderness present.  ?   Cervical back: Normal range of motion.  ?Skin: ?   General: Skin is warm and dry.  ?Neurological:  ?   Mental Status: She is alert and oriented to person, place, and time.  ?   Coordination: Coordination abnormal.  ?   Comments: Cane for balance and slow gait  ? ? ?Vitals:  ? 09/10/21 1045  ?BP: 126/70  ?Pulse: 72  ?Resp: 18  ?SpO2: 97%  ?Weight: 133 lb 3.2 oz (60.4 kg)  ?Height: 4' 11.25" (1.505 m)  ? ?EKG: Rate 63, axis normal, interval PR long '@216'$ , sinus 1st degree AV block,  LBBB no other st or t wave changes, no significant change compared to prior 2019 ? ?This visit occurred during the SARS-CoV-2 public health emergency.  Safety protocols were in place, including screening questions prior to the visit, additional usage of staff PPE, and extensive cleaning of exam room while observing appropriate contact time as indicated for disinfecting solutions.  ? ?Assessment & Plan:  ? ?

## 2021-09-10 NOTE — Patient Instructions (Addendum)
We will do the EKG today which is the same as the last one. ? ?We have sent in myrbetriq to take 1 pill daily to help the bladder. ? ? ?

## 2021-09-11 DIAGNOSIS — R32 Unspecified urinary incontinence: Secondary | ICD-10-CM | POA: Insufficient documentation

## 2021-09-11 DIAGNOSIS — I440 Atrioventricular block, first degree: Secondary | ICD-10-CM | POA: Insufficient documentation

## 2021-09-11 NOTE — Assessment & Plan Note (Signed)
BP normal today but given new SOB on exertion EKG done. This is unchanged since 2019 with stable LBBB and 1st degree AV block. Continue with monitoring BP with every visit would not start medication at this time.  ?

## 2021-09-11 NOTE — Assessment & Plan Note (Signed)
Present on many EKG for some years. EKG was done today due to some SOB on exertion. This was unchanged.  ?

## 2021-09-11 NOTE — Assessment & Plan Note (Signed)
EKG done today and present, with review present and stable from prior. She is on timolol eye drops which could contribute and should not be on any further AV blocking agents.  ?

## 2021-09-11 NOTE — Assessment & Plan Note (Signed)
Suspect that her recent SOB on exertion is related to recent Covid-19. EKG done today which is unchanged from prior and lungs clear clinically. She does have fatigue and naps during the day.  ?

## 2021-09-11 NOTE — Assessment & Plan Note (Signed)
Rx myrbetriq 50 mg daily to take for incontinence. She will take this for 1 month then contact us with effectiveness.  ?

## 2021-09-15 ENCOUNTER — Other Ambulatory Visit: Payer: Self-pay | Admitting: Internal Medicine

## 2021-10-30 DIAGNOSIS — H353132 Nonexudative age-related macular degeneration, bilateral, intermediate dry stage: Secondary | ICD-10-CM | POA: Diagnosis not present

## 2021-10-30 DIAGNOSIS — H524 Presbyopia: Secondary | ICD-10-CM | POA: Diagnosis not present

## 2021-10-30 DIAGNOSIS — H04123 Dry eye syndrome of bilateral lacrimal glands: Secondary | ICD-10-CM | POA: Diagnosis not present

## 2021-10-30 DIAGNOSIS — H401132 Primary open-angle glaucoma, bilateral, moderate stage: Secondary | ICD-10-CM | POA: Diagnosis not present

## 2021-10-31 ENCOUNTER — Ambulatory Visit: Payer: Medicare PPO | Admitting: Family Medicine

## 2021-10-31 ENCOUNTER — Encounter: Payer: Self-pay | Admitting: Family Medicine

## 2021-10-31 ENCOUNTER — Encounter (HOSPITAL_COMMUNITY): Payer: Self-pay

## 2021-10-31 ENCOUNTER — Other Ambulatory Visit: Payer: Self-pay

## 2021-10-31 ENCOUNTER — Emergency Department (HOSPITAL_COMMUNITY): Payer: Medicare PPO

## 2021-10-31 ENCOUNTER — Inpatient Hospital Stay (HOSPITAL_COMMUNITY)
Admission: EM | Admit: 2021-10-31 | Discharge: 2021-11-08 | DRG: 988 | Disposition: A | Discharge status: Home Health | Payer: Medicare PPO | Attending: Internal Medicine | Admitting: Internal Medicine

## 2021-10-31 VITALS — BP 140/70 | HR 85 | Temp 97.6°F | Ht 59.25 in | Wt 133.0 lb

## 2021-10-31 DIAGNOSIS — K828 Other specified diseases of gallbladder: Secondary | ICD-10-CM | POA: Diagnosis not present

## 2021-10-31 DIAGNOSIS — Z87891 Personal history of nicotine dependence: Secondary | ICD-10-CM | POA: Diagnosis not present

## 2021-10-31 DIAGNOSIS — K219 Gastro-esophageal reflux disease without esophagitis: Secondary | ICD-10-CM | POA: Diagnosis present

## 2021-10-31 DIAGNOSIS — N1831 Chronic kidney disease, stage 3a: Secondary | ICD-10-CM | POA: Diagnosis not present

## 2021-10-31 DIAGNOSIS — Z634 Disappearance and death of family member: Secondary | ICD-10-CM

## 2021-10-31 DIAGNOSIS — Z8249 Family history of ischemic heart disease and other diseases of the circulatory system: Secondary | ICD-10-CM

## 2021-10-31 DIAGNOSIS — E876 Hypokalemia: Secondary | ICD-10-CM | POA: Diagnosis present

## 2021-10-31 DIAGNOSIS — Z96643 Presence of artificial hip joint, bilateral: Secondary | ICD-10-CM | POA: Diagnosis present

## 2021-10-31 DIAGNOSIS — K838 Other specified diseases of biliary tract: Secondary | ICD-10-CM | POA: Diagnosis not present

## 2021-10-31 DIAGNOSIS — R5383 Other fatigue: Secondary | ICD-10-CM

## 2021-10-31 DIAGNOSIS — R7989 Other specified abnormal findings of blood chemistry: Secondary | ICD-10-CM | POA: Diagnosis present

## 2021-10-31 DIAGNOSIS — K5750 Diverticulosis of both small and large intestine without perforation or abscess without bleeding: Secondary | ICD-10-CM | POA: Diagnosis present

## 2021-10-31 DIAGNOSIS — Z961 Presence of intraocular lens: Secondary | ICD-10-CM | POA: Diagnosis present

## 2021-10-31 DIAGNOSIS — I130 Hypertensive heart and chronic kidney disease with heart failure and stage 1 through stage 4 chronic kidney disease, or unspecified chronic kidney disease: Secondary | ICD-10-CM | POA: Diagnosis present

## 2021-10-31 DIAGNOSIS — M79605 Pain in left leg: Secondary | ICD-10-CM | POA: Insufficient documentation

## 2021-10-31 DIAGNOSIS — I447 Left bundle-branch block, unspecified: Secondary | ICD-10-CM | POA: Diagnosis present

## 2021-10-31 DIAGNOSIS — K5900 Constipation, unspecified: Secondary | ICD-10-CM | POA: Diagnosis present

## 2021-10-31 DIAGNOSIS — Z886 Allergy status to analgesic agent status: Secondary | ICD-10-CM

## 2021-10-31 DIAGNOSIS — Z882 Allergy status to sulfonamides status: Secondary | ICD-10-CM

## 2021-10-31 DIAGNOSIS — K8044 Calculus of bile duct with chronic cholecystitis without obstruction: Secondary | ICD-10-CM | POA: Diagnosis not present

## 2021-10-31 DIAGNOSIS — I454 Nonspecific intraventricular block: Secondary | ICD-10-CM

## 2021-10-31 DIAGNOSIS — H409 Unspecified glaucoma: Secondary | ICD-10-CM | POA: Diagnosis present

## 2021-10-31 DIAGNOSIS — I499 Cardiac arrhythmia, unspecified: Secondary | ICD-10-CM | POA: Diagnosis not present

## 2021-10-31 DIAGNOSIS — Z7982 Long term (current) use of aspirin: Secondary | ICD-10-CM

## 2021-10-31 DIAGNOSIS — Z01810 Encounter for preprocedural cardiovascular examination: Secondary | ICD-10-CM

## 2021-10-31 DIAGNOSIS — Z9889 Other specified postprocedural states: Secondary | ICD-10-CM | POA: Diagnosis not present

## 2021-10-31 DIAGNOSIS — D631 Anemia in chronic kidney disease: Secondary | ICD-10-CM | POA: Diagnosis present

## 2021-10-31 DIAGNOSIS — Z8711 Personal history of peptic ulcer disease: Secondary | ICD-10-CM

## 2021-10-31 DIAGNOSIS — R5381 Other malaise: Secondary | ICD-10-CM | POA: Diagnosis present

## 2021-10-31 DIAGNOSIS — K449 Diaphragmatic hernia without obstruction or gangrene: Secondary | ICD-10-CM | POA: Diagnosis present

## 2021-10-31 DIAGNOSIS — Z9841 Cataract extraction status, right eye: Secondary | ICD-10-CM | POA: Diagnosis not present

## 2021-10-31 DIAGNOSIS — K3189 Other diseases of stomach and duodenum: Secondary | ICD-10-CM | POA: Diagnosis not present

## 2021-10-31 DIAGNOSIS — I129 Hypertensive chronic kidney disease with stage 1 through stage 4 chronic kidney disease, or unspecified chronic kidney disease: Secondary | ICD-10-CM | POA: Diagnosis not present

## 2021-10-31 DIAGNOSIS — N179 Acute kidney failure, unspecified: Principal | ICD-10-CM | POA: Diagnosis present

## 2021-10-31 DIAGNOSIS — Z96651 Presence of right artificial knee joint: Secondary | ICD-10-CM | POA: Diagnosis present

## 2021-10-31 DIAGNOSIS — K222 Esophageal obstruction: Secondary | ICD-10-CM | POA: Diagnosis not present

## 2021-10-31 DIAGNOSIS — M79604 Pain in right leg: Secondary | ICD-10-CM | POA: Diagnosis not present

## 2021-10-31 DIAGNOSIS — E785 Hyperlipidemia, unspecified: Secondary | ICD-10-CM | POA: Diagnosis present

## 2021-10-31 DIAGNOSIS — R600 Localized edema: Secondary | ICD-10-CM | POA: Insufficient documentation

## 2021-10-31 DIAGNOSIS — I872 Venous insufficiency (chronic) (peripheral): Secondary | ICD-10-CM

## 2021-10-31 DIAGNOSIS — G709 Myoneural disorder, unspecified: Secondary | ICD-10-CM | POA: Diagnosis not present

## 2021-10-31 DIAGNOSIS — K8051 Calculus of bile duct without cholangitis or cholecystitis with obstruction: Secondary | ICD-10-CM | POA: Diagnosis not present

## 2021-10-31 DIAGNOSIS — R944 Abnormal results of kidney function studies: Secondary | ICD-10-CM | POA: Diagnosis present

## 2021-10-31 DIAGNOSIS — S32511A Fracture of superior rim of right pubis, initial encounter for closed fracture: Secondary | ICD-10-CM | POA: Diagnosis not present

## 2021-10-31 DIAGNOSIS — I5032 Chronic diastolic (congestive) heart failure: Secondary | ICD-10-CM | POA: Diagnosis not present

## 2021-10-31 DIAGNOSIS — G4700 Insomnia, unspecified: Secondary | ICD-10-CM | POA: Diagnosis present

## 2021-10-31 DIAGNOSIS — N189 Chronic kidney disease, unspecified: Principal | ICD-10-CM

## 2021-10-31 DIAGNOSIS — Z885 Allergy status to narcotic agent status: Secondary | ICD-10-CM

## 2021-10-31 DIAGNOSIS — Z9842 Cataract extraction status, left eye: Secondary | ICD-10-CM

## 2021-10-31 DIAGNOSIS — Z79899 Other long term (current) drug therapy: Secondary | ICD-10-CM

## 2021-10-31 DIAGNOSIS — N289 Disorder of kidney and ureter, unspecified: Secondary | ICD-10-CM | POA: Diagnosis not present

## 2021-10-31 DIAGNOSIS — K8020 Calculus of gallbladder without cholecystitis without obstruction: Secondary | ICD-10-CM | POA: Diagnosis not present

## 2021-10-31 DIAGNOSIS — K8070 Calculus of gallbladder and bile duct without cholecystitis without obstruction: Secondary | ICD-10-CM | POA: Diagnosis not present

## 2021-10-31 DIAGNOSIS — R935 Abnormal findings on diagnostic imaging of other abdominal regions, including retroperitoneum: Secondary | ICD-10-CM | POA: Diagnosis not present

## 2021-10-31 DIAGNOSIS — K8010 Calculus of gallbladder with chronic cholecystitis without obstruction: Secondary | ICD-10-CM | POA: Diagnosis not present

## 2021-10-31 DIAGNOSIS — K8050 Calculus of bile duct without cholangitis or cholecystitis without obstruction: Secondary | ICD-10-CM | POA: Diagnosis not present

## 2021-10-31 DIAGNOSIS — Z01818 Encounter for other preprocedural examination: Secondary | ICD-10-CM | POA: Diagnosis not present

## 2021-10-31 DIAGNOSIS — K5730 Diverticulosis of large intestine without perforation or abscess without bleeding: Secondary | ICD-10-CM | POA: Diagnosis not present

## 2021-10-31 DIAGNOSIS — S32512A Fracture of superior rim of left pubis, initial encounter for closed fracture: Secondary | ICD-10-CM | POA: Diagnosis not present

## 2021-10-31 DIAGNOSIS — K8064 Calculus of gallbladder and bile duct with chronic cholecystitis without obstruction: Secondary | ICD-10-CM | POA: Diagnosis present

## 2021-10-31 LAB — CBC WITH DIFFERENTIAL/PLATELET
Abs Immature Granulocytes: 0.01 10*3/uL (ref 0.00–0.07)
Basophils Absolute: 0.1 10*3/uL (ref 0.0–0.1)
Basophils Absolute: 0.1 10*3/uL (ref 0.0–0.1)
Basophils Relative: 1.6 % (ref 0.0–3.0)
Basophils Relative: 1 %
Eosinophils Absolute: 0 10*3/uL (ref 0.0–0.7)
Eosinophils Absolute: 0 10*3/uL (ref 0.0–0.5)
Eosinophils Relative: 0.3 % (ref 0.0–5.0)
Eosinophils Relative: 0 %
HCT: 34.9 % — ABNORMAL LOW (ref 36.0–46.0)
HCT: 34.4 % — ABNORMAL LOW (ref 36.0–46.0)
Hemoglobin: 11.7 g/dL — ABNORMAL LOW (ref 12.0–15.0)
Hemoglobin: 11.4 g/dL — ABNORMAL LOW (ref 12.0–15.0)
Immature Granulocytes: 0 %
Lymphocytes Relative: 24.1 % (ref 12.0–46.0)
Lymphocytes Relative: 33 %
Lymphs Abs: 1.4 10*3/uL (ref 0.7–4.0)
Lymphs Abs: 2.1 10*3/uL (ref 0.7–4.0)
MCH: 30.1 pg (ref 26.0–34.0)
MCHC: 33.4 g/dL (ref 30.0–36.0)
MCHC: 33.1 g/dL (ref 30.0–36.0)
MCV: 89.1 fl (ref 78.0–100.0)
MCV: 90.8 fL (ref 80.0–100.0)
Monocytes Absolute: 0.5 10*3/uL (ref 0.1–1.0)
Monocytes Absolute: 0.6 10*3/uL (ref 0.1–1.0)
Monocytes Relative: 8.3 % (ref 3.0–12.0)
Monocytes Relative: 10 %
Neutro Abs: 3.7 10*3/uL (ref 1.4–7.7)
Neutro Abs: 3.7 10*3/uL (ref 1.7–7.7)
Neutrophils Relative %: 65.7 % (ref 43.0–77.0)
Neutrophils Relative %: 56 %
Platelets: 188 10*3/uL (ref 150.0–400.0)
Platelets: 200 10*3/uL (ref 150–400)
RBC: 3.92 Mil/uL (ref 3.87–5.11)
RBC: 3.79 MIL/uL — ABNORMAL LOW (ref 3.87–5.11)
RDW: 14.2 % (ref 11.5–15.5)
RDW: 13.3 % (ref 11.5–15.5)
WBC: 5.7 10*3/uL (ref 4.0–10.5)
WBC: 6.6 10*3/uL (ref 4.0–10.5)
nRBC: 0 % (ref 0.0–0.2)

## 2021-10-31 LAB — COMPREHENSIVE METABOLIC PANEL
ALT: 16 U/L (ref 0–35)
AST: 18 U/L (ref 0–37)
Albumin: 3.8 g/dL (ref 3.5–5.2)
Alkaline Phosphatase: 83 U/L (ref 39–117)
BUN: 50 mg/dL — ABNORMAL HIGH (ref 6–23)
CO2: 32 mEq/L (ref 19–32)
Calcium: 12.2 mg/dL — ABNORMAL HIGH (ref 8.4–10.5)
Chloride: 103 mEq/L (ref 96–112)
Creatinine, Ser: 2.72 mg/dL — ABNORMAL HIGH (ref 0.40–1.20)
GFR: 15.04 mL/min — ABNORMAL LOW (ref >60.00)
Glucose, Bld: 104 mg/dL — ABNORMAL HIGH (ref 70–99)
Potassium: 4 mEq/L (ref 3.5–5.1)
Sodium: 144 mEq/L (ref 135–145)
Total Bilirubin: 0.6 mg/dL (ref 0.2–1.2)
Total Protein: 6.8 g/dL (ref 6.0–8.3)

## 2021-10-31 LAB — BASIC METABOLIC PANEL
Anion gap: 9 (ref 5–15)
BUN: 52 mg/dL — ABNORMAL HIGH (ref 8–23)
CO2: 27 mmol/L (ref 22–32)
Calcium: 11.7 mg/dL — ABNORMAL HIGH (ref 8.9–10.3)
Chloride: 105 mmol/L (ref 98–111)
Creatinine, Ser: 2.89 mg/dL — ABNORMAL HIGH (ref 0.44–1.00)
GFR, Estimated: 15 mL/min — ABNORMAL LOW (ref >60)
Glucose, Bld: 95 mg/dL (ref 70–99)
Potassium: 4.2 mmol/L (ref 3.5–5.1)
Sodium: 141 mmol/L (ref 135–145)

## 2021-10-31 LAB — BRAIN NATRIURETIC PEPTIDE
B Natriuretic Peptide: 206.4 pg/mL — ABNORMAL HIGH (ref 0.0–100.0)
Pro B Natriuretic peptide (BNP): 145 pg/mL — ABNORMAL HIGH (ref 0.0–100.0)

## 2021-10-31 LAB — T4, FREE: Free T4: 1.15 ng/dL (ref 0.60–1.60)

## 2021-10-31 LAB — URINALYSIS, ROUTINE W REFLEX MICROSCOPIC
Bilirubin Urine: NEGATIVE
Glucose, UA: NEGATIVE mg/dL
Hgb urine dipstick: NEGATIVE
Ketones, ur: NEGATIVE mg/dL
Nitrite: NEGATIVE
Protein, ur: NEGATIVE mg/dL
Specific Gravity, Urine: 1.016 (ref 1.005–1.030)
pH: 6 (ref 5.0–8.0)

## 2021-10-31 LAB — TSH: TSH: 4.07 u[IU]/mL (ref 0.35–5.50)

## 2021-10-31 MED ORDER — ENOXAPARIN SODIUM 30 MG/0.3ML IJ SOSY
30.0000 mg | PREFILLED_SYRINGE | INTRAMUSCULAR | Status: DC
  Administered 2021-11-01 – 2021-11-03 (×3): 30 mg via SUBCUTANEOUS
  Filled 2021-10-31 (×3): qty 0.3

## 2021-10-31 NOTE — Progress Notes (Signed)
Subjective:     Patient ID: Leah [name], female    DOB: 01/29/1930, 88 y.o.   MRN: [id number]  Chief Complaint  Patient presents with   gait issue    Has been feeling off balance recently , legs feel like they weigh heavy which makes it hard    HPI Patient is in today for a 6 month history of her legs feeling "heavy". States her legs feel like they are "100 lbs each".  Reports having less mobility than in the past. No falls.  States she feels better today than yesterday.   She is exercising with a trainer at [employer] on [address] states she cannot exercise today. States last week she did a lot of walking. She was supposed to ride a stationary bike today.   States she sits a lot but also walks a lot. Avoids walking on the grass or uneven surfaces.   States she has good and bad days.  Hx of leg wounds treated at the wound care center. Denies any new wounds.  She is wearing compression socks which was recommended by Vascular surgeon.    States she saw vein and vascular and thinks she may need to go back. States they did not find anything abnormal but her legs feel worse than when she was there.   No numbness, tingling or pins and needles sensation. No focal weakness.   Denies fever, chills, dizziness, chest pain, palpitations, abdominal pain, N/V/D, urinary symptoms.       Health Maintenance Due  Topic Date Due   Zoster Vaccines- Shingrix (1 of 2) Never done   TETANUS/TDAP  09/23/2021    Past Medical History:  Diagnosis Date   Acute maxillary sinusitis    Allergy    Bundle branch block, unspecified    EKG 4/13 with clearance and note that isnt new block Dr Arnoldo Morale on chart   Carpal tunnel syndrome    Cystocele    Detrusor instability    GERD (gastroesophageal reflux disease)    Glaucoma    Hyperlipidemia    Localized osteoarthrosis not specified whether primary or secondary, lower leg    Osteoporosis 01/2014   T score -2.5 UD forarm, stable at the  spine recommend 2 year follow up DEXA   PONV (postoperative nausea and vomiting)    19 yrs ago- states has had general anesthesia since and did well   Unspecified adverse effect of unspecified drug, medicinal and biological substance    Uterine prolapse     Past Surgical History:  Procedure Laterality Date   arthroscopic r knee     CARPAL TUNNEL RELEASE  2011   COLONOSCOPY     DILATION AND CURETTAGE OF UTERUS  1982   EYE SURGERY     bilateral cataract extraction  with IOL   FLEXIBLE SIGMOIDOSCOPY     HYSTEROSCOPY     HYSTEROSCOPY WITH D & C  2005 and 2008   IR SACROPLASTY BILATERAL  06/07/2020   JOINT REPLACEMENT     bilateral total hip arthroplasty   total hip rerplacement bilateral     TOTAL KNEE ARTHROPLASTY  10/13/2011   Procedure: TOTAL KNEE ARTHROPLASTY;  Surgeon: Gearlean Alf, MD;  Location: [facility];  Service: Orthopedics;  Laterality: Right;   UPPER GI ENDOSCOPY  07/2018 and 3/20    Family History  Problem Relation Age of Onset   Dementia Mother    Breast cancer Mother 87   Heart disease Father  Stroke Father    Dementia Sister    Colon cancer Neg Hx    Stomach cancer Neg Hx    Rectal cancer Neg Hx    Esophageal cancer Neg Hx    Colon polyps Neg Hx    Pancreatic cancer Neg Hx     Social History   Socioeconomic History   Marital status: Widowed    Spouse name: Not on file   Number of children: 2   Years of education: Not on file   Highest education level: Not on file  Occupational History   Occupation: retired  Tobacco Use   Smoking status: Former    Types: Cigarettes    Quit date: 10/06/1958    Years since quitting: 63.1   Smokeless tobacco: Never  Vaping Use   Vaping Use: Never used  Substance and Sexual Activity   Alcohol use: Not Currently   Drug use: No   Sexual activity: Not Currently    Birth control/protection: Post-menopausal    Comment: 1st intercourse 89 yo-1 partner  Other Topics Concern   Not on file  Social History Narrative    Not on file   Social Determinants of Health   Financial Resource Strain: Low Risk    Difficulty of Paying Living Expenses: Not hard at all  Food Insecurity: No Food Insecurity   Worried About Charity fundraiser in the Last Year: Never true   [place] in the Last Year: Never true  Transportation Needs: No Transportation Needs   Lack of Transportation (Medical): No   Lack of Transportation (Non-Medical): No  Physical Activity: Sufficiently Active   Days of Exercise per Week: 5 days   Minutes of Exercise per Session: 30 min  Stress: No Stress Concern Present   Feeling of Stress : Not at all  Social Connections: Moderately Integrated   Frequency of Communication with Friends and Family: More than three times a week   Frequency of Social Gatherings with Friends and Family: More than three times a week   Attends Religious Services: More than 4 times per year   Active Member of [employer] or Organizations: Yes   Attends Archivist Meetings: More than 4 times per year   Marital Status: Widowed  Human resources officer Violence: Not At Risk   Fear of Current or Ex-Partner: No   Emotionally Abused: No   Physically Abused: No   Sexually Abused: No    Outpatient Medications Prior to Visit  Medication Sig Dispense Refill   acetaminophen (TYLENOL) 650 MG CR tablet Take 1,300 mg by mouth every 8 (eight) hours as needed for pain.     Apoaequorin (PREVAGEN) 10 MG CAPS Take 20 mg by mouth daily.     Ascorbic Acid (VITAMIN C) 1000 MG tablet Take 1,000 mg by mouth daily.     aspirin EC 81 MG tablet Take 81 mg by mouth daily. Swallow whole.     CALCIUM-MAGNESIUM-ZINC PO Take 3 tablets by mouth daily.     Cholecalciferol (DIALYVITE VITAMIN D 5000) 125 MCG (5000 UT) capsule Take 5,000 Units by mouth daily.     denosumab (PROLIA) 60 MG/ML SOLN injection Inject 60 mg into the skin every 6 (six) months. Administer in upper arm, thigh, or abdomen     diclofenac (VOLTAREN) 75 MG EC tablet TAKE  ONE TABLET BY MOUTH TWICE DAILY 60 tablet 3   latanoprost (XALATAN) 0.005 % ophthalmic solution Place 1 drop into both eyes at bedtime.     mirabegron  ER (MYRBETRIQ) 50 MG TB24 tablet Take 1 tablet (50 mg total) by mouth daily. 90 tablet 1   pantoprazole (PROTONIX) 40 MG tablet Take 1 tablet (40 mg total) by mouth 2 (two) times daily. 60 tablet 11   timolol (TIMOPTIC) 0.5 % ophthalmic solution Place 1 drop into both eyes daily.     vitamin E 400 UNIT capsule Take 400 Units by mouth daily.     No facility-administered medications prior to visit.    Allergies  Allergen Reactions   Sulfa Antibiotics Rash   Sulfasalazine Hives and Rash   Nabumetone Other (See Comments)    Feel weird     Naproxen Sodium Hives   Tramadol Hcl Nausea And Vomiting    ROS Pertinent positives and negatives in the history of present illness.     Objective:    Physical Exam Constitutional:      General: She is not in acute distress.    Appearance: She is not ill-appearing.  Eyes:     Conjunctiva/sclera: Conjunctivae normal.     Pupils: Pupils are equal, round, and reactive to light.  Cardiovascular:     Rate and Rhythm: Normal rate.     Pulses: Normal pulses.  Pulmonary:     Effort: Pulmonary effort is normal.     Breath sounds: Normal breath sounds.  Musculoskeletal:     Cervical back: Normal range of motion and neck supple.     Right lower leg: Edema present.     Left lower leg: Edema present.  Skin:    General: Skin is warm and dry.  Neurological:     General: No focal deficit present.     Mental Status: She is alert and oriented to person, place, and time.     Sensory: No sensory deficit.     Motor: No weakness.  Psychiatric:        Mood and Affect: Mood normal.    BP 140/70 (BP Location: Left Arm, Patient Position: Sitting, Cuff Size: Normal)   Pulse 85   Temp 97.6 F (36.4 C) (Temporal)   Ht 4' 11.25" (1.505 m)   Wt 133 lb (60.3 kg)   SpO2 97%   BMI 26.64 kg/m  Wt Readings  from Last 3 Encounters:  10/31/21 133 lb (60.3 kg)  10/31/21 133 lb (60.3 kg)  09/10/21 133 lb 3.2 oz (60.4 kg)       Assessment & Plan:   Problem List Items Addressed This Visit       Cardiovascular and Mediastinum   Venous insufficiency    Well healed wounds on legs. Wearing compression stockings. Mild edema.        Relevant Orders   CBC with Differential/Platelet (Completed)   Comprehensive metabolic panel (Completed)     Other   Bilateral leg pain - Primary    No sign of obstruction, DVT or infection. Leg wounds well healed. Mild edema.        Relevant Orders   CBC with Differential/Platelet (Completed)   Comprehensive metabolic panel (Completed)   Other fatigue    Ongoing fatigue, unchanged and with activity.        Relevant Orders   TSH (Completed)   T4, free (Completed)   Pedal edema    Continue wearing compression stockings and elevate legs when sitting. Check labs including BNP. She is euvolemic appearing.        Relevant Orders   CBC with Differential/Platelet (Completed)   Comprehensive metabolic panel (Completed)   Brain natriuretic peptide (  Completed)   Visit time 22 minutes in face to face communication with patient and coordination of care, additional 11 minutes spent in record review, coordination or care, ordering tests, communicating/referring to other healthcare professionals, documenting in medical records all on the same day of the visit for total time 33 minutes spent on the visit.   I am having Ronita Hipps maintain her latanoprost, vitamin C, vitamin E, denosumab, CALCIUM-MAGNESIUM-ZINC PO, acetaminophen, aspirin EC, Dialyvite Vitamin D 5000, pantoprazole, Prevagen, timolol, mirabegron ER, and diclofenac.  No orders of the defined types were placed in this encounter.

## 2021-10-31 NOTE — Assessment & Plan Note (Signed)
Continue wearing compression stockings and elevate legs when sitting. Check labs including BNP. She is euvolemic appearing.

## 2021-10-31 NOTE — ED Notes (Signed)
In radiology at this time

## 2021-10-31 NOTE — ED Notes (Signed)
Provider at bedside

## 2021-10-31 NOTE — Assessment & Plan Note (Signed)
Well healed wounds on legs. Wearing compression stockings. Mild edema.

## 2021-10-31 NOTE — ED Provider Triage Note (Signed)
Emergency Medicine Provider Triage Evaluation Note  Leah [name] , a 88 y.o. female  was evaluated in triage.  Pt complains of abnormal lab.  Patient states that she was seen at her PCP earlier today, had labs drawn.  Patient states that she was called later this afternoon and advised that she was in renal failure.  On chart review, it appears that the patient's creatinine was 2.72.  This is increased from 1.07 4 months ago.  Patient states that she urinated twice earlier today and it was largely normal for her.  The patient states that she recently started Myrbetriq.  Patient states that she has been eating and drinking consistently.  Patient denies any chest pain, shortness of breath, nausea, vomiting, muscle weakness.  Review of Systems  Positive:  Negative:   Physical Exam  BP (!) 181/89 (BP Location: Right Arm)   Pulse 79   Temp 98.7 F (37.1 C) (Oral)   Resp 14   SpO2 95%  Gen:   Awake, no distress   Resp:  Normal effort  MSK:   Moves extremities without difficulty  Other:    Medical Decision Making  Medically screening exam initiated at 7:39 PM.  Appropriate orders placed.  Leah [name] was informed that the remainder of the evaluation will be completed by another provider, this initial triage assessment does not replace that evaluation, and the importance of remaining in the ED until their evaluation is complete.     Azucena Cecil, PA-C 10/31/21 1942

## 2021-10-31 NOTE — Assessment & Plan Note (Signed)
No sign of obstruction, DVT or infection. Leg wounds well healed. Mild edema.

## 2021-10-31 NOTE — Progress Notes (Addendum)
Called patient with lab results. Advised her that she is in acute renal failure and that she needs to go to the [facility] ED for further evaluation and treatment.  Estimated Creatinine Clearance: 10.5 mL/min (A) (by C-G formula based on SCr of 2.89 mg/dL (H)).

## 2021-10-31 NOTE — ED Notes (Signed)
Per pt intermittently not felt good over the last several months. Has felt like bilat legs are extremely heavy and weak. Seen at PCP today due to not feeling well yesterday. However today pt has no complaints at this time. Received call earlier today from PCP and advised abnormal lab work needed to come to the ED for same.

## 2021-10-31 NOTE — H&P (Signed)
History and Physical  Leah [name] WLN:[id number] DOB: [birth year] DOA: 10/31/2021  Referring physician: Dr. Darl Householder. PCP: Hoyt Koch, MD  Outpatient Specialists: None. Patient coming from: Home.  Chief Complaint: Abnormal labs.  HPI: Leah [name] is a 88 y.o. female with medical history significant for GERD, venous insufficiency, LBBB, who presented to [facility] ED at the request of her PCP due to abnormal labs.  For the past few days the patient has had poor appetite and heaviness in her legs bilaterally.  No abdominal pain or vomiting.  No reported fevers or chills.  No reported chest pain or dyspnea.  She made an appointment to see her PCP due to her vague symptoms and feeling unwell.  They drew blood and sent her home where she later received a call to present to the ED due to abnormal lab findings.  While in the ED, work-up revealed AKI with creatinine of 2.89 from normal baseline.  Additionally, BNP was minimally elevated 206.  CT renal with no evidence of hydronephrosis.  Showing findings consistent with choledocholithiasis, further evaluation with MRCP is recommended.  The patient was admitted by hospitalist service, [facility].  ED Course: Tmax 98.7.  BP 167/111, pulse 78, respiratory 18, saturation 95% on room air.  Lab studies remarkable for BUN 52, creatinine 2.89 from baseline of 1.0 GFR 49.  BNP 206.  Hemoglobin 11.4, MCV 90.  Review of Systems: Review of systems as noted in the HPI. All other systems reviewed and are negative.   Past Medical History:  Diagnosis Date   Acute maxillary sinusitis    Allergy    Bundle branch block, unspecified    EKG 4/13 with clearance and note that isnt new block Dr Arnoldo Morale on chart   Carpal tunnel syndrome    Cystocele    Detrusor instability    GERD (gastroesophageal reflux disease)    Glaucoma    Hyperlipidemia    Localized osteoarthrosis not specified whether primary or secondary, lower leg    Osteoporosis 01/2014   T score -2.5 UD  forarm, stable at the spine recommend 2 year follow up DEXA   PONV (postoperative nausea and vomiting)    19 yrs ago- states has had general anesthesia since and did well   Unspecified adverse effect of unspecified drug, medicinal and biological substance    Uterine prolapse    Past Surgical History:  Procedure Laterality Date   arthroscopic r knee     CARPAL TUNNEL RELEASE  2011   COLONOSCOPY     DILATION AND CURETTAGE OF UTERUS  1982   EYE SURGERY     bilateral cataract extraction  with IOL   FLEXIBLE SIGMOIDOSCOPY     HYSTEROSCOPY     HYSTEROSCOPY WITH D & C  2005 and 2008   IR SACROPLASTY BILATERAL  06/07/2020   JOINT REPLACEMENT     bilateral total hip arthroplasty   total hip rerplacement bilateral     TOTAL KNEE ARTHROPLASTY  10/13/2011   Procedure: TOTAL KNEE ARTHROPLASTY;  Surgeon: Gearlean Alf, MD;  Location: [facility] ORS;  Service: Orthopedics;  Laterality: Right;   UPPER GI ENDOSCOPY  07/2018 and 3/20    Social History:  reports that she quit smoking about 63 years ago. Her smoking use included cigarettes. She has never used smokeless tobacco. She reports that she does not currently use alcohol. She reports that she does not use drugs.   Allergies  Allergen Reactions   Sulfa Antibiotics Rash   Sulfasalazine Hives and  Rash   Nabumetone Other (See Comments)    Feel weird     Naproxen Sodium Hives   Tramadol Hcl Nausea And Vomiting    Family History  Problem Relation Age of Onset   Dementia Mother    Breast cancer Mother 52   Heart disease Father    Stroke Father    Dementia Sister    Colon cancer Neg Hx    Stomach cancer Neg Hx    Rectal cancer Neg Hx    Esophageal cancer Neg Hx    Colon polyps Neg Hx    Pancreatic cancer Neg Hx       Prior to Admission medications   Medication Sig Start Date End Date Taking? Authorizing Provider  acetaminophen (TYLENOL) 650 MG CR tablet Take 1,300 mg by mouth every 8 (eight) hours as needed for pain.    [provider]  Apoaequorin (PREVAGEN) 10 MG CAPS Take 20 mg by mouth daily.    [provider]  Ascorbic Acid (VITAMIN C) 1000 MG tablet Take 1,000 mg by mouth daily.    [provider]  aspirin EC 81 MG tablet Take 81 mg by mouth daily. Swallow whole.    [provider]  CALCIUM-MAGNESIUM-ZINC PO Take 3 tablets by mouth daily.    [provider]  Cholecalciferol (DIALYVITE VITAMIN D 5000) 125 MCG (5000 UT) capsule Take 5,000 Units by mouth daily.    [provider]  denosumab (PROLIA) 60 MG/ML SOLN injection Inject 60 mg into the skin every 6 (six) months. Administer in upper arm, thigh, or abdomen    [provider]  diclofenac (VOLTAREN) 75 MG EC tablet TAKE ONE TABLET BY MOUTH TWICE DAILY 09/16/21   Hoyt Koch, MD  latanoprost (XALATAN) 0.005 % ophthalmic solution Place 1 drop into both eyes at bedtime.    [provider]  mirabegron ER (MYRBETRIQ) 50 MG TB24 tablet Take 1 tablet (50 mg total) by mouth daily. 09/10/21   Hoyt Koch, MD  pantoprazole (PROTONIX) 40 MG tablet Take 1 tablet (40 mg total) by mouth 2 (two) times daily. 12/31/20   Ladene Artist, MD  timolol (TIMOPTIC) 0.5 % ophthalmic solution Place 1 drop into both eyes daily. 07/08/21   [provider]  vitamin E 400 UNIT capsule Take 400 Units by mouth daily.    [provider]    Physical Exam: BP (!) 198/91   Pulse 74   Temp 98.7 F (37.1 C) (Oral)   Resp 18   Ht '4\' 11"'$  (1.499 m)   Wt 60.3 kg   SpO2 91%   BMI 26.86 kg/m   General: 88 y.o. year-old female well developed well nourished in no acute distress.  Alert and oriented x3. Cardiovascular: Regular rate and rhythm with no rubs or gallops.  No thyromegaly or JVD noted.  No lower extremity edema. 2/4 pulses in all 4 extremities. Respiratory: Clear to auscultation with no wheezes or rales. Good inspiratory effort. Abdomen: Soft nontender nondistended with normal bowel  sounds x4 quadrants. Muskuloskeletal: No cyanosis, clubbing or edema noted bilaterally Neuro: CN II-XII intact, strength, sensation, reflexes Skin: No ulcerative lesions noted or rashes Psychiatry: Judgement and insight appear normal. Mood is appropriate for condition and setting          Labs on Admission:  Basic Metabolic Panel: Recent Labs  Lab 10/31/21 1017 10/31/21 2115  NA 144 141  K 4.0 4.2  CL 103 105  CO2 32 27  GLUCOSE  104* 95  BUN 50* 52*  CREATININE 2.72* 2.89*  CALCIUM 12.2* 11.7*   Liver Function Tests: Recent Labs  Lab 10/31/21 1017  AST 18  ALT 16  ALKPHOS 83  BILITOT 0.6  PROT 6.8  ALBUMIN 3.8   No results for input(s): LIPASE, AMYLASE in the last 168 hours. No results for input(s): AMMONIA in the last 168 hours. CBC: Recent Labs  Lab 10/31/21 1017 10/31/21 2115  WBC 5.7 6.6  NEUTROABS 3.7 3.7  HGB 11.7* 11.4*  HCT 34.9* 34.4*  MCV 89.1 90.8  PLT 188.0 200   Cardiac Enzymes: No results for input(s): CKTOTAL, CKMB, CKMBINDEX, TROPONINI in the last 168 hours.  BNP (last 3 results) Recent Labs    10/31/21 2011  BNP 206.4*    ProBNP (last 3 results) Recent Labs    10/31/21 1017  PROBNP 145.0*    CBG: No results for input(s): GLUCAP in the last 168 hours.  Radiological Exams on Admission: CT Renal Stone Study  Result Date: 10/31/2021 CLINICAL DATA:  Acute kidney injury. EXAM: CT ABDOMEN AND PELVIS WITHOUT CONTRAST TECHNIQUE: Multidetector CT imaging of the abdomen and pelvis was performed following the standard protocol without IV contrast. RADIATION DOSE REDUCTION: This exam was performed according to the departmental dose-optimization program which includes automated exposure control, adjustment of the mA and/or kV according to patient size and/or use of iterative reconstruction technique. COMPARISON:  None Available. FINDINGS: Lower chest: No acute abnormality. Hepatobiliary: No focal liver abnormality is seen. The gallbladder is  moderately distended without evidence of gallbladder wall thickening or pericholecystic inflammation. Multiple ill-defined, approximately 7 mm hyperdense foci are seen within a dilated common bile duct (axial CT images 25, 28 and 31, CT series 3). Pancreas: Unremarkable. No pancreatic ductal dilatation or surrounding inflammatory changes. Spleen: Normal in size without focal abnormality. Adrenals/Urinary Tract: Adrenal glands are unremarkable. Kidneys are normal, without renal calculi, focal lesion, or hydronephrosis. Bladder is unremarkable. Stomach/Bowel: Stomach is within normal limits. Appendix appears normal. No evidence of bowel wall thickening, distention, or inflammatory changes. Noninflamed diverticula are seen throughout the large bowel. Vascular/Lymphatic: Aortic atherosclerosis. No enlarged abdominal or pelvic lymph nodes. Reproductive: Uterus and bilateral adnexa are limited in evaluation secondary to overlying streak artifact. Other: No abdominal wall hernia or abnormality. No abdominopelvic ascites. Musculoskeletal: Bilateral total hip replacements are seen with associated streak artifact and subsequently limited evaluation of the adjacent osseous and soft tissue structures. Chronic fracture deformities are seen involving the bilateral superior and inferior pubic rami. Prior vertebroplasty is seen at the level of S1. Multilevel degenerative changes are noted throughout the lumbar spine. IMPRESSION: 1. Findings consistent with choledocholithiasis. Further evaluation with MRCP is recommended. 2. Colonic diverticulosis. 3. Bilateral total hip replacements with associated streak artifact and subsequently limited evaluation of the adjacent osseous and soft tissue structures. 4. Chronic fracture deformities involving the bilateral superior and inferior pubic rami. 5. Aortic atherosclerosis. Aortic Atherosclerosis (ICD10-I70.0). Electronically Signed   By: Virgina Norfolk M.D.   On: 10/31/2021 22:43     EKG: I independently viewed the EKG done and my findings are as followed: None available at the time of the visit.  Assessment/Plan Present on Admission:  AKI (acute kidney injury) ([place])  Active Problems:   AKI (acute kidney injury) ([place])   AKI on CKD 3A Baseline creatinine appears to be 1.0 GFR 59 Presented with creatinine of 2.89 with GFR 15. Start gentle IV fluid hydration LR 50 cc/h x 1 day. Monitor urine output with strict  I's and O's Avoid nephrotoxic agents, dehydration and hypotension. Obtain renal panel in the morning  Choledocholithiasis seen on CT scan No abdominal pain or tenderness with palpation LFTs normal MRCP recommended by radiology, ordered and pending  Essential hypertension BP is not at goal, elevated Resume home oral antihypertensives IV antihypertensive as needed with parameters Continue to monitor vital signs  GERD Resume home PPI  Situational insomnia Trazodone 25 mg x 1.    DVT prophylaxis: Subcu Lovenox daily  Code Status: Full code  Family Communication: None at bedside  Disposition Plan: Admitted to telemetry medical unit  Consults called: None.  Admission status: Inpatient status.   Status is: Inpatient The patient requires at least 2 midnights for further evaluation and treatment of present condition.   Kayleen Memos MD [facility] Pager [phone]  If 7PM-7AM, please contact night-coverage [email or web address] Password [facility]  10/31/2021, 11:05 PM

## 2021-10-31 NOTE — ED Triage Notes (Signed)
Pt reports she went to her PCP due to not feeling well. Pt reports she received a call today and it started she was in acute renal failure. Pt reports she feel good today. Pt denies any s/s.

## 2021-10-31 NOTE — Assessment & Plan Note (Signed)
Ongoing fatigue, unchanged and with activity.

## 2021-10-31 NOTE — ED Notes (Signed)
Returned from radiology at this time 

## 2021-10-31 NOTE — Patient Instructions (Addendum)
Please go downstairs for labs before you leave today.   Continue wearing compression socks.   Limit sodium in your diet.   Try to get up and walk every hour.   Elevate your legs when you are sitting.   We will be in touch with your lab results.   If you have chest pain, feel like your heart is beating abnormally, have shortness of breath that is not related to activity or any other worrisome symptoms, then call 911 or go to the closest emergency department.

## 2021-10-31 NOTE — ED Provider Notes (Signed)
[facility] EMERGENCY DEPARTMENT Provider Note   CSN: [id number] Arrival date & time: 10/31/21  1901     History  Chief Complaint  Patient presents with   Abnormal Lab    Leah [name] is a 88 y.o. female history of hypertension, arthritis, CKD, here presenting with abnormal kidney function.  Patient states that she has not been feeling well for the last month or so.  She states that her appetite is decreased and she felt that her legs are very heavy.  She denies any leg swelling however.  Denies any abdominal pain or trouble breathing.  She went to see her doctor today.  She had labs drawn that showed creatinine of 2.7 from 1.0 in January.  Patient was sent here for admission.  The history is provided by the patient.      Home Medications Prior to Admission medications   Medication Sig Start Date End Date Taking? Authorizing Provider  acetaminophen (TYLENOL) 650 MG CR tablet Take 1,300 mg by mouth every 8 (eight) hours as needed for pain.    [provider]  Apoaequorin (PREVAGEN) 10 MG CAPS Take 20 mg by mouth daily.    [provider]  Ascorbic Acid (VITAMIN C) 1000 MG tablet Take 1,000 mg by mouth daily.    [provider]  aspirin EC 81 MG tablet Take 81 mg by mouth daily. Swallow whole.    [provider]  CALCIUM-MAGNESIUM-ZINC PO Take 3 tablets by mouth daily.    [provider]  Cholecalciferol (DIALYVITE VITAMIN D 5000) 125 MCG (5000 UT) capsule Take 5,000 Units by mouth daily.    [provider]  denosumab (PROLIA) 60 MG/ML SOLN injection Inject 60 mg into the skin every 6 (six) months. Administer in upper arm, thigh, or abdomen    [provider]  diclofenac (VOLTAREN) 75 MG EC tablet TAKE ONE TABLET BY MOUTH TWICE DAILY 09/16/21   Hoyt Koch, MD  latanoprost (XALATAN) 0.005 % ophthalmic solution Place 1 drop into both eyes at bedtime.    [provider]  mirabegron ER  (MYRBETRIQ) 50 MG TB24 tablet Take 1 tablet (50 mg total) by mouth daily. 09/10/21   Hoyt Koch, MD  pantoprazole (PROTONIX) 40 MG tablet Take 1 tablet (40 mg total) by mouth 2 (two) times daily. 12/31/20   Ladene Artist, MD  timolol (TIMOPTIC) 0.5 % ophthalmic solution Place 1 drop into both eyes daily. 07/08/21   [provider]  vitamin E 400 UNIT capsule Take 400 Units by mouth daily.    [provider]      Allergies    Sulfa antibiotics, Sulfasalazine, Nabumetone, Naproxen sodium, and Tramadol hcl    Review of Systems   Review of Systems  Gastrointestinal:  Positive for nausea.  All other systems reviewed and are negative.  Physical Exam Updated Vital Signs BP (!) 200/94   Pulse 75   Temp 98.7 F (37.1 C) (Oral)   Resp 13   Ht '4\' 11"'$  (1.499 m)   Wt 60.3 kg   SpO2 92%   BMI 26.86 kg/m  Physical Exam Vitals and nursing note reviewed.  Constitutional:      Comments: Chronically ill  HENT:     Head: Normocephalic.     Nose: Nose normal.     Mouth/Throat:     Mouth: Mucous membranes are moist.  Eyes:     Extraocular Movements: Extraocular movements intact.     Pupils: Pupils are  equal, round, and reactive to light.  Cardiovascular:     Rate and Rhythm: Normal rate and regular rhythm.     Pulses: Normal pulses.     Heart sounds: Normal heart sounds.  Pulmonary:     Effort: Pulmonary effort is normal.     Breath sounds: Normal breath sounds.  Abdominal:     General: Abdomen is flat.     Palpations: Abdomen is soft.  Musculoskeletal:        General: Normal range of motion.     Cervical back: Normal range of motion and neck supple.     Comments: Trace edema bilaterally  Skin:    General: Skin is warm.     Capillary Refill: Capillary refill takes less than 2 seconds.  Neurological:     General: No focal deficit present.     Mental Status: She is oriented to person, place, and time.  Psychiatric:        Mood and Affect: Mood normal.         Behavior: Behavior normal.    ED Results / Procedures / Treatments   Labs (all labs ordered are listed, but only abnormal results are displayed) Labs Reviewed  URINALYSIS, ROUTINE W REFLEX MICROSCOPIC - Abnormal; Notable for the following components:      Result Value   APPearance HAZY (*)    Leukocytes,Ua SMALL (*)    Bacteria, UA RARE (*)    All other components within normal limits  BRAIN NATRIURETIC PEPTIDE - Abnormal; Notable for the following components:   B Natriuretic Peptide 206.4 (*)    All other components within normal limits  CBC WITH DIFFERENTIAL/PLATELET - Abnormal; Notable for the following components:   RBC 3.79 (*)    Hemoglobin 11.4 (*)    HCT 34.4 (*)    All other components within normal limits  BASIC METABOLIC PANEL    EKG None  Radiology No results found.  Procedures Procedures    Medications Ordered in ED Medications - No data to display  ED Course/ Medical Decision Making/ A&P                           Medical Decision Making Leah [name] is a 88 y.o. female here with renal failure. Patient appears to have new onset renal failure but the time course is unclear.  Her last creatinine was normal in January.  This likely has been progressive over the last month or so.  Plan to repeat CBC and BMP and will get a CT renal stone and urinalysis.  10:54 PM Creatinine is 2.9.  CT renal stone did not show any hydro-.  At this point hospitalist will admit for renal failure  Problems Addressed: Chronic kidney disease, unspecified CKD stage: acute illness or injury  Amount and/or Complexity of Data Reviewed Labs: ordered. Decision-making details documented in ED Course. Radiology: ordered and independent interpretation performed. Decision-making details documented in ED Course.  Risk Prescription drug management. Decision regarding hospitalization.     Final Clinical Impression(s) / ED Diagnoses Final diagnoses:  None    Rx / DC  Orders ED Discharge Orders     None         Drenda Freeze, MD 10/31/21 2255

## 2021-10-31 NOTE — ED Notes (Signed)
Pt ambulated to restroom with assistance and a cane

## 2021-10-31 NOTE — ED Notes (Signed)
Patient transported to CT 

## 2021-11-01 ENCOUNTER — Inpatient Hospital Stay (HOSPITAL_COMMUNITY): Payer: Medicare PPO

## 2021-11-01 DIAGNOSIS — N179 Acute kidney failure, unspecified: Secondary | ICD-10-CM | POA: Diagnosis not present

## 2021-11-01 LAB — CBC WITH DIFFERENTIAL/PLATELET
Abs Immature Granulocytes: 0.02 10*3/uL (ref 0.00–0.07)
Basophils Absolute: 0.1 10*3/uL (ref 0.0–0.1)
Basophils Relative: 1 %
Eosinophils Absolute: 0 10*3/uL (ref 0.0–0.5)
Eosinophils Relative: 0 %
HCT: 32.2 % — ABNORMAL LOW (ref 36.0–46.0)
Hemoglobin: 10.6 g/dL — ABNORMAL LOW (ref 12.0–15.0)
Immature Granulocytes: 0 %
Lymphocytes Relative: 37 %
Lymphs Abs: 2.2 10*3/uL (ref 0.7–4.0)
MCH: 29.7 pg (ref 26.0–34.0)
MCHC: 32.9 g/dL (ref 30.0–36.0)
MCV: 90.2 fL (ref 80.0–100.0)
Monocytes Absolute: 0.6 10*3/uL (ref 0.1–1.0)
Monocytes Relative: 11 %
Neutro Abs: 3 10*3/uL (ref 1.7–7.7)
Neutrophils Relative %: 51 %
Platelets: 167 10*3/uL (ref 150–400)
RBC: 3.57 MIL/uL — ABNORMAL LOW (ref 3.87–5.11)
RDW: 13.4 % (ref 11.5–15.5)
WBC: 6 10*3/uL (ref 4.0–10.5)
nRBC: 0 % (ref 0.0–0.2)

## 2021-11-01 LAB — COMPREHENSIVE METABOLIC PANEL
ALT: 15 U/L (ref 0–44)
AST: 20 U/L (ref 15–41)
Albumin: 3.1 g/dL — ABNORMAL LOW (ref 3.5–5.0)
Alkaline Phosphatase: 66 U/L (ref 38–126)
Anion gap: 8 (ref 5–15)
BUN: 50 mg/dL — ABNORMAL HIGH (ref 8–23)
CO2: 29 mmol/L (ref 22–32)
Calcium: 11.2 mg/dL — ABNORMAL HIGH (ref 8.9–10.3)
Chloride: 106 mmol/L (ref 98–111)
Creatinine, Ser: 2.74 mg/dL — ABNORMAL HIGH (ref 0.44–1.00)
GFR, Estimated: 16 mL/min — ABNORMAL LOW (ref >60)
Glucose, Bld: 126 mg/dL — ABNORMAL HIGH (ref 70–99)
Potassium: 3.3 mmol/L — ABNORMAL LOW (ref 3.5–5.1)
Sodium: 143 mmol/L (ref 135–145)
Total Bilirubin: 0.7 mg/dL (ref 0.3–1.2)
Total Protein: 5.7 g/dL — ABNORMAL LOW (ref 6.5–8.1)

## 2021-11-01 LAB — SODIUM, URINE, RANDOM: Sodium, Ur: 112 mmol/L

## 2021-11-01 LAB — MAGNESIUM: Magnesium: 1.8 mg/dL (ref 1.7–2.4)

## 2021-11-01 LAB — CREATININE, URINE, RANDOM: Creatinine, Urine: 59.75 mg/dL

## 2021-11-01 LAB — PHOSPHORUS: Phosphorus: 4.1 mg/dL (ref 2.5–4.6)

## 2021-11-01 MED ORDER — GADOBUTROL 1 MMOL/ML IV SOLN
6.0000 mL | Freq: Once | INTRAVENOUS | Status: AC | PRN
Start: 2021-11-01 — End: 2021-11-01
  Administered 2021-11-01: 6 mL via INTRAVENOUS

## 2021-11-01 MED ORDER — POLYETHYLENE GLYCOL 3350 17 G PO PACK
17.0000 g | PACK | Freq: Every day | ORAL | Status: DC | PRN
  Administered 2021-11-02: 17 g via ORAL
  Filled 2021-11-01: qty 1

## 2021-11-01 MED ORDER — POTASSIUM CHLORIDE 10 MEQ/100ML IV SOLN
10.0000 meq | INTRAVENOUS | Status: AC
  Administered 2021-11-01 (×4): 10 meq via INTRAVENOUS
  Filled 2021-11-01 (×3): qty 100

## 2021-11-01 MED ORDER — PANTOPRAZOLE SODIUM 40 MG PO TBEC
40.0000 mg | DELAYED_RELEASE_TABLET | Freq: Two times a day (BID) | ORAL | Status: DC
  Administered 2021-11-01 – 2021-11-08 (×14): 40 mg via ORAL
  Filled 2021-11-01 (×14): qty 1

## 2021-11-01 MED ORDER — ACETAMINOPHEN 325 MG PO TABS: 650.0000 mg | ORAL_TABLET | Freq: Four times a day (QID) | ORAL | Status: DC | PRN

## 2021-11-01 MED ORDER — ONDANSETRON HCL 4 MG/2ML IJ SOLN: 4.0000 mg | Freq: Four times a day (QID) | INTRAMUSCULAR | Status: DC | PRN

## 2021-11-01 MED ORDER — TIMOLOL MALEATE 0.5 % OP SOLN
1.0000 [drp] | Freq: Every day | OPHTHALMIC | Status: DC
Start: 2021-11-01 — End: 2021-11-04
  Administered 2021-11-01 – 2021-11-04 (×4): 1 [drp] via OPHTHALMIC
  Filled 2021-11-01: qty 5

## 2021-11-01 MED ORDER — TRAZODONE HCL 50 MG PO TABS
25.0000 mg | ORAL_TABLET | Freq: Once | ORAL | Status: AC
Start: 2021-11-01 — End: 2021-11-01
  Administered 2021-11-01: 25 mg via ORAL
  Filled 2021-11-01: qty 1

## 2021-11-01 MED ORDER — LACTATED RINGERS IV SOLN: INTRAVENOUS | Status: DC

## 2021-11-01 MED ORDER — ASPIRIN 81 MG PO TBEC
81.0000 mg | DELAYED_RELEASE_TABLET | Freq: Every day | ORAL | Status: DC
  Administered 2021-11-01 – 2021-11-08 (×8): 81 mg via ORAL
  Filled 2021-11-01 (×8): qty 1

## 2021-11-01 MED ORDER — MELATONIN 5 MG PO TABS
5.0000 mg | ORAL_TABLET | Freq: Every evening | ORAL | Status: DC | PRN
  Administered 2021-11-03: 5 mg via ORAL
  Filled 2021-11-01: qty 1

## 2021-11-01 MED ORDER — POTASSIUM CHLORIDE 10 MEQ/100ML IV SOLN
INTRAVENOUS | Status: AC
  Filled 2021-11-01: qty 100

## 2021-11-01 NOTE — Progress Notes (Signed)
Mobility Specialist Criteria Algorithm Info.   11/01/21 1440  Mobility  Activity Ambulated with assistance in hallway  Range of Motion/Exercises Active;All extremities  Level of Assistance Standby assist, set-up cues, supervision of patient - no hands on  Assistive Device Cane  Distance Ambulated (ft) 380 ft  Activity Response Tolerated well   Patient is pleasant and eager to participate in mobility. Was independent for bed mobility and standing from EOB. Ambulated in hallway supervision level with very slow but steady gait. Required standing rest break x1 second to fatigue. Returned to room without complaint or incident. Was left in bed with all needs met, call bell in reach.   11/01/2021 4:11 PM  Leah [name], [place], [place]  [facility]:[phone] Office: [phone]

## 2021-11-01 NOTE — Progress Notes (Signed)
PROGRESS NOTE  Leah [name]  GYI:[id number] DOB: 25-Aug-1929 DOA: 10/31/2021 PCP: Hoyt Koch, MD   Brief Narrative:  Patient is 88 year old female with history of GERD, left bundle branch block who presented to the emergency department after her PCP requested her to go for the evaluation of abnormal labs.  Patient was having poor oral intake, heaviness on her legs at home.  No report of abdominal pain or vomiting, fever or chills or shortness of breath.  On presentation, lab work showed acute kidney injury with creatinine of 2.89, baseline creatinine around 1.  BNP was mildly elevated.  CT of the abdomen did not show any evidence of hydronephrosis but showed choledocholithiasis.  MRCP ordered.  On presentation,she was hypertensive, other vitals were normal.  Assessment & Plan:  Active Problems:   AKI (acute kidney injury) ([place])  AKI on CKD stage IIIa: Baseline creatinine around 1.  Presented with creatinine of 2.8.  He was having poor oral intake at home.  Urine sodium of 112.  Elevated BNP but she doesnot look volume overloaded. Started on gentle IV fluids.  Monitor BMP.  Avoid nephrotoxins.CT of the abdomen did not show any hydronephrosis. We will consult nephrology if no improvement in the kidney function by tomorrow.  Choledocholithiasis: Seen on the CT scan.  Abdomen is benign, no abdominal pain or tenderness.  LFTs normal.  MRCP ordered.  Hypercalcemia: Presented with serum calcium of 12.2.  Improving with IV fluids.  Continue.  Hypertension: Blood pressure elevated.  Continue home medications.  We will titrate the medication as needed.  Continue as needed medication for severe hypertension  GERD: Continue PPI  Insomnia: Continue trazodone  Hypokalemia: Supplement with potassium  Debility/deconditioning/weakness: H/O bilateral total hip replacements. PT/OT consulted            DVT prophylaxis:enoxaparin (LOVENOX) injection 30 mg Start: 11/01/21 1000      Code Status: Full Code  Family Communication: Called and discussed with son on phone on 6/2  Patient status:Inpatient  Patient is from :Home  Anticipated discharge JJ:[facility] vs SNF  Estimated DC date:   Consultants: None  Procedures:None  Antimicrobials:  Anti-infectives (From admission, onward)    None       Subjective: Patient seen and examined at the bedside this morning.  Hemodynamically stable.  Very comfortable, lying in bed.  Denies any complaints today.  No abdomen pain, nausea or vomiting.  Objective: Vitals:   10/31/21 2330 10/31/21 2334 11/01/21 0005 11/01/21 0454  BP: (!) 184/87  (!) 167/111 (!) 169/75  Pulse: 80  78 (!) 102  Resp: '18  18 18  '$ Temp:  97.7 F (36.5 C) 98.1 F (36.7 C) 97.9 F (36.6 C)  TempSrc:  Oral Oral Oral  SpO2: (!) 89%  95% (!) 87%  Weight:    61 kg  Height:    '4\' 11"'$  (1.499 m)    Intake/Output Summary (Last 24 hours) at 11/01/2021 0733 Last data filed at 11/01/2021 0329 Gross per 24 hour  Intake 266.56 ml  Output 375 ml  Net -108.44 ml   Filed Weights   10/31/21 2121 11/01/21 0454  Weight: 60.3 kg 61 kg    Examination:  General exam: Overall comfortable, not in distress HEENT: PERRL Respiratory system:  no wheezes or crackles  Cardiovascular system: S1 & S2 heard, RRR.  Gastrointestinal system: Abdomen is nondistended, soft and nontender. Central nervous system: Alert and oriented Extremities: No edema, no clubbing ,no cyanosis Skin: No rashes, no ulcers,no icterus  Data Reviewed: I have personally reviewed following labs and imaging studies  CBC: Recent Labs  Lab 10/31/21 1017 10/31/21 2115 11/01/21 0302  WBC 5.7 6.6 6.0  NEUTROABS 3.7 3.7 3.0  HGB 11.7* 11.4* 10.6*  HCT 34.9* 34.4* 32.2*  MCV 89.1 90.8 90.2  PLT 188.0 200 151   Basic Metabolic Panel: Recent Labs  Lab 10/31/21 1017 10/31/21 2115 11/01/21 0302  NA 144 141 143  K 4.0 4.2 3.3*  CL 103 105 106  CO2 32 27 29  GLUCOSE 104* 95 126*   BUN 50* 52* 50*  CREATININE 2.72* 2.89* 2.74*  CALCIUM 12.2* 11.7* 11.2*  MG  --   --  1.8  PHOS  --   --  4.1     No results found for this or any previous visit (from the past 240 hour(s)).   Radiology Studies: CT Renal Stone Study  Result Date: 10/31/2021 CLINICAL DATA:  Acute kidney injury. EXAM: CT ABDOMEN AND PELVIS WITHOUT CONTRAST TECHNIQUE: Multidetector CT imaging of the abdomen and pelvis was performed following the standard protocol without IV contrast. RADIATION DOSE REDUCTION: This exam was performed according to the departmental dose-optimization program which includes automated exposure control, adjustment of the mA and/or kV according to patient size and/or use of iterative reconstruction technique. COMPARISON:  None Available. FINDINGS: Lower chest: No acute abnormality. Hepatobiliary: No focal liver abnormality is seen. The gallbladder is moderately distended without evidence of gallbladder wall thickening or pericholecystic inflammation. Multiple ill-defined, approximately 7 mm hyperdense foci are seen within a dilated common bile duct (axial CT images 25, 28 and 31, CT series 3). Pancreas: Unremarkable. No pancreatic ductal dilatation or surrounding inflammatory changes. Spleen: Normal in size without focal abnormality. Adrenals/Urinary Tract: Adrenal glands are unremarkable. Kidneys are normal, without renal calculi, focal lesion, or hydronephrosis. Bladder is unremarkable. Stomach/Bowel: Stomach is within normal limits. Appendix appears normal. No evidence of bowel wall thickening, distention, or inflammatory changes. Noninflamed diverticula are seen throughout the large bowel. Vascular/Lymphatic: Aortic atherosclerosis. No enlarged abdominal or pelvic lymph nodes. Reproductive: Uterus and bilateral adnexa are limited in evaluation secondary to overlying streak artifact. Other: No abdominal wall hernia or abnormality. No abdominopelvic ascites. Musculoskeletal: Bilateral total  hip replacements are seen with associated streak artifact and subsequently limited evaluation of the adjacent osseous and soft tissue structures. Chronic fracture deformities are seen involving the bilateral superior and inferior pubic rami. Prior vertebroplasty is seen at the level of S1. Multilevel degenerative changes are noted throughout the lumbar spine. IMPRESSION: 1. Findings consistent with choledocholithiasis. Further evaluation with MRCP is recommended. 2. Colonic diverticulosis. 3. Bilateral total hip replacements with associated streak artifact and subsequently limited evaluation of the adjacent osseous and soft tissue structures. 4. Chronic fracture deformities involving the bilateral superior and inferior pubic rami. 5. Aortic atherosclerosis. Aortic Atherosclerosis (ICD10-I70.0). Electronically Signed   By: Virgina Norfolk M.D.   On: 10/31/2021 22:43    Scheduled Meds:  aspirin EC  81 mg Oral Daily   enoxaparin (LOVENOX) injection  30 mg Subcutaneous Q24H   pantoprazole  40 mg Oral BID   timolol  1 drop Both Eyes Daily   Continuous Infusions:  lactated ringers 50 mL/hr at 11/01/21 0335     LOS: 1 day   Shelly Coss, MD [facility] P6/07/2021, 7:33 AM

## 2021-11-02 DIAGNOSIS — K8050 Calculus of bile duct without cholangitis or cholecystitis without obstruction: Secondary | ICD-10-CM | POA: Diagnosis not present

## 2021-11-02 DIAGNOSIS — N179 Acute kidney failure, unspecified: Secondary | ICD-10-CM | POA: Diagnosis not present

## 2021-11-02 LAB — BASIC METABOLIC PANEL
Anion gap: 8 (ref 5–15)
BUN: 45 mg/dL — ABNORMAL HIGH (ref 8–23)
CO2: 26 mmol/L (ref 22–32)
Calcium: 10.3 mg/dL (ref 8.9–10.3)
Chloride: 105 mmol/L (ref 98–111)
Creatinine, Ser: 2.26 mg/dL — ABNORMAL HIGH (ref 0.44–1.00)
GFR, Estimated: 20 mL/min — ABNORMAL LOW (ref >60)
Glucose, Bld: 147 mg/dL — ABNORMAL HIGH (ref 70–99)
Potassium: 3.6 mmol/L (ref 3.5–5.1)
Sodium: 139 mmol/L (ref 135–145)

## 2021-11-02 MED ORDER — SODIUM CHLORIDE 0.9 % IV SOLN
2.0000 g | INTRAVENOUS | Status: DC
  Administered 2021-11-02: 2 g via INTRAVENOUS
  Filled 2021-11-02: qty 20

## 2021-11-02 MED ORDER — LABETALOL HCL 5 MG/ML IV SOLN
10.0000 mg | INTRAVENOUS | Status: DC | PRN
  Administered 2021-11-02 – 2021-11-05 (×2): 10 mg via INTRAVENOUS
  Filled 2021-11-02 (×2): qty 4

## 2021-11-02 MED ORDER — AMLODIPINE BESYLATE 10 MG PO TABS
10.0000 mg | ORAL_TABLET | Freq: Every day | ORAL | Status: DC
  Administered 2021-11-02 – 2021-11-08 (×7): 10 mg via ORAL
  Filled 2021-11-02 (×7): qty 1

## 2021-11-02 MED ORDER — METRONIDAZOLE 500 MG/100ML IV SOLN
500.0000 mg | Freq: Two times a day (BID) | INTRAVENOUS | Status: DC
  Administered 2021-11-02: 500 mg via INTRAVENOUS
  Filled 2021-11-02: qty 100

## 2021-11-02 NOTE — Progress Notes (Signed)
PROGRESS NOTE  Leah COLOMBE  ZOX:[id number] DOB: May 26, 1930 DOA: 10/31/2021 PCP: Hoyt Koch, MD   Brief Narrative:  Patient is 88 year old female with history of GERD, left bundle branch block who presented to the emergency department after her PCP requested her to go for the evaluation of abnormal labs.  Patient was having poor oral intake, heaviness on her legs at home.  No report of abdominal pain or vomiting, fever or chills or shortness of breath.  On presentation, lab work showed acute kidney injury with creatinine of 2.89, baseline creatinine around 1.  BNP was mildly elevated.  CT of the abdomen did not show any evidence of hydronephrosis but showed choledocholithiasis.  MRCP confirmed 14 mm stone, cholelithiasis with features of cholecystitis.  GI consulted.  Plan for ERCP on Monday .General surgery will be consulted tomorrow for the consideration of cholecystectomy.  Assessment & Plan:  Active Problems:   AKI (acute kidney injury) ([place])   Choledocholithiasis  AKI on CKD stage IIIa: Baseline creatinine around 1.  Presented with creatinine of 2.8.  He was having poor oral intake at home.  Urine sodium of 112.  Elevated BNP but she doesnot look volume overloaded. Started on gentle IV fluids.  Monitor BMP.  Avoid nephrotoxins.CT of the abdomen did not show any hydronephrosis. Kidney function improved with IV fluids.  Choledocholithiasis/cholelithiasis: MRCP confirmed 14 mm stone, cholelithiasis with features of cholecystitis.  GI consulted.  Plan for ERCP on Monday .General surgery will be consulted tomorrow for the consideration of cholecystectomy.Abdomen is benign, no abdominal pain or tenderness.  LFTs normal.   We will also consult general surgery tomorrow.  Hypercalcemia: Resolved with IV fluids  Hypertension: Blood pressure elevated.  Not taking any medication at home.  Restarted on amlodipine 10 mg daily.  Monitor blood pressure.  Continue current medications for  severe hypertension  GERD: Continue PPI  Insomnia: Continue trazodone  Debility/deconditioning/weakness: H/O bilateral total hip replacements. PT/OT will be consulted if needed.           DVT prophylaxis:enoxaparin (LOVENOX) injection 30 mg Start: 11/01/21 1000     Code Status: Full Code  Family Communication: Called and discussed with son on phone on 6/2  Patient status:Inpatient  Patient is from :Home  Anticipated discharge WJ:[id number]   Estimated DC date: Monday/Tuesday   Consultants: GI Procedures:None  Antimicrobials:  Anti-infectives (From admission, onward)    Start     Dose/Rate Route Frequency Ordered Stop   11/02/21 0900  cefTRIAXone (ROCEPHIN) 2 g in sodium chloride 0.9 % 100 mL IVPB  Status:  Discontinued        2 g 200 mL/hr over 30 Minutes Intravenous Every 24 hours 11/02/21 0814 11/02/21 1409   11/02/21 0830  metroNIDAZOLE (FLAGYL) IVPB 500 mg  Status:  Discontinued        500 mg 100 mL/hr over 60 Minutes Intravenous Every 12 hours 11/02/21 0814 11/02/21 1409       Subjective: Patient seen and examined at bedside this morning.  Hemodynamically stable.  Denies any complaints of abdominal pain, nausea or vomiting.  Looks comfortable  Objective: Vitals:   11/02/21 0527 11/02/21 0925 11/02/21 1118 11/02/21 1235  BP: (!) 179/95 (!) 185/82 (!) 199/92 (!) 150/81  Pulse: 69 72 75 66  Resp: 18 17    Temp: 98.5 F (36.9 C) (!) 97.3 F (36.3 C)    TempSrc: Oral Oral    SpO2: 100% 98% 99% 96%  Weight: 62.5 kg     Height:  Intake/Output Summary (Last 24 hours) at 11/02/2021 1409 Last data filed at 11/02/2021 1100 Gross per 24 hour  Intake 1918.06 ml  Output --  Net 1918.06 ml   Filed Weights   10/31/21 2121 11/01/21 0454 11/02/21 0527  Weight: 60.3 kg 61 kg 62.5 kg    Examination:  General exam: Overall comfortable, not in distress HEENT: PERRL Respiratory system:  no wheezes or crackles  Cardiovascular system: S1 & S2 heard, RRR.   Gastrointestinal system: Abdomen is nondistended, soft and nontender. Central nervous system: Alert and oriented Extremities: No edema, no clubbing ,no cyanosis Skin: No rashes, no ulcers,no icterus     Data Reviewed: I have personally reviewed following labs and imaging studies  CBC: Recent Labs  Lab 10/31/21 1017 10/31/21 2115 11/01/21 0302  WBC 5.7 6.6 6.0  NEUTROABS 3.7 3.7 3.0  HGB 11.7* 11.4* 10.6*  HCT 34.9* 34.4* 32.2*  MCV 89.1 90.8 90.2  PLT 188.0 200 409   Basic Metabolic Panel: Recent Labs  Lab 10/31/21 1017 10/31/21 2115 11/01/21 0302 11/02/21 0238  NA 144 141 143 139  K 4.0 4.2 3.3* 3.6  CL 103 105 106 105  CO2 32 '27 29 26  '$ GLUCOSE 104* 95 126* 147*  BUN 50* 52* 50* 45*  CREATININE 2.72* 2.89* 2.74* 2.26*  CALCIUM 12.2* 11.7* 11.2* 10.3  MG  --   --  1.8  --   PHOS  --   --  4.1  --      No results found for this or any previous visit (from the past 240 hour(s)).   Radiology Studies: MR 3D Recon At Scanner  Result Date: 11/01/2021 CLINICAL DATA:  Choledocholithiasis. EXAM: MRI ABDOMEN WITHOUT AND WITH CONTRAST (INCLUDING MRCP) TECHNIQUE: Multiplanar multisequence MR imaging of the abdomen was performed both before and after the administration of intravenous contrast. Heavily T2-weighted images of the biliary and pancreatic ducts were obtained, and three-dimensional MRCP images were rendered by post processing. CONTRAST:  60m GADAVIST GADOBUTROL 1 MMOL/ML IV SOLN COMPARISON:  CT October 31, 2021. FINDINGS: Lower chest: No acute abnormality. Hepatobiliary: No significant hepatic steatosis. No suspicious hepatic lesion. Cholelithiasis in a distended gallbladder with gallbladder wall thickening. Choledocholithiasis measure up to 14 mm. Dilation of the extrahepatic and central intrahepatic biliary tree with the common bile duct measuring 13 mm in diameter. Subtle peribiliary enhancement. Pancreas: Intrinsic T1 signal of the pancreatic parenchyma is within normal  limits. No pancreatic ductal dilation. Homogeneous postcontrast enhancement of the pancreatic parenchyma. No cystic or solid hyperenhancing pancreatic lesion identified. Spleen:  Within normal limits in size and appearance. Adrenals/Urinary Tract: Bilateral adrenal glands appear normal. No hydronephrosis. No solid enhancing renal mass. Stomach/Bowel: Colonic diverticulosis without findings of acute diverticulitis. No evidence of bowel obstruction. Vascular/Lymphatic: Normal caliber abdominal aorta. The portal, splenic and superior mesenteric veins are patent. No pathologically enlarged abdominal lymph nodes. Other:  No significant abdominal free fluid. Musculoskeletal: S shaped curvature of the thoracolumbar spine with associated degenerative change. No suspicious osseous lesion identified. IMPRESSION: 1. Choledocholithiasis measuring up to 14 mm trauma with dilation of the extra and central intrahepatic biliary tree as well as subtle peribiliary enhancement, suggest clinical correlation for signs/symptoms of ascending cholangitis. 2. Cholelithiasis in a distended gallbladder with gallbladder wall thickening possibly reflecting acute cholecystitis. Electronically Signed   By: JDahlia BailiffM.D.   On: 11/01/2021 11:48   MR ABDOMEN MRCP W WO CONTAST  Result Date: 11/01/2021 CLINICAL DATA:  Choledocholithiasis. EXAM: MRI ABDOMEN WITHOUT AND WITH CONTRAST (INCLUDING  MRCP) TECHNIQUE: Multiplanar multisequence MR imaging of the abdomen was performed both before and after the administration of intravenous contrast. Heavily T2-weighted images of the biliary and pancreatic ducts were obtained, and three-dimensional MRCP images were rendered by post processing. CONTRAST:  14m GADAVIST GADOBUTROL 1 MMOL/ML IV SOLN COMPARISON:  CT October 31, 2021. FINDINGS: Lower chest: No acute abnormality. Hepatobiliary: No significant hepatic steatosis. No suspicious hepatic lesion. Cholelithiasis in a distended gallbladder with gallbladder  wall thickening. Choledocholithiasis measure up to 14 mm. Dilation of the extrahepatic and central intrahepatic biliary tree with the common bile duct measuring 13 mm in diameter. Subtle peribiliary enhancement. Pancreas: Intrinsic T1 signal of the pancreatic parenchyma is within normal limits. No pancreatic ductal dilation. Homogeneous postcontrast enhancement of the pancreatic parenchyma. No cystic or solid hyperenhancing pancreatic lesion identified. Spleen:  Within normal limits in size and appearance. Adrenals/Urinary Tract: Bilateral adrenal glands appear normal. No hydronephrosis. No solid enhancing renal mass. Stomach/Bowel: Colonic diverticulosis without findings of acute diverticulitis. No evidence of bowel obstruction. Vascular/Lymphatic: Normal caliber abdominal aorta. The portal, splenic and superior mesenteric veins are patent. No pathologically enlarged abdominal lymph nodes. Other:  No significant abdominal free fluid. Musculoskeletal: S shaped curvature of the thoracolumbar spine with associated degenerative change. No suspicious osseous lesion identified. IMPRESSION: 1. Choledocholithiasis measuring up to 14 mm trauma with dilation of the extra and central intrahepatic biliary tree as well as subtle peribiliary enhancement, suggest clinical correlation for signs/symptoms of ascending cholangitis. 2. Cholelithiasis in a distended gallbladder with gallbladder wall thickening possibly reflecting acute cholecystitis. Electronically Signed   By: JDahlia BailiffM.D.   On: 11/01/2021 11:48   CT Renal Stone Study  Result Date: 10/31/2021 CLINICAL DATA:  Acute kidney injury. EXAM: CT ABDOMEN AND PELVIS WITHOUT CONTRAST TECHNIQUE: Multidetector CT imaging of the abdomen and pelvis was performed following the standard protocol without IV contrast. RADIATION DOSE REDUCTION: This exam was performed according to the departmental dose-optimization program which includes automated exposure control, adjustment  of the mA and/or kV according to patient size and/or use of iterative reconstruction technique. COMPARISON:  None Available. FINDINGS: Lower chest: No acute abnormality. Hepatobiliary: No focal liver abnormality is seen. The gallbladder is moderately distended without evidence of gallbladder wall thickening or pericholecystic inflammation. Multiple ill-defined, approximately 7 mm hyperdense foci are seen within a dilated common bile duct (axial CT images 25, 28 and 31, CT series 3). Pancreas: Unremarkable. No pancreatic ductal dilatation or surrounding inflammatory changes. Spleen: Normal in size without focal abnormality. Adrenals/Urinary Tract: Adrenal glands are unremarkable. Kidneys are normal, without renal calculi, focal lesion, or hydronephrosis. Bladder is unremarkable. Stomach/Bowel: Stomach is within normal limits. Appendix appears normal. No evidence of bowel wall thickening, distention, or inflammatory changes. Noninflamed diverticula are seen throughout the large bowel. Vascular/Lymphatic: Aortic atherosclerosis. No enlarged abdominal or pelvic lymph nodes. Reproductive: Uterus and bilateral adnexa are limited in evaluation secondary to overlying streak artifact. Other: No abdominal wall hernia or abnormality. No abdominopelvic ascites. Musculoskeletal: Bilateral total hip replacements are seen with associated streak artifact and subsequently limited evaluation of the adjacent osseous and soft tissue structures. Chronic fracture deformities are seen involving the bilateral superior and inferior pubic rami. Prior vertebroplasty is seen at the level of S1. Multilevel degenerative changes are noted throughout the lumbar spine. IMPRESSION: 1. Findings consistent with choledocholithiasis. Further evaluation with MRCP is recommended. 2. Colonic diverticulosis. 3. Bilateral total hip replacements with associated streak artifact and subsequently limited evaluation of the adjacent osseous and soft tissue  structures.  4. Chronic fracture deformities involving the bilateral superior and inferior pubic rami. 5. Aortic atherosclerosis. Aortic Atherosclerosis (ICD10-I70.0). Electronically Signed   By: Virgina Norfolk M.D.   On: 10/31/2021 22:43    Scheduled Meds:  amLODipine  10 mg Oral Daily   aspirin EC  81 mg Oral Daily   enoxaparin (LOVENOX) injection  30 mg Subcutaneous Q24H   pantoprazole  40 mg Oral BID   timolol  1 drop Both Eyes Daily   Continuous Infusions:  lactated ringers Stopped (11/02/21 0929)     LOS: 2 days   Shelly Coss, MD [facility] P6/07/2021, 2:09 PM

## 2021-11-02 NOTE — Consult Note (Signed)
Referring Provider: Dr. Burnadette Pop Primary Care Physician:  Myrlene Broker, MD Primary Gastroenterologist:  Dr. Claudette Head  Reason for Consultation: Choledocholithiasis  HPI: Leah [name] is a 88 y.o. female with a past medical history of LBBB, hyperlipidemia, GERD, esophagitis and a benign esophageal stricture.  She was seen by her PCP 10/30/2021 due to feeling unwell for the past 5 months and laboratory studies were drawn.  She was notified later that day that her laboratory studies showed she was in acute renal failure with a creatinine level of 2.72 (baseline creatinine 1.07) and she was instructed to go to the ED for further evaluation.  She presented to [facility] ED 10/31/2021 for further evaluation.  Labs in the ED showed a creatinine level of 2.72.  BUN 50.  Sodium 144.  Potassium 4.0.  Total bili 0.6.  Alk phos 83.  AST 18.  ALT 16.  Calcium 12.2.  Hemoglobin 11.7 (baseline Hg 12.7 on 06/11/2021).  MCV 89.1.  Platelet 188.  BNP 206.4.  Renal stone CT 6/1 showed normal kidneys but incidentally identified a distended gallbladder with multiple ill-defined hyper densities seen within a dilated common bile duct concerning for choledocholithiasis.  An abdominal MRI/MRCP with and without contrast identified choledocholithiasis measuring up to 14 mm with dilation of the extra and central intrahepatic biliary tree and cholelithiasis with a distended gallbladder and gallbladder wall thickening concerning for acute cholecystitis.  A GI consult was requested for further evaluation regarding choledocholithiasis and ERCP.  She reported feeling down in the dumps, legs were not working right and generally felt unwell for the past 5 months.  She had alternating good and bad days.  No nausea or vomiting.  No fevers.  She described having generalized and vague abdominal discomfort which she stated was obscure and was not severe.  She passes a normal formed bowel movement daily.  No  rectal bleeding or black stools.  No BM for the past 5 days which is unusual for her.  Past history of GERD.  She does not take a PPI at home.  No heartburn or dysphagia.  No chest pain or shortness of breath.  She lost 15 pounds 1 year ago after her husband died, no further weight loss for the past 5 to 6 months.  She lives alone.  She remains afebrile.  WBC 0.0 on 6/2.  Hemoglobin 10.6.  Abdominal MRI MRCP with and without contrast 11/01/2021: 1. Choledocholithiasis measuring up to 14 mm trauma with dilation of the extra and central intrahepatic biliary tree as well as subtle peribiliary enhancement, suggest clinical correlation for signs/symptoms of ascending cholangitis. 2. Cholelithiasis in a distended gallbladder with gallbladder wall thickening possibly reflecting acute cholecystitis.   PAST GI PROCEDURES:  EGD 12/31/2020: LA Grade B reflux esophagitis with no bleeding. - Benign-appearing esophageal stenosis. Dilated. - Erosive gastropathy with no bleeding and no stigmata of recent bleeding. Biopsied. - Erythematous mucosa in the prepyloric region of the stomach. - Medium-sized hiatal hernia. - Normal duodenal bulb and second portion of the duodenum.  EGD 08/06/2018: - LA Grade D reflux esophagitis. - Benign-appearing esophageal stenosis. Dilated. - Medium-sized hiatal hernia. - Normal duodenal bulb and second portion of the duodenum. - No specimens collected.  EGD 07/12/2018: - LA Grade D reflux esophagitis. - Benign-appearing esophageal stenosis. Dilated. - Non-bleeding gastric ulcer with no stigmata of bleeding. Biopsied. - Non-bleeding erosive gastropathy. Biopsied. - Medium-sized hiatal hernia. - Normal duodenal bulb and second portion of the duodenum.  Colonoscopy  02/17/2008: ?  Procedure report not found in care everywhere or epic  Flexible sigmoidoscopy 10/14/2001 Diverticulosis   Past Medical History:  Diagnosis Date   Acute maxillary sinusitis    Allergy     Bundle branch block, unspecified    EKG 4/13 with clearance and note that isnt new block Dr Arnoldo Morale on chart   Carpal tunnel syndrome    Cystocele    Detrusor instability    GERD (gastroesophageal reflux disease)    Glaucoma    Hyperlipidemia    Localized osteoarthrosis not specified whether primary or secondary, lower leg    Osteoporosis 01/2014   T score -2.5 UD forarm, stable at the spine recommend 2 year follow up DEXA   PONV (postoperative nausea and vomiting)    19 yrs ago- states has had general anesthesia since and did well   Unspecified adverse effect of unspecified drug, medicinal and biological substance    Uterine prolapse     Past Surgical History:  Procedure Laterality Date   arthroscopic r knee     CARPAL TUNNEL RELEASE  2011   COLONOSCOPY     DILATION AND CURETTAGE OF UTERUS  1982   EYE SURGERY     bilateral cataract extraction  with IOL   FLEXIBLE SIGMOIDOSCOPY     HYSTEROSCOPY     HYSTEROSCOPY WITH D & C  2005 and 2008   IR SACROPLASTY BILATERAL  06/07/2020   JOINT REPLACEMENT     bilateral total hip arthroplasty   total hip rerplacement bilateral     TOTAL KNEE ARTHROPLASTY  10/13/2011   Procedure: TOTAL KNEE ARTHROPLASTY;  Surgeon: Gearlean Alf, MD;  Location: [facility];  Service: Orthopedics;  Laterality: Right;   UPPER GI ENDOSCOPY  07/2018 and 3/20    Prior to Admission medications   Medication Sig Start Date End Date Taking? Authorizing Provider  acetaminophen (TYLENOL) 650 MG CR tablet Take 1,300 mg by mouth every 8 (eight) hours as needed for pain.   Yes [provider]  Apoaequorin (PREVAGEN PO) Take 20 mg by mouth daily.   Yes [provider]  Ascorbic Acid (VITAMIN C) 1000 MG tablet Take 1,000 mg by mouth daily.   Yes [provider]  aspirin EC 81 MG tablet Take 81 mg by mouth daily. Swallow whole.   Yes [provider]  CALCIUM-MAGNESIUM-ZINC PO Take 3 tablets by mouth daily.   Yes [provider]   Cholecalciferol (DIALYVITE VITAMIN D 5000) 125 MCG (5000 UT) capsule Take 5,000 Units by mouth daily.   Yes [provider]  denosumab (PROLIA) 60 MG/ML SOLN injection Inject 60 mg into the skin every 6 (six) months. Administer in upper arm, thigh, or abdomen   Yes [provider]  diclofenac (VOLTAREN) 75 MG EC tablet TAKE ONE TABLET BY MOUTH TWICE DAILY Patient taking differently: Take 75 mg by mouth 2 (two) times daily. 09/16/21  Yes Hoyt Koch, MD  latanoprost (XALATAN) 0.005 % ophthalmic solution Place 1 drop into both eyes at bedtime.   Yes [provider]  mirabegron ER (MYRBETRIQ) 50 MG TB24 tablet Take 1 tablet (50 mg total) by mouth daily. 09/10/21  Yes Hoyt Koch, MD  pantoprazole (PROTONIX) 40 MG tablet Take 1 tablet (40 mg total) by mouth 2 (two) times daily. 12/31/20  Yes Ladene Artist, MD  timolol (TIMOPTIC) 0.5 % ophthalmic solution Place 1 drop into both eyes 2 (two) times daily. 07/08/21  Yes [provider]  vitamin B-12 (  CYANOCOBALAMIN) 1000 MCG tablet Take 1,000 mcg by mouth daily.   Yes [provider]  vitamin E 400 UNIT capsule Take 400 Units by mouth daily.   Yes [provider]    Current Facility-Administered Medications  Medication Dose Route Frequency Provider Last Rate Last Admin   acetaminophen (TYLENOL) tablet 650 mg  650 mg Oral Q6H PRN Hall, Carole N, DO       amLODipine (NORVASC) tablet 10 mg  10 mg Oral Daily Shelly Coss, MD       aspirin EC tablet 81 mg  81 mg Oral Daily Irene Pap N, DO   81 mg at 11/01/21 1245   cefTRIAXone (ROCEPHIN) 2 g in sodium chloride 0.9 % 100 mL IVPB  2 g Intravenous Q24H Adhikari, Amrit, MD       enoxaparin (LOVENOX) injection 30 mg  30 mg Subcutaneous Q24H Hall, Carole N, DO   30 mg at 11/01/21 1246   labetalol (NORMODYNE) injection 10 mg  10 mg Intravenous Q2H PRN Shelly Coss, MD       lactated ringers infusion   Intravenous Continuous Shelly Coss, MD 100 mL/hr at 11/02/21 0637 New Bag at 11/02/21 2641   melatonin tablet 5 mg  5 mg Oral QHS PRN Irene Pap N, DO       metroNIDAZOLE (FLAGYL) IVPB 500 mg  500 mg Intravenous Q12H Shelly Coss, MD       ondansetron (ZOFRAN) injection 4 mg  4 mg Intravenous Q6H PRN Hall, Carole N, DO       pantoprazole (PROTONIX) EC tablet 40 mg  40 mg Oral BID Irene Pap N, DO   40 mg at 11/01/21 2236   polyethylene glycol (MIRALAX / GLYCOLAX) packet 17 g  17 g Oral Daily PRN Kayleen Memos, DO   17 g at 11/02/21 5830   timolol (TIMOPTIC) 0.5 % ophthalmic solution 1 drop  1 drop Both Eyes Daily Irene Pap N, DO   1 drop at 11/01/21 1245    Allergies as of 10/31/2021 - Review Complete 10/31/2021  Allergen Reaction Noted   Sulfa antibiotics Rash 06/12/2011   Sulfasalazine Hives and Rash 06/12/2011   Nabumetone Other (See Comments) 02/15/2007   Naproxen sodium Hives 02/15/2007   Tramadol hcl Nausea And Vomiting 07/05/2009    Family History  Problem Relation Age of Onset   Dementia Mother    Breast cancer Mother 11   Heart disease Father    Stroke Father    Dementia Sister    Colon cancer Neg Hx    Stomach cancer Neg Hx    Rectal cancer Neg Hx    Esophageal cancer Neg Hx    Colon polyps Neg Hx    Pancreatic cancer Neg Hx     Social History   Socioeconomic History   Marital status: Widowed    Spouse name: Not on file   Number of children: 2   Years of education: Not on file   Highest education level: Not on file  Occupational History   Occupation: retired  Tobacco Use   Smoking status: Former    Types: Cigarettes    Quit date: 10/06/1958    Years since quitting: 63.1   Smokeless tobacco: Never  Vaping Use   Vaping Use: Never used  Substance and Sexual Activity   Alcohol use: Not Currently   Drug use: No   Sexual activity: Not Currently    Birth control/protection: Post-menopausal    Comment: 1st intercourse 31 yo-1 partner  Other Topics Concern   Not on file   Social History Narrative   Not on file   Social Determinants of Health   Financial Resource Strain: Low Risk    Difficulty of Paying Living Expenses: Not hard at all  Food Insecurity: No Food Insecurity   Worried About Charity fundraiser in the Last Year: Never true   [place] in the Last Year: Never true  Transportation Needs: No Transportation Needs   Lack of Transportation (Medical): No   Lack of Transportation (Non-Medical): No  Physical Activity: Sufficiently Active   Days of Exercise per Week: 5 days   Minutes of Exercise per Session: 30 min  Stress: No Stress Concern Present   Feeling of Stress : Not at all  Social Connections: Moderately Integrated   Frequency of Communication with Friends and Family: More than three times a week   Frequency of Social Gatherings with Friends and Family: More than three times a week   Attends Religious Services: More than 4 times per year   Active Member of [employer] or Organizations: Yes   Attends Archivist Meetings: More than 4 times per year   Marital Status: Widowed  Human resources officer Violence: Not At Risk   Fear of Current or Ex-Partner: No   Emotionally Abused: No   Physically Abused: No   Sexually Abused: No    Review of Systems: Gen: Denies fever, sweats or chills. No weight loss.  CV: Denies chest pain, palpitations or edema. Resp: Denies cough, shortness of breath of hemoptysis.  GI: See HPI.  Denies heartburn dysphagia.  No diarrhea or constipation. No rectal bleeding or melena.   GU : Denies urinary burning, blood in urine, increased urinary frequency or incontinence. MS: + Arthritis.  Derm: Denies rash, itchiness, skin lesions or unhealing ulcers. Psych: Denies depression, anxiety memory loss Heme: Denies easy bruising, bleeding. Neuro:  Denies headaches, dizziness or paresthesias. Endo:  Denies any problems with DM, thyroid or adrenal function.  Physical Exam: Vital signs in last 24 hours: Temp:   [97.4 F (36.3 C)-99 F (37.2 C)] 98.5 F (36.9 C) (06/03 0527) Pulse Rate:  [69-76] 69 (06/03 0527) Resp:  [16-19] 18 (06/03 0527) BP: (171-189)/(85-96) 179/95 (06/03 0527) SpO2:  [96 %-100 %] 100 % (06/03 0527) Weight:  [62.5 kg] 62.5 kg (06/03 0527)   General:  Alert no acute distress. Head:  Normocephalic and atraumatic. Eyes:  No scleral icterus. Conjunctiva pink. Ears:  Normal auditory acuity. Nose:  No deformity, discharge or lesions. Mouth:  Dentition intact. No ulcers or lesions.  Neck:  Supple. No lymphadenopathy or thyromegaly.  Lungs: Breath sounds clear throughout Heart: Regular rate and rhythm, no murmurs. Abdomen: Soft, nontender.  Mild lower abdominal distention.  Positive bowel sounds to all 4 quadrants.  No palpable mass. Rectal: Deferred. Musculoskeletal:  Symmetrical without gross deformities.  Pulses:  Normal pulses noted. Extremities:  Without clubbing or edema.   Neurologic:  Alert and  oriented x 4. No focal deficits.  Skin:  Intact without significant lesions or rashes. Psych:  Alert and cooperative. Normal mood and affect.  Intake/Output from previous day: 06/02 0701 - 06/03 0700 In: 1862.1 [P.O.:800; I.V.:675.2; IV Piggyback:386.9] Out: 400 [Urine:400] Intake/Output this shift: No intake/output data recorded.  Lab Results: Recent Labs    10/31/21 1017 10/31/21 2115 11/01/21 0302  WBC 5.7 6.6 6.0  HGB 11.7* 11.4* 10.6*  HCT 34.9* 34.4* 32.2*  PLT 188.0 200 167   BMET Recent Labs  10/31/21 2115 11/01/21 0302 11/02/21 0238  NA 141 143 139  K 4.2 3.3* 3.6  CL 105 106 105  CO2 $Re'27 29 26  'ike$ GLUCOSE 95 126* 147*  BUN 52* 50* 45*  CREATININE 2.89* 2.74* 2.26*  CALCIUM 11.7* 11.2* 10.3   LFT Recent Labs    11/01/21 0302  PROT 5.7*  ALBUMIN 3.1*  AST 20  ALT 15  ALKPHOS 66  BILITOT 0.7   PT/INR No results for input(s): LABPROT, INR in the last 72 hours. Hepatitis Panel No results for input(s): HEPBSAG, HCVAB, HEPAIGM, HEPBIGM  in the last 72 hours.    Studies/Results: MR 3D Recon At Scanner  Result Date: 11/01/2021 CLINICAL DATA:  Choledocholithiasis. EXAM: MRI ABDOMEN WITHOUT AND WITH CONTRAST (INCLUDING MRCP) TECHNIQUE: Multiplanar multisequence MR imaging of the abdomen was performed both before and after the administration of intravenous contrast. Heavily T2-weighted images of the biliary and pancreatic ducts were obtained, and three-dimensional MRCP images were rendered by post processing. CONTRAST:  36mL GADAVIST GADOBUTROL 1 MMOL/ML IV SOLN COMPARISON:  CT October 31, 2021. FINDINGS: Lower chest: No acute abnormality. Hepatobiliary: No significant hepatic steatosis. No suspicious hepatic lesion. Cholelithiasis in a distended gallbladder with gallbladder wall thickening. Choledocholithiasis measure up to 14 mm. Dilation of the extrahepatic and central intrahepatic biliary tree with the common bile duct measuring 13 mm in diameter. Subtle peribiliary enhancement. Pancreas: Intrinsic T1 signal of the pancreatic parenchyma is within normal limits. No pancreatic ductal dilation. Homogeneous postcontrast enhancement of the pancreatic parenchyma. No cystic or solid hyperenhancing pancreatic lesion identified. Spleen:  Within normal limits in size and appearance. Adrenals/Urinary Tract: Bilateral adrenal glands appear normal. No hydronephrosis. No solid enhancing renal mass. Stomach/Bowel: Colonic diverticulosis without findings of acute diverticulitis. No evidence of bowel obstruction. Vascular/Lymphatic: Normal caliber abdominal aorta. The portal, splenic and superior mesenteric veins are patent. No pathologically enlarged abdominal lymph nodes. Other:  No significant abdominal free fluid. Musculoskeletal: S shaped curvature of the thoracolumbar spine with associated degenerative change. No suspicious osseous lesion identified. IMPRESSION: 1. Choledocholithiasis measuring up to 14 mm trauma with dilation of the extra and central  intrahepatic biliary tree as well as subtle peribiliary enhancement, suggest clinical correlation for signs/symptoms of ascending cholangitis. 2. Cholelithiasis in a distended gallbladder with gallbladder wall thickening possibly reflecting acute cholecystitis. Electronically Signed   By: Dahlia Bailiff M.D.   On: 11/01/2021 11:48   MR ABDOMEN MRCP W WO CONTAST  Result Date: 11/01/2021 CLINICAL DATA:  Choledocholithiasis. EXAM: MRI ABDOMEN WITHOUT AND WITH CONTRAST (INCLUDING MRCP) TECHNIQUE: Multiplanar multisequence MR imaging of the abdomen was performed both before and after the administration of intravenous contrast. Heavily T2-weighted images of the biliary and pancreatic ducts were obtained, and three-dimensional MRCP images were rendered by post processing. CONTRAST:  69mL GADAVIST GADOBUTROL 1 MMOL/ML IV SOLN COMPARISON:  CT October 31, 2021. FINDINGS: Lower chest: No acute abnormality. Hepatobiliary: No significant hepatic steatosis. No suspicious hepatic lesion. Cholelithiasis in a distended gallbladder with gallbladder wall thickening. Choledocholithiasis measure up to 14 mm. Dilation of the extrahepatic and central intrahepatic biliary tree with the common bile duct measuring 13 mm in diameter. Subtle peribiliary enhancement. Pancreas: Intrinsic T1 signal of the pancreatic parenchyma is within normal limits. No pancreatic ductal dilation. Homogeneous postcontrast enhancement of the pancreatic parenchyma. No cystic or solid hyperenhancing pancreatic lesion identified. Spleen:  Within normal limits in size and appearance. Adrenals/Urinary Tract: Bilateral adrenal glands appear normal. No hydronephrosis. No solid enhancing renal mass. Stomach/Bowel: Colonic diverticulosis without findings of  acute diverticulitis. No evidence of bowel obstruction. Vascular/Lymphatic: Normal caliber abdominal aorta. The portal, splenic and superior mesenteric veins are patent. No pathologically enlarged abdominal lymph nodes.  Other:  No significant abdominal free fluid. Musculoskeletal: S shaped curvature of the thoracolumbar spine with associated degenerative change. No suspicious osseous lesion identified. IMPRESSION: 1. Choledocholithiasis measuring up to 14 mm trauma with dilation of the extra and central intrahepatic biliary tree as well as subtle peribiliary enhancement, suggest clinical correlation for signs/symptoms of ascending cholangitis. 2. Cholelithiasis in a distended gallbladder with gallbladder wall thickening possibly reflecting acute cholecystitis. Electronically Signed   By: Dahlia Bailiff M.D.   On: 11/01/2021 11:48   CT Renal Stone Study  Result Date: 10/31/2021 CLINICAL DATA:  Acute kidney injury. EXAM: CT ABDOMEN AND PELVIS WITHOUT CONTRAST TECHNIQUE: Multidetector CT imaging of the abdomen and pelvis was performed following the standard protocol without IV contrast. RADIATION DOSE REDUCTION: This exam was performed according to the departmental dose-optimization program which includes automated exposure control, adjustment of the mA and/or kV according to patient size and/or use of iterative reconstruction technique. COMPARISON:  None Available. FINDINGS: Lower chest: No acute abnormality. Hepatobiliary: No focal liver abnormality is seen. The gallbladder is moderately distended without evidence of gallbladder wall thickening or pericholecystic inflammation. Multiple ill-defined, approximately 7 mm hyperdense foci are seen within a dilated common bile duct (axial CT images 25, 28 and 31, CT series 3). Pancreas: Unremarkable. No pancreatic ductal dilatation or surrounding inflammatory changes. Spleen: Normal in size without focal abnormality. Adrenals/Urinary Tract: Adrenal glands are unremarkable. Kidneys are normal, without renal calculi, focal lesion, or hydronephrosis. Bladder is unremarkable. Stomach/Bowel: Stomach is within normal limits. Appendix appears normal. No evidence of bowel wall thickening,  distention, or inflammatory changes. Noninflamed diverticula are seen throughout the large bowel. Vascular/Lymphatic: Aortic atherosclerosis. No enlarged abdominal or pelvic lymph nodes. Reproductive: Uterus and bilateral adnexa are limited in evaluation secondary to overlying streak artifact. Other: No abdominal wall hernia or abnormality. No abdominopelvic ascites. Musculoskeletal: Bilateral total hip replacements are seen with associated streak artifact and subsequently limited evaluation of the adjacent osseous and soft tissue structures. Chronic fracture deformities are seen involving the bilateral superior and inferior pubic rami. Prior vertebroplasty is seen at the level of S1. Multilevel degenerative changes are noted throughout the lumbar spine. IMPRESSION: 1. Findings consistent with choledocholithiasis. Further evaluation with MRCP is recommended. 2. Colonic diverticulosis. 3. Bilateral total hip replacements with associated streak artifact and subsequently limited evaluation of the adjacent osseous and soft tissue structures. 4. Chronic fracture deformities involving the bilateral superior and inferior pubic rami. 5. Aortic atherosclerosis. Aortic Atherosclerosis (ICD10-I70.0). Electronically Signed   By: Virgina Norfolk M.D.   On: 10/31/2021 22:43    IMPRESSION/PLAN:  30) 88 year old female admitted to the hospital for acute renal failure, noncontrast renal CT incidentally identified evidence of gallstones and choledocholithiasis.  Normal LFTs. Abdominal MRI/MRCP identified choledocholithiasis measuring up to 14 mm with dilation of the extra and central intrahepatic biliary tree and cholelithiasis with a distended gallbladder and gallbladder wall thickening concerning for acute cholecystitis.  Asymptomatic.  No leukocytosis.  She remains afebrile.  Blood cultures pending.  On Rocephin and Flagyl IV for possible cholecystitis/at risk for ascending cholangitis.  She is hypertensive with BP  179/95. -Clear liquid diet for now -Continue LR IV at 100 cc/hour -Eventual ERCP when renal status stabilizes. No evidence of biliary obstruction with normal alk phos/LFT levels.  ERCP inpatient vs outpatient.  Await further recommendations per Dr. Hilarie Fredrickson. -Hepatic  panel in am  2) AKI on CKD stage 3a. Cr 2.72 -> 2.89 -> 2.74 -> 2.26  3) Anemia.  Hemoglobin 11.7.  No overt GI bleeding.  4) Hypercalcemia -Management per the hospitalist  5) GERD -? Switch PPI to H2 blocker in setting of AKI  6) Hypertension  7) Constipation -MiraLAX nightly as needed    Noralyn Pick  11/02/2021, 09:51AM

## 2021-11-02 NOTE — Plan of Care (Signed)
  Problem: Clinical Measurements: Goal: Diagnostic test results will improve Outcome: Progressing   Problem: Clinical Measurements: Goal: Respiratory complications will improve Outcome: Progressing   Problem: Clinical Measurements: Goal: Cardiovascular complication will be avoided Outcome: Progressing   Problem: Activity: Goal: Risk for activity intolerance will decrease Outcome: Progressing   Problem: Nutrition: Goal: Adequate nutrition will be maintained Outcome: Progressing   Problem: Elimination: Goal: Will not experience complications related to urinary retention Outcome: Progressing

## 2021-11-03 DIAGNOSIS — K8050 Calculus of bile duct without cholangitis or cholecystitis without obstruction: Secondary | ICD-10-CM

## 2021-11-03 DIAGNOSIS — N179 Acute kidney failure, unspecified: Secondary | ICD-10-CM | POA: Diagnosis not present

## 2021-11-03 LAB — HEPATIC FUNCTION PANEL
ALT: 13 U/L (ref 0–44)
AST: 18 U/L (ref 15–41)
Albumin: 2.7 g/dL — ABNORMAL LOW (ref 3.5–5.0)
Alkaline Phosphatase: 57 U/L (ref 38–126)
Bilirubin, Direct: 0.1 mg/dL (ref 0.0–0.2)
Indirect Bilirubin: 0.1 mg/dL — ABNORMAL LOW (ref 0.3–0.9)
Total Bilirubin: 0.2 mg/dL — ABNORMAL LOW (ref 0.3–1.2)
Total Protein: 5.4 g/dL — ABNORMAL LOW (ref 6.5–8.1)

## 2021-11-03 LAB — BASIC METABOLIC PANEL
Anion gap: 9 (ref 5–15)
BUN: 29 mg/dL — ABNORMAL HIGH (ref 8–23)
CO2: 24 mmol/L (ref 22–32)
Calcium: 9.4 mg/dL (ref 8.9–10.3)
Chloride: 108 mmol/L (ref 98–111)
Creatinine, Ser: 1.6 mg/dL — ABNORMAL HIGH (ref 0.44–1.00)
GFR, Estimated: 31 mL/min — ABNORMAL LOW (ref >60)
Glucose, Bld: 88 mg/dL (ref 70–99)
Potassium: 3.5 mmol/L (ref 3.5–5.1)
Sodium: 141 mmol/L (ref 135–145)

## 2021-11-03 MED ORDER — HYDRALAZINE HCL 25 MG PO TABS
25.0000 mg | ORAL_TABLET | Freq: Three times a day (TID) | ORAL | Status: DC
  Administered 2021-11-03: 25 mg via ORAL
  Filled 2021-11-03 (×2): qty 1

## 2021-11-03 MED ORDER — HYDRALAZINE HCL 50 MG PO TABS
50.0000 mg | ORAL_TABLET | Freq: Three times a day (TID) | ORAL | Status: DC
  Administered 2021-11-03: 50 mg via ORAL
  Filled 2021-11-03: qty 1

## 2021-11-03 MED ORDER — ENOXAPARIN SODIUM 30 MG/0.3ML IJ SOSY
30.0000 mg | PREFILLED_SYRINGE | INTRAMUSCULAR | Status: DC
  Administered 2021-11-05 – 2021-11-07 (×3): 30 mg via SUBCUTANEOUS
  Filled 2021-11-03 (×3): qty 0.3

## 2021-11-03 NOTE — Progress Notes (Signed)
PROGRESS NOTE  ABRIELLA [name]  HFW:[id number] DOB: 06-08-1929 DOA: 10/31/2021 PCP: Hoyt Koch, MD   Brief Narrative:  Patient is 88 year old female with history of GERD, left bundle branch block who presented to the emergency department after her PCP requested her to go for the evaluation of abnormal labs.  Patient was having poor oral intake, heaviness on her legs at home.  No report of abdominal pain or vomiting, fever or chills or shortness of breath.  On presentation, lab work showed acute kidney injury with creatinine of 2.89, baseline creatinine around 1.  BNP was mildly elevated.  CT of the abdomen did not show any evidence of hydronephrosis but showed choledocholithiasis.  MRCP confirmed 14 mm stone, cholelithiasis with features of cholecystitis.  GI consulted.  Plan for ERCP on Monday .General surgery also consulted  for the consideration of cholecystectomy.  Assessment & Plan:  Active Problems:   AKI (acute kidney injury) ([place])   Choledocholithiasis  AKI on CKD stage IIIa: Baseline creatinine around 1.  Presented with creatinine of 2.8.  He was having poor oral intake at home.  Urine sodium of 112.  Elevated BNP but she didnot look volume overloaded. Started on gentle IV fluids.  Monitor BMP.  Avoid nephrotoxins.CT of the abdomen did not show any hydronephrosis. Kidney function improved with IV fluids.  Choledocholithiasis/cholelithiasis: MRCP confirmed 14 mm stone, cholelithiasis with features of cholecystitis.  GI consulted.  Plan for ERCP on Monday .General surgery consulted  for the consideration of cholecystectomy.Abdomen is benign, no abdominal pain or tenderness.  LFTs normal.    Hypercalcemia: Resolved with IV fluids  Hypertension: Blood pressure elevated.  Not taking any medication at home.  Started on amlodipine hydralazine.  Continue to titrate the medication as needed  GERD: Continue PPI  Insomnia: Continue trazodone  Debility/deconditioning/weakness:  H/O bilateral total hip replacements. PT/OT will be consulted after procedure tomorrow           DVT prophylaxis:enoxaparin (LOVENOX) injection 30 mg Start: 11/01/21 1000     Code Status: Full Code  Family Communication: Called and discussed with son on phone on 6/2  Patient status:Inpatient  Patient is from :Home  Anticipated discharge OY:DXAJ   Estimated DC date: Monday/Tuesday   Consultants: GI Procedures:None  Antimicrobials:  Anti-infectives (From admission, onward)    Start     Dose/Rate Route Frequency Ordered Stop   11/02/21 0900  cefTRIAXone (ROCEPHIN) 2 g in sodium chloride 0.9 % 100 mL IVPB  Status:  Discontinued        2 g 200 mL/hr over 30 Minutes Intravenous Every 24 hours 11/02/21 0814 11/02/21 1409   11/02/21 0830  metroNIDAZOLE (FLAGYL) IVPB 500 mg  Status:  Discontinued        500 mg 100 mL/hr over 60 Minutes Intravenous Every 12 hours 11/02/21 0814 11/02/21 1409       Subjective: Patient seen and examined at the bedside this morning.  Hemodynamically stable.  Comfortable without any complaints today.  Denies any abdomen pain, nausea or vomiting Objective: Vitals:   11/02/21 2154 11/03/21 0517 11/03/21 1012 11/03/21 1016  BP: (!) 169/85 (!) 192/78 (!) 185/92 (!) 185/92  Pulse: 68 72  73  Resp: '14 18  17  '$ Temp: 98 F (36.7 C) 97.8 F (36.6 C)  (!) 97.5 F (36.4 C)  TempSrc: Oral Oral  Oral  SpO2: 96% 97%  98%  Weight:      Height:        Intake/Output Summary (Last 24  hours) at 11/03/2021 1124 Last data filed at 11/03/2021 0800 Gross per 24 hour  Intake 600 ml  Output 1100 ml  Net -500 ml   Filed Weights   10/31/21 2121 11/01/21 0454 11/02/21 0527  Weight: 60.3 kg 61 kg 62.5 kg    Examination:  General exam: Overall comfortable, not in distress, pleasant elderly female HEENT: PERRL Respiratory system:  no wheezes or crackles  Cardiovascular system: S1 & S2 heard, RRR.  Gastrointestinal system: Abdomen is nondistended, soft and  nontender. Central nervous system: Alert and oriented Extremities: No edema, no clubbing ,no cyanosis Skin: No rashes, no ulcers,no icterus     Data Reviewed: I have personally reviewed following labs and imaging studies  CBC: Recent Labs  Lab 10/31/21 1017 10/31/21 2115 11/01/21 0302  WBC 5.7 6.6 6.0  NEUTROABS 3.7 3.7 3.0  HGB 11.7* 11.4* 10.6*  HCT 34.9* 34.4* 32.2*  MCV 89.1 90.8 90.2  PLT 188.0 200 568   Basic Metabolic Panel: Recent Labs  Lab 10/31/21 1017 10/31/21 2115 11/01/21 0302 11/02/21 0238 11/03/21 0350  NA 144 141 143 139 141  K 4.0 4.2 3.3* 3.6 3.5  CL 103 105 106 105 108  CO2 32 '27 29 26 24  '$ GLUCOSE 104* 95 126* 147* 88  BUN 50* 52* 50* 45* 29*  CREATININE 2.72* 2.89* 2.74* 2.26* 1.60*  CALCIUM 12.2* 11.7* 11.2* 10.3 9.4  MG  --   --  1.8  --   --   PHOS  --   --  4.1  --   --      Recent Results (from the past 240 hour(s))  Culture, blood (Routine X 2) w Reflex to ID Panel     Status: None (Preliminary result)   Collection Time: 11/02/21  9:48 AM   Specimen: BLOOD RIGHT HAND  Result Value Ref Range Status   Specimen Description BLOOD RIGHT HAND  Final   Special Requests   Final    BOTTLES DRAWN AEROBIC AND ANAEROBIC Blood Culture adequate volume   Culture   Final    NO GROWTH < 24 HOURS Performed at [facility], [address] [place], [place] [address]    Report Status PENDING  Incomplete  Culture, blood (Routine X 2) w Reflex to ID Panel     Status: None (Preliminary result)   Collection Time: 11/02/21  9:48 AM   Specimen: BLOOD LEFT HAND  Result Value Ref Range Status   Specimen Description BLOOD LEFT HAND  Final   Special Requests   Final    BOTTLES DRAWN AEROBIC AND ANAEROBIC Blood Culture adequate volume   Culture   Final    NO GROWTH < 24 HOURS Performed at [facility], [place] [address] [place], [place] [address]    Report Status PENDING  Incomplete     Radiology Studies: No results found.  Scheduled  Meds:  amLODipine  10 mg Oral Daily   aspirin EC  81 mg Oral Daily   enoxaparin (LOVENOX) injection  30 mg Subcutaneous Q24H   hydrALAZINE  25 mg Oral Q8H   pantoprazole  40 mg Oral BID   timolol  1 drop Both Eyes Daily   Continuous Infusions:  lactated ringers 75 mL/hr at 11/03/21 0909     LOS: 3 days   Shelly Coss, MD [facility] P6/08/2021, 11:24 AM

## 2021-11-03 NOTE — Progress Notes (Signed)
Progress Note   Assessment    88 year old female with history of GERD with esophagitis, benign esophageal stricture, hyperlipidemia admitted with vague abdominal discomfort, AKI found to have nonobstructive CBD stone and gallstones without cholecystitis.   Recommendations   CBD stone nonobstructing --plan ERCP with Dr. Rush Landmark tomorrow.  I have spent time discussing the procedure again today with her and yesterday with her son.  The risks include pancreatitis, anesthesia associated cardiopulmonary and neurologic complication, bleeding and infection.  She has been on antibiotics for the last 48 hours.  After thorough discussion she is agreeable and wishes to proceed. --ERCP tomorrow with Dr. Rush Landmark --N.p.o. after midnight --Periprocedural antibiotics though I do not see a need for antibiotics for cholangitis as there is no evidence of this infection --Liver enzymes today very normal --I will check an INR tomorrow --Surgery consult called today for at least consideration of laparoscopic cholecystectomy given the risk of recurrent choledocholithiasis.  I told her that after sphincterotomy future CBD stones may simply pass but this is not 100% guarantee.  2. AKI --improving with hydration    Chief Complaint  Patient feels well today Tolerating diet Reports she feels better than she has in over 3 months.  Describes good and bad days though she has a hard time verbalizing what constitutes a bad day.  General pain not necessarily localizing to the abdomen. Nonbloody bowel movement   Vital signs in last 24 hours: Temp:  [97.5 F (36.4 C)-98.4 F (36.9 C)] 97.5 F (36.4 C) (06/04 1016) Pulse Rate:  [66-75] 73 (06/04 1016) Resp:  [14-18] 17 (06/04 1016) BP: (150-199)/(78-92) 185/92 (06/04 1016) SpO2:  [96 %-99 %] 98 % (06/04 1016) Last BM Date : 11/02/21  General: Alert, well-developed, in NAD Heart:  Regular rate and rhythm; no murmurs Chest: Clear to ascultation  bilaterally Abdomen:  Soft, nontender and nondistended. Normal bowel sounds, without guarding, and without rebound.   Extremities:  Without edema. Neurologic:  Alert and  oriented x4; grossly normal neurologically. Psych:  Alert and cooperative. Normal mood and affect.  Intake/Output from previous day: 06/03 0701 - 06/04 0700 In: 9024 [P.O.:840; I.V.:258.3; IV Piggyback:157.7] Out: 300 [Urine:300] Intake/Output this shift: Total I/O In: -  Out: 800 [Urine:800]  Lab Results: Recent Labs    10/31/21 2115 11/01/21 0302  WBC 6.6 6.0  HGB 11.4* 10.6*  HCT 34.4* 32.2*  PLT 200 167   BMET Recent Labs    11/01/21 0302 11/02/21 0238 11/03/21 0350  NA 143 139 141  K 3.3* 3.6 3.5  CL 106 105 108  CO2 '29 26 24  '$ GLUCOSE 126* 147* 88  BUN 50* 45* 29*  CREATININE 2.74* 2.26* 1.60*  CALCIUM 11.2* 10.3 9.4   LFT Recent Labs    11/03/21 0350  PROT 5.4*  ALBUMIN 2.7*  AST 18  ALT 13  ALKPHOS 57  BILITOT 0.2*  BILIDIR 0.1  IBILI 0.1*   PT/INR No results for input(s): LABPROT, INR in the last 72 hours. Hepatitis Panel No results for input(s): HEPBSAG, HCVAB, HEPAIGM, HEPBIGM in the last 72 hours.  Studies/Results: MR 3D Recon At Scanner  Result Date: 11/01/2021 CLINICAL DATA:  Choledocholithiasis. EXAM: MRI ABDOMEN WITHOUT AND WITH CONTRAST (INCLUDING MRCP) TECHNIQUE: Multiplanar multisequence MR imaging of the abdomen was performed both before and after the administration of intravenous contrast. Heavily T2-weighted images of the biliary and pancreatic ducts were obtained, and three-dimensional MRCP images were rendered by post processing. CONTRAST:  13m GADAVIST GADOBUTROL 1 MMOL/ML IV  SOLN COMPARISON:  CT October 31, 2021. FINDINGS: Lower chest: No acute abnormality. Hepatobiliary: No significant hepatic steatosis. No suspicious hepatic lesion. Cholelithiasis in a distended gallbladder with gallbladder wall thickening. Choledocholithiasis measure up to 14 mm. Dilation of the  extrahepatic and central intrahepatic biliary tree with the common bile duct measuring 13 mm in diameter. Subtle peribiliary enhancement. Pancreas: Intrinsic T1 signal of the pancreatic parenchyma is within normal limits. No pancreatic ductal dilation. Homogeneous postcontrast enhancement of the pancreatic parenchyma. No cystic or solid hyperenhancing pancreatic lesion identified. Spleen:  Within normal limits in size and appearance. Adrenals/Urinary Tract: Bilateral adrenal glands appear normal. No hydronephrosis. No solid enhancing renal mass. Stomach/Bowel: Colonic diverticulosis without findings of acute diverticulitis. No evidence of bowel obstruction. Vascular/Lymphatic: Normal caliber abdominal aorta. The portal, splenic and superior mesenteric veins are patent. No pathologically enlarged abdominal lymph nodes. Other:  No significant abdominal free fluid. Musculoskeletal: S shaped curvature of the thoracolumbar spine with associated degenerative change. No suspicious osseous lesion identified. IMPRESSION: 1. Choledocholithiasis measuring up to 14 mm trauma with dilation of the extra and central intrahepatic biliary tree as well as subtle peribiliary enhancement, suggest clinical correlation for signs/symptoms of ascending cholangitis. 2. Cholelithiasis in a distended gallbladder with gallbladder wall thickening possibly reflecting acute cholecystitis. Electronically Signed   By: Dahlia Bailiff M.D.   On: 11/01/2021 11:48   MR ABDOMEN MRCP W WO CONTAST  Result Date: 11/01/2021 CLINICAL DATA:  Choledocholithiasis. EXAM: MRI ABDOMEN WITHOUT AND WITH CONTRAST (INCLUDING MRCP) TECHNIQUE: Multiplanar multisequence MR imaging of the abdomen was performed both before and after the administration of intravenous contrast. Heavily T2-weighted images of the biliary and pancreatic ducts were obtained, and three-dimensional MRCP images were rendered by post processing. CONTRAST:  71m GADAVIST GADOBUTROL 1 MMOL/ML IV  SOLN COMPARISON:  CT October 31, 2021. FINDINGS: Lower chest: No acute abnormality. Hepatobiliary: No significant hepatic steatosis. No suspicious hepatic lesion. Cholelithiasis in a distended gallbladder with gallbladder wall thickening. Choledocholithiasis measure up to 14 mm. Dilation of the extrahepatic and central intrahepatic biliary tree with the common bile duct measuring 13 mm in diameter. Subtle peribiliary enhancement. Pancreas: Intrinsic T1 signal of the pancreatic parenchyma is within normal limits. No pancreatic ductal dilation. Homogeneous postcontrast enhancement of the pancreatic parenchyma. No cystic or solid hyperenhancing pancreatic lesion identified. Spleen:  Within normal limits in size and appearance. Adrenals/Urinary Tract: Bilateral adrenal glands appear normal. No hydronephrosis. No solid enhancing renal mass. Stomach/Bowel: Colonic diverticulosis without findings of acute diverticulitis. No evidence of bowel obstruction. Vascular/Lymphatic: Normal caliber abdominal aorta. The portal, splenic and superior mesenteric veins are patent. No pathologically enlarged abdominal lymph nodes. Other:  No significant abdominal free fluid. Musculoskeletal: S shaped curvature of the thoracolumbar spine with associated degenerative change. No suspicious osseous lesion identified. IMPRESSION: 1. Choledocholithiasis measuring up to 14 mm trauma with dilation of the extra and central intrahepatic biliary tree as well as subtle peribiliary enhancement, suggest clinical correlation for signs/symptoms of ascending cholangitis. 2. Cholelithiasis in a distended gallbladder with gallbladder wall thickening possibly reflecting acute cholecystitis. Electronically Signed   By: JDahlia BailiffM.D.   On: 11/01/2021 11:48      LOS: 3 days   JJerene Bears MD 11/03/2021, 10:52 AM See [facility], to contact our on call provider

## 2021-11-03 NOTE — Consult Note (Signed)
Reason for Consult:gallstones Referring Provider: Zenovia Jarred, M.D.  Leah [name] is an 88 y.o. female.  HPI: 88 yo female who lost her husband 1 year ago. She notes malaise for multiple months. In the last week she has had some abdominal pain and nausea and was found to have gallstones with concern for choledocholithiasis. Since being in the hospital she feels better and denies abdominal pain.  Past Medical History:  Diagnosis Date   Acute maxillary sinusitis    Allergy    Bundle branch block, unspecified    EKG 4/13 with clearance and note that isnt new block Dr Arnoldo Morale on chart   Carpal tunnel syndrome    Cystocele    Detrusor instability    GERD (gastroesophageal reflux disease)    Glaucoma    Hyperlipidemia    Localized osteoarthrosis not specified whether primary or secondary, lower leg    Osteoporosis 01/2014   T score -2.5 UD forarm, stable at the spine recommend 2 year follow up DEXA   PONV (postoperative nausea and vomiting)    19 yrs ago- states has had general anesthesia since and did well   Unspecified adverse effect of unspecified drug, medicinal and biological substance    Uterine prolapse     Past Surgical History:  Procedure Laterality Date   arthroscopic r knee     CARPAL TUNNEL RELEASE  2011   COLONOSCOPY     DILATION AND CURETTAGE OF UTERUS  1982   EYE SURGERY     bilateral cataract extraction  with IOL   FLEXIBLE SIGMOIDOSCOPY     HYSTEROSCOPY     HYSTEROSCOPY WITH D & C  2005 and 2008   IR SACROPLASTY BILATERAL  06/07/2020   JOINT REPLACEMENT     bilateral total hip arthroplasty   total hip rerplacement bilateral     TOTAL KNEE ARTHROPLASTY  10/13/2011   Procedure: TOTAL KNEE ARTHROPLASTY;  Surgeon: Gearlean Alf, MD;  Location: [facility];  Service: Orthopedics;  Laterality: Right;   UPPER GI ENDOSCOPY  07/2018 and 3/20    Family History  Problem Relation Age of Onset   Dementia Mother    Breast cancer Mother 52   Heart disease Father     Stroke Father    Dementia Sister    Colon cancer Neg Hx    Stomach cancer Neg Hx    Rectal cancer Neg Hx    Esophageal cancer Neg Hx    Colon polyps Neg Hx    Pancreatic cancer Neg Hx     Social History:  reports that she quit smoking about 63 years ago. Her smoking use included cigarettes. She has never used smokeless tobacco. She reports that she does not currently use alcohol. She reports that she does not use drugs.  Allergies:  Allergies  Allergen Reactions   Sulfa Antibiotics Rash   Sulfasalazine Hives and Rash   Nabumetone Other (See Comments)    Feel weird     Naproxen Sodium Hives   Tramadol Hcl Nausea And Vomiting    Medications: I have reviewed the patient's current medications.  Results for orders placed or performed during the hospital encounter of 10/31/21 (from the past 48 hour(s))  Basic metabolic panel     Status: Abnormal   Collection Time: 11/02/21  2:38 AM  Result Value Ref Range   Sodium 139 135 - 145 mmol/L   Potassium 3.6 3.5 - 5.1 mmol/L   Chloride 105 98 - 111 mmol/L   CO2 26  22 - 32 mmol/L   Glucose, Bld 147 (H) 70 - 99 mg/dL    Comment: Glucose reference range applies only to samples taken after fasting for at least 8 hours.   BUN 45 (H) 8 - 23 mg/dL   Creatinine, Ser 2.26 (H) 0.44 - 1.00 mg/dL   Calcium 10.3 8.9 - 10.3 mg/dL   GFR, Estimated 20 (L) >60 mL/min    Comment: (NOTE) Calculated using the CKD-EPI Creatinine Equation (2021)    Anion gap 8 5 - 15    Comment: Performed at [facility] [address], [place], [place] [address]  Culture, blood (Routine X 2) w Reflex to ID Panel     Status: None (Preliminary result)   Collection Time: 11/02/21  9:48 AM   Specimen: BLOOD RIGHT HAND  Result Value Ref Range   Specimen Description BLOOD RIGHT HAND    Special Requests      BOTTLES DRAWN AEROBIC AND ANAEROBIC Blood Culture adequate volume   Culture      NO GROWTH < 24 HOURS Performed at [facility], [place] [address]  [place], [place] [address]    Report Status PENDING   Culture, blood (Routine X 2) w Reflex to ID Panel     Status: None (Preliminary result)   Collection Time: 11/02/21  9:48 AM   Specimen: BLOOD LEFT HAND  Result Value Ref Range   Specimen Description BLOOD LEFT HAND    Special Requests      BOTTLES DRAWN AEROBIC AND ANAEROBIC Blood Culture adequate volume   Culture      NO GROWTH < 24 HOURS Performed at [facility], [place] [address], [place], [place] [address]    Report Status PENDING   Basic metabolic panel     Status: Abnormal   Collection Time: 11/03/21  3:50 AM  Result Value Ref Range   Sodium 141 135 - 145 mmol/L   Potassium 3.5 3.5 - 5.1 mmol/L   Chloride 108 98 - 111 mmol/L   CO2 24 22 - 32 mmol/L   Glucose, Bld 88 70 - 99 mg/dL    Comment: Glucose reference range applies only to samples taken after fasting for at least 8 hours.   BUN 29 (H) 8 - 23 mg/dL   Creatinine, Ser 1.60 (H) 0.44 - 1.00 mg/dL   Calcium 9.4 8.9 - 10.3 mg/dL   GFR, Estimated 31 (L) >60 mL/min    Comment: (NOTE) Calculated using the CKD-EPI Creatinine Equation (2021)    Anion gap 9 5 - 15    Comment: Performed at [facility] [address], [place], [place] [address]  Hepatic function panel     Status: Abnormal   Collection Time: 11/03/21  3:50 AM  Result Value Ref Range   Total Protein 5.4 (L) 6.5 - 8.1 g/dL   Albumin 2.7 (L) 3.5 - 5.0 g/dL   AST 18 15 - 41 U/L   ALT 13 0 - 44 U/L   Alkaline Phosphatase 57 38 - 126 U/L   Total Bilirubin 0.2 (L) 0.3 - 1.2 mg/dL   Bilirubin, Direct 0.1 0.0 - 0.2 mg/dL   Indirect Bilirubin 0.1 (L) 0.3 - 0.9 mg/dL    Comment: Performed at [facility] [address] [place], [place] [address]    No results found.  Review of Systems  Constitutional:  Positive for malaise/fatigue.  HENT: Negative.    Eyes: Negative.   Respiratory: Negative.  Cardiovascular: Negative.   Gastrointestinal:  Positive for abdominal pain and nausea.   Genitourinary: Negative.   Musculoskeletal: Negative.   Skin: Negative.   Neurological: Negative.   Endo/Heme/Allergies: Negative.   Psychiatric/Behavioral: Negative.     PE Blood pressure (!) 185/92, pulse 73, temperature (!) 97.5 F (36.4 C), temperature source Oral, resp. rate 17, height '4\' 11"'$  (1.499 m), weight 62.5 kg, SpO2 98 %. Constitutional: NAD; conversant; no deformities Eyes: Moist conjunctiva; no lid lag; anicteric; PERRL Neck: Trachea midline; no thyromegaly Lungs: Normal respiratory effort; no tactile fremitus CV: RRR; no palpable thrills; no pitting edema GI: Abd soft, nontender; no palpable hepatosplenomegaly MSK: Normal gait; no clubbing/cyanosis Psychiatric: Appropriate affect; alert and oriented x3 Lymphatic: No palpable cervical or axillary lymphadenopathy Skin: No major subcutaneous nodules. Warm and dry   Assessment/Plan: 88 yo female with choledocholithiasis and gallstones. -we discussed option of surgical removal of gallbladder. She was in favor of proceeding. I think she is a reasonable candidate and would benefit from surgery. The team follow along and discuss finalized plans this week.  I reviewed last 24 h vitals and pain scores, last 48 h intake and output, last 24 h labs and trends, and last 24 h imaging results.  This care required high  level of medical decision making.   Leah [name] 11/03/2021, 3:53 PM

## 2021-11-03 NOTE — TOC Initial Note (Signed)
Transition of Care [facility]) - Initial/Assessment Note    Patient Details  Name: Leah [name] MRN: [id number] Date of Birth: 06/21/1929  Transition of Care [facility]) CM/SW Contact:    Verdell Carmine, RN Phone Number: 11/03/2021, 1:12 PM  Clinical Narrative:                  Transition of Care Department [facility]) has reviewed patient and no TOC needs have been identified at this time. We will continue to monitor patient advancement through interdisciplinary progression rounds. If new patient transition needs arise, please place a TOC consult.       Patient Goals and CMS Choice        Expected Discharge Plan and Services                                                Prior Living Arrangements/Services                       Activities of Daily Living Home Assistive Devices/Equipment: Cane (specify quad or straight) ADL Screening (condition at time of admission) Patient's cognitive ability adequate to safely complete daily activities?: Yes Is the patient deaf or have difficulty hearing?: No Does the patient have difficulty seeing, even when wearing glasses/contacts?: No Does the patient have difficulty concentrating, remembering, or making decisions?: Yes Patient able to express need for assistance with ADLs?: Yes Does the patient have difficulty dressing or bathing?: No Independently performs ADLs?: Yes (appropriate for developmental age) Does the patient have difficulty walking or climbing stairs?: No Weakness of Legs: Both Weakness of Arms/Hands: None  Permission Sought/Granted                  Emotional Assessment              Admission diagnosis:  AKI (acute kidney injury) ([place]) [N17.9] Chronic kidney disease, unspecified CKD stage [N18.9] Patient Active Problem List   Diagnosis Date Noted   Choledocholithiasis    Bilateral leg pain 10/31/2021   Pedal edema 10/31/2021   AKI (acute kidney injury) ([place]) 10/31/2021   Urinary  incontinence 09/11/2021   1st degree AV block 09/11/2021   CKD (chronic kidney disease) stage 3, GFR 30-59 ml/min ([place]) 07/31/2020   Chronic low back pain 07/24/2020   Venous insufficiency 07/03/2020   Left hip pain 05/24/2020   Lumbar radiculopathy 05/03/2020   Lumbar spondylosis 09/30/2019   Degeneration of lumbar intervertebral disc 08/17/2019   Itching 08/17/2018   Sacroiliac joint pain 04/14/2018   Elevated blood pressure reading in office without diagnosis of hypertension 01/15/2018   Pain in joint of right shoulder 08/10/2017   Basal cell carcinoma (BCC) of neck 05/21/2017   Nasal congestion 04/09/2017   Sinus pain 04/09/2017   IBS (irritable bowel syndrome) 01/29/2016   Episodic tension-type headache 11/06/2015   Impacted cerumen of right ear 11/06/2015   Routine general medical examination at a health care facility 11/01/2015   Other fatigue 10/04/2014   Left bundle branch block (LBBB) on electrocardiogram 10/04/2014   DJD (degenerative joint disease) 09/09/2013   Dupuytren's contracture of both hands 03/04/2013   GLAUCOMA STAGE UNSPECIFIED 05/16/2010   Allergic rhinitis 07/05/2009   GERD 09/14/2008   Hyperlipidemia with target LDL less than 130 02/18/2007   Osteoporosis 02/15/2007   PCP:  Hoyt Koch, MD Pharmacy:  [facility] [place], [place] [address] Phone: [phone] Fax: [phone]     Social Determinants of Health (SDOH) Interventions    Readmission Risk Interventions     View : No data to display.

## 2021-11-03 NOTE — Progress Notes (Signed)
Per provider ok discontinue telemetry.

## 2021-11-03 NOTE — Plan of Care (Signed)
  Problem: Clinical Measurements: Goal: Ability to maintain clinical measurements within normal limits will improve Outcome: Progressing Goal: Respiratory complications will improve Outcome: Progressing Goal: Cardiovascular complication will be avoided Outcome: Progressing   Problem: Activity: Goal: Risk for activity intolerance will decrease Outcome: Progressing   Problem: Nutrition: Goal: Adequate nutrition will be maintained Outcome: Progressing   Problem: Coping: Goal: Level of anxiety will decrease Outcome: Progressing   Problem: Elimination: Goal: Will not experience complications related to urinary retention Outcome: Progressing

## 2021-11-04 ENCOUNTER — Inpatient Hospital Stay (HOSPITAL_COMMUNITY): Payer: Medicare PPO

## 2021-11-04 DIAGNOSIS — N179 Acute kidney failure, unspecified: Secondary | ICD-10-CM | POA: Diagnosis not present

## 2021-11-04 DIAGNOSIS — K8051 Calculus of bile duct without cholangitis or cholecystitis with obstruction: Secondary | ICD-10-CM

## 2021-11-04 DIAGNOSIS — K8050 Calculus of bile duct without cholangitis or cholecystitis without obstruction: Secondary | ICD-10-CM | POA: Diagnosis not present

## 2021-11-04 DIAGNOSIS — K838 Other specified diseases of biliary tract: Secondary | ICD-10-CM

## 2021-11-04 LAB — CBC WITH DIFFERENTIAL/PLATELET
Abs Immature Granulocytes: 0.02 10*3/uL (ref 0.00–0.07)
Basophils Absolute: 0.1 10*3/uL (ref 0.0–0.1)
Basophils Relative: 1 %
Eosinophils Absolute: 0.1 10*3/uL (ref 0.0–0.5)
Eosinophils Relative: 2 %
HCT: 35 % — ABNORMAL LOW (ref 36.0–46.0)
Hemoglobin: 11.7 g/dL — ABNORMAL LOW (ref 12.0–15.0)
Immature Granulocytes: 0 %
Lymphocytes Relative: 31 %
Lymphs Abs: 1.9 10*3/uL (ref 0.7–4.0)
MCH: 29.8 pg (ref 26.0–34.0)
MCHC: 33.4 g/dL (ref 30.0–36.0)
MCV: 89.3 fL (ref 80.0–100.0)
Monocytes Absolute: 0.5 10*3/uL (ref 0.1–1.0)
Monocytes Relative: 8 %
Neutro Abs: 3.6 10*3/uL (ref 1.7–7.7)
Neutrophils Relative %: 58 %
Platelets: 171 10*3/uL (ref 150–400)
RBC: 3.92 MIL/uL (ref 3.87–5.11)
RDW: 13.2 % (ref 11.5–15.5)
WBC: 6.1 10*3/uL (ref 4.0–10.5)
nRBC: 0 % (ref 0.0–0.2)

## 2021-11-04 LAB — COMPREHENSIVE METABOLIC PANEL
ALT: 15 U/L (ref 0–44)
AST: 19 U/L (ref 15–41)
Albumin: 3.1 g/dL — ABNORMAL LOW (ref 3.5–5.0)
Alkaline Phosphatase: 64 U/L (ref 38–126)
Anion gap: 9 (ref 5–15)
BUN: 31 mg/dL — ABNORMAL HIGH (ref 8–23)
CO2: 25 mmol/L (ref 22–32)
Calcium: 9.9 mg/dL (ref 8.9–10.3)
Chloride: 108 mmol/L (ref 98–111)
Creatinine, Ser: 1.71 mg/dL — ABNORMAL HIGH (ref 0.44–1.00)
GFR, Estimated: 28 mL/min — ABNORMAL LOW (ref >60)
Glucose, Bld: 100 mg/dL — ABNORMAL HIGH (ref 70–99)
Potassium: 3.3 mmol/L — ABNORMAL LOW (ref 3.5–5.1)
Sodium: 142 mmol/L (ref 135–145)
Total Bilirubin: 0.6 mg/dL (ref 0.3–1.2)
Total Protein: 6.1 g/dL — ABNORMAL LOW (ref 6.5–8.1)

## 2021-11-04 LAB — PROTIME-INR
INR: 1 (ref 0.8–1.2)
Prothrombin Time: 12.7 seconds (ref 11.4–15.2)

## 2021-11-04 MED ORDER — SODIUM CHLORIDE 0.9 % IV SOLN: INTRAVENOUS | Status: DC

## 2021-11-04 MED ORDER — LATANOPROST 0.005 % OP SOLN
1.0000 [drp] | Freq: Every day | OPHTHALMIC | Status: DC
  Administered 2021-11-05 – 2021-11-07 (×3): 1 [drp] via OPHTHALMIC
  Filled 2021-11-04 (×2): qty 2.5

## 2021-11-04 MED ORDER — POTASSIUM CHLORIDE 10 MEQ/100ML IV SOLN
10.0000 meq | INTRAVENOUS | Status: AC
  Administered 2021-11-04 (×4): 10 meq via INTRAVENOUS
  Filled 2021-11-04 (×4): qty 100

## 2021-11-04 MED ORDER — TIMOLOL MALEATE 0.5 % OP SOLN
1.0000 [drp] | Freq: Two times a day (BID) | OPHTHALMIC | Status: DC
  Administered 2021-11-04 – 2021-11-08 (×8): 1 [drp] via OPHTHALMIC
  Filled 2021-11-04: qty 5

## 2021-11-04 MED ORDER — HYDRALAZINE HCL 50 MG PO TABS
100.0000 mg | ORAL_TABLET | Freq: Three times a day (TID) | ORAL | Status: DC
  Administered 2021-11-04 – 2021-11-08 (×10): 100 mg via ORAL
  Filled 2021-11-04 (×11): qty 2

## 2021-11-04 NOTE — H&P (View-Only) (Signed)
          Daily Rounding Note  11/04/2021, 1:14 PM  LOS: 4 days   SUBJECTIVE:   Chief complaint:    non-obstructing CBD stone  Patient feels well.  She is super hungry and is so glad to be able to order a diet. Her ERCP is postponed until tmrw AM as Dr. Rush Landmark did not have time to pursue this today. Patient denies abdominal pain, nausea or vomiting  OBJECTIVE:         Vital signs in last 24 hours:    Temp:  [98 F (36.7 C)-98.9 F (37.2 C)] 98.8 F (37.1 C) (06/05 0929) Pulse Rate:  [74-81] 74 (06/05 0929) Resp:  [17-18] 18 (06/05 0532) BP: (150-173)/(83-133) 173/83 (06/05 0929) SpO2:  [95 %-99 %] 99 % (06/05 0929) Weight:  [61.9 kg] 61.9 kg (06/05 0532) Last BM Date : 11/02/21 Filed Weights   11/01/21 0454 11/02/21 0527 11/04/21 0532  Weight: 61 kg 62.5 kg 61.9 kg   General: Looks very well.  Appears younger than stated age.  Alert.  Comfortable. Heart: RRR. Chest: No labored breathing or cough.  Lungs clear bilaterally. Abdomen: Not distended or tender.  Active bowel sounds Extremities: No CCE. Neuro/Psych: Very pleasant, calm, cooperative.  Fully alert and oriented.  Intake/Output from previous day: 06/04 0701 - 06/05 0700 In: 3465.6 [P.O.:600; I.V.:2865.6] Out: 4350 [Urine:4350]  Intake/Output this shift: No intake/output data recorded.  Lab Results: Recent Labs    11/04/21 0101  WBC 6.1  HGB 11.7*  HCT 35.0*  PLT 171   BMET Recent Labs    11/02/21 0238 11/03/21 0350 11/04/21 0101  NA 139 141 142  K 3.6 3.5 3.3*  CL 105 108 108  CO2 '26 24 25  '$ GLUCOSE 147* 88 100*  BUN 45* 29* 31*  CREATININE 2.26* 1.60* 1.71*  CALCIUM 10.3 9.4 9.9   LFT Recent Labs    11/03/21 0350 11/04/21 0101  PROT 5.4* 6.1*  ALBUMIN 2.7* 3.1*  AST 18 19  ALT 13 15  ALKPHOS 57 64  BILITOT 0.2* 0.6  BILIDIR 0.1  --   IBILI 0.1*  --    PT/INR Recent Labs    11/04/21 0101  LABPROT 12.7  INR 1.0    Hepatitis Panel No results for input(s): HEPBSAG, HCVAB, HEPAIGM, HEPBIGM in the last 72 hours.  Studies/Results: No results found.  Scheduled Meds:  amLODipine  10 mg Oral Daily   aspirin EC  81 mg Oral Daily   [START ON 11/05/2021] enoxaparin (LOVENOX) injection  30 mg Subcutaneous Q24H   hydrALAZINE  100 mg Oral Q8H   latanoprost  1 drop Both Eyes QHS   pantoprazole  40 mg Oral BID   timolol  1 drop Both Eyes BID   Continuous Infusions:  sodium chloride 75 mL/hr at 11/04/21 0957   PRN Meds:.acetaminophen, labetalol, melatonin, ondansetron (ZOFRAN) IV, polyethylene glycol   ASSESMENT:    Non-obstructing CBD stone.  Dr Jerilynn Mages will not be able to perform ERCP today so rescheduling for tmrw AM.     AKI.   PLAN     ERCP tmrw AM.  Ok to eat solids today, NPO  after MN.  Ordered abx for preop tmrw.  Did  not order post op  diclofenac or indocin as pt has hx hives from naprosyn.      Leah [name]  11/04/2021, 1:14 PM Phone [phone]

## 2021-11-04 NOTE — Plan of Care (Signed)

## 2021-11-04 NOTE — Progress Notes (Signed)
Subjective: No complaints today.  Very repetitive in questioning  ROS: See above, otherwise other systems negative  Objective: Vital signs in last 24 hours: Temp:  [97.5 F (36.4 C)-98.9 F (37.2 C)] 98 F (36.7 C) (06/05 0532) Pulse Rate:  [73-81] 76 (06/05 0532) Resp:  [17-18] 18 (06/05 0532) BP: (150-185)/(86-133) 150/133 (06/05 0532) SpO2:  [95 %-98 %] 96 % (06/05 0532) Weight:  [61.9 kg] 61.9 kg (06/05 0532) Last BM Date : 11/02/21  Intake/Output from previous day: 06/04 0701 - 06/05 0700 In: 3465.6 [P.O.:600; I.V.:2865.6] Out: 4350 [Urine:4350] Intake/Output this shift: No intake/output data recorded.  PE: Gen: NAD Abd: soft, NT, ND  Lab Results:  Recent Labs    11/04/21 0101  WBC 6.1  HGB 11.7*  HCT 35.0*  PLT 171   BMET Recent Labs    11/03/21 0350 11/04/21 0101  NA 141 142  K 3.5 3.3*  CL 108 108  CO2 24 25  GLUCOSE 88 100*  BUN 29* 31*  CREATININE 1.60* 1.71*  CALCIUM 9.4 9.9   PT/INR Recent Labs    11/04/21 0101  LABPROT 12.7  INR 1.0   CMP     Component Value Date/Time   NA 142 11/04/2021 0101   K 3.3 (L) 11/04/2021 0101   CL 108 11/04/2021 0101   CO2 25 11/04/2021 0101   GLUCOSE 100 (H) 11/04/2021 0101   BUN 31 (H) 11/04/2021 0101   CREATININE 1.71 (H) 11/04/2021 0101   CALCIUM 9.9 11/04/2021 0101   PROT 6.1 (L) 11/04/2021 0101   ALBUMIN 3.1 (L) 11/04/2021 0101   AST 19 11/04/2021 0101   ALT 15 11/04/2021 0101   ALKPHOS 64 11/04/2021 0101   BILITOT 0.6 11/04/2021 0101   GFRNONAA 28 (L) 11/04/2021 0101   GFRAA 71 (L) 10/16/2011 0416   Lipase  No results found for: LIPASE     Studies/Results: No results found.  Anti-infectives: Anti-infectives (From admission, onward)    Start     Dose/Rate Route Frequency Ordered Stop   11/02/21 0900  cefTRIAXone (ROCEPHIN) 2 g in sodium chloride 0.9 % 100 mL IVPB  Status:  Discontinued        2 g 200 mL/hr over 30 Minutes Intravenous Every 24 hours 11/02/21 0814  11/02/21 1409   11/02/21 0830  metroNIDAZOLE (FLAGYL) IVPB 500 mg  Status:  Discontinued        500 mg 100 mL/hr over 60 Minutes Intravenous Every 12 hours 11/02/21 0814 11/02/21 1409        Assessment/Plan Choledocholithiasis -patient for ERCP today -we discussed extensively her 3 options for how to proceed post ERCP.  1. Lap chole  2. Go home and think about it and she could follow up as an outpatient if she would like to proceed with surgery in the future  3. ERCP with sphincterotomy and no surgery (no definitive tx but good tx for her age)   -appropriately, she would like to discuss with her kids -in the process of this discussion, the patient asked me multiple repetitive questions that had just been answered raising some concern for forgetfulness and her ability to recall and make decisions totally on her own. -we will revisit this topic of discussion following ERCP  FEN - NPO/IVFs VTE - Lovenox ID - none currently  HLD ?forgetfulness GERD  I reviewed Consultant GI notes, hospitalist notes, last 24 h vitals and pain scores, last 48 h intake and output, last 24 h labs and trends,  and last 24 h imaging results.   LOS: 4 days    Henreitta Cea , [facility] 11/04/2021, 9:14 AM Please see Amion for pager number during day hours 7:00am-4:30pm or 7:00am -11:30am on weekends

## 2021-11-04 NOTE — Progress Notes (Signed)
Mobility Specialist Criteria Algorithm Info.   11/04/21 1226  Mobility  Activity Turned to back - supine (UE/LE AROM)  Range of Motion/Exercises Active;All extremities  Level of Assistance Standby assist, set-up cues, supervision of patient - no hands on  Assistive Device None  Activity Response Tolerated well; RN notified   Patient received in supine agreeable to participate in mobility. Deferred ambulation second to pain in stiffness in LUE/LLE, also to go for procedure soon. Pt demo x4 bed level UE/LE AROM and x2 LE exercises. Pt limited by pain and stiffness but tolerated without complaint or incident. Was left in bed with all needs met, call bell in reach.   11/04/2021 12:50 PM  [place] Rosalee Tolley, [place], [place]  MBWGY:[phone] Office: [phone]

## 2021-11-04 NOTE — Progress Notes (Signed)
          Daily Rounding Note  11/04/2021, 1:14 PM  LOS: 4 days   SUBJECTIVE:   Chief complaint:    non-obstructing CBD stone  Patient feels well.  She is super hungry and is so glad to be able to order a diet. Her ERCP is postponed until tmrw AM as Dr. Rush Landmark did not have time to pursue this today. Patient denies abdominal pain, nausea or vomiting  OBJECTIVE:         Vital signs in last 24 hours:    Temp:  [98 F (36.7 C)-98.9 F (37.2 C)] 98.8 F (37.1 C) (06/05 0929) Pulse Rate:  [74-81] 74 (06/05 0929) Resp:  [17-18] 18 (06/05 0532) BP: (150-173)/(83-133) 173/83 (06/05 0929) SpO2:  [95 %-99 %] 99 % (06/05 0929) Weight:  [61.9 kg] 61.9 kg (06/05 0532) Last BM Date : 11/02/21 Filed Weights   11/01/21 0454 11/02/21 0527 11/04/21 0532  Weight: 61 kg 62.5 kg 61.9 kg   General: Looks very well.  Appears younger than stated age.  Alert.  Comfortable. Heart: RRR. Chest: No labored breathing or cough.  Lungs clear bilaterally. Abdomen: Not distended or tender.  Active bowel sounds Extremities: No CCE. Neuro/Psych: Very pleasant, calm, cooperative.  Fully alert and oriented.  Intake/Output from previous day: 06/04 0701 - 06/05 0700 In: 3465.6 [P.O.:600; I.V.:2865.6] Out: 4350 [Urine:4350]  Intake/Output this shift: No intake/output data recorded.  Lab Results: Recent Labs    11/04/21 0101  WBC 6.1  HGB 11.7*  HCT 35.0*  PLT 171   BMET Recent Labs    11/02/21 0238 11/03/21 0350 11/04/21 0101  NA 139 141 142  K 3.6 3.5 3.3*  CL 105 108 108  CO2 '26 24 25  '$ GLUCOSE 147* 88 100*  BUN 45* 29* 31*  CREATININE 2.26* 1.60* 1.71*  CALCIUM 10.3 9.4 9.9   LFT Recent Labs    11/03/21 0350 11/04/21 0101  PROT 5.4* 6.1*  ALBUMIN 2.7* 3.1*  AST 18 19  ALT 13 15  ALKPHOS 57 64  BILITOT 0.2* 0.6  BILIDIR 0.1  --   IBILI 0.1*  --    PT/INR Recent Labs    11/04/21 0101  LABPROT 12.7  INR 1.0    Hepatitis Panel No results for input(s): HEPBSAG, HCVAB, HEPAIGM, HEPBIGM in the last 72 hours.  Studies/Results: No results found.  Scheduled Meds:  amLODipine  10 mg Oral Daily   aspirin EC  81 mg Oral Daily   [START ON 11/05/2021] enoxaparin (LOVENOX) injection  30 mg Subcutaneous Q24H   hydrALAZINE  100 mg Oral Q8H   latanoprost  1 drop Both Eyes QHS   pantoprazole  40 mg Oral BID   timolol  1 drop Both Eyes BID   Continuous Infusions:  sodium chloride 75 mL/hr at 11/04/21 0957   PRN Meds:.acetaminophen, labetalol, melatonin, ondansetron (ZOFRAN) IV, polyethylene glycol   ASSESMENT:    Non-obstructing CBD stone.  Dr Jerilynn Mages will not be able to perform ERCP today so rescheduling for tmrw AM.     AKI.   PLAN     ERCP tmrw AM.  Ok to eat solids today, NPO  after MN.  Ordered abx for preop tmrw.  Did  not order post op  diclofenac or indocin as pt has hx hives from naprosyn.      Azucena Freed  11/04/2021, 1:14 PM Phone [phone]

## 2021-11-04 NOTE — Progress Notes (Signed)
PROGRESS NOTE  Leah [name]  HER:[id number] DOB: [birth year] DOA: 10/31/2021 PCP: Leah Koch, MD   Brief Narrative:  Patient is 88 year old female with history of GERD, left bundle branch block who presented to the emergency department after her PCP requested her to go for the evaluation of abnormal labs.  Patient was having poor oral intake, heaviness on her legs at home.  No report of abdominal pain or vomiting, fever or chills or shortness of breath.  On presentation, lab work showed acute kidney injury with creatinine of 2.89, baseline creatinine around 1.  BNP was mildly elevated.  CT of the abdomen did not show any evidence of hydronephrosis but showed choledocholithiasis.  MRCP confirmed 14 mm stone, cholelithiasis with features of cholecystitis.  GI consulted.  Plan for ERCP on Monday .General surgery also consulted  for the consideration of cholecystectomy.  Assessment & Plan:  Active Problems:   AKI (acute kidney injury) ([place])   Choledocholithiasis  AKI on CKD stage IIIa: Baseline creatinine around 1.  Presented with creatinine of 2.8.  He was having poor oral intake at home.  Urine sodium of 112.  Elevated BNP but she didnot look volume overloaded. Started on gentle IV fluids.  Monitor BMP.  Avoid nephrotoxins.CT of the abdomen did not show any hydronephrosis. Kidney function improved with IV fluids.  Choledocholithiasis/cholelithiasis: MRCP confirmed 14 mm stone, cholelithiasis with features of cholecystitis.  GI consulted.  Plan for ERCP  .General surgery consulted  for the consideration of cholecystectomy.Abdomen is benign, no abdominal pain or tenderness.  LFTs normal.    Hypercalcemia: Resolved with IV fluids  Hypertension: Blood pressure elevated.  Not taking any medication at home.  Started on amlodipine ,hydralazine.  We will continue to titrate the medication as needed  GERD: Continue PPI  Insomnia: Continue trazodone  Debility/deconditioning/weakness:  H/O bilateral total hip replacements. PT/OT will be consulted if needed           DVT prophylaxis:enoxaparin (LOVENOX) injection 30 mg Start: 11/05/21 1000     Code Status: Full Code  Family Communication: Called and discussed with son on phone on 6/5  Patient status:Inpatient  Patient is from :Home  Anticipated discharge EH:UDJS   Estimated DC date: 2 to 3 days, needs to wait for recommendation from general surgery about cholecystectomy   Consultants: GI, general surgery Procedures:None  Antimicrobials:  Anti-infectives (From admission, onward)    Start     Dose/Rate Route Frequency Ordered Stop   11/02/21 0900  cefTRIAXone (ROCEPHIN) 2 g in sodium chloride 0.9 % 100 mL IVPB  Status:  Discontinued        2 g 200 mL/hr over 30 Minutes Intravenous Every 24 hours 11/02/21 0814 11/02/21 1409   11/02/21 0830  metroNIDAZOLE (FLAGYL) IVPB 500 mg  Status:  Discontinued        500 mg 100 mL/hr over 60 Minutes Intravenous Every 12 hours 11/02/21 0814 11/02/21 1409       Subjective:  Patient seen and examined at the bedside this morning.  Hemodynamically stable.  Comfortable, lying in bed, talking on phone.  Denies any abdomen pain, nausea or vomiting   Objective: Vitals:   11/03/21 2025 11/04/21 0532 11/04/21 0532 11/04/21 0929  BP: (!) 164/86  (!) 150/133 (!) 173/83  Pulse: 81  76 74  Resp: 18  18   Temp: 98.9 F (37.2 C)  98 F (36.7 C) 98.8 F (37.1 C)  TempSrc: Oral  Oral Oral  SpO2: 95%  96% 99%  Weight:  61.9 kg    Height:        Intake/Output Summary (Last 24 hours) at 11/04/2021 1128 Last data filed at 11/04/2021 0411 Gross per 24 hour  Intake 1843.08 ml  Output 3550 ml  Net -1706.92 ml   Filed Weights   11/01/21 0454 11/02/21 0527 11/04/21 0532  Weight: 61 kg 62.5 kg 61.9 kg    Examination:  General exam: Overall comfortable, not in distress,pleasant elderly female HEENT: PERRL Respiratory system:  no wheezes or crackles  Cardiovascular  system: S1 & S2 heard, RRR.  Gastrointestinal system: Abdomen is nondistended, soft and nontender. Central nervous system: Alert and oriented Extremities: No edema, no clubbing ,no cyanosis Skin: No rashes, no ulcers,no icterus      Data Reviewed: I have personally reviewed following labs and imaging studies  CBC: Recent Labs  Lab 10/31/21 1017 10/31/21 2115 11/01/21 0302 11/04/21 0101  WBC 5.7 6.6 6.0 6.1  NEUTROABS 3.7 3.7 3.0 3.6  HGB 11.7* 11.4* 10.6* 11.7*  HCT 34.9* 34.4* 32.2* 35.0*  MCV 89.1 90.8 90.2 89.3  PLT 188.0 200 167 269   Basic Metabolic Panel: Recent Labs  Lab 10/31/21 2115 11/01/21 0302 11/02/21 0238 11/03/21 0350 11/04/21 0101  NA 141 143 139 141 142  K 4.2 3.3* 3.6 3.5 3.3*  CL 105 106 105 108 108  CO2 '27 29 26 24 25  '$ GLUCOSE 95 126* 147* 88 100*  BUN 52* 50* 45* 29* 31*  CREATININE 2.89* 2.74* 2.26* 1.60* 1.71*  CALCIUM 11.7* 11.2* 10.3 9.4 9.9  MG  --  1.8  --   --   --   PHOS  --  4.1  --   --   --      Recent Results (from the past 240 hour(s))  Culture, blood (Routine X 2) w Reflex to ID Panel     Status: None (Preliminary result)   Collection Time: 11/02/21  9:48 AM   Specimen: BLOOD RIGHT HAND  Result Value Ref Range Status   Specimen Description BLOOD RIGHT HAND  Final   Special Requests   Final    BOTTLES DRAWN AEROBIC AND ANAEROBIC Blood Culture adequate volume   Culture   Final    NO GROWTH 2 DAYS Performed at [facility], [address] [place], [place] [address]    Report Status PENDING  Incomplete  Culture, blood (Routine X 2) w Reflex to ID Panel     Status: None (Preliminary result)   Collection Time: 11/02/21  9:48 AM   Specimen: BLOOD LEFT HAND  Result Value Ref Range Status   Specimen Description BLOOD LEFT HAND  Final   Special Requests   Final    BOTTLES DRAWN AEROBIC AND ANAEROBIC Blood Culture adequate volume   Culture   Final    NO GROWTH 2 DAYS Performed at [facility], [place] [address],  [place], [place] [address]    Report Status PENDING  Incomplete     Radiology Studies: No results found.  Scheduled Meds:  amLODipine  10 mg Oral Daily   aspirin EC  81 mg Oral Daily   [START ON 11/05/2021] enoxaparin (LOVENOX) injection  30 mg Subcutaneous Q24H   hydrALAZINE  50 mg Oral Q8H   latanoprost  1 drop Both Eyes QHS   pantoprazole  40 mg Oral BID   timolol  1 drop Both Eyes BID   Continuous Infusions:  sodium chloride 75 mL/hr at 11/04/21 0957   potassium  chloride 10 mEq (11/04/21 1003)     LOS: 4 days   Shelly Coss, MD [facility] P6/09/2021, 11:28 AM

## 2021-11-04 NOTE — Care Management Important Message (Signed)
Important Message  Patient Details  Name: Leah [name] MRN: [id number] Date of Birth: 04/04/1930   Medicare Important Message Given:  Yes     Ko Bardon [address] 11/04/2021, 3:49 PM

## 2021-11-05 ENCOUNTER — Inpatient Hospital Stay (HOSPITAL_COMMUNITY): Payer: Medicare PPO

## 2021-11-05 ENCOUNTER — Inpatient Hospital Stay (HOSPITAL_COMMUNITY): Payer: Medicare PPO | Admitting: Anesthesiology

## 2021-11-05 ENCOUNTER — Encounter (HOSPITAL_COMMUNITY): Payer: Self-pay | Admitting: Internal Medicine

## 2021-11-05 ENCOUNTER — Encounter (HOSPITAL_COMMUNITY)
Admission: EM | Disposition: A | Discharge status: Home Health | Payer: Self-pay | Source: Home / Self Care | Attending: Internal Medicine

## 2021-11-05 DIAGNOSIS — R7989 Other specified abnormal findings of blood chemistry: Secondary | ICD-10-CM

## 2021-11-05 DIAGNOSIS — K3189 Other diseases of stomach and duodenum: Secondary | ICD-10-CM

## 2021-11-05 DIAGNOSIS — I447 Left bundle-branch block, unspecified: Secondary | ICD-10-CM

## 2021-11-05 DIAGNOSIS — K8051 Calculus of bile duct without cholangitis or cholecystitis with obstruction: Secondary | ICD-10-CM | POA: Diagnosis not present

## 2021-11-05 DIAGNOSIS — K8050 Calculus of bile duct without cholangitis or cholecystitis without obstruction: Secondary | ICD-10-CM | POA: Diagnosis not present

## 2021-11-05 DIAGNOSIS — K449 Diaphragmatic hernia without obstruction or gangrene: Secondary | ICD-10-CM | POA: Diagnosis not present

## 2021-11-05 DIAGNOSIS — K222 Esophageal obstruction: Secondary | ICD-10-CM

## 2021-11-05 DIAGNOSIS — N179 Acute kidney failure, unspecified: Secondary | ICD-10-CM | POA: Diagnosis not present

## 2021-11-05 DIAGNOSIS — Z01818 Encounter for other preprocedural examination: Secondary | ICD-10-CM

## 2021-11-05 DIAGNOSIS — K838 Other specified diseases of biliary tract: Secondary | ICD-10-CM | POA: Diagnosis not present

## 2021-11-05 HISTORY — PX: SPHINCTEROTOMY: SHX5544

## 2021-11-05 HISTORY — PX: BIOPSY: SHX5522

## 2021-11-05 HISTORY — PX: REMOVAL OF STONES: SHX5545

## 2021-11-05 HISTORY — PX: ERCP: SHX5425

## 2021-11-05 HISTORY — PX: BALLOON DILATION: SHX5330

## 2021-11-05 LAB — SURGICAL PCR SCREEN
MRSA, PCR: NEGATIVE
Staphylococcus aureus: NEGATIVE

## 2021-11-05 LAB — BASIC METABOLIC PANEL
Anion gap: 11 (ref 5–15)
BUN: 21 mg/dL (ref 8–23)
CO2: 22 mmol/L (ref 22–32)
Calcium: 9.6 mg/dL (ref 8.9–10.3)
Chloride: 107 mmol/L (ref 98–111)
Creatinine, Ser: 1.33 mg/dL — ABNORMAL HIGH (ref 0.44–1.00)
GFR, Estimated: 38 mL/min — ABNORMAL LOW (ref >60)
Glucose, Bld: 93 mg/dL (ref 70–99)
Potassium: 3.3 mmol/L — ABNORMAL LOW (ref 3.5–5.1)
Sodium: 140 mmol/L (ref 135–145)

## 2021-11-05 LAB — BRAIN NATRIURETIC PEPTIDE: B Natriuretic Peptide: 181.5 pg/mL — ABNORMAL HIGH (ref 0.0–100.0)

## 2021-11-05 SURGERY — ERCP, WITH INTERVENTION IF INDICATED
Anesthesia: General

## 2021-11-05 MED ORDER — INDOMETHACIN 50 MG RE SUPP
RECTAL | Status: DC | PRN
  Administered 2021-11-05: 100 mg via RECTAL

## 2021-11-05 MED ORDER — ONDANSETRON HCL 4 MG/2ML IJ SOLN
INTRAMUSCULAR | Status: DC | PRN
  Administered 2021-11-05: 4 mg via INTRAVENOUS

## 2021-11-05 MED ORDER — PHENYLEPHRINE 80 MCG/ML (10ML) SYRINGE FOR IV PUSH (FOR BLOOD PRESSURE SUPPORT)
PREFILLED_SYRINGE | INTRAVENOUS | Status: DC | PRN
  Administered 2021-11-05 (×4): 160 ug via INTRAVENOUS

## 2021-11-05 MED ORDER — GLUCAGON HCL RDNA (DIAGNOSTIC) 1 MG IJ SOLR
INTRAMUSCULAR | Status: DC | PRN
  Administered 2021-11-05 (×3): .25 mg via INTRAVENOUS

## 2021-11-05 MED ORDER — SUGAMMADEX SODIUM 200 MG/2ML IV SOLN
INTRAVENOUS | Status: DC | PRN
  Administered 2021-11-05: 125 mg via INTRAVENOUS

## 2021-11-05 MED ORDER — SODIUM CHLORIDE 0.9 % IV SOLN
INTRAVENOUS | Status: DC | PRN
  Administered 2021-11-05: 40 mL

## 2021-11-05 MED ORDER — CIPROFLOXACIN IN D5W 400 MG/200ML IV SOLN
INTRAVENOUS | Status: AC
  Filled 2021-11-05: qty 200

## 2021-11-05 MED ORDER — SODIUM CHLORIDE 0.9 % IV SOLN: INTRAVENOUS | Status: DC

## 2021-11-05 MED ORDER — INDOMETHACIN 50 MG RE SUPP
RECTAL | Status: AC
  Filled 2021-11-05: qty 2

## 2021-11-05 MED ORDER — LACTATED RINGERS IV SOLN
INTRAVENOUS | Status: AC | PRN
  Administered 2021-11-05: 20 mL/h via INTRAVENOUS

## 2021-11-05 MED ORDER — PHENYLEPHRINE HCL-NACL 20-0.9 MG/250ML-% IV SOLN
INTRAVENOUS | Status: DC | PRN
  Administered 2021-11-05: 50 ug/min via INTRAVENOUS

## 2021-11-05 MED ORDER — PROPOFOL 10 MG/ML IV BOLUS
INTRAVENOUS | Status: DC | PRN
  Administered 2021-11-05: 170 mg via INTRAVENOUS

## 2021-11-05 MED ORDER — FENTANYL CITRATE (PF) 250 MCG/5ML IJ SOLN
INTRAMUSCULAR | Status: DC | PRN
  Administered 2021-11-05: 50 ug via INTRAVENOUS

## 2021-11-05 MED ORDER — GLUCAGON HCL RDNA (DIAGNOSTIC) 1 MG IJ SOLR
INTRAMUSCULAR | Status: AC
  Filled 2021-11-05: qty 1

## 2021-11-05 MED ORDER — ROCURONIUM BROMIDE 10 MG/ML (PF) SYRINGE
PREFILLED_SYRINGE | INTRAVENOUS | Status: DC | PRN
  Administered 2021-11-05: 50 mg via INTRAVENOUS

## 2021-11-05 MED ORDER — DICLOFENAC SUPPOSITORY 100 MG
RECTAL | Status: AC
  Filled 2021-11-05: qty 1

## 2021-11-05 MED ORDER — CIPROFLOXACIN IN D5W 400 MG/200ML IV SOLN
INTRAVENOUS | Status: DC | PRN
  Administered 2021-11-05: 400 mg via INTRAVENOUS

## 2021-11-05 MED ORDER — DEXAMETHASONE SODIUM PHOSPHATE 10 MG/ML IJ SOLN
INTRAMUSCULAR | Status: DC | PRN
  Administered 2021-11-05: 5 mg via INTRAVENOUS

## 2021-11-05 MED ORDER — LIDOCAINE 2% (20 MG/ML) 5 ML SYRINGE
INTRAMUSCULAR | Status: DC | PRN
  Administered 2021-11-05: 60 mg via INTRAVENOUS

## 2021-11-05 NOTE — Anesthesia Postprocedure Evaluation (Signed)
Anesthesia Post Note  Patient: Leah [name]  Procedure(s) Performed: ENDOSCOPIC RETROGRADE CHOLANGIOPANCREATOGRAPHY (ERCP) BALLOON DILATION SPHINCTEROTOMY REMOVAL OF STONES     Patient location during evaluation: PACU Anesthesia Type: General Level of consciousness: awake Pain management: pain level controlled Vital Signs Assessment: post-procedure vital signs reviewed and stable Cardiovascular status: stable Postop Assessment: no apparent nausea or vomiting Anesthetic complications: no   No notable events documented.  Last Vitals:  Vitals:   11/05/21 1200 11/05/21 1208  BP: (!) 118/56   Pulse: 68 68  Resp: 19 (!) 22  Temp:    SpO2: 96% 98%    Last Pain:  Vitals:   11/05/21 1145  TempSrc:   PainSc: Asleep                 Amire Leazer

## 2021-11-05 NOTE — Progress Notes (Signed)
Mobility Specialist Criteria Algorithm Info.   11/05/21 1559  Mobility  Activity Moved into chair position in bed;Refused mobility (LE exercises;deferred OOB d/t dizziness from anesthesia)  Range of Motion/Exercises Active;All extremities  Level of Assistance Standby assist, set-up cues, supervision of patient - no hands on  Activity Response Tolerated fair;Other (Comment) (Still c/o dizziness)   Patient received in supine agreeable to participate but deferred OOB mobility d/t dizziness from anesthesia. Pt Demo x4 LE exercises while in bed. Stated she will ask nursing staff to get OOB later and try to sit in recliner chair for dinner. Was left in supine with all needs met, call bell in reach.   11/05/2021 4:02 PM  [place] Gwendolyn Mclees, [place], [place]  EZBMZ:[phone] Office: [phone]

## 2021-11-05 NOTE — Consult Note (Addendum)
The patient has been seen in conjunction with Vikki Ports, PAC. All aspects of care have been considered and discussed. The patient has been personally interviewed, examined, and all clinical data has been reviewed.  Reports feeling fatigued for a period of days to weeks before admission. Admitted with generalized abdominal discomfort and found to have choledocholithiasis. We are asked to assess CV status for planned Lap Chole. Known h/o LBBB with previously documented normal LVEF 55% 2016 Need repeat echocardiogram since BNP is mildly elevated. Also, should repeat EKG. If no surprises, she will be cleared for Lap Chole.     Cardiology Consultation:   Patient ID: Leah [name] MRN: [id number]; DOB: Jun 13, 1929  Admit date: 10/31/2021 Date of Consult: 11/05/2021  PCP:  Hoyt Koch, MD   [facility] Providers Cardiologist:  None     Patient Profile:   Leah [name] is a 88 y.o. female with a hx of GERD, venous insufficiency, chronic LBBB, and generative arthritis who is being seen 11/05/2021 for preoperative evaluation at the request of Dr. Tamsen Meek.  History of Present Illness:   Leah [name] is an 88 year old female with above medical history. Per chart review, patient was seen once by Dr. Sallyanne Kuster in 2016. At that time, patient had been referred to cardiology for evaluation of a newly identified heart murmur and chronic left bundle branch block. At that time, patient was not having any symptoms suggestive of structural heart disease. Had an echocardiogram on 11/23/2014 that showed EF 55-60%, normal wall motion, grade I diastolic dysfunction, no significant valvular abnormalities. As patient did not have any structural heart disease, nor concerning cardiac symptoms, she did not follow up with cardiology.   Patient presented to the ED on 6/1 for evaluation of abnormal labs. Reportedly, patient had gone to her PCP because she felt ill, and had poor oral intake at home.  Her PCP contacted her on 6/1 stating that she was in acute renal failure and she needed to be evaluated. On presentation, patient had a creatinine of 2.89. CT abdomen did not show evidence of hydronephrosis, but did show choledocholithiasis. MRCP confirmed the presence of a 14 mm stone, cholelithiasis with features of cholecystitis. GI was consulted. Underwent ERCP on 6/6 with findings of biliary sludge, s/p complete removal by biliary sphincterotomy. Patient is planned to undergo lap cholecystectomy pending cardiac evaluation.    On interview, patient denies having any cardiac history. Has never had a heart attack or heart failure. Aside from being seen by Dr. Sallyanne Kuster once in 2016, she has never been seen by a cardiologist. Patient does not have any chest pain. Does not remember ever having significant chest pain. She does not have sob. Patient is able to move around her home, up and down stairs, and walk around grocery stores without difficulty. She also sees a trainer and she is very active for her age.   Past Medical History:  Diagnosis Date   Acute maxillary sinusitis    Allergy    Bundle branch block, unspecified    EKG 4/13 with clearance and note that isnt new block Dr Arnoldo Morale on chart   Carpal tunnel syndrome    Cystocele    Detrusor instability    GERD (gastroesophageal reflux disease)    Glaucoma    Hyperlipidemia    Localized osteoarthrosis not specified whether primary or secondary, lower leg    Osteoporosis 01/2014   T score -2.5 UD forarm, stable at the spine recommend 2 year follow up  DEXA   PONV (postoperative nausea and vomiting)    19 yrs ago- states has had general anesthesia since and did well   Unspecified adverse effect of unspecified drug, medicinal and biological substance    Uterine prolapse     Past Surgical History:  Procedure Laterality Date   arthroscopic r knee     CARPAL TUNNEL RELEASE  2011   COLONOSCOPY     DILATION AND CURETTAGE OF UTERUS  1982   EYE  SURGERY     bilateral cataract extraction  with IOL   FLEXIBLE SIGMOIDOSCOPY     HYSTEROSCOPY     HYSTEROSCOPY WITH D & C  2005 and 2008   IR SACROPLASTY BILATERAL  06/07/2020   JOINT REPLACEMENT     bilateral total hip arthroplasty   total hip rerplacement bilateral     TOTAL KNEE ARTHROPLASTY  10/13/2011   Procedure: TOTAL KNEE ARTHROPLASTY;  Surgeon: Gearlean Alf, MD;  Location: [facility];  Service: Orthopedics;  Laterality: Right;   UPPER GI ENDOSCOPY  07/2018 and 3/20     Home Medications:  Prior to Admission medications   Medication Sig Start Date End Date Taking? Authorizing Provider  acetaminophen (TYLENOL) 650 MG CR tablet Take 1,300 mg by mouth every 8 (eight) hours as needed for pain.   Yes [provider]  Apoaequorin (PREVAGEN PO) Take 20 mg by mouth daily.   Yes [provider]  Ascorbic Acid (VITAMIN C) 1000 MG tablet Take 1,000 mg by mouth daily.   Yes [provider]  aspirin EC 81 MG tablet Take 81 mg by mouth daily. Swallow whole.   Yes [provider]  CALCIUM-MAGNESIUM-ZINC PO Take 3 tablets by mouth daily.   Yes [provider]  Cholecalciferol (DIALYVITE VITAMIN D 5000) 125 MCG (5000 UT) capsule Take 5,000 Units by mouth daily.   Yes [provider]  denosumab (PROLIA) 60 MG/ML SOLN injection Inject 60 mg into the skin every 6 (six) months. Administer in upper arm, thigh, or abdomen   Yes [provider]  diclofenac (VOLTAREN) 75 MG EC tablet TAKE ONE TABLET BY MOUTH TWICE DAILY Patient taking differently: Take 75 mg by mouth 2 (two) times daily. 09/16/21  Yes Hoyt Koch, MD  latanoprost (XALATAN) 0.005 % ophthalmic solution Place 1 drop into both eyes at bedtime.   Yes [provider]  mirabegron ER (MYRBETRIQ) 50 MG TB24 tablet Take 1 tablet (50 mg total) by mouth daily. 09/10/21  Yes Hoyt Koch, MD  pantoprazole (PROTONIX) 40 MG tablet Take 1 tablet (40 mg total) by mouth  2 (two) times daily. 12/31/20  Yes Ladene Artist, MD  timolol (TIMOPTIC) 0.5 % ophthalmic solution Place 1 drop into both eyes 2 (two) times daily. 07/08/21  Yes [provider]  vitamin B-12 (CYANOCOBALAMIN) 1000 MCG tablet Take 1,000 mcg by mouth daily.   Yes [provider]  vitamin E 400 UNIT capsule Take 400 Units by mouth daily.   Yes [provider]    Inpatient Medications: Scheduled Meds:  amLODipine  10 mg Oral Daily   aspirin EC  81 mg Oral Daily   enoxaparin (LOVENOX) injection  30 mg Subcutaneous Q24H   hydrALAZINE  100 mg Oral Q8H   latanoprost  1 drop Both Eyes QHS   pantoprazole  40 mg Oral BID   timolol  1 drop Both Eyes BID   Continuous Infusions:  PRN Meds: acetaminophen, labetalol, melatonin, ondansetron (ZOFRAN) IV, polyethylene glycol  Allergies:    Allergies  Allergen Reactions   Sulfa Antibiotics Rash   Sulfasalazine Hives and Rash   Nabumetone Other (See Comments)    Feel weird     Naproxen Sodium Hives   Tramadol Hcl Nausea And Vomiting    Social History:   Social History   Socioeconomic History   Marital status: Widowed    Spouse name: Not on file   Number of children: 2   Years of education: Not on file   Highest education level: Not on file  Occupational History   Occupation: retired  Tobacco Use   Smoking status: Former    Types: Cigarettes    Quit date: 10/06/1958    Years since quitting: 63.1   Smokeless tobacco: Never  Vaping Use   Vaping Use: Never used  Substance and Sexual Activity   Alcohol use: Not Currently   Drug use: No   Sexual activity: Not Currently    Birth control/protection: Post-menopausal    Comment: 1st intercourse 29 yo-1 partner  Other Topics Concern   Not on file  Social History Narrative   Not on file   Social Determinants of Health   Financial Resource Strain: Low Risk    Difficulty of Paying Living Expenses: Not hard at all  Food Insecurity: No Food Insecurity   Worried  About Charity fundraiser in the Last Year: Never true   Stella in the Last Year: Never true  Transportation Needs: No Transportation Needs   Lack of Transportation (Medical): No   Lack of Transportation (Non-Medical): No  Physical Activity: Sufficiently Active   Days of Exercise per Week: 5 days   Minutes of Exercise per Session: 30 min  Stress: No Stress Concern Present   Feeling of Stress : Not at all  Social Connections: Moderately Integrated   Frequency of Communication with Friends and Family: More than three times a week   Frequency of Social Gatherings with Friends and Family: More than three times a week   Attends Religious Services: More than 4 times per year   Active Member of [employer] or Organizations: Yes   Attends Archivist Meetings: More than 4 times per year   Marital Status: Widowed  Human resources officer Violence: Not At Risk   Fear of Current or Ex-Partner: No   Emotionally Abused: No   Physically Abused: No   Sexually Abused: No    Family History:    Family History  Problem Relation Age of Onset   Dementia Mother    Breast cancer Mother 20   Heart disease Father    Stroke Father    Dementia Sister    Colon cancer Neg Hx    Stomach cancer Neg Hx    Rectal cancer Neg Hx    Esophageal cancer Neg Hx    Colon polyps Neg Hx    Pancreatic cancer Neg Hx      ROS:  Please see the history of present illness.   All other ROS reviewed and negative.     Physical Exam/Data:   Vitals:   11/05/21 1215 11/05/21 1230 11/05/21 1245 11/05/21 1249  BP: (!) 119/58 (!) 117/58 128/61   Pulse: 68 69 69 71  Resp: '18 17 18 18  '$ Temp:    98.5 F (36.9 C)  TempSrc:      SpO2: 96% 98% 98% 95%  Weight:      Height:        Intake/Output Summary (Last 24 hours)  at 11/05/2021 1517 Last data filed at 11/05/2021 1255 Gross per 24 hour  Intake 2509.97 ml  Output 1000 ml  Net 1509.97 ml      11/05/2021    6:21 AM 11/04/2021    5:32 AM 11/02/2021    5:27 AM  Last  3 Weights  Weight (lbs) 130 lb 1.1 oz 136 lb 7.4 oz 137 lb 12.6 oz  Weight (kg) 59 kg 61.9 kg 62.5 kg     Body mass index is 26.27 kg/m.  General:  Frail, elderly female in no acute distress  HEENT: normal Neck: no JVD Vascular: Radial pulses 2+ bilaterally Cardiac:  normal S1, S2; RRR; no murmur  Lungs:  clear to auscultation bilaterally, no wheezing, rhonchi or rales  Abd: soft, nontender, no hepatomegaly  Ext: no edema Musculoskeletal:  No deformities, BUE and BLE strength normal and equal Skin: warm and dry  Neuro:  CNs 2-12 intact, no focal abnormalities noted Psych:  Normal affect   EKG:  The EKG was personally reviewed and demonstrates:  NA  Telemetry:  Telemetry was personally reviewed and demonstrates:  NA   Relevant CV Studies:   Laboratory Data:  High Sensitivity Troponin:  No results for input(s): TROPONINIHS in the last 720 hours.   Chemistry Recent Labs  Lab 11/01/21 0302 11/02/21 0238 11/03/21 0350 11/04/21 0101 11/05/21 0442  NA 143   < > 141 142 140  K 3.3*   < > 3.5 3.3* 3.3*  CL 106   < > 108 108 107  CO2 29   < > '24 25 22  '$ GLUCOSE 126*   < > 88 100* 93  BUN 50*   < > 29* 31* 21  CREATININE 2.74*   < > 1.60* 1.71* 1.33*  CALCIUM 11.2*   < > 9.4 9.9 9.6  MG 1.8  --   --   --   --   GFRNONAA 16*   < > 31* 28* 38*  ANIONGAP 8   < > '9 9 11   '$ < > = values in this interval not displayed.    Recent Labs  Lab 11/01/21 0302 11/03/21 0350 11/04/21 0101  PROT 5.7* 5.4* 6.1*  ALBUMIN 3.1* 2.7* 3.1*  AST '20 18 19  '$ ALT '15 13 15  '$ ALKPHOS 66 57 64  BILITOT 0.7 0.2* 0.6   Lipids No results for input(s): CHOL, TRIG, HDL, LABVLDL, LDLCALC, CHOLHDL in the last 168 hours.  Hematology Recent Labs  Lab 10/31/21 2115 11/01/21 0302 11/04/21 0101  WBC 6.6 6.0 6.1  RBC 3.79* 3.57* 3.92  HGB 11.4* 10.6* 11.7*  HCT 34.4* 32.2* 35.0*  MCV 90.8 90.2 89.3  MCH 30.1 29.7 29.8  MCHC 33.1 32.9 33.4  RDW 13.3 13.4 13.2  PLT 200 167 171   Thyroid  Recent  Labs  Lab 10/31/21 1017  TSH 4.07  FREET4 1.15    BNP Recent Labs  Lab 10/31/21 1017 10/31/21 2011  BNP  --  206.4*  PROBNP 145.0*  --     DDimer No results for input(s): DDIMER in the last 168 hours.   Radiology/Studies:  DG ERCP  Result Date: 11/05/2021 CLINICAL DATA:  Choledocholithiasis EXAM: ERCP TECHNIQUE: Multiple spot images obtained with the fluoroscopic device and submitted for interpretation post-procedure. FLUOROSCOPY: Radiation Exposure Index (as provided by the fluoroscopic device): 57.76 mGy Kerma COMPARISON:  MRCP 11/01/2021 FINDINGS: A total of 8 train intraoperative saved images are submitted for review. The images demonstrate a flexible duodenal scope in the  descending duodenum with wire cannulation of the common bile duct. Cholangiogram demonstrates multiple faceted filling defects within the mid and distal common bile duct consistent with choledocholithiasis. Subsequent images confirm balloon sphincterotomy followed by balloon sweeping of the common duct. IMPRESSION: 1. Choledocholithiasis. 2. Balloon sphincterotomy and balloon sweeping of the common duct. These images were submitted for radiologic interpretation only. Please see the procedural report for the amount of contrast and the fluoroscopy time utilized. Electronically Signed   By: Jacqulynn Cadet M.D.   On: 11/05/2021 11:58     Assessment and Plan:   Preoperative evaluation  - Patient was seen by cardiology once in 2016 for evaluation of a new heart murmur, chronic LBBB. Echocardiogram at that time showed EF 73-22%, grade I diastolic dysfunction, no significant valvular abnormalities.  - Does not have history of heart attack, stroke, cardiomyopathy  - Denies any chest pain, sob, dizziness, palpitations. She is very active for her age and meets with a Physiological scientist. She can walk up and down the stairs of her home and take herself to the grocery store. Her activity is at least 4 mets, but likely higher.  -  She just started BP medications this admission. Denies history of diabetes or HLD.   - Ordered echocardiogram to reevaluate EF. If EF is preserved, it is unlikely that she will need any further cardiac workup prior to surgery   LBBB - Patient has a chronic LBBB. Denies any syncope, palpitations, dizziness. Given normal Echo in 2016, suspect that this is an isolated electrical conduction abnormality    Risk Assessment/Risk Scores:         For questions or updates, please contact [facility] Please consult [email or web address] for contact info under    Signed, Margie Billet, PA-C  11/05/2021 3:17 PM

## 2021-11-05 NOTE — Progress Notes (Signed)
Day of Surgery  Subjective: No complaints today. Denies abdominal pain. States she would have surgery to remove gallbladder if she is deemed medically clear for surgery.  ROS: See above, otherwise other systems negative  Objective: Vital signs in last 24 hours: Temp:  [97.7 F (36.5 C)-99.3 F (37.4 C)] 97.7 F (36.5 C) (06/06 0824) Pulse Rate:  [73-95] 79 (06/06 0824) Resp:  [17-18] 18 (06/06 0824) BP: (147-180)/(73-91) 159/75 (06/06 0824) SpO2:  [97 %-99 %] 98 % (06/06 0824) Weight:  [59 kg] 59 kg (06/06 0621) Last BM Date : 11/04/21  Intake/Output from previous day: 06/05 0701 - 06/06 0700 In: 3525 [P.O.:1705; I.V.:1520; IV Piggyback:300] Out: 2000 [Urine:2000] Intake/Output this shift: No intake/output data recorded.  PE: Gen: NAD Abd: soft, NT, ND  Lab Results:  Recent Labs    11/04/21 0101  WBC 6.1  HGB 11.7*  HCT 35.0*  PLT 171   BMET Recent Labs    11/04/21 0101 11/05/21 0442  NA 142 140  K 3.3* 3.3*  CL 108 107  CO2 25 22  GLUCOSE 100* 93  BUN 31* 21  CREATININE 1.71* 1.33*  CALCIUM 9.9 9.6   PT/INR Recent Labs    11/04/21 0101  LABPROT 12.7  INR 1.0   CMP     Component Value Date/Time   NA 140 11/05/2021 0442   K 3.3 (L) 11/05/2021 0442   CL 107 11/05/2021 0442   CO2 22 11/05/2021 0442   GLUCOSE 93 11/05/2021 0442   BUN 21 11/05/2021 0442   CREATININE 1.33 (H) 11/05/2021 0442   CALCIUM 9.6 11/05/2021 0442   PROT 6.1 (L) 11/04/2021 0101   ALBUMIN 3.1 (L) 11/04/2021 0101   AST 19 11/04/2021 0101   ALT 15 11/04/2021 0101   ALKPHOS 64 11/04/2021 0101   BILITOT 0.6 11/04/2021 0101   GFRNONAA 38 (L) 11/05/2021 0442   GFRAA 71 (L) 10/16/2011 0416   Lipase  No results found for: LIPASE     Studies/Results: No results found.  Anti-infectives: Anti-infectives (From admission, onward)    Start     Dose/Rate Route Frequency Ordered Stop   11/02/21 0900  cefTRIAXone (ROCEPHIN) 2 g in sodium chloride 0.9 % 100 mL IVPB   Status:  Discontinued        2 g 200 mL/hr over 30 Minutes Intravenous Every 24 hours 11/02/21 0814 11/02/21 1409   11/02/21 0830  metroNIDAZOLE (FLAGYL) IVPB 500 mg  Status:  Discontinued        500 mg 100 mL/hr over 60 Minutes Intravenous Every 12 hours 11/02/21 0814 11/02/21 1409        Assessment/Plan Choledocholithiasis -patient for ERCP today -we discussed extensively her 3 options for how to proceed post ERCP.  1. Lap chole  2. Go home and think about it and she could follow up as an outpatient if she would like to proceed with surgery in the future  3. ERCP with sphincterotomy and no surgery (no definitive tx but good tx for her age)    -she is interested in proceeding with surgery if medically clear; we have asked her primary service Dr. Tawanna Solo to assess for medical clearance for surgery, if he feels necessary for cardiology consult to please obtain.  Noted plans for ERCP later today  FEN - per primary; if cleared for surgery, npo after midnight tonight please VTE - Lovenox ID - none currently  HLD ?forgetfulness GERD  I called and spoke over the phone with her son Legrand Como  today. We have also reviewed all the above, including etiology of gallstones, what choledocholithiasis represents and potential for surgery to reduce the chances of this occurring again.  -The anatomy and physiology of the hepatobiliary system was discussed at length with the patient with associated pictures. The pathophysiology of gallbladder disease was discussed at length as well with both her and her son Legrand Como. -The options for treatment were discussed as noted above. We reviewed laparoscopic cholecystectomy, scenarios where partial cholecystectomy may be needed as well. -The planned procedures, material risks (including, but not limited to, pain, bleeding, infection, scarring, need for blood transfusion, damage to surrounding structures- blood vessels/nerves/viscus/organs, damage to bile duct, bile  leak, need for additional procedures, hernia, worsening of pre-existing medical conditions, pancreatitis, pneumonia, heart attack, stroke, death) benefits and alternatives to surgery were discussed at length. The patient's and her son's questions were answered to their satisfaction, they voiced understanding and they elected to proceed with surgery provided she is medically clear. Additionally, we discussed typical postoperative expectations and the recovery process.  I reviewed Consultant GI notes, hospitalist notes, last 24 h vitals and pain scores, last 48 h intake and output, last 24 h labs and trends, and last 24 h imaging results.  I spent a total of 50 minutes in both face-to-face and non-face-to-face activities, excluding procedures performed, for this visit on the date of this encounter.   LOS: 5 days   Nadeen Landau, MD [facility], [place]

## 2021-11-05 NOTE — Anesthesia Preprocedure Evaluation (Signed)
Anesthesia Evaluation  Patient identified by MRN, date of birth, ID band Patient awake    Reviewed: Allergy & Precautions, NPO status , Patient's Chart, lab work & pertinent test results  Airway Mallampati: II  TM Distance: >3 FB     Dental   Pulmonary former smoker,    breath sounds clear to auscultation       Cardiovascular + dysrhythmias  Rhythm:Regular Rate:Normal     Neuro/Psych  Headaches,  Neuromuscular disease    GI/Hepatic Neg liver ROS, GERD  ,  Endo/Other  negative endocrine ROS  Renal/GU Renal disease     Musculoskeletal  (+) Arthritis ,   Abdominal   Peds  Hematology   Anesthesia Other Findings   Reproductive/Obstetrics                             Anesthesia Physical Anesthesia Plan  ASA: 3  Anesthesia Plan: General   Post-op Pain Management:    Induction: Intravenous  PONV Risk Score and Plan: 3 and Treatment may vary due to age or medical condition and Ondansetron  Airway Management Planned:   Additional Equipment:   Intra-op Plan:   Post-operative Plan: Extubation in OR  Informed Consent: I have reviewed the patients History and Physical, chart, labs and discussed the procedure including the risks, benefits and alternatives for the proposed anesthesia with the patient or authorized representative who has indicated his/her understanding and acceptance.     Dental advisory given  Plan Discussed with: CRNA and Anesthesiologist  Anesthesia Plan Comments:         Anesthesia Quick Evaluation

## 2021-11-05 NOTE — Transfer of Care (Signed)
Immediate Anesthesia Transfer of Care Note  Patient: Leah [name]  Procedure(s) Performed: ENDOSCOPIC RETROGRADE CHOLANGIOPANCREATOGRAPHY (ERCP)  Patient Location: PACU  Anesthesia Type:General  Level of Consciousness: drowsy and patient cooperative  Airway & Oxygen Therapy: Patient Spontanous Breathing and Patient connected to nasal cannula oxygen  Post-op Assessment: Report given to RN and Post -op Vital signs reviewed and stable  Post vital signs: Reviewed and stable  Last Vitals:  Vitals Value Taken Time  BP 137/58 11/05/21 1142  Temp    Pulse 66 11/05/21 1144  Resp 20 11/05/21 1144  SpO2 97 % 11/05/21 1144  Vitals shown include unvalidated device data.  Last Pain:  Vitals:   11/05/21 1100  TempSrc:   PainSc: 0-No pain         Complications: No notable events documented.

## 2021-11-05 NOTE — Anesthesia Procedure Notes (Signed)
Procedure Name: Intubation Date/Time: 11/05/2021 10:14 AM Performed by: Rande Brunt, CRNA Pre-anesthesia Checklist: Patient identified, Emergency Drugs available, Suction available and Patient being monitored Patient Re-evaluated:Patient Re-evaluated prior to induction Oxygen Delivery Method: Circle System Utilized Preoxygenation: Pre-oxygenation with 100% oxygen Induction Type: IV induction Ventilation: Mask ventilation without difficulty Laryngoscope Size: Miller and 2 Grade View: Grade I Tube type: Oral Tube size: 7.0 mm Number of attempts: 1 Airway Equipment and Method: Stylet and Oral airway Placement Confirmation: ETT inserted through vocal cords under direct vision, positive ETCO2 and breath sounds checked- equal and bilateral Secured at: 21 cm Tube secured with: Tape Dental Injury: Teeth and Oropharynx as per pre-operative assessment

## 2021-11-05 NOTE — Plan of Care (Signed)
  Problem: Nutrition: Goal: Adequate nutrition will be maintained Outcome: Not Progressing   Problem: Safety: Goal: Ability to remain free from injury will improve Outcome: Not Progressing

## 2021-11-05 NOTE — Progress Notes (Addendum)
PROGRESS NOTE  TING CAGE  NOB:[id number] DOB: 1929-06-17 DOA: 10/31/2021 PCP: Hoyt Koch, MD   Brief Narrative:  Patient is 88 year old female with history of GERD, left bundle branch block who presented to the emergency department after her PCP requested her to go for the evaluation of abnormal labs.  Patient was having poor oral intake, heaviness on her legs at home.  No report of abdominal pain or vomiting, fever or chills or shortness of breath.  On presentation, lab work showed acute kidney injury with creatinine of 2.89, baseline creatinine around 1.   CT of the abdomen did not show any evidence of hydronephrosis but showed choledocholithiasis.  MRCP confirmed 14 mm stone, cholelithiasis with features of cholecystitis.  GI consulted.Underwent  ERCP today .General surgery also consulted  for the consideration of cholecystectomy.  Assessment & Plan:  Active Problems:   AKI (acute kidney injury) ([place] [place])   Choledocholithiasis  AKI on CKD stage IIIa: Baseline creatinine around 1.  Presented with creatinine of 2.8.  She was having poor oral intake at home.  Started on IV fluids with improvement in the kidney function.  Avoid nephrotoxins.CT of the abdomen did not show any hydronephrosis.  Choledocholithiasis/cholelithiasis: MRCP confirmed 14 mm stone, cholelithiasis with features of cholecystitis.  GI consulted.S/P ERCP , finding of biliary sludge, status post complete removal by biliary sphincterotomy. General surgery consulted  for the consideration of cholecystectomy.cardiology consultation obtained for clearance as per general surgery recommendation.  Abdomen is benign, no abdominal pain or tenderness.  LFTs normal.  Antibiotics were discontinued because of very low suspicion for cholecystitis  H/O LBBB: Currently in normal sinus rhythm.  Echo done on 11/2014 showed EF of 45 to 60%, no wall motion abnormality, grade 1 diastolic dysfunction.  Denies any chest pain.  She does not  have any anginal symptoms.  No dyspnea, fairly ambulatory at home. Given her advanced age, I have consulted cardiology for assistance with clearance for upcoming cholecystectomy.  Hypercalcemia: Resolved with IV fluids  Hypertension: Not taking any medication at home.  Started on amlodipine ,hydralazine. Now BP is stable  GERD: Continue PPI  Insomnia: Continue trazodone  Debility/deconditioning/weakness: H/O bilateral total hip replacements. PT/OT will be consulted if needed after surgery          DVT prophylaxis:enoxaparin (LOVENOX) injection 30 mg Start: 11/05/21 1000     Code Status: Full Code  Family Communication: Called and discussed with son on phone on 6/5  Patient status:Inpatient  Patient is from :Home  Anticipated discharge HU:TMLY   Estimated DC date: 1-2 days, needs to wait for recommendation from general surgery about cholecystectomy   Consultants: GI, general surgery Procedures:None  Antimicrobials:  Anti-infectives (From admission, onward)    Start     Dose/Rate Route Frequency Ordered Stop   11/02/21 0900  cefTRIAXone (ROCEPHIN) 2 g in sodium chloride 0.9 % 100 mL IVPB  Status:  Discontinued        2 g 200 mL/hr over 30 Minutes Intravenous Every 24 hours 11/02/21 0814 11/02/21 1409   11/02/21 0830  metroNIDAZOLE (FLAGYL) IVPB 500 mg  Status:  Discontinued        500 mg 100 mL/hr over 60 Minutes Intravenous Every 12 hours 11/02/21 0814 11/02/21 1409       Subjective:  Patient seen and examined at the bedside this afternoon after ERCP.  She was sleeping, appears drowsy most likely from the effect of anesthesia.  Denies any abdomen pain, nausea or vomiting.  Objective: Vitals:  11/05/21 1215 11/05/21 1230 11/05/21 1245 11/05/21 1249  BP: (!) 119/58 (!) 117/58 128/61   Pulse: 68 69 69 71  Resp: '18 17 18 18  '$ Temp:    98.5 F (36.9 C)  TempSrc:      SpO2: 96% 98% 98% 95%  Weight:      Height:        Intake/Output Summary (Last 24  hours) at 11/05/2021 1328 Last data filed at 11/05/2021 1255 Gross per 24 hour  Intake 3824.97 ml  Output 1700 ml  Net 2124.97 ml   Filed Weights   11/02/21 0527 11/04/21 0532 11/05/21 0621  Weight: 62.5 kg 61.9 kg 59 kg    Examination:  General exam: Overall comfortable, not in distress, pleasant elderly female HEENT: PERRL Respiratory system:  no wheezes or crackles  Cardiovascular system: S1 & S2 heard, RRR.  Gastrointestinal system: Abdomen is nondistended, soft and nontender. Central nervous system: Alert and oriented Extremities: No edema, no clubbing ,no cyanosis Skin: No rashes, no ulcers,no icterus     Data Reviewed: I have personally reviewed following labs and imaging studies  CBC: Recent Labs  Lab 10/31/21 1017 10/31/21 2115 11/01/21 0302 11/04/21 0101  WBC 5.7 6.6 6.0 6.1  NEUTROABS 3.7 3.7 3.0 3.6  HGB 11.7* 11.4* 10.6* 11.7*  HCT 34.9* 34.4* 32.2* 35.0*  MCV 89.1 90.8 90.2 89.3  PLT 188.0 200 167 007   Basic Metabolic Panel: Recent Labs  Lab 11/01/21 0302 11/02/21 0238 11/03/21 0350 11/04/21 0101 11/05/21 0442  NA 143 139 141 142 140  K 3.3* 3.6 3.5 3.3* 3.3*  CL 106 105 108 108 107  CO2 '29 26 24 25 22  '$ GLUCOSE 126* 147* 88 100* 93  BUN 50* 45* 29* 31* 21  CREATININE 2.74* 2.26* 1.60* 1.71* 1.33*  CALCIUM 11.2* 10.3 9.4 9.9 9.6  MG 1.8  --   --   --   --   PHOS 4.1  --   --   --   --      Recent Results (from the past 240 hour(s))  Culture, blood (Routine X 2) w Reflex to ID Panel     Status: None (Preliminary result)   Collection Time: 11/02/21  9:48 AM   Specimen: BLOOD RIGHT HAND  Result Value Ref Range Status   Specimen Description BLOOD RIGHT HAND  Final   Special Requests   Final    BOTTLES DRAWN AEROBIC AND ANAEROBIC Blood Culture adequate volume   Culture   Final    NO GROWTH 3 DAYS Performed at [facility], [address] [place], [place] [address]    Report Status PENDING  Incomplete  Culture, blood (Routine X 2) w  Reflex to ID Panel     Status: None (Preliminary result)   Collection Time: 11/02/21  9:48 AM   Specimen: BLOOD LEFT HAND  Result Value Ref Range Status   Specimen Description BLOOD LEFT HAND  Final   Special Requests   Final    BOTTLES DRAWN AEROBIC AND ANAEROBIC Blood Culture adequate volume   Culture   Final    NO GROWTH 3 DAYS Performed at [facility], [place] [address] [place], [place] [address]    Report Status PENDING  Incomplete     Radiology Studies: DG ERCP  Result Date: 11/05/2021 CLINICAL DATA:  Choledocholithiasis EXAM: ERCP TECHNIQUE: Multiple spot images obtained with the fluoroscopic device and submitted for interpretation post-procedure. FLUOROSCOPY: Radiation Exposure Index (as provided by the fluoroscopic  device): 57.76 mGy Kerma COMPARISON:  MRCP 11/01/2021 FINDINGS: A total of 8 train intraoperative saved images are submitted for review. The images demonstrate a flexible duodenal scope in the descending duodenum with wire cannulation of the common bile duct. Cholangiogram demonstrates multiple faceted filling defects within the mid and distal common bile duct consistent with choledocholithiasis. Subsequent images confirm balloon sphincterotomy followed by balloon sweeping of the common duct. IMPRESSION: 1. Choledocholithiasis. 2. Balloon sphincterotomy and balloon sweeping of the common duct. These images were submitted for radiologic interpretation only. Please see the procedural report for the amount of contrast and the fluoroscopy time utilized. Electronically Signed   By: Jacqulynn Cadet M.D.   On: 11/05/2021 11:58    Scheduled Meds:  amLODipine  10 mg Oral Daily   aspirin EC  81 mg Oral Daily   enoxaparin (LOVENOX) injection  30 mg Subcutaneous Q24H   hydrALAZINE  100 mg Oral Q8H   latanoprost  1 drop Both Eyes QHS   pantoprazole  40 mg Oral BID   timolol  1 drop Both Eyes BID   Continuous Infusions:     LOS: 5 days   Shelly Coss, MD [facility] P6/10/2021, 1:28 PM

## 2021-11-05 NOTE — Interval H&P Note (Signed)
History and Physical Interval Note:  11/05/2021 9:45 AM  Leah [name]  has presented today for surgery, with the diagnosis of Choledocholithiasis.  The various methods of treatment have been discussed with the patient and family. After consideration of risks, benefits and other options for treatment, the patient has consented to  Procedure(s): ENDOSCOPIC RETROGRADE CHOLANGIOPANCREATOGRAPHY (ERCP) (N/A) as a surgical intervention.  The patient's history has been reviewed, patient examined, no change in status, stable for surgery.  I have reviewed the patient's chart and labs.  Questions were answered to the patient's satisfaction.     The risks of an ERCP were discussed at length, including but not limited to the risk of perforation, bleeding, abdominal pain, post-ERCP pancreatitis (while usually mild can be severe and even life threatening).    [employer]

## 2021-11-05 NOTE — Op Note (Signed)
[facility] Patient Name: Leah [name] Procedure Date : 11/05/2021 MRN: [id number] Attending MD: Justice Britain , MD Date of Birth: [birth year] CSN: [id number] Age: 88 Admit Type: Inpatient Procedure:                ERCP Indications:              Bile duct stone(s), Abnormal abdominal CT, Abnormal                            MRCP Providers:                Justice Britain, MD, Carmie End, RN,                            William Dalton, Technician, Darliss Cheney, Technician Referring MD:             Lajuan Lines. Hilarie Fredrickson, MD, [facility], Ileana Roup MD, MD Medicines:                Monitored Anesthesia Care, General Anesthesia,                            Cipro 400 mg IV, Indomethacin 100 mg PR, Glucagon                            4.38 mg IV Complications:            No immediate complications. Estimated Blood Loss:     Estimated blood loss was minimal. Procedure:                Pre-Anesthesia Assessment:                           - Prior to the procedure, a History and Physical                            was performed, and patient medications and                            allergies were reviewed. The patient's tolerance of                            previous anesthesia was also reviewed. The risks                            and benefits of the procedure and the sedation                            options and risks were discussed with the patient.                            All questions were answered, and informed consent  was obtained. Prior Anticoagulants: The patient has                            taken no previous anticoagulant or antiplatelet                            agents. ASA Grade Assessment: III - A patient with                            severe systemic disease. After reviewing the risks                            and benefits, the patient was deemed in                             satisfactory condition to undergo the procedure.                           After obtaining informed consent, the scope was                            passed under direct vision. Throughout the                            procedure, the patient's blood pressure, pulse, and                            oxygen saturations were monitored continuously. The                            TJF-Q190V ([id number]) Olympus duodenoscope was                            introduced through the mouth, and used to inject                            contrast into and used to locate the major papilla.                            The ERCP was accomplished without difficulty. The                            patient tolerated the procedure. Scope In: Scope Out: Findings:      The scout film was normal.      The upper GI tract was traversed under direct vision without detailed       examination. A low-grade of narrowing Schatzki ring was found at the       gastroesophageal junction (traversed with gentle pressure of the       duodenoscope). The Z-line was regular and was found 37 cm from the       incisors. A 4 cm hiatal hernia was present. Patchy mildly erythematous       mucosa without bleeding was found in the entire examined stomach.       Biopsies were  taken in the cardia, on the greater curvature of the       stomach, on the lesser curvature of the stomach, at the incisura and in       the gastric antrum through the ERCP scope with the cold forceps for       histology. No gross mucosal lesions were noted in the duodenal bulb, in       the first portion of the duodenum and in the second portion of the       duodenum. The major papilla was located entirely within a diverticulum.       The major papilla was very small. A short 0.035 inch Soft Jagwire was       passed into the biliary tree. The Hydratome sphincterotome was passed       over the guidewire and the bile duct was then deeply cannulated.       Contrast was  injected. I personally interpreted the bile duct images.       Ductal flow of contrast was adequate. Image quality was adequate.       Contrast extended to the bifurcation. Opacification of the entire       biliary tree except for the cystic duct and gallbladder was successful.       The main bile duct was moderately dilated. The largest diameter was 14       mm. The lower third of the main bile duct contained a single mild       shelf-like stenosis less than one mm in length just proximal to the       ampullary orifice of the biliary tree. The entire biliary tree contained       filling defects thought to be stones and sludge. A 6 mm biliary       sphincterotomy was made with a monofilament Hydratome sphincterotome       using ERBE electrocautery as I did not have any more access to make a       larger cut. There was no post-sphincterotomy bleeding. As a result of       the small sphincterotomy allowed, I decided to proceed with DASE, thus a       dilation of the distal common bile duct with a Hurricane 6 mm dilator       and then an 01-08-09 mm balloon (to a maximum balloon size of 10 mm) was       successful as sphincteroplasty for a total of 4 minutes overall. I noted       improvement and resolution of the previous biliary shelf/stenosis by       having done this. The biliary tree was swept with a retrieval balloon       multiple times. Sludge was swept from the duct. Multiple stones were       removed. No stones remained. An occlusion cholangiogram was performed       that showed no further significant biliary pathology.      A pancreatogram was not performed.      The duodenoscope was withdrawn from the patient. Impression:               - Low-grade of narrowing Schatzki ring - traversed                            with gentle pressure. Z-line regular, 37 cm from  the incisors.                           - 4 cm hiatal hernia.                           -  Erythematous mucosa in the stomach - biopsied for                            HP evaluation.                           - No gross lesions in the duodenal bulb, in the                            first portion of the duodenum and in the second                            portion of the duodenum.                           - The major papilla was located entirely within a                            diverticulum and was quite small.                           - Filling defects consistent with stones and sludge                            were seen on the cholangiogram. The entire main                            bile duct was moderately dilated.                           - A biliary shelf was found, just proximal the                            biliary orifice in the lower third of the main bile                            duct. The stricture was fibrotic in appearance -                            after dilation it was no longer present.                           - Choledocholithiasis and biliary sludge was found.                            Complete removal was accomplished by biliary  sphincterotomy and DASE sphincteroplasty and                            balloon trawl. Recommendation:           - The patient will be observed post-procedure,                            until all discharge criteria are met.                           - Return patient to hospital ward for ongoing care.                           - Advance diet as tolerated.                           - Observe patient's clinical course.                           - Check liver enzymes (AST, ALT, alkaline                            phosphatase, bilirubin) in the morning.                           - Further discussion with patient/family and                            surgery in regards to consideration for definitive                            therapy and Cholecystectomy (IPT/OPT/Monitoring).                            - Watch for pancreatitis, bleeding, perforation,                            and cholangitis.                           - The findings and recommendations were discussed                            with the patient.                           - The findings and recommendations were discussed                            with the referring physician. Procedure Code(s):        --- Professional ---                           [phone], Esophagogastroduodenoscopy, flexible,  transoral; with biopsy, single or multiple Diagnosis Code(s):        --- Professional ---                           K22.2, Esophageal obstruction                           K80.51, Calculus of bile duct without cholangitis                            or cholecystitis with obstruction                           K44.9, Diaphragmatic hernia without obstruction or                            gangrene                           K31.89, Other diseases of stomach and duodenum                           K83.8, Other specified diseases of biliary tract                           R93.2, Abnormal findings on diagnostic imaging of                            liver and biliary tract                           R93.5, Abnormal findings on diagnostic imaging of                            other abdominal regions, including retroperitoneum CPT copyright 2019 [employer]. All rights reserved. The codes documented in this report are preliminary and upon coder review may  be revised to meet current compliance requirements. Justice Britain, MD 11/05/2021 12:02:51 PM Number of Addenda: 0

## 2021-11-06 ENCOUNTER — Inpatient Hospital Stay (HOSPITAL_COMMUNITY): Payer: Medicare PPO

## 2021-11-06 ENCOUNTER — Inpatient Hospital Stay (HOSPITAL_COMMUNITY): Payer: Medicare PPO | Admitting: Anesthesiology

## 2021-11-06 ENCOUNTER — Other Ambulatory Visit: Payer: Self-pay

## 2021-11-06 ENCOUNTER — Encounter (HOSPITAL_COMMUNITY): Payer: Self-pay | Admitting: Gastroenterology

## 2021-11-06 ENCOUNTER — Encounter (HOSPITAL_COMMUNITY)
Admission: EM | Disposition: A | Discharge status: Home Health | Payer: Self-pay | Source: Home / Self Care | Attending: Internal Medicine

## 2021-11-06 DIAGNOSIS — K8050 Calculus of bile duct without cholangitis or cholecystitis without obstruction: Secondary | ICD-10-CM | POA: Diagnosis not present

## 2021-11-06 DIAGNOSIS — I5032 Chronic diastolic (congestive) heart failure: Secondary | ICD-10-CM

## 2021-11-06 DIAGNOSIS — Z9889 Other specified postprocedural states: Secondary | ICD-10-CM | POA: Diagnosis not present

## 2021-11-06 DIAGNOSIS — N179 Acute kidney failure, unspecified: Secondary | ICD-10-CM | POA: Diagnosis not present

## 2021-11-06 DIAGNOSIS — K8051 Calculus of bile duct without cholangitis or cholecystitis with obstruction: Secondary | ICD-10-CM | POA: Diagnosis not present

## 2021-11-06 DIAGNOSIS — K8044 Calculus of bile duct with chronic cholecystitis without obstruction: Secondary | ICD-10-CM

## 2021-11-06 DIAGNOSIS — K838 Other specified diseases of biliary tract: Secondary | ICD-10-CM | POA: Diagnosis not present

## 2021-11-06 DIAGNOSIS — G709 Myoneural disorder, unspecified: Secondary | ICD-10-CM

## 2021-11-06 DIAGNOSIS — I454 Nonspecific intraventricular block: Secondary | ICD-10-CM

## 2021-11-06 DIAGNOSIS — N289 Disorder of kidney and ureter, unspecified: Secondary | ICD-10-CM

## 2021-11-06 DIAGNOSIS — Z01810 Encounter for preprocedural cardiovascular examination: Secondary | ICD-10-CM

## 2021-11-06 HISTORY — PX: CHOLECYSTECTOMY: SHX55

## 2021-11-06 LAB — ECHOCARDIOGRAM COMPLETE
Area-P 1/2: 3.95 cm2
Calc EF: 54.9 %
Height: 59 in
MV VTI: 2.96 cm2
S' Lateral: 2.1 cm
Single Plane A2C EF: 58.5 %
Single Plane A4C EF: 54.3 %
Weight: 2081.14 oz

## 2021-11-06 LAB — CBC
HCT: 34.4 % — ABNORMAL LOW (ref 36.0–46.0)
Hemoglobin: 11.2 g/dL — ABNORMAL LOW (ref 12.0–15.0)
MCH: 29.2 pg (ref 26.0–34.0)
MCHC: 32.6 g/dL (ref 30.0–36.0)
MCV: 89.8 fL (ref 80.0–100.0)
Platelets: 203 10*3/uL (ref 150–400)
RBC: 3.83 MIL/uL — ABNORMAL LOW (ref 3.87–5.11)
RDW: 13.6 % (ref 11.5–15.5)
WBC: 10.2 10*3/uL (ref 4.0–10.5)
nRBC: 0 % (ref 0.0–0.2)

## 2021-11-06 LAB — COMPREHENSIVE METABOLIC PANEL
ALT: 18 U/L (ref 0–44)
AST: 22 U/L (ref 15–41)
Albumin: 2.9 g/dL — ABNORMAL LOW (ref 3.5–5.0)
Alkaline Phosphatase: 59 U/L (ref 38–126)
Anion gap: 13 (ref 5–15)
BUN: 39 mg/dL — ABNORMAL HIGH (ref 8–23)
CO2: 18 mmol/L — ABNORMAL LOW (ref 22–32)
Calcium: 9.3 mg/dL (ref 8.9–10.3)
Chloride: 103 mmol/L (ref 98–111)
Creatinine, Ser: 1.87 mg/dL — ABNORMAL HIGH (ref 0.44–1.00)
GFR, Estimated: 25 mL/min — ABNORMAL LOW (ref >60)
Glucose, Bld: 165 mg/dL — ABNORMAL HIGH (ref 70–99)
Potassium: 4.4 mmol/L (ref 3.5–5.1)
Sodium: 134 mmol/L — ABNORMAL LOW (ref 135–145)
Total Bilirubin: 0.5 mg/dL (ref 0.3–1.2)
Total Protein: 6.1 g/dL — ABNORMAL LOW (ref 6.5–8.1)

## 2021-11-06 LAB — GLUCOSE, CAPILLARY: Glucose-Capillary: 114 mg/dL — ABNORMAL HIGH (ref 70–99)

## 2021-11-06 LAB — SURGICAL PATHOLOGY

## 2021-11-06 SURGERY — LAPAROSCOPIC CHOLECYSTECTOMY
Anesthesia: General | Site: Abdomen

## 2021-11-06 MED ORDER — BUPIVACAINE-EPINEPHRINE 0.25% -1:200000 IJ SOLN
INTRAMUSCULAR | Status: DC | PRN
  Administered 2021-11-06: 18 mL

## 2021-11-06 MED ORDER — PHENYLEPHRINE HCL (PRESSORS) 10 MG/ML IV SOLN
INTRAVENOUS | Status: AC
  Filled 2021-11-06: qty 1

## 2021-11-06 MED ORDER — SUGAMMADEX SODIUM 200 MG/2ML IV SOLN
INTRAVENOUS | Status: DC | PRN
  Administered 2021-11-06: 200 mg via INTRAVENOUS

## 2021-11-06 MED ORDER — SODIUM CHLORIDE 0.9 % IV SOLN
1.5000 g | Freq: Once | INTRAVENOUS | Status: AC
  Administered 2021-11-06: 1.5 g via INTRAVENOUS
  Filled 2021-11-06 (×2): qty 4

## 2021-11-06 MED ORDER — SODIUM CHLORIDE 0.9 % IR SOLN
Status: DC | PRN
  Administered 2021-11-06: 1000 mL

## 2021-11-06 MED ORDER — SODIUM CHLORIDE 0.9 % IV SOLN: INTRAVENOUS | Status: DC

## 2021-11-06 MED ORDER — PROPOFOL 10 MG/ML IV BOLUS
INTRAVENOUS | Status: DC | PRN
  Administered 2021-11-06: 100 mg via INTRAVENOUS

## 2021-11-06 MED ORDER — FENTANYL CITRATE (PF) 250 MCG/5ML IJ SOLN
INTRAMUSCULAR | Status: DC | PRN
  Administered 2021-11-06: 100 ug via INTRAVENOUS

## 2021-11-06 MED ORDER — FENTANYL CITRATE (PF) 250 MCG/5ML IJ SOLN
INTRAMUSCULAR | Status: AC
  Filled 2021-11-06: qty 5

## 2021-11-06 MED ORDER — LIDOCAINE 2% (20 MG/ML) 5 ML SYRINGE
INTRAMUSCULAR | Status: AC
  Filled 2021-11-06: qty 5

## 2021-11-06 MED ORDER — LIDOCAINE 2% (20 MG/ML) 5 ML SYRINGE
INTRAMUSCULAR | Status: DC | PRN
  Administered 2021-11-06: 100 mg via INTRAVENOUS

## 2021-11-06 MED ORDER — INDOCYANINE GREEN 25 MG IV SOLR
INTRAVENOUS | Status: DC | PRN
  Administered 2021-11-06: 2.5 mg via INTRAVENOUS

## 2021-11-06 MED ORDER — SUCCINYLCHOLINE CHLORIDE 200 MG/10ML IV SOSY
PREFILLED_SYRINGE | INTRAVENOUS | Status: AC
  Filled 2021-11-06: qty 10

## 2021-11-06 MED ORDER — BUPIVACAINE-EPINEPHRINE (PF) 0.25% -1:200000 IJ SOLN
INTRAMUSCULAR | Status: AC
  Filled 2021-11-06: qty 30

## 2021-11-06 MED ORDER — 0.9 % SODIUM CHLORIDE (POUR BTL) OPTIME
TOPICAL | Status: DC | PRN
  Administered 2021-11-06: 1000 mL

## 2021-11-06 MED ORDER — ONDANSETRON HCL 4 MG/2ML IJ SOLN
INTRAMUSCULAR | Status: DC | PRN
  Administered 2021-11-06: 4 mg via INTRAVENOUS

## 2021-11-06 MED ORDER — ROCURONIUM BROMIDE 10 MG/ML (PF) SYRINGE
PREFILLED_SYRINGE | INTRAVENOUS | Status: AC
  Filled 2021-11-06: qty 10

## 2021-11-06 MED ORDER — PHENYLEPHRINE 80 MCG/ML (10ML) SYRINGE FOR IV PUSH (FOR BLOOD PRESSURE SUPPORT)
PREFILLED_SYRINGE | INTRAVENOUS | Status: AC
  Filled 2021-11-06: qty 10

## 2021-11-06 MED ORDER — DEXAMETHASONE SODIUM PHOSPHATE 10 MG/ML IJ SOLN
INTRAMUSCULAR | Status: DC | PRN
  Administered 2021-11-06: 10 mg via INTRAVENOUS

## 2021-11-06 MED ORDER — PHENYLEPHRINE 80 MCG/ML (10ML) SYRINGE FOR IV PUSH (FOR BLOOD PRESSURE SUPPORT)
PREFILLED_SYRINGE | INTRAVENOUS | Status: DC | PRN
  Administered 2021-11-06 (×3): 80 ug via INTRAVENOUS
  Administered 2021-11-06: 40 ug via INTRAVENOUS

## 2021-11-06 MED ORDER — SUCCINYLCHOLINE CHLORIDE 200 MG/10ML IV SOSY
PREFILLED_SYRINGE | INTRAVENOUS | Status: DC | PRN
  Administered 2021-11-06: 120 mg via INTRAVENOUS

## 2021-11-06 MED ORDER — FENTANYL CITRATE (PF) 100 MCG/2ML IJ SOLN: 25.0000 ug | INTRAMUSCULAR | Status: DC | PRN

## 2021-11-06 MED ORDER — ROCURONIUM BROMIDE 10 MG/ML (PF) SYRINGE
PREFILLED_SYRINGE | INTRAVENOUS | Status: DC | PRN
  Administered 2021-11-06: 30 mg via INTRAVENOUS
  Administered 2021-11-06: 20 mg via INTRAVENOUS

## 2021-11-06 MED ORDER — CHLORHEXIDINE GLUCONATE 0.12 % MT SOLN
15.0000 mL | Freq: Once | OROMUCOSAL | Status: DC
  Filled 2021-11-06: qty 15

## 2021-11-06 MED ORDER — OXYCODONE HCL 5 MG PO TABS: 2.5000 mg | ORAL_TABLET | ORAL | Status: DC | PRN

## 2021-11-06 MED ORDER — CHLORHEXIDINE GLUCONATE 0.12 % MT SOLN
OROMUCOSAL | Status: AC
  Filled 2021-11-06: qty 15

## 2021-11-06 MED ORDER — PROPOFOL 10 MG/ML IV BOLUS
INTRAVENOUS | Status: AC
  Filled 2021-11-06: qty 20

## 2021-11-06 MED ORDER — LACTATED RINGERS IV SOLN: INTRAVENOUS | Status: DC | PRN

## 2021-11-06 SURGICAL SUPPLY — 45 items
ADH SKN CLS APL DERMABOND .7 (GAUZE/BANDAGES/DRESSINGS) ×3
APL PRP STRL LF DISP 70% ISPRP (MISCELLANEOUS) ×3
APPLIER CLIP 5 13 M/L LIGAMAX5 (MISCELLANEOUS) ×4
APR CLP MED LRG 5 ANG JAW (MISCELLANEOUS) ×3
BAG COUNTER SPONGE SURGICOUNT (BAG) ×5 IMPLANT
BAG RETRIEVAL 10 (BASKET) ×1
BAG SPNG CNTER NS LX DISP (BAG) ×3
BLADE CLIPPER SURG (BLADE) IMPLANT
CANISTER SUCT 3000ML PPV (MISCELLANEOUS) ×5 IMPLANT
CHLORAPREP W/TINT 26 (MISCELLANEOUS) ×5 IMPLANT
CLIP APPLIE 5 13 M/L LIGAMAX5 (MISCELLANEOUS) ×4 IMPLANT
COVER MAYO STAND STRL (DRAPES) ×5 IMPLANT
COVER SURGICAL LIGHT HANDLE (MISCELLANEOUS) ×5 IMPLANT
DERMABOND ADVANCED (GAUZE/BANDAGES/DRESSINGS) ×1
DERMABOND ADVANCED .7 DNX12 (GAUZE/BANDAGES/DRESSINGS) ×4 IMPLANT
DISSECTOR BLUNT TIP ENDO 5MM (MISCELLANEOUS) IMPLANT
DRAPE C-ARM 42X120 X-RAY (DRAPES) ×5 IMPLANT
ELECT REM PT RETURN 9FT ADLT (ELECTROSURGICAL) ×4
ELECTRODE REM PT RTRN 9FT ADLT (ELECTROSURGICAL) ×4 IMPLANT
GLOVE BIO SURGEON STRL SZ7.5 (GLOVE) ×5 IMPLANT
GLOVE INDICATOR 8.0 STRL GRN (GLOVE) ×5 IMPLANT
GOWN STRL REUS W/ TWL LRG LVL3 (GOWN DISPOSABLE) ×8 IMPLANT
GOWN STRL REUS W/ TWL XL LVL3 (GOWN DISPOSABLE) ×4 IMPLANT
GOWN STRL REUS W/TWL LRG LVL3 (GOWN DISPOSABLE) ×8
GOWN STRL REUS W/TWL XL LVL3 (GOWN DISPOSABLE) ×4
KIT BASIN OR (CUSTOM PROCEDURE TRAY) ×5 IMPLANT
KIT TURNOVER KIT B (KITS) ×5 IMPLANT
NS IRRIG 1000ML POUR BTL (IV SOLUTION) ×5 IMPLANT
PAD ARMBOARD 7.5X6 YLW CONV (MISCELLANEOUS) ×5 IMPLANT
PENCIL BUTTON HOLSTER BLD 10FT (ELECTRODE) ×5 IMPLANT
SCISSORS LAP 5X35 DISP (ENDOMECHANICALS) ×5 IMPLANT
SET CHOLANGIOGRAPH 5 50 .035 (SET/KITS/TRAYS/PACK) ×3 IMPLANT
SET IRRIG TUBING LAPAROSCOPIC (IRRIGATION / IRRIGATOR) ×5 IMPLANT
SET TUBE SMOKE EVAC HIGH FLOW (TUBING) ×5 IMPLANT
SLEEVE ADV FIXATION 5X100MM (TROCAR) ×4 IMPLANT
SUT MNCRL AB 4-0 PS2 18 (SUTURE) ×5 IMPLANT
SYS BAG RETRIEVAL 10MM (BASKET) ×3
SYSTEM BAG RETRIEVAL 10MM (BASKET) ×1 IMPLANT
TOWEL GREEN STERILE (TOWEL DISPOSABLE) ×5 IMPLANT
TOWEL GREEN STERILE FF (TOWEL DISPOSABLE) ×5 IMPLANT
TRAY LAPAROSCOPIC MC (CUSTOM PROCEDURE TRAY) ×5 IMPLANT
TROCAR ADV FIXATION 5X100MM (TROCAR) ×11 IMPLANT
TROCAR BALLN 12MMX100 BLUNT (TROCAR) ×2 IMPLANT
TROCAR XCEL BLUNT TIP 100MML (ENDOMECHANICALS) ×3 IMPLANT
WATER STERILE IRR 1000ML POUR (IV SOLUTION) ×5 IMPLANT

## 2021-11-06 NOTE — Progress Notes (Signed)
Progress Note  Patient Name: Leah [name] Date of Encounter: 11/06/2021  [facility] Cardiologist: Croitoru  Subjective   No cardiac symptoms.  Inpatient Medications    Scheduled Meds:  amLODipine  10 mg Oral Daily   aspirin EC  81 mg Oral Daily   enoxaparin (LOVENOX) injection  30 mg Subcutaneous Q24H   hydrALAZINE  100 mg Oral Q8H   latanoprost  1 drop Both Eyes QHS   pantoprazole  40 mg Oral BID   timolol  1 drop Both Eyes BID   Continuous Infusions:  sodium chloride     PRN Meds: acetaminophen, labetalol, melatonin, ondansetron (ZOFRAN) IV, polyethylene glycol   Vital Signs    Vitals:   11/05/21 1517 11/05/21 2010 11/06/21 0530 11/06/21 0838  BP: (!) 141/67 127/66 137/73 127/70  Pulse: 77 81 77 80  Resp: '17 18 18 17  '$ Temp: 97.6 F (36.4 C) 97.6 F (36.4 C) (!) 97.5 F (36.4 C) 98.4 F (36.9 C)  TempSrc: Oral Oral Oral Oral  SpO2: 92% 93% 96% 91%  Weight:      Height:        Intake/Output Summary (Last 24 hours) at 11/06/2021 1200 Last data filed at 11/06/2021 0534 Gross per 24 hour  Intake 320 ml  Output 500 ml  Net -180 ml      11/05/2021    6:21 AM 11/04/2021    5:32 AM 11/02/2021    5:27 AM  Last 3 Weights  Weight (lbs) 130 lb 1.1 oz 136 lb 7.4 oz 137 lb 12.6 oz  Weight (kg) 59 kg 61.9 kg 62.5 kg      Telemetry    No concerning data- Personally Reviewed  ECG    We will perform an up-to-date EKG for this admission.- Personally Reviewed  Physical Exam  Elderly GEN: No acute distress.   Neck: No JVD Cardiac: RRR, 1/6 to 2/6 systolic murmur of aortic valve origin, rubs, or gallops.  Respiratory: Clear to auscultation bilaterally. GI: Soft, nontender, non-distended  MS: No edema; No deformity. Neuro:  Nonfocal  Psych: Normal affect   Labs    High Sensitivity Troponin:  No results for input(s): TROPONINIHS in the last 720 hours.   Chemistry Recent Labs  Lab 11/01/21 0302 11/02/21 0238 11/03/21 0350 11/04/21 0101  11/05/21 0442 11/06/21 0446  NA 143   < > 141 142 140 134*  K 3.3*   < > 3.5 3.3* 3.3* 4.4  CL 106   < > 108 108 107 103  CO2 29   < > '24 25 22 '$ 18*  GLUCOSE 126*   < > 88 100* 93 165*  BUN 50*   < > 29* 31* 21 39*  CREATININE 2.74*   < > 1.60* 1.71* 1.33* 1.87*  CALCIUM 11.2*   < > 9.4 9.9 9.6 9.3  MG 1.8  --   --   --   --   --   PROT 5.7*  --  5.4* 6.1*  --  6.1*  ALBUMIN 3.1*  --  2.7* 3.1*  --  2.9*  AST 20  --  18 19  --  22  ALT 15  --  13 15  --  18  ALKPHOS 66  --  57 64  --  59  BILITOT 0.7  --  0.2* 0.6  --  0.5  GFRNONAA 16*   < > 31* 28* 38* 25*  ANIONGAP 8   < > '9 9 11 13   '$ < > =  values in this interval not displayed.    Lipids No results for input(s): CHOL, TRIG, HDL, LABVLDL, LDLCALC, CHOLHDL in the last 168 hours.  Hematology Recent Labs  Lab 11/01/21 0302 11/04/21 0101 11/06/21 0446  WBC 6.0 6.1 10.2  RBC 3.57* 3.92 3.83*  HGB 10.6* 11.7* 11.2*  HCT 32.2* 35.0* 34.4*  MCV 90.2 89.3 89.8  MCH 29.7 29.8 29.2  MCHC 32.9 33.4 32.6  RDW 13.4 13.2 13.6  PLT 167 171 203   Thyroid  Recent Labs  Lab 10/31/21 1017  TSH 4.07  FREET4 1.15    BNP Recent Labs  Lab 10/31/21 1017 10/31/21 2011 11/05/21 0442  BNP  --  206.4* 181.5*  PROBNP 145.0*  --   --     DDimer No results for input(s): DDIMER in the last 168 hours.   Radiology    DG ERCP  Result Date: 11/05/2021 CLINICAL DATA:  Choledocholithiasis EXAM: ERCP TECHNIQUE: Multiple spot images obtained with the fluoroscopic device and submitted for interpretation post-procedure. FLUOROSCOPY: Radiation Exposure Index (as provided by the fluoroscopic device): 57.76 mGy Kerma COMPARISON:  MRCP 11/01/2021 FINDINGS: A total of 8 train intraoperative saved images are submitted for review. The images demonstrate a flexible duodenal scope in the descending duodenum with wire cannulation of the common bile duct. Cholangiogram demonstrates multiple faceted filling defects within the mid and distal common bile duct  consistent with choledocholithiasis. Subsequent images confirm balloon sphincterotomy followed by balloon sweeping of the common duct. IMPRESSION: 1. Choledocholithiasis. 2. Balloon sphincterotomy and balloon sweeping of the common duct. These images were submitted for radiologic interpretation only. Please see the procedural report for the amount of contrast and the fluoroscopy time utilized. Electronically Signed   By: Jacqulynn Cadet M.D.   On: 11/05/2021 11:58   ECHOCARDIOGRAM COMPLETE  Result Date: 11/06/2021    ECHOCARDIOGRAM REPORT   Patient Name:   Leah [name] Date of Exam: 11/06/2021 Medical Rec #:  [id number]         Height:       59.0 in Accession #:    [id number]        Weight:       130.1 lb Date of Birth:  04-05-1930        BSA:          1.536 m Patient Age:    88 years          BP:           137/73 mmHg Patient Gender: F                 HR:           76 bpm. Exam Location:  Inpatient Procedure: 2D Echo, 3D Echo, Cardiac Doppler and Color Doppler Indications:    ; Z01.818 Encounter for other preprocedural examination  History:        Patient has prior history of Echocardiogram examinations, most                 recent 11/23/2014. Abnormal ECG, Arrythmias:LBBB and AV block;                 Signs/Symptoms:Edema and Murmur.  Sonographer:    Roseanna Rainbow RDCS Referring Phys: [id number] Prentiss  1. Left ventricular ejection fraction, by estimation, is 50 to 55%. Left ventricular ejection fraction by 3D volume is 54 %. The left ventricle has low normal function. The left ventricle has no regional wall motion abnormalities. There is severe  hypertrophy of the basal septal segment. The rest of the LV segments demonstrate mild concentric left ventricular hypertrophy. Left ventricular diastolic parameters are consistent with Grade I diastolic dysfunction (impaired relaxation). There is septal-lateral dyssynchrony due to LBBB.  2. Right ventricular systolic function is normal. The right  ventricular size is normal. Tricuspid regurgitation signal is inadequate for assessing PA pressure.  3. Left atrial size was mildly dilated.  4. The mitral valve is grossly normal. Trivial mitral valve regurgitation. No evidence of mitral stenosis. Moderate mitral annular calcification.  5. The aortic valve is tricuspid. There is mild calcification of the aortic valve. There is mild thickening of the aortic valve. Aortic valve regurgitation is not visualized. Aortic valve sclerosis/calcification is present, without any evidence of aortic stenosis.  6. The inferior vena cava is dilated in size with >50% respiratory variability, suggesting right atrial pressure of 8 mmHg. Comparison(s): Compared to prior TTE report in 2016, the LVEF is slightly lower at 54% (previously 55-60%). Otherwise, there is no significant change. FINDINGS  Left Ventricle: Left ventricular ejection fraction, by estimation, is 50 to 55%. Left ventricular ejection fraction by 3D volume is 54 %. The left ventricle has low normal function. The left ventricle has no regional wall motion abnormalities. The left ventricular internal cavity size was normal in size. There is severe hypertrophy of the basal septal segment. The rest of the LV segments demonstrate mild concentric left ventricular hypertrophy. Abnormal (paradoxical) septal motion, consistent with left  bundle branch block. Left ventricular diastolic parameters are consistent with Grade I diastolic dysfunction (impaired relaxation). Right Ventricle: The right ventricular size is normal. No increase in right ventricular wall thickness. Right ventricular systolic function is normal. Tricuspid regurgitation signal is inadequate for assessing PA pressure. Left Atrium: Left atrial size was mildly dilated. Right Atrium: Right atrial size was normal in size. Pericardium: There is no evidence of pericardial effusion. Mitral Valve: The mitral valve is grossly normal. There is mild thickening of the  mitral valve leaflet(s). There is mild calcification of the mitral valve leaflet(s). Moderate mitral annular calcification. Trivial mitral valve regurgitation. No evidence of mitral valve stenosis. MV peak gradient, 10.1 mmHg. The mean mitral valve gradient is 3.0 mmHg. Tricuspid Valve: The tricuspid valve is normal in structure. Tricuspid valve regurgitation is trivial. Aortic Valve: The aortic valve is tricuspid. There is mild calcification of the aortic valve. There is mild thickening of the aortic valve. Aortic valve regurgitation is not visualized. Aortic valve sclerosis/calcification is present, without any evidence of aortic stenosis. Pulmonic Valve: The pulmonic valve was normal in structure. Pulmonic valve regurgitation is trivial. Aorta: The aortic root and ascending aorta are structurally normal, with no evidence of dilitation. Venous: The inferior vena cava is dilated in size with greater than 50% respiratory variability, suggesting right atrial pressure of 8 mmHg. IAS/Shunts: The atrial septum is grossly normal.  LEFT VENTRICLE PLAX 2D LVIDd:         2.60 cm         Diastology LVIDs:         2.10 cm         LV e' medial:    4.57 cm/s LV PW:         1.90 cm         LV E/e' medial:  16.6 LV IVS:        1.80 cm         LV e' lateral:   7.29 cm/s LVOT diam:  2.10 cm         LV E/e' lateral: 10.4 LV SV:         83 LV SV Index:   54 LVOT Area:     3.46 cm        3D Volume EF                                LV 3D EF:    Left                                             ventricul LV Volumes (MOD)                            ar LV vol d, MOD    68.0 ml                    ejection A2C:                                        fraction LV vol d, MOD    60.6 ml                    by 3D A4C:                                        volume is LV vol s, MOD    28.2 ml                    54 %. A2C: LV vol s, MOD    27.7 ml A4C:                           3D Volume EF: LV SV MOD A2C:   39.8 ml       3D EF:        54 % LV SV  MOD A4C:   60.6 ml       LV EDV:       121 ml LV SV MOD BP:    35.8 ml       LV ESV:       56 ml                                LV SV:        65 ml RIGHT VENTRICLE             IVC RV S prime:     10.70 cm/s  IVC diam: 2.60 cm TAPSE (M-mode): 2.9 cm LEFT ATRIUM             Index        RIGHT ATRIUM           Index LA diam:        3.50 cm 2.28 cm/m   RA Area:     11.90 cm LA Vol (A2C):   54.8 ml 35.67 ml/m  RA Volume:   25.90 ml  16.86 ml/m LA Vol (A4C):   28.9 ml 18.81 ml/m LA Biplane Vol: 40.8 ml 26.56 ml/m  AORTIC VALVE LVOT Vmax:   132.00 cm/s LVOT Vmean:  82.300 cm/s LVOT VTI:    0.239 m  AORTA Ao Root diam: 3.30 cm Ao Asc diam:  3.10 cm MITRAL VALVE MV Area (PHT): 3.95 cm     SHUNTS MV Area VTI:   2.96 cm     Systemic VTI:  0.24 m MV Peak grad:  10.1 mmHg    Systemic Diam: 2.10 cm MV Mean grad:  3.0 mmHg MV Vmax:       1.59 m/s MV Vmean:      87.4 cm/s MV Decel Time: 192 msec MV E velocity: 76.00 cm/s MV A velocity: 123.50 cm/s MV E/A ratio:  0.62 Gwyndolyn Kaufman MD Electronically signed by Gwyndolyn Kaufman MD Signature Date/Time: 11/06/2021/11:32:18 AM    Final     Cardiac Studies   Echo demonstrates left ventricular hypertrophy and diastolic dysfunction with low normal EF.  There is also mild aortic valve stenosis/sclerosis accounting for the murmur.  No significant valvular abnormality noted.  Patient Profile     88 y.o. female with a hx of GERD, venous insufficiency, chronic LBBB, and generative arthritis who is being seen 11/05/2021 for preoperative evaluation at the request of Dr. Tamsen Meek.  Assessment & Plan    Preoperative clearance: Patient is cleared from cardiac standpoint to proceed with laparoscopic cholecystectomy.  No further work-up is felt indicated.  She does have diastolic heart failure will be prone to develop congestion if challenged with excessive intravenous fluid administration. Chronic diastolic heart failure: No evidence of pulmonary congestion/frank heart failure.  She  is stable. Left bundle branch block: We will obtain EKG for baseline during this hospital stay.  [facility] will sign off.   Medication Recommendations: Cleared for surgery. Other recommendations (labs, testing, etc): Notify us if any concerns or problems develop. Follow up as an outpatient: As needed.  For questions or updates, please contact [place] Please consult [email or web address] for contact info under        Signed, Sinclair Grooms, MD  11/06/2021, 12:00 PM

## 2021-11-06 NOTE — Progress Notes (Signed)
1 Day Post-Op  Subjective: No complaints today. Denies abdominal pain. States she would have surgery to remove gallbladder if she is deemed medically clear for surgery. Denies n/v or MEG at present  ROS: See above, otherwise other systems negative  Objective: Vital signs in last 24 hours: Temp:  [97.5 F (36.4 C)-98.5 F (36.9 C)] 98.4 F (36.9 C) (06/07 0838) Pulse Rate:  [66-81] 80 (06/07 0838) Resp:  [17-22] 17 (06/07 0838) BP: (117-141)/(56-73) 127/70 (06/07 0838) SpO2:  [91 %-98 %] 91 % (06/07 0838) Last BM Date : 11/04/21  Intake/Output from previous day: 06/06 0701 - 06/07 0700 In: 520 [P.O.:220; I.V.:100; IV Piggyback:200] Out: 500 [Urine:500] Intake/Output this shift: No intake/output data recorded.  PE: Gen: NAD Abd: soft, NT, ND  Lab Results:  Recent Labs    11/04/21 0101 11/06/21 0446  WBC 6.1 10.2  HGB 11.7* 11.2*  HCT 35.0* 34.4*  PLT 171 203   BMET Recent Labs    11/05/21 0442 11/06/21 0446  NA 140 134*  K 3.3* 4.4  CL 107 103  CO2 22 18*  GLUCOSE 93 165*  BUN 21 39*  CREATININE 1.33* 1.87*  CALCIUM 9.6 9.3   PT/INR Recent Labs    11/04/21 0101  LABPROT 12.7  INR 1.0   CMP     Component Value Date/Time   NA 134 (L) 11/06/2021 0446   K 4.4 11/06/2021 0446   CL 103 11/06/2021 0446   CO2 18 (L) 11/06/2021 0446   GLUCOSE 165 (H) 11/06/2021 0446   BUN 39 (H) 11/06/2021 0446   CREATININE 1.87 (H) 11/06/2021 0446   CALCIUM 9.3 11/06/2021 0446   PROT 6.1 (L) 11/06/2021 0446   ALBUMIN 2.9 (L) 11/06/2021 0446   AST 22 11/06/2021 0446   ALT 18 11/06/2021 0446   ALKPHOS 59 11/06/2021 0446   BILITOT 0.5 11/06/2021 0446   GFRNONAA 25 (L) 11/06/2021 0446   GFRAA 71 (L) 10/16/2011 0416   Lipase  No results found for: LIPASE     Studies/Results: DG ERCP  Result Date: 11/05/2021 CLINICAL DATA:  Choledocholithiasis EXAM: ERCP TECHNIQUE: Multiple spot images obtained with the fluoroscopic device and submitted for interpretation  post-procedure. FLUOROSCOPY: Radiation Exposure Index (as provided by the fluoroscopic device): 57.76 mGy Kerma COMPARISON:  MRCP 11/01/2021 FINDINGS: A total of 8 train intraoperative saved images are submitted for review. The images demonstrate a flexible duodenal scope in the descending duodenum with wire cannulation of the common bile duct. Cholangiogram demonstrates multiple faceted filling defects within the mid and distal common bile duct consistent with choledocholithiasis. Subsequent images confirm balloon sphincterotomy followed by balloon sweeping of the common duct. IMPRESSION: 1. Choledocholithiasis. 2. Balloon sphincterotomy and balloon sweeping of the common duct. These images were submitted for radiologic interpretation only. Please see the procedural report for the amount of contrast and the fluoroscopy time utilized. Electronically Signed   By: Jacqulynn Cadet M.D.   On: 11/05/2021 11:58    Anti-infectives: Anti-infectives (From admission, onward)    Start     Dose/Rate Route Frequency Ordered Stop   11/02/21 0900  cefTRIAXone (ROCEPHIN) 2 g in sodium chloride 0.9 % 100 mL IVPB  Status:  Discontinued        2 g 200 mL/hr over 30 Minutes Intravenous Every 24 hours 11/02/21 0814 11/02/21 1409   11/02/21 0830  metroNIDAZOLE (FLAGYL) IVPB 500 mg  Status:  Discontinued        500 mg 100 mL/hr over 60 Minutes Intravenous Every  12 hours 11/02/21 0814 11/02/21 1409        Assessment/Plan Choledocholithiasis -patient for ERCP today -we discussed extensively her 3 options for how to proceed post ERCP.  1. Lap chole  2. Go home and think about it and she could follow up as an outpatient if she would like to proceed with surgery in the future  3. ERCP with sphincterotomy and no surgery (no definitive tx but good tx for her age)    -she is interested in proceeding with surgery if medically clear; we have asked her primary service Dr. Tawanna Solo to assess for medical clearance for surgery,  if he feels necessary for cardiology consult to please obtain.  Noted plans for ERCP later today  FEN - per primary; if cleared for surgery, npo after midnight tonight please VTE - Lovenox ID - none currently  HLD ?forgetfulness GERD  I called and spoke over the phone with her son Legrand Como yesterday. We have also reviewed all the above, including etiology of gallstones, what choledocholithiasis represents and potential for surgery to reduce the chances of this occurring again.  -The anatomy and physiology of the hepatobiliary system was discussed at length with the patient with associated pictures. The pathophysiology of gallbladder disease was discussed at length as well with both her and her son Legrand Como. -The options for treatment were discussed as noted above. We reviewed laparoscopic cholecystectomy, scenarios where partial cholecystectomy may be needed as well. -The planned procedures, material risks (including, but not limited to, pain, bleeding, infection, scarring, need for blood transfusion, damage to surrounding structures- blood vessels/nerves/viscus/organs, damage to bile duct, bile leak, need for additional procedures, hernia, worsening of pre-existing medical conditions, pancreatitis, pneumonia, heart attack, stroke, death) benefits and alternatives to surgery were discussed at length. The patient's and her son's questions were answered to their satisfaction, they voiced understanding and they elected to proceed with surgery provided she is medically clear. Additionally, we discussed typical postoperative expectations and the recovery process.  -Plans underway with cardiology for cardiac clearance - planning Echo for further evaluation. If this is normal, they report she would be cleared for procedure. Will await their recs at this time.  I reviewed Consultant GI notes, hospitalist notes, last 24 h vitals and pain scores, last 48 h intake and output, last 24 h labs and trends, and  last 24 h imaging results.  I spent a total of 52 minutes in both face-to-face and non-face-to-face activities, excluding procedures performed, for this visit on the date of this encounter.   LOS: 6 days   Nadeen Landau, MD [facility], [place]

## 2021-11-06 NOTE — Transfer of Care (Signed)
Immediate Anesthesia Transfer of Care Note  Patient: JAMELL OPFER  Procedure(s) Performed: LAPAROSCOPIC CHOLECYSTECTOMY (Abdomen) Intraoperative cholangiogram with ICG  Patient Location: PACU  Anesthesia Type:General  Level of Consciousness: awake and patient cooperative  Airway & Oxygen Therapy: Patient Spontanous Breathing and Patient connected to nasal cannula oxygen  Post-op Assessment: Report given to RN and Post -op Vital signs reviewed and stable  Post vital signs: Reviewed and stable  Last Vitals:  Vitals Value Taken Time  BP 151/75   Temp    Pulse 74 11/06/21 1526  Resp 20 11/06/21 1526  SpO2 98 % 11/06/21 1526  Vitals shown include unvalidated device data.  Last Pain:  Vitals:   11/06/21 1250  TempSrc: Oral  PainSc: 0-No pain         Complications: No notable events documented.

## 2021-11-06 NOTE — Anesthesia Preprocedure Evaluation (Addendum)
Anesthesia Evaluation  Patient identified by MRN, date of birth, ID band Patient awake    Reviewed: Allergy & Precautions, NPO status , Patient's Chart, lab work & pertinent test results  History of Anesthesia Complications (+) PONV, MALIGNANT HYPERTHERMIA and history of anesthetic complications  Airway Mallampati: II  TM Distance: >3 FB     Dental   Pulmonary former smoker,    breath sounds clear to auscultation       Cardiovascular + dysrhythmias  Rhythm:Regular Rate:Normal  History noted Dr. Nyoka Cowden   Neuro/Psych  Headaches,  Neuromuscular disease    GI/Hepatic Neg liver ROS, GERD  ,  Endo/Other    Renal/GU Renal disease     Musculoskeletal   Abdominal   Peds  Hematology   Anesthesia Other Findings   Reproductive/Obstetrics                            Anesthesia Physical Anesthesia Plan  ASA: 3  Anesthesia Plan: General   Post-op Pain Management: Regional block*   Induction: Intravenous  PONV Risk Score and Plan: 4 or greater and Ondansetron, Dexamethasone and Midazolam  Airway Management Planned: Oral ETT  Additional Equipment:   Intra-op Plan:   Post-operative Plan: Extubation in OR  Informed Consent: I have reviewed the patients History and Physical, chart, labs and discussed the procedure including the risks, benefits and alternatives for the proposed anesthesia with the patient or authorized representative who has indicated his/her understanding and acceptance.     Dental advisory given  Plan Discussed with: CRNA and Anesthesiologist  Anesthesia Plan Comments:         Anesthesia Quick Evaluation

## 2021-11-06 NOTE — Progress Notes (Signed)
  Echocardiogram 2D Echocardiogram has been performed.  Leah [name] 11/06/2021, 11:11 AM

## 2021-11-06 NOTE — Progress Notes (Signed)
PROGRESS NOTE    Leah [name]  QMG:[id number] DOB: [birth year] DOA: 10/31/2021 PCP: Hoyt Koch, MD    Brief Narrative:  88 year old with history of GERD, chronic left bundle branch block presented with abnormal labs from PCP office.  Patient with recent history of poor oral intake and heaviness on her legs at home.  In the emergency room she was found with acute kidney injury, choledocholithiasis.   Assessment & Plan:   Cholelithiasis and choledocholithiasis: MRCP confirmed 14 mm stone, cholelithiasis with features of cholecystitis. ERCP 6/6, biliary sludge, biliary sphincterotomy.  LFTs improving. General surgery following, lap chole pending cardiac clearance today.  Completed antibiotic therapy.  Acute kidney injury on CKD stage IIIa: Baseline creatinine about 1.  Presentation creatinine 2.8.  Treated with IV fluids.  Renal functions fluctuate.  We will keep on IV fluids today.  History of left bundle branch block/cardiac clearance: No anginal symptoms.  Cardiology following.  Echocardiogram today before surgery.  Anticipate no contraindication to surgery.  Essential hypertension: Blood pressures improved after starting on amlodipine and hydralazine.  Not on treatment at home.     DVT prophylaxis: enoxaparin (LOVENOX) injection 30 mg Start: 11/05/21 1000   Code Status: Full code Family Communication: None today Disposition Plan: Status is: Inpatient Remains inpatient appropriate because: Inpatient procedures planned.  IV fluids.     Consultants:  Gastroenterology General surgery  Procedures:  ERCP  Antimicrobials:  Completed   Subjective: Patient seen and examined.  Without complaints.  However unhappy due to not going for surgery today.  Afebrile.  Denies any abdominal pain.  Objective: Vitals:   11/05/21 1517 11/05/21 2010 11/06/21 0530 11/06/21 0838  BP: (!) 141/67 127/66 137/73 127/70  Pulse: 77 81 77 80  Resp: '17 18 18 17  '$ Temp: 97.6 F  (36.4 C) 97.6 F (36.4 C) (!) 97.5 F (36.4 C) 98.4 F (36.9 C)  TempSrc: Oral Oral Oral Oral  SpO2: 92% 93% 96% 91%  Weight:      Height:        Intake/Output Summary (Last 24 hours) at 11/06/2021 1152 Last data filed at 11/06/2021 0534 Gross per 24 hour  Intake 320 ml  Output 500 ml  Net -180 ml   Filed Weights   11/02/21 0527 11/04/21 0532 11/05/21 0621  Weight: 62.5 kg 61.9 kg 59 kg    Examination:  General: Looks fairly comfortable. Cardiovascular: S1-S2 normal.  Regular rhythm. Respiratory: Bilateral clear.  No added sounds. Gastrointestinal: Soft.  Nontender.  Bowel sound present. Ext: No swelling or edema. Neuro: Intact. Musculoskeletal: No deformities.      Data Reviewed: I have personally reviewed following labs and imaging studies  CBC: Recent Labs  Lab 10/31/21 1017 10/31/21 2115 11/01/21 0302 11/04/21 0101 11/06/21 0446  WBC 5.7 6.6 6.0 6.1 10.2  NEUTROABS 3.7 3.7 3.0 3.6  --   HGB 11.7* 11.4* 10.6* 11.7* 11.2*  HCT 34.9* 34.4* 32.2* 35.0* 34.4*  MCV 89.1 90.8 90.2 89.3 89.8  PLT 188.0 200 167 171 326   Basic Metabolic Panel: Recent Labs  Lab 11/01/21 0302 11/02/21 0238 11/03/21 0350 11/04/21 0101 11/05/21 0442 11/06/21 0446  NA 143 139 141 142 140 134*  K 3.3* 3.6 3.5 3.3* 3.3* 4.4  CL 106 105 108 108 107 103  CO2 '29 26 24 25 22 '$ 18*  GLUCOSE 126* 147* 88 100* 93 165*  BUN 50* 45* 29* 31* 21 39*  CREATININE 2.74* 2.26* 1.60* 1.71* 1.33* 1.87*  CALCIUM 11.2* 10.3 9.4 9.9  9.6 9.3  MG 1.8  --   --   --   --   --   PHOS 4.1  --   --   --   --   --    GFR: Estimated Creatinine Clearance: 15.9 mL/min (A) (by C-G formula based on SCr of 1.87 mg/dL (H)). Liver Function Tests: Recent Labs  Lab 10/31/21 1017 11/01/21 0302 11/03/21 0350 11/04/21 0101 11/06/21 0446  AST '18 20 18 19 22  '$ ALT '16 15 13 15 18  '$ ALKPHOS 83 66 57 64 59  BILITOT 0.6 0.7 0.2* 0.6 0.5  PROT 6.8 5.7* 5.4* 6.1* 6.1*  ALBUMIN 3.8 3.1* 2.7* 3.1* 2.9*   No  results for input(s): LIPASE, AMYLASE in the last 168 hours. No results for input(s): AMMONIA in the last 168 hours. Coagulation Profile: Recent Labs  Lab 11/04/21 0101  INR 1.0   Cardiac Enzymes: No results for input(s): CKTOTAL, CKMB, CKMBINDEX, TROPONINI in the last 168 hours. BNP (last 3 results) Recent Labs    10/31/21 1017  PROBNP 145.0*   HbA1C: No results for input(s): HGBA1C in the last 72 hours. CBG: No results for input(s): GLUCAP in the last 168 hours. Lipid Profile: No results for input(s): CHOL, HDL, LDLCALC, TRIG, CHOLHDL, LDLDIRECT in the last 72 hours. Thyroid Function Tests: No results for input(s): TSH, T4TOTAL, FREET4, T3FREE, THYROIDAB in the last 72 hours. Anemia Panel: No results for input(s): VITAMINB12, FOLATE, FERRITIN, TIBC, IRON, RETICCTPCT in the last 72 hours. Sepsis Labs: No results for input(s): PROCALCITON, LATICACIDVEN in the last 168 hours.  Recent Results (from the past 240 hour(s))  Culture, blood (Routine X 2) w Reflex to ID Panel     Status: None (Preliminary result)   Collection Time: 11/02/21  9:48 AM   Specimen: BLOOD RIGHT HAND  Result Value Ref Range Status   Specimen Description BLOOD RIGHT HAND  Final   Special Requests   Final    BOTTLES DRAWN AEROBIC AND ANAEROBIC Blood Culture adequate volume   Culture   Final    NO GROWTH 4 DAYS Performed at [facility], [address] [place], [place] [address]    Report Status PENDING  Incomplete  Culture, blood (Routine X 2) w Reflex to ID Panel     Status: None (Preliminary result)   Collection Time: 11/02/21  9:48 AM   Specimen: BLOOD LEFT HAND  Result Value Ref Range Status   Specimen Description BLOOD LEFT HAND  Final   Special Requests   Final    BOTTLES DRAWN AEROBIC AND ANAEROBIC Blood Culture adequate volume   Culture   Final    NO GROWTH 4 DAYS Performed at [facility], [place] [address] [place], [place] [address]    Report Status PENDING  Incomplete   Surgical pcr screen     Status: None   Collection Time: 11/05/21  4:57 PM   Specimen: Nasal Mucosa; Nasal Swab  Result Value Ref Range Status   MRSA, PCR NEGATIVE NEGATIVE Final   Staphylococcus aureus NEGATIVE NEGATIVE Final    Comment: (NOTE) The Xpert SA Assay (FDA approved for NASAL specimens in patients 65 years of age and older), is one component of a comprehensive surveillance program. It is not intended to diagnose infection nor to guide or monitor treatment. Performed at [facility], [place] [address] [place], [place] [address]          Radiology Studies: DG ERCP  Result Date: 11/05/2021 CLINICAL  DATA:  Choledocholithiasis EXAM: ERCP TECHNIQUE: Multiple spot images obtained with the fluoroscopic device and submitted for interpretation post-procedure. FLUOROSCOPY: Radiation Exposure Index (as provided by the fluoroscopic device): 57.76 mGy Kerma COMPARISON:  MRCP 11/01/2021 FINDINGS: A total of 8 train intraoperative saved images are submitted for review. The images demonstrate a flexible duodenal scope in the descending duodenum with wire cannulation of the common bile duct. Cholangiogram demonstrates multiple faceted filling defects within the mid and distal common bile duct consistent with choledocholithiasis. Subsequent images confirm balloon sphincterotomy followed by balloon sweeping of the common duct. IMPRESSION: 1. Choledocholithiasis. 2. Balloon sphincterotomy and balloon sweeping of the common duct. These images were submitted for radiologic interpretation only. Please see the procedural report for the amount of contrast and the fluoroscopy time utilized. Electronically Signed   By: Jacqulynn Cadet M.D.   On: 11/05/2021 11:58   ECHOCARDIOGRAM COMPLETE  Result Date: 11/06/2021    ECHOCARDIOGRAM REPORT   Patient Name:   VELISA REGNIER Date of Exam: 11/06/2021 Medical Rec #:  [id number]         Height:       59.0 in Accession #:    [id number]        Weight:        130.1 lb Date of Birth:  1930-03-17        BSA:          1.536 m Patient Age:    25 years          BP:           137/73 mmHg Patient Gender: F                 HR:           76 bpm. Exam Location:  Inpatient Procedure: 2D Echo, 3D Echo, Cardiac Doppler and Color Doppler Indications:    ; Z01.818 Encounter for other preprocedural examination  History:        Patient has prior history of Echocardiogram examinations, most                 recent 11/23/2014. Abnormal ECG, Arrythmias:LBBB and AV block;                 Signs/Symptoms:Edema and Murmur.  Sonographer:    Roseanna Rainbow RDCS Referring Phys: [id number] [place]  1. Left ventricular ejection fraction, by estimation, is 50 to 55%. Left ventricular ejection fraction by 3D volume is 54 %. The left ventricle has low normal function. The left ventricle has no regional wall motion abnormalities. There is severe hypertrophy of the basal septal segment. The rest of the LV segments demonstrate mild concentric left ventricular hypertrophy. Left ventricular diastolic parameters are consistent with Grade I diastolic dysfunction (impaired relaxation). There is septal-lateral dyssynchrony due to LBBB.  2. Right ventricular systolic function is normal. The right ventricular size is normal. Tricuspid regurgitation signal is inadequate for assessing PA pressure.  3. Left atrial size was mildly dilated.  4. The mitral valve is grossly normal. Trivial mitral valve regurgitation. No evidence of mitral stenosis. Moderate mitral annular calcification.  5. The aortic valve is tricuspid. There is mild calcification of the aortic valve. There is mild thickening of the aortic valve. Aortic valve regurgitation is not visualized. Aortic valve sclerosis/calcification is present, without any evidence of aortic stenosis.  6. The inferior vena cava is dilated in size with >50% respiratory variability, suggesting right atrial pressure of 8 mmHg. Comparison(s): Compared to prior  TTE  report in 2016, the LVEF is slightly lower at 54% (previously 55-60%). Otherwise, there is no significant change. FINDINGS  Left Ventricle: Left ventricular ejection fraction, by estimation, is 50 to 55%. Left ventricular ejection fraction by 3D volume is 54 %. The left ventricle has low normal function. The left ventricle has no regional wall motion abnormalities. The left ventricular internal cavity size was normal in size. There is severe hypertrophy of the basal septal segment. The rest of the LV segments demonstrate mild concentric left ventricular hypertrophy. Abnormal (paradoxical) septal motion, consistent with left  bundle branch block. Left ventricular diastolic parameters are consistent with Grade I diastolic dysfunction (impaired relaxation). Right Ventricle: The right ventricular size is normal. No increase in right ventricular wall thickness. Right ventricular systolic function is normal. Tricuspid regurgitation signal is inadequate for assessing PA pressure. Left Atrium: Left atrial size was mildly dilated. Right Atrium: Right atrial size was normal in size. Pericardium: There is no evidence of pericardial effusion. Mitral Valve: The mitral valve is grossly normal. There is mild thickening of the mitral valve leaflet(s). There is mild calcification of the mitral valve leaflet(s). Moderate mitral annular calcification. Trivial mitral valve regurgitation. No evidence of mitral valve stenosis. MV peak gradient, 10.1 mmHg. The mean mitral valve gradient is 3.0 mmHg. Tricuspid Valve: The tricuspid valve is normal in structure. Tricuspid valve regurgitation is trivial. Aortic Valve: The aortic valve is tricuspid. There is mild calcification of the aortic valve. There is mild thickening of the aortic valve. Aortic valve regurgitation is not visualized. Aortic valve sclerosis/calcification is present, without any evidence of aortic stenosis. Pulmonic Valve: The pulmonic valve was normal in structure. Pulmonic  valve regurgitation is trivial. Aorta: The aortic root and ascending aorta are structurally normal, with no evidence of dilitation. Venous: The inferior vena cava is dilated in size with greater than 50% respiratory variability, suggesting right atrial pressure of 8 mmHg. IAS/Shunts: The atrial septum is grossly normal.  LEFT VENTRICLE PLAX 2D LVIDd:         2.60 cm         Diastology LVIDs:         2.10 cm         LV e' medial:    4.57 cm/s LV PW:         1.90 cm         LV E/e' medial:  16.6 LV IVS:        1.80 cm         LV e' lateral:   7.29 cm/s LVOT diam:     2.10 cm         LV E/e' lateral: 10.4 LV SV:         83 LV SV Index:   54 LVOT Area:     3.46 cm        3D Volume EF                                LV 3D EF:    Left                                             ventricul LV Volumes (MOD)  ar LV vol d, MOD    68.0 ml                    ejection A2C:                                        fraction LV vol d, MOD    60.6 ml                    by 3D A4C:                                        volume is LV vol s, MOD    28.2 ml                    54 %. A2C: LV vol s, MOD    27.7 ml A4C:                           3D Volume EF: LV SV MOD A2C:   39.8 ml       3D EF:        54 % LV SV MOD A4C:   60.6 ml       LV EDV:       121 ml LV SV MOD BP:    35.8 ml       LV ESV:       56 ml                                LV SV:        65 ml RIGHT VENTRICLE             IVC RV S prime:     10.70 cm/s  IVC diam: 2.60 cm TAPSE (M-mode): 2.9 cm LEFT ATRIUM             Index        RIGHT ATRIUM           Index LA diam:        3.50 cm 2.28 cm/m   RA Area:     11.90 cm LA Vol (A2C):   54.8 ml 35.67 ml/m  RA Volume:   25.90 ml  16.86 ml/m LA Vol (A4C):   28.9 ml 18.81 ml/m LA Biplane Vol: 40.8 ml 26.56 ml/m  AORTIC VALVE LVOT Vmax:   132.00 cm/s LVOT Vmean:  82.300 cm/s LVOT VTI:    0.239 m  AORTA Ao Root diam: 3.30 cm Ao Asc diam:  3.10 cm MITRAL VALVE MV Area (PHT): 3.95 cm     SHUNTS MV Area VTI:    2.96 cm     Systemic VTI:  0.24 m MV Peak grad:  10.1 mmHg    Systemic Diam: 2.10 cm MV Mean grad:  3.0 mmHg MV Vmax:       1.59 m/s MV Vmean:      87.4 cm/s MV Decel Time: 192 msec MV E velocity: 76.00 cm/s MV A velocity: 123.50 cm/s MV E/A ratio:  0.62 Gwyndolyn Kaufman MD Electronically signed by Gwyndolyn Kaufman MD Signature Date/Time: 11/06/2021/11:32:18 AM    Final  Scheduled Meds:  amLODipine  10 mg Oral Daily   aspirin EC  81 mg Oral Daily   enoxaparin (LOVENOX) injection  30 mg Subcutaneous Q24H   hydrALAZINE  100 mg Oral Q8H   latanoprost  1 drop Both Eyes QHS   pantoprazole  40 mg Oral BID   timolol  1 drop Both Eyes BID   Continuous Infusions:  sodium chloride       LOS: 6 days    Time spent: 35 minutes    Barb Merino, MD [facility] Pager [phone]

## 2021-11-06 NOTE — Anesthesia Postprocedure Evaluation (Signed)
Anesthesia Post Note  Patient: Leah [name]  Procedure(s) Performed: LAPAROSCOPIC CHOLECYSTECTOMY (Abdomen) Intraoperative cholangiogram with ICG     Patient location during evaluation: PACU Anesthesia Type: General Level of consciousness: awake and alert Pain management: pain level controlled Vital Signs Assessment: post-procedure vital signs reviewed and stable Respiratory status: spontaneous breathing, nonlabored ventilation, respiratory function stable and patient connected to nasal cannula oxygen Cardiovascular status: blood pressure returned to baseline and stable Postop Assessment: no apparent nausea or vomiting Anesthetic complications: no   No notable events documented.  Last Vitals:  Vitals:   11/06/21 1540 11/06/21 1555  BP: (!) 157/75 (!) 149/78  Pulse: 73 96  Resp: (!) 22 16  Temp:  36.6 C  SpO2: 98% 93%    Last Pain:  Vitals:   11/06/21 1555  TempSrc:   PainSc: 0-No pain                 Dora Clauss,W. EDMOND

## 2021-11-06 NOTE — Anesthesia Procedure Notes (Signed)
Procedure Name: Intubation Date/Time: 11/06/2021 1:54 PM Performed by: Betha Loa, CRNA Pre-anesthesia Checklist: Patient identified, Emergency Drugs available, Suction available and Patient being monitored Patient Re-evaluated:Patient Re-evaluated prior to induction Oxygen Delivery Method: Circle System Utilized Preoxygenation: Pre-oxygenation with 100% oxygen Induction Type: IV induction Ventilation: Mask ventilation without difficulty Laryngoscope Size: Mac and 3 Grade View: Grade II Tube type: Oral Tube size: 7.0 mm Number of attempts: 1 Airway Equipment and Method: Stylet and Oral airway Placement Confirmation: ETT inserted through vocal cords under direct vision, positive ETCO2 and breath sounds checked- equal and bilateral Secured at: 21 cm Tube secured with: Tape Dental Injury: Teeth and Oropharynx as per pre-operative assessment

## 2021-11-06 NOTE — Op Note (Signed)
11/06/2021 3:13 PM  PATIENT: Leah [name]  88 y.o. female  Patient Care Team: Hoyt Koch, MD as PCP - General (Internal Medicine) Fontaine, Belinda Block, MD (Inactive) as Consulting Physician (Gynecology) Marygrace Drought, MD as Consulting Physician (Ophthalmology) Ladene Artist, MD as Consulting Physician (Gastroenterology)  PRE-OPERATIVE DIAGNOSIS: Choledocholithiasis  POST-OPERATIVE DIAGNOSIS: Same with chronic cholecystitis  PROCEDURE: Laparoscopic cholecystectomy with indocyanine green cholangiography  SURGEON: Sharon Mt. Keylen Eckenrode, MD  ANESTHESIA: General endotracheal  EBL: Total I/O In: -  Out: 10 [Blood:10]  DRAINS: None  SPECIMEN: Gallbladder  COUNTS: Sponge, needle and instrument counts were reported correct x2 at the conclusion of the operation  DISPOSITION: PACU in satisfactory condition  COMPLICATIONS: none  FINDINGS: Likely prior or subacute cholecystitis given fibrosis around infundibulum.  She also had filmy type adhesions of her duodenum and omentum to her gallbladder.  ICG cholangiography demonstrated backfilling of her gallbladder and faint tracer within her duodenum.  Laparoscopic cholecystectomy was carried out.  DESCRIPTION:  The patient was identified & brought into the operating room. She was then positioned supine on the OR table. SCDs were in place and active during the entire case. She then underwent general endotracheal anesthesia. Pressure points were padded. Hair on the abdomen was clipped by the OR team. The abdomen was prepped and draped in the standard sterile fashion. Antibiotics were administered. A surgical timeout was performed and confirmed our plan.   An infraumbilical incision was made. The umbilical stalk was grasped and retracted outwardly. The infraumbilical fascia was identified and incised. The peritoneal cavity was gently entered bluntly. A purse-string 0 Vicryl suture was placed. The Hasson cannula was inserted into  the peritoneal cavity and insufflation with CO2 commenced to 101mHg. A laparoscope was inserted into the peritoneal cavity and inspection confirmed no evidence of trocar site complications. The patient was then positioned in reverse Trendelenburg with slight left side down. 3 additional 5 mm trocars were placed along the right subcostal line - one 519mport in mid subcostal region, another 6m60mort in the right flank near the anterior axillary line, and a third 6mm23mrt in the left subxiphoid region obliquely near the falciform ligament.  The liver and gallbladder were inspected.  There are filmy type adhesions between her gallbladder body/infundibulum and her duodenum.  There is also some omental containing adhesions.  These are carefully taken down sharply after retracting the gallbladder anteriorly.  The duodenum was evaluated and found to be free of any associated injury. The gallbladder fundus was grasped and elevated cephalad. An additional grasper was then placed on the infundibulum of the gallbladder and the infundibulum was retracted laterally. Staying high on the gallbladder, the peritoneum on both sides of the gallbladder was opened with hook cautery. Gentle blunt dissection was then employed with a MaryIT consultantking down into [place] cystic duct was identified and carefully circumferentially dissected.  This was folded back on the infundibulum.  The cystic artery was also identified in its typical anterior location and carefully circumferentially dissected. The space between the cystic artery and hepatocystic plate was developed such that a good view of the liver could be seen through a window medial to the cystic artery. The triangle of Calot had been cleared of all fibrofatty tissue. At this point, a critical view of safety was achieved and the only structures visualized was the skeletonized cystic duct laterally, the skeletonized cystic artery and the liver through the window  medial to the artery. No posterior cystic artery was  noted  At this point, attention was turned to performing ICG cholangiography.  Under near infrared fluorescence, there is evident tracer within the liver, gallbladder, cystic duct and apparent common bile duct.  The common bile duct has not been dissected.  There is also faint ICG activity seen within the proximal duodenum.  This is consistent with a normal 'ICG cholangiogram.'  The cystic duct and artery were clipped with 2 clips on the patient side and 1 clip on the specimen side. The cystic duct and artery were then divided. The gallbladder was then freed from its remaining attachments to the liver using electrocautery and placed into an endocatch bag. The RUQ was gently irrigated with sterile saline. Hemostasis was then verified. The clips were in good position; the gallbladder fossa was dry. The rest of the abdomen was inspected no injury nor bleeding elsewhere was identified.  The endocatch bag containing the gallbladder was then removed from the umbilical port site and passed off as specimen. The RUQ ports were removed under direct visualization and noted to be hemostatic. The umbilical fascia was then closed using the 0 Vicryl purse-string suture. The fascia was palpated and noted to be completely closed. The skin of all incision sites was approximated with 4-0 monocryl subcuticular suture and dermabond applied. She was then awakened from anesthesia, extubated, and transferred to a stretcher for transport to PACU in satisfactory condition.

## 2021-11-06 NOTE — TOC Progression Note (Signed)
Transition of Care [facility]) - Progression Note    Patient Details  Name: Leah [name] MRN: [id number] Date of Birth: February 11, 1930  [facility] CM/SW Contact  Tom-Johnson, Renea Ee, RN Phone Number: 11/06/2021, 4:14 PM  Clinical Narrative:     Laparoscopic cholecystectomy done today by Gen Sx. No needs noted. CM will continue to follow with needs.        Expected Discharge Plan and Services                                                 Social Determinants of Health (SDOH) Interventions    Readmission Risk Interventions     View : No data to display.

## 2021-11-06 NOTE — Progress Notes (Addendum)
Daily Rounding Note  11/06/2021, 10:54 AM  LOS: 6 days   SUBJECTIVE:   Chief complaint:   Nonobstructing choledocholithiasis.  Status post ERCP.  Gallstones.  Patient admits to being angry that she is not having surgery today because she had to have echocardiogram for cardiac clearance.  Annoyed w waiting for almost a week to get to the point where she is at and not yet cleared for lap chole.  She is NPO.  Echocardiogram was just completed. No abdominal pain, no nausea or vomiting.  She is hungry.    OBJECTIVE:         Vital signs in last 24 hours:    Temp:  [97.5 F (36.4 C)-98.5 F (36.9 C)] 98.4 F (36.9 C) (06/07 0838) Pulse Rate:  [66-81] 80 (06/07 0838) Resp:  [17-22] 17 (06/07 0838) BP: (117-141)/(56-73) 127/70 (06/07 0838) SpO2:  [91 %-98 %] 91 % (06/07 0838) Last BM Date : 11/04/21 Filed Weights   11/02/21 0527 11/04/21 0532 11/05/21 3825  Weight: 62.5 kg 61.9 kg 59 kg   General: Looks well.  Alert.  Comfortable.  Did not examine patient other than visually. Heart:  Chest: No labored breathing or cough Abdomen: Not distended Extremities: No CCE Neuro/Psych: Alert.  Oriented x3.  No gross deficits.  No tremors  Intake/Output from previous day: 06/06 0701 - 06/07 0700 In: 520 [P.O.:220; I.V.:100; IV Piggyback:200] Out: 500 [Urine:500]  Intake/Output this shift: No intake/output data recorded.  Lab Results: Recent Labs    11/04/21 0101 11/06/21 0446  WBC 6.1 10.2  HGB 11.7* 11.2*  HCT 35.0* 34.4*  PLT 171 203   BMET Recent Labs    11/04/21 0101 11/05/21 0442 11/06/21 0446  NA 142 140 134*  K 3.3* 3.3* 4.4  CL 108 107 103  CO2 25 22 18*  GLUCOSE 100* 93 165*  BUN 31* 21 39*  CREATININE 1.71* 1.33* 1.87*  CALCIUM 9.9 9.6 9.3   LFT Recent Labs    11/04/21 0101 11/06/21 0446  PROT 6.1* 6.1*  ALBUMIN 3.1* 2.9*  AST 19 22  ALT 15 18  ALKPHOS 64 59  BILITOT 0.6 0.5    PT/INR Recent Labs    11/04/21 0101  LABPROT 12.7  INR 1.0   Hepatitis Panel No results for input(s): HEPBSAG, HCVAB, HEPAIGM, HEPBIGM in the last 72 hours.  Studies/Results: DG ERCP  Result Date: 11/05/2021 CLINICAL DATA:  Choledocholithiasis EXAM: ERCP TECHNIQUE: Multiple spot images obtained with the fluoroscopic device and submitted for interpretation post-procedure. FLUOROSCOPY: Radiation Exposure Index (as provided by the fluoroscopic device): 57.76 mGy Kerma COMPARISON:  MRCP 11/01/2021 FINDINGS: A total of 8 train intraoperative saved images are submitted for review. The images demonstrate a flexible duodenal scope in the descending duodenum with wire cannulation of the common bile duct. Cholangiogram demonstrates multiple faceted filling defects within the mid and distal common bile duct consistent with choledocholithiasis. Subsequent images confirm balloon sphincterotomy followed by balloon sweeping of the common duct. IMPRESSION: 1. Choledocholithiasis. 2. Balloon sphincterotomy and balloon sweeping of the common duct. These images were submitted for radiologic interpretation only. Please see the procedural report for the amount of contrast and the fluoroscopy time utilized. Electronically Signed   By: Jacqulynn Cadet M.D.   On: 11/05/2021 11:58    Scheduled Meds:  amLODipine  10 mg Oral Daily   aspirin EC  81 mg Oral Daily   enoxaparin (LOVENOX) injection  30 mg Subcutaneous Q24H  hydrALAZINE  100 mg Oral Q8H   latanoprost  1 drop Both Eyes QHS   pantoprazole  40 mg Oral BID   timolol  1 drop Both Eyes BID   Continuous Infusions: PRN Meds:.acetaminophen, labetalol, melatonin, ondansetron (ZOFRAN) IV, polyethylene glycol  ASSESMENT:   Non-obstructing choledocholithiasis.  No elevation t bili, alk phos, transaminases.   11/05/2021 ERCP: Mild narrowing from Schatzki's ring at distal esophagus.  4 cm HH.  Gastric erythema, biopsied.  Major papilla within duodenal  diverticulum.  Cholangiogram with filling defects of stones/sludge, moderate CBD dilatation.  Biliary shelf/fibrotic stricture proximal to biliary orifice at distal third of main bile duct, after dilation this had resolved.  Sphincterotomy performed and biliary sludge and stones completely removed with balloon sweep. Dr. Dema Severin in process of obtaining cardiac clearance for laparoscopic cholecystectomy.  Echocardiogram completed within the last hour.    AKI.     PLAN     GI signing off.  Available prn.      Azucena Freed  11/06/2021, 10:54 AM Phone [phone]

## 2021-11-06 NOTE — Discharge Instructions (Signed)
[facility]  Please arrive at least 30 min before your appointment to complete your check in paperwork.  If you are unable to arrive 30 min prior to your appointment time we may have to cancel or reschedule you. LAPAROSCOPIC SURGERY: POST OP INSTRUCTIONS Always review your discharge instruction sheet given to you by the facility where your surgery was performed. IF YOU HAVE DISABILITY OR FAMILY LEAVE FORMS, YOU MUST BRING THEM TO THE OFFICE FOR PROCESSING.   DO NOT GIVE THEM TO YOUR DOCTOR.  PAIN CONTROL  First take acetaminophen (Tylenol) to control your pain after surgery.  Follow directions on package.  Taking acetaminophen (Tylenol) regularly after surgery will help to control your pain and lower the amount of prescription pain medication you may need.  You should not take more than 3,000 mg (3 grams) of acetaminophen (Tylenol) in 24 hours.  You should not take ibuprofen (Advil), aleve, motrin, naprosyn or other NSAIDS if you have a history of stomach ulcers or chronic kidney disease.  A prescription for pain medication may be given to you upon discharge.  Take your pain medication as prescribed, if you still have uncontrolled pain after taking acetaminophen (Tylenol).  Use ice packs to help control pain. If you need a refill on your pain medication, please contact your pharmacy.  They will contact our office to request authorization. Prescriptions will not be filled after 5pm or on week-ends.  HOME MEDICATIONS Take your usually prescribed medications unless otherwise directed.  DIET You should follow a light diet the first few days after arrival home.  Be sure to include lots of fluids daily. Avoid fatty, fried foods.   CONSTIPATION It is common to experience some constipation after surgery and if you are taking pain medication.  Increasing fluid intake and taking a stool softener (such as Colace) will usually help or prevent this problem from occurring.  A mild  laxative (Milk of Magnesia or Miralax) should be taken according to package instructions if there are no bowel movements after 48 hours.  WOUND/INCISION CARE Most patients will experience some swelling and bruising in the area of the incisions.  Ice packs will help.  Swelling and bruising can take several days to resolve.  Unless discharge instructions indicate otherwise, follow guidelines below  STERI-STRIPS - you may remove your outer bandages 48 hours after surgery, and you may shower at that time.  You have steri-strips (small skin tapes) in place directly over the incision.  These strips should be left on the skin for 7-10 days.   DERMABOND/SKIN GLUE - you may shower in 24 hours.  The glue will flake off over the next 2-3 weeks. Any sutures or staples will be removed at the office during your follow-up visit.  ACTIVITIES You may resume regular (light) daily activities beginning the next day--such as daily self-care, walking, climbing stairs--gradually increasing activities as tolerated.  You may have sexual intercourse when it is comfortable.  Refrain from any heavy lifting or straining until approved by your doctor. You may drive when you are no longer taking prescription pain medication, you can comfortably wear a seatbelt, and you can safely maneuver your car and apply brakes.  FOLLOW-UP You should see your doctor in the office for a follow-up appointment approximately 2-3 weeks after your surgery.  You should have been given your post-op/follow-up appointment when your surgery was scheduled.  If you did not receive a post-op/follow-up appointment, make sure that you call for this appointment within a   day or two after you arrive home to insure a convenient appointment time.   WHEN TO CALL YOUR DOCTOR: Fever over 101.0 Inability to urinate Continued bleeding from incision. Increased pain, redness, or drainage from the incision. Increasing abdominal pain  The clinic staff is available  to answer your questions during regular business hours.  Please don't hesitate to call and ask to speak to one of the nurses for clinical concerns.  If you have a medical emergency, go to the nearest emergency room or call 911.  A surgeon from [facility] is always on call at the hospital. [address], [address], [place], [place]  [address] ? [address] [address], [place], [place]   [address] [phone] ? [phone] ? FAX [phone]  

## 2021-11-06 NOTE — Progress Notes (Signed)
  Mobility Specialist Criteria Algorithm Info.    11/06/21 1450  Mobility  Activity Off unit (at procedure)   Will f/u as time permits.  11/06/2021 2:50 PM  [place] Alveria Mcglaughlin, [place], [place]  RDEYC:[phone] Office: [phone]

## 2021-11-07 ENCOUNTER — Encounter (HOSPITAL_COMMUNITY): Payer: Self-pay | Admitting: Surgery

## 2021-11-07 ENCOUNTER — Encounter: Payer: Self-pay | Admitting: Gastroenterology

## 2021-11-07 DIAGNOSIS — N179 Acute kidney failure, unspecified: Secondary | ICD-10-CM | POA: Diagnosis not present

## 2021-11-07 DIAGNOSIS — K8050 Calculus of bile duct without cholangitis or cholecystitis without obstruction: Secondary | ICD-10-CM | POA: Diagnosis not present

## 2021-11-07 LAB — CBC WITH DIFFERENTIAL/PLATELET
Abs Immature Granulocytes: 0.04 10*3/uL (ref 0.00–0.07)
Basophils Absolute: 0 10*3/uL (ref 0.0–0.1)
Basophils Relative: 0 %
Eosinophils Absolute: 0 10*3/uL (ref 0.0–0.5)
Eosinophils Relative: 0 %
HCT: 31.9 % — ABNORMAL LOW (ref 36.0–46.0)
Hemoglobin: 10.6 g/dL — ABNORMAL LOW (ref 12.0–15.0)
Immature Granulocytes: 0 %
Lymphocytes Relative: 7 %
Lymphs Abs: 0.7 10*3/uL (ref 0.7–4.0)
MCH: 29.9 pg (ref 26.0–34.0)
MCHC: 33.2 g/dL (ref 30.0–36.0)
MCV: 90.1 fL (ref 80.0–100.0)
Monocytes Absolute: 0.2 10*3/uL (ref 0.1–1.0)
Monocytes Relative: 2 %
Neutro Abs: 8.7 10*3/uL — ABNORMAL HIGH (ref 1.7–7.7)
Neutrophils Relative %: 91 %
Platelets: 200 10*3/uL (ref 150–400)
RBC: 3.54 MIL/uL — ABNORMAL LOW (ref 3.87–5.11)
RDW: 13.7 % (ref 11.5–15.5)
WBC: 9.7 10*3/uL (ref 4.0–10.5)
nRBC: 0 % (ref 0.0–0.2)

## 2021-11-07 LAB — CULTURE, BLOOD (ROUTINE X 2)
Culture: NO GROWTH
Culture: NO GROWTH
Special Requests: ADEQUATE
Special Requests: ADEQUATE

## 2021-11-07 LAB — COMPREHENSIVE METABOLIC PANEL
ALT: 27 U/L (ref 0–44)
AST: 29 U/L (ref 15–41)
Albumin: 2.5 g/dL — ABNORMAL LOW (ref 3.5–5.0)
Alkaline Phosphatase: 56 U/L (ref 38–126)
Anion gap: 7 (ref 5–15)
BUN: 38 mg/dL — ABNORMAL HIGH (ref 8–23)
CO2: 21 mmol/L — ABNORMAL LOW (ref 22–32)
Calcium: 9.1 mg/dL (ref 8.9–10.3)
Chloride: 110 mmol/L (ref 98–111)
Creatinine, Ser: 1.56 mg/dL — ABNORMAL HIGH (ref 0.44–1.00)
GFR, Estimated: 32 mL/min — ABNORMAL LOW (ref >60)
Glucose, Bld: 176 mg/dL — ABNORMAL HIGH (ref 70–99)
Potassium: 4.2 mmol/L (ref 3.5–5.1)
Sodium: 138 mmol/L (ref 135–145)
Total Bilirubin: 0.4 mg/dL (ref 0.3–1.2)
Total Protein: 5.6 g/dL — ABNORMAL LOW (ref 6.5–8.1)

## 2021-11-07 LAB — SURGICAL PATHOLOGY

## 2021-11-07 NOTE — Evaluation (Signed)
Occupational Therapy Evaluation Patient Details Name: Leah [name] MRN: [id number] DOB: [birth year] Today's Date: 11/07/2021   History of Present Illness Pt is an 88 y/o female admitted 6/1 secondary to abnormal labs and not feeling well. Found to have Cholelithiasis and choledocholithiasis. Pt is s/p ERCP on 6/6 and lap chole on 6/7. PMH includes HTN and CKD.   Clinical Impression   Patient admitted for the diagnosis above.  PTA she lives alone with a PCA that will assist with home management and driving.  Patient's children live out of town, but she has friends that assist as needed.  Currently she is needing up to [place] for lower body ADL from sit/stand level, and Min A for in room mobility.  Deficits impacting independence are listed below, generalized weakness and poor dynamic stand balance are primary.  OT will follow in the acute setting, with [facility] OT recommended for post acute rehab.        Recommendations for follow up therapy are one component of a multi-disciplinary discharge planning process, led by the attending physician.  Recommendations may be updated based on patient status, additional functional criteria and insurance authorization.   Follow Up Recommendations  Home health OT    Assistance Recommended at Discharge Intermittent Supervision/Assistance  Patient can return home with the following      Functional Status Assessment  Patient has had a recent decline in their functional status and demonstrates the ability to make significant improvements in function in a reasonable and predictable amount of time.  Equipment Recommendations  None recommended by OT    Recommendations for Other Services       Precautions / Restrictions Precautions Precautions: Fall Restrictions Weight Bearing Restrictions: No Other Position/Activity Restrictions: Abdominal Incision      Mobility Bed Mobility               General bed mobility comments: up in recliner     Transfers Overall transfer level: Needs assistance Equipment used: Straight cane Transfers: Sit to/from Stand Sit to Stand: Min guard                  Balance Overall balance assessment: Needs assistance Sitting-balance support: No upper extremity supported, Feet supported Sitting balance-Leahy Scale: Good     Standing balance support: Single extremity supported Standing balance-Leahy Scale: Poor Standing balance comment: Reliant on BUE support, SPC and HHA                           ADL either performed or assessed with clinical judgement   ADL       Grooming: Wash/dry hands;Oral care;Standing;Set up           Upper Body Dressing : Supervision/safety;Sitting   Lower Body Dressing: Minimal assistance;Sit to/from stand Lower Body Dressing Details (indicate cue type and reason): unable to reach feet Toilet Transfer: Minimal assistance;Regular Toilet;Ambulation Toilet Transfer Details (indicate cue type and reason): SPC in room                 Vision Baseline Vision/History: 1 Wears glasses Patient Visual Report: No change from baseline       Perception Perception Perception: Within Functional Limits   Praxis Praxis Praxis: Intact    Pertinent Vitals/Pain Pain Assessment Pain Assessment: Faces Faces Pain Scale: Hurts a little bit Pain Location: R abdomen Pain Descriptors / Indicators: Guarding Pain Intervention(s): Monitored during session     Hand Dominance Right   Extremity/Trunk Assessment  Upper Extremity Assessment Upper Extremity Assessment: Generalized weakness   Lower Extremity Assessment Lower Extremity Assessment: Defer to PT evaluation   Cervical / Trunk Assessment Cervical / Trunk Assessment: Kyphotic   Communication Communication Communication: No difficulties   Cognition Arousal/Alertness: Awake/alert Behavior During Therapy: WFL for tasks assessed/performed Overall Cognitive Status: Within Functional Limits  for tasks assessed                                 General Comments: very verbose, ST memory dieficts noted, but functional.     General Comments   VSS on RA    Exercises     Shoulder Instructions      Home Living Family/patient expects to be discharged to:: Private residence Living Arrangements: Alone Available Help at Discharge: Personal care attendant Type of Home: House Home Access: Stairs to enter [employer] of Steps: 6 Entrance Stairs-Rails: Right;Left;Can reach both Home Layout: Multi-level Alternate Level Stairs-Number of Steps: 4 Alternate Level Stairs-Rails: Right;Left;Can reach both Bathroom Shower/Tub: Occupational psychologist: Standard     Home Equipment: Conservation officer, nature (2 wheels);Cane - single point;Cane - quad;Grab bars - tub/shower          Prior Functioning/Environment Prior Level of Function : Independent/Modified Independent;Driving             Mobility Comments: Uses cane for mobility ADLs Comments: Has been independent with ADLs/iADL, light home mangement and meal prep. Caregiver sometimes drives longer distances.  Manages her finances and medications        OT Problem List: Decreased strength;Decreased activity tolerance;Impaired balance (sitting and/or standing);Decreased safety awareness      OT Treatment/Interventions: Self-care/ADL training;Therapeutic exercise;Therapeutic activities;Patient/family education;Balance training    OT Goals(Current goals can be found in the care plan section) Acute Rehab OT Goals Patient Stated Goal: Return home OT Goal Formulation: With patient Time For Goal Achievement: 11/21/21 Potential to Achieve Goals: Good ADL Goals Pt Will Perform Grooming: with modified independence;standing Pt Will Perform Lower Body Dressing: with modified independence;sit to/from stand Pt Will Transfer to Toilet: with modified independence;ambulating;regular height toilet Pt/caregiver  will Perform Home Exercise Program: Increased strength;Both right and left upper extremity;With theraband;With Supervision  OT Frequency: Min 2X/week    Co-evaluation              AM-PAC OT "6 Clicks" Daily Activity     Outcome Measure Help from another person eating meals?: None Help from another person taking care of personal grooming?: A Little Help from another person toileting, which includes using toliet, bedpan, or urinal?: A Little Help from another person bathing (including washing, rinsing, drying)?: A Little Help from another person to put on and taking off regular upper body clothing?: None Help from another person to put on and taking off regular lower body clothing?: A Little 6 Click Score: 20   End of Session Equipment Utilized During Treatment: Gait belt Nurse Communication: Mobility status  Activity Tolerance: Patient tolerated treatment well Patient left: in chair;with call bell/phone within reach  OT Visit Diagnosis: Unsteadiness on feet (R26.81);Muscle weakness (generalized) (M62.81)                Time: 1100-1118 OT Time Calculation (min): 18 min Charges:  OT General Charges $OT Visit: 1 Visit OT Evaluation $OT Eval Moderate Complexity: 1 Mod  11/07/2021  RP, OTR/L  Acute Rehabilitation Services  Office:  [phone]   Metta Clines 11/07/2021, 12:48 PM

## 2021-11-07 NOTE — TOC Progression Note (Signed)
Transition of Care [facility]) - Progression Note    Patient Details  Name: HESSIE VARONE MRN: [id number] Date of Birth: 03/14/1930  [facility] CM/SW Contact  Tom-Johnson, Renea Ee, RN Phone Number: 11/07/2021, 3:16 PM  Clinical Narrative:     CM spoke with patient at bedside about PT/OT home health recommendations. Patient states she was active with [facility] and would like to continue with their disciplines. CM notified Tommi Rumps and acceptance voiced. Info on AVS.  Patient voiced concerns about going home alone. Would like to stay in the hospital until she feels she is ready to go home. CM explained to patient the appeal process if she is discharged by MD and does not feel like going home. Patient voiced understanding. Patient states she has friends that she will talk to and see if they could stay with her until she recovers. Patient's two sons lives out of state.  CM advised patient to talk with sons about her disposition and to get back with CM. CM will continue to follow with needs.        Expected Discharge Plan and Services                                                 Social Determinants of Health (SDOH) Interventions    Readmission Risk Interventions     No data to display

## 2021-11-07 NOTE — Progress Notes (Signed)
PROGRESS NOTE    Leah [name]  TGG:[id number] DOB: June 26, 1929 DOA: 10/31/2021 PCP: Hoyt Koch, MD    Brief Narrative:  88 year old with history of GERD, chronic left bundle branch block presented with abnormal labs from PCP office.  Patient with recent history of poor oral intake and heaviness on her legs at home.  In the emergency room she was found with acute kidney injury, choledocholithiasis.   Assessment & Plan:   Cholelithiasis and choledocholithiasis: MRCP confirmed 14 mm stone, cholelithiasis with features of cholecystitis. ERCP 6/6, biliary sludge, biliary sphincterotomy.  LFTs improving and normalized. 6/7, lap chole.  Stabilized as per surgery.  Completed antibiotic therapy.  Acute kidney injury on CKD stage IIIa: Baseline creatinine about 1.  Presentation creatinine 2.8.  Treated with IV fluids.  Renal functions improving.  Discontinue IV fluids.  Encourage oral intake.  History of left bundle branch block/cardiac clearance: No anginal symptoms.  Stable.  Essential hypertension: Blood pressures improved after starting on amlodipine and hydralazine.  Not on treatment at home.  Debility and deconditioning: Work with PT OT and improve mobility before discharge home.   DVT prophylaxis: enoxaparin (LOVENOX) injection 30 mg Start: 11/05/21 1000   Code Status: Full code Family Communication: None today Disposition Plan: Status is: Inpatient Remains inpatient appropriate because: Pending PT OT and safe disposition plans.     Consultants:  Gastroenterology General surgery  Procedures:  ERCP Lap chole  Antimicrobials:  Completed   Subjective:  Patient seen and examined.  Denies any abdominal pain.  Tolerating liquids.  No bowel movement since last 3 days, however passing flatus. Lives alone, she is worried about how she is going to walk.  Wants to get comfortable getting around before going home.  Objective: Vitals:   11/06/21 2034 11/06/21  2315 11/07/21 0439 11/07/21 0922  BP: (!) 144/62 (!) 143/84 138/70 138/75  Pulse: 89 89 85 84  Resp: '18 18 18 18  '$ Temp: 98.3 F (36.8 C) 98.8 F (37.1 C) 98.6 F (37 C) 97.8 F (36.6 C)  TempSrc: Oral Oral Oral Oral  SpO2: 96% 97% 94% 95%  Weight:   61.1 kg   Height:        Intake/Output Summary (Last 24 hours) at 11/07/2021 1110 Last data filed at 11/07/2021 0500 Gross per 24 hour  Intake 1051.58 ml  Output 2110 ml  Net -1058.42 ml   Filed Weights   11/04/21 0532 11/05/21 0621 11/07/21 0439  Weight: 61.9 kg 59 kg 61.1 kg    Examination:  General: Looks fairly comfortable at rest.  Mildly anxious. Cardiovascular: S1-S2 normal.  Regular rhythm. Respiratory: Bilateral clear.  No added sounds. Gastrointestinal: Soft.  Mild tenderness along the ports.  No rigidity or guarding.  Bowel sound present. Ext: No swelling or edema. Neuro: Intact. Musculoskeletal: No deformities.      Data Reviewed: I have personally reviewed following labs and imaging studies  CBC: Recent Labs  Lab 10/31/21 2115 11/01/21 0302 11/04/21 0101 11/06/21 0446 11/07/21 0255  WBC 6.6 6.0 6.1 10.2 9.7  NEUTROABS 3.7 3.0 3.6  --  8.7*  HGB 11.4* 10.6* 11.7* 11.2* 10.6*  HCT 34.4* 32.2* 35.0* 34.4* 31.9*  MCV 90.8 90.2 89.3 89.8 90.1  PLT 200 167 171 203 703   Basic Metabolic Panel: Recent Labs  Lab 11/01/21 0302 11/02/21 0238 11/03/21 0350 11/04/21 0101 11/05/21 0442 11/06/21 0446 11/07/21 0255  NA 143   < > 141 142 140 134* 138  K 3.3*   < >  3.5 3.3* 3.3* 4.4 4.2  CL 106   < > 108 108 107 103 110  CO2 29   < > '24 25 22 '$ 18* 21*  GLUCOSE 126*   < > 88 100* 93 165* 176*  BUN 50*   < > 29* 31* 21 39* 38*  CREATININE 2.74*   < > 1.60* 1.71* 1.33* 1.87* 1.56*  CALCIUM 11.2*   < > 9.4 9.9 9.6 9.3 9.1  MG 1.8  --   --   --   --   --   --   PHOS 4.1  --   --   --   --   --   --    < > = values in this interval not displayed.   GFR: Estimated Creatinine Clearance: 19.5 mL/min (A) (by  C-G formula based on SCr of 1.56 mg/dL (H)). Liver Function Tests: Recent Labs  Lab 11/01/21 0302 11/03/21 0350 11/04/21 0101 11/06/21 0446 11/07/21 0255  AST '20 18 19 22 29  '$ ALT '15 13 15 18 27  '$ ALKPHOS 66 57 64 59 56  BILITOT 0.7 0.2* 0.6 0.5 0.4  PROT 5.7* 5.4* 6.1* 6.1* 5.6*  ALBUMIN 3.1* 2.7* 3.1* 2.9* 2.5*   No results for input(s): "LIPASE", "AMYLASE" in the last 168 hours. No results for input(s): "AMMONIA" in the last 168 hours. Coagulation Profile: Recent Labs  Lab 11/04/21 0101  INR 1.0   Cardiac Enzymes: No results for input(s): "CKTOTAL", "CKMB", "CKMBINDEX", "TROPONINI" in the last 168 hours. BNP (last 3 results) Recent Labs    10/31/21 1017  PROBNP 145.0*   HbA1C: No results for input(s): "HGBA1C" in the last 72 hours. CBG: Recent Labs  Lab 11/06/21 1235  GLUCAP 114*   Lipid Profile: No results for input(s): "CHOL", "HDL", "LDLCALC", "TRIG", "CHOLHDL", "LDLDIRECT" in the last 72 hours. Thyroid Function Tests: No results for input(s): "TSH", "T4TOTAL", "FREET4", "T3FREE", "THYROIDAB" in the last 72 hours. Anemia Panel: No results for input(s): "VITAMINB12", "FOLATE", "FERRITIN", "TIBC", "IRON", "RETICCTPCT" in the last 72 hours. Sepsis Labs: No results for input(s): "PROCALCITON", "LATICACIDVEN" in the last 168 hours.  Recent Results (from the past 240 hour(s))  Culture, blood (Routine X 2) w Reflex to ID Panel     Status: None (Preliminary result)   Collection Time: 11/02/21  9:48 AM   Specimen: BLOOD RIGHT HAND  Result Value Ref Range Status   Specimen Description BLOOD RIGHT HAND  Final   Special Requests   Final    BOTTLES DRAWN AEROBIC AND ANAEROBIC Blood Culture adequate volume   Culture   Final    NO GROWTH 4 DAYS Performed at [facility], [address] [place], [place] [address]    Report Status PENDING  Incomplete  Culture, blood (Routine X 2) w Reflex to ID Panel     Status: None (Preliminary result)   Collection Time:  11/02/21  9:48 AM   Specimen: BLOOD LEFT HAND  Result Value Ref Range Status   Specimen Description BLOOD LEFT HAND  Final   Special Requests   Final    BOTTLES DRAWN AEROBIC AND ANAEROBIC Blood Culture adequate volume   Culture   Final    NO GROWTH 4 DAYS Performed at [facility], [place] [address], [place], [place] [address]    Report Status PENDING  Incomplete  Surgical pcr screen     Status: None   Collection Time: 11/05/21  4:57 PM   Specimen: Nasal Mucosa; Nasal Swab  Result Value Ref Range Status   MRSA, PCR NEGATIVE NEGATIVE Final   Staphylococcus aureus NEGATIVE NEGATIVE Final    Comment: (NOTE) The Xpert SA Assay (FDA approved for NASAL specimens in patients 54 years of age and older), is one component of a comprehensive surveillance program. It is not intended to diagnose infection nor to guide or monitor treatment. Performed at [facility], [place] [address] [place], [place] [address]          Radiology Studies: ECHOCARDIOGRAM COMPLETE  Result Date: 11/06/2021    ECHOCARDIOGRAM REPORT   Patient Name:   Leah [name] Date of Exam: 11/06/2021 Medical Rec #:  [id number]         Height:       59.0 in Accession #:    [id number]        Weight:       130.1 lb Date of Birth:  30-Jun-1929        BSA:          1.536 m Patient Age:    55 years          BP:           137/73 mmHg Patient Gender: F                 HR:           76 bpm. Exam Location:  Inpatient Procedure: 2D Echo, 3D Echo, Cardiac Doppler and Color Doppler Indications:    ; Z01.818 Encounter for other preprocedural examination  History:        Patient has prior history of Echocardiogram examinations, most                 recent 11/23/2014. Abnormal ECG, Arrythmias:LBBB and AV block;                 Signs/Symptoms:Edema and Murmur.  Sonographer:    Roseanna Rainbow RDCS Referring Phys: [id number] [place]  1. Left ventricular ejection fraction, by estimation, is 50 to 55%. Left ventricular ejection  fraction by 3D volume is 54 %. The left ventricle has low normal function. The left ventricle has no regional wall motion abnormalities. There is severe hypertrophy of the basal septal segment. The rest of the LV segments demonstrate mild concentric left ventricular hypertrophy. Left ventricular diastolic parameters are consistent with Grade I diastolic dysfunction (impaired relaxation). There is septal-lateral dyssynchrony due to LBBB.  2. Right ventricular systolic function is normal. The right ventricular size is normal. Tricuspid regurgitation signal is inadequate for assessing PA pressure.  3. Left atrial size was mildly dilated.  4. The mitral valve is grossly normal. Trivial mitral valve regurgitation. No evidence of mitral stenosis. Moderate mitral annular calcification.  5. The aortic valve is tricuspid. There is mild calcification of the aortic valve. There is mild thickening of the aortic valve. Aortic valve regurgitation is not visualized. Aortic valve sclerosis/calcification is present, without any evidence of aortic stenosis.  6. The inferior vena cava is dilated in size with >50% respiratory variability, suggesting right atrial pressure of 8 mmHg. Comparison(s): Compared to prior TTE report in 2016, the LVEF is slightly lower at 54% (previously 55-60%). Otherwise, there is no significant change. FINDINGS  Left Ventricle: Left ventricular ejection fraction, by estimation, is 50 to 55%. Left ventricular ejection fraction by 3D volume is 54 %. The left ventricle has low normal function. The left ventricle has no regional wall motion abnormalities. The left ventricular internal cavity  size was normal in size. There is severe hypertrophy of the basal septal segment. The rest of the LV segments demonstrate mild concentric left ventricular hypertrophy. Abnormal (paradoxical) septal motion, consistent with left  bundle branch block. Left ventricular diastolic parameters are consistent with Grade I diastolic  dysfunction (impaired relaxation). Right Ventricle: The right ventricular size is normal. No increase in right ventricular wall thickness. Right ventricular systolic function is normal. Tricuspid regurgitation signal is inadequate for assessing PA pressure. Left Atrium: Left atrial size was mildly dilated. Right Atrium: Right atrial size was normal in size. Pericardium: There is no evidence of pericardial effusion. Mitral Valve: The mitral valve is grossly normal. There is mild thickening of the mitral valve leaflet(s). There is mild calcification of the mitral valve leaflet(s). Moderate mitral annular calcification. Trivial mitral valve regurgitation. No evidence of mitral valve stenosis. MV peak gradient, 10.1 mmHg. The mean mitral valve gradient is 3.0 mmHg. Tricuspid Valve: The tricuspid valve is normal in structure. Tricuspid valve regurgitation is trivial. Aortic Valve: The aortic valve is tricuspid. There is mild calcification of the aortic valve. There is mild thickening of the aortic valve. Aortic valve regurgitation is not visualized. Aortic valve sclerosis/calcification is present, without any evidence of aortic stenosis. Pulmonic Valve: The pulmonic valve was normal in structure. Pulmonic valve regurgitation is trivial. Aorta: The aortic root and ascending aorta are structurally normal, with no evidence of dilitation. Venous: The inferior vena cava is dilated in size with greater than 50% respiratory variability, suggesting right atrial pressure of 8 mmHg. IAS/Shunts: The atrial septum is grossly normal.  LEFT VENTRICLE PLAX 2D LVIDd:         2.60 cm         Diastology LVIDs:         2.10 cm         LV e' medial:    4.57 cm/s LV PW:         1.90 cm         LV E/e' medial:  16.6 LV IVS:        1.80 cm         LV e' lateral:   7.29 cm/s LVOT diam:     2.10 cm         LV E/e' lateral: 10.4 LV SV:         83 LV SV Index:   54 LVOT Area:     3.46 cm        3D Volume EF                                LV 3D  EF:    Left                                             ventricul LV Volumes (MOD)                            ar LV vol d, MOD    68.0 ml                    ejection A2C:  fraction LV vol d, MOD    60.6 ml                    by 3D A4C:                                        volume is LV vol s, MOD    28.2 ml                    54 %. A2C: LV vol s, MOD    27.7 ml A4C:                           3D Volume EF: LV SV MOD A2C:   39.8 ml       3D EF:        54 % LV SV MOD A4C:   60.6 ml       LV EDV:       121 ml LV SV MOD BP:    35.8 ml       LV ESV:       56 ml                                LV SV:        65 ml RIGHT VENTRICLE             IVC RV S prime:     10.70 cm/s  IVC diam: 2.60 cm TAPSE (M-mode): 2.9 cm LEFT ATRIUM             Index        RIGHT ATRIUM           Index LA diam:        3.50 cm 2.28 cm/m   RA Area:     11.90 cm LA Vol (A2C):   54.8 ml 35.67 ml/m  RA Volume:   25.90 ml  16.86 ml/m LA Vol (A4C):   28.9 ml 18.81 ml/m LA Biplane Vol: 40.8 ml 26.56 ml/m  AORTIC VALVE LVOT Vmax:   132.00 cm/s LVOT Vmean:  82.300 cm/s LVOT VTI:    0.239 m  AORTA Ao Root diam: 3.30 cm Ao Asc diam:  3.10 cm MITRAL VALVE MV Area (PHT): 3.95 cm     SHUNTS MV Area VTI:   2.96 cm     Systemic VTI:  0.24 m MV Peak grad:  10.1 mmHg    Systemic Diam: 2.10 cm MV Mean grad:  3.0 mmHg MV Vmax:       1.59 m/s MV Vmean:      87.4 cm/s MV Decel Time: 192 msec MV E velocity: 76.00 cm/s MV A velocity: 123.50 cm/s MV E/A ratio:  0.62 Gwyndolyn Kaufman MD Electronically signed by Gwyndolyn Kaufman MD Signature Date/Time: 11/06/2021/11:32:18 AM    Final    DG ERCP  Result Date: 11/05/2021 CLINICAL DATA:  Choledocholithiasis EXAM: ERCP TECHNIQUE: Multiple spot images obtained with the fluoroscopic device and submitted for interpretation post-procedure. FLUOROSCOPY: Radiation Exposure Index (as provided by the fluoroscopic device): 57.76 mGy Kerma COMPARISON:  MRCP 11/01/2021 FINDINGS: A total of 8  train intraoperative saved images are submitted for review. The images demonstrate a flexible duodenal scope in the descending duodenum with wire cannulation of the common bile  duct. Cholangiogram demonstrates multiple faceted filling defects within the mid and distal common bile duct consistent with choledocholithiasis. Subsequent images confirm balloon sphincterotomy followed by balloon sweeping of the common duct. IMPRESSION: 1. Choledocholithiasis. 2. Balloon sphincterotomy and balloon sweeping of the common duct. These images were submitted for radiologic interpretation only. Please see the procedural report for the amount of contrast and the fluoroscopy time utilized. Electronically Signed   By: Jacqulynn Cadet M.D.   On: 11/05/2021 11:58        Scheduled Meds:  amLODipine  10 mg Oral Daily   aspirin EC  81 mg Oral Daily   chlorhexidine  15 mL Mouth/Throat Once   enoxaparin (LOVENOX) injection  30 mg Subcutaneous Q24H   hydrALAZINE  100 mg Oral Q8H   latanoprost  1 drop Both Eyes QHS   pantoprazole  40 mg Oral BID   timolol  1 drop Both Eyes BID   Continuous Infusions:     LOS: 7 days    Time spent: 35 minutes    Barb Merino, MD [facility] Pager [phone]

## 2021-11-07 NOTE — Plan of Care (Signed)

## 2021-11-07 NOTE — Evaluation (Signed)
Physical Therapy Evaluation Patient Details Name: Leah [name] MRN: [id number] DOB: 1930/01/21 Today's Date: 11/07/2021  History of Present Illness  Pt is an 88 y/o female admitted 6/1 secondary to abnormal labs and not feeling well. Found to have Cholelithiasis and choledocholithiasis. Pt is s/p ERCP on 6/6 and lap chole on 6/7. PMH includes HTN and CKD.  Clinical Impression  Pt admitted secondary to problem above with deficits below. Pt requiring min guard to supervision assist for mobility tasks this session. Pt reporting increased weakness, but tolerated mobility well. Anticipate pt will progress well and will be able to d/c with HHPT. Needs to practice steps prior to discharge. Will continue to follow acutely.      Recommendations for follow up therapy are one component of a multi-disciplinary discharge planning process, led by the attending physician.  Recommendations may be updated based on patient status, additional functional criteria and insurance authorization.  Follow Up Recommendations Home health PT (pending progression)    Assistance Recommended at Discharge Intermittent Supervision/Assistance  Patient can return home with the following  A little help with bathing/dressing/bathroom;Assistance with cooking/housework;Help with stairs or ramp for entrance;Assist for transportation    Equipment Recommendations None recommended by PT  Recommendations for Other Services       Functional Status Assessment Patient has had a recent decline in their functional status and demonstrates the ability to make significant improvements in function in a reasonable and predictable amount of time.     Precautions / Restrictions Precautions Precautions: Fall Restrictions Weight Bearing Restrictions: No      Mobility  Bed Mobility Overal bed mobility: Needs Assistance Bed Mobility: Supine to Sit     Supine to sit: Supervision     General bed mobility comments: supervision for  safety    Transfers Overall transfer level: Needs assistance Equipment used: Rolling walker (2 wheels) Transfers: Sit to/from Stand Sit to Stand: Min guard           General transfer comment: Min guard for safety. Increased time required    Ambulation/Gait Ambulation/Gait assistance: Min guard, Supervision Gait Distance (Feet): 120 Feet Assistive device: Rolling walker (2 wheels) Gait Pattern/deviations: Step-through pattern, Decreased stride length Gait velocity: Decreased     General Gait Details: Slow, cautious gait. Min guard to supervision for safety.  Stairs            Wheelchair Mobility    Modified Rankin (Stroke Patients Only)       Balance Overall balance assessment: Needs assistance Sitting-balance support: No upper extremity supported, Feet supported Sitting balance-Leahy Scale: Good     Standing balance support: Bilateral upper extremity supported Standing balance-Leahy Scale: Poor Standing balance comment: Reliant on BUE support                             Pertinent Vitals/Pain Pain Assessment Pain Assessment: Faces Faces Pain Scale: Hurts little more Pain Location: R abdomen Pain Descriptors / Indicators: Guarding, Grimacing Pain Intervention(s): Limited activity within patient's tolerance, Monitored during session, Repositioned    Home Living Family/patient expects to be discharged to:: Private residence Living Arrangements: Alone Available Help at Discharge: Personal care attendant (comes 1 day/week for a few hours, but can come every day if needed) Type of Home: House Home Access: Stairs to enter Entrance Stairs-Rails: Right;Left;Can reach both Entrance Stairs-Number of Steps: 6 Alternate Level Stairs-Number of Steps: 4 (to get up to bedroom) Home Layout: Multi-level Home Equipment: Conservation officer, nature (2  wheels);Cane - single point;Cane - quad;Grab bars - tub/shower      Prior Function Prior Level of Function :  Independent/Modified Independent;Driving             Mobility Comments: Uses cane for mobility ADLs Comments: Has been independent with ADLs. Caregiver sometimes drives longer distances.     Hand Dominance        Extremity/Trunk Assessment   Upper Extremity Assessment Upper Extremity Assessment: Defer to OT evaluation    Lower Extremity Assessment Lower Extremity Assessment: Generalized weakness    Cervical / Trunk Assessment Cervical / Trunk Assessment: Kyphotic  Communication   Communication: No difficulties  Cognition Arousal/Alertness: Awake/alert Behavior During Therapy: WFL for tasks assessed/performed Overall Cognitive Status: No family/caregiver present to determine baseline cognitive functioning                                          General Comments      Exercises     Assessment/Plan    PT Assessment Patient needs continued PT services  PT Problem List Decreased strength;Decreased range of motion;Decreased activity tolerance;Decreased balance;Decreased mobility;Pain       PT Treatment Interventions DME instruction;Gait training;Functional mobility training;Stair training;Therapeutic activities;Therapeutic exercise;Balance training;Patient/family education    PT Goals (Current goals can be found in the Care Plan section)  Acute Rehab PT Goals Patient Stated Goal: to get stronger and then go home PT Goal Formulation: With patient Time For Goal Achievement: 11/21/21 Potential to Achieve Goals: Good    Frequency Min 3X/week     Co-evaluation               AM-PAC PT "6 Clicks" Mobility  Outcome Measure Help needed turning from your back to your side while in a flat bed without using bedrails?: None Help needed moving from lying on your back to sitting on the side of a flat bed without using bedrails?: A Little Help needed moving to and from a bed to a chair (including a wheelchair)?: A Little Help needed standing up  from a chair using your arms (e.g., wheelchair or bedside chair)?: A Little Help needed to walk in hospital room?: A Little Help needed climbing 3-5 steps with a railing? : A Little 6 Click Score: 19    End of Session   Activity Tolerance: Patient tolerated treatment well Patient left: in chair;with call bell/phone within reach;with chair alarm set;with nursing/sitter in room;Other (comment) (with OT present) Nurse Communication: Mobility status PT Visit Diagnosis: Unsteadiness on feet (R26.81);Muscle weakness (generalized) (M62.81)    Time: 8299-3716 PT Time Calculation (min) (ACUTE ONLY): 34 min   Charges:   PT Evaluation $PT Eval Moderate Complexity: 1 Mod PT Treatments $Gait Training: 8-22 mins        Lou Miner, DPT  Acute Rehabilitation Services  Office: [phone]   Rudean Hitt 11/07/2021, 12:04 PM

## 2021-11-07 NOTE — Progress Notes (Signed)
Mobility Specialist Criteria Algorithm Info.   11/07/21 1445  Mobility  Activity Ambulated with assistance in hallway (in chair before and after ambulation)  Range of Motion/Exercises Active;All extremities  Level of Assistance Standby assist, set-up cues, supervision of patient - no hands on  Assistive Device Front wheel walker  Distance Ambulated (ft) 500 ft  Activity Response Tolerated well   Patient received in recliner chair eager to participate in mobility. Ambulated in hallway supervision level with slow steady gait. Returned to room without complaint or incident. Was left in recliner chair with all needs met, call bell in reach.   11/07/2021 4:31 PM  Leah [name], [place], [place]  UXYBF:[phone] Office: [phone]

## 2021-11-07 NOTE — Anesthesia Postprocedure Evaluation (Signed)
Anesthesia Post Note  Patient: Leah [name]  Procedure(s) Performed: LAPAROSCOPIC CHOLECYSTECTOMY (Abdomen) Intraoperative cholangiogram with ICG     Patient location during evaluation: PACU Anesthesia Type: General Level of consciousness: awake Pain management: pain level controlled Vital Signs Assessment: post-procedure vital signs reviewed and stable Respiratory status: spontaneous breathing Cardiovascular status: stable Postop Assessment: no apparent nausea or vomiting Anesthetic complications: no   No notable events documented.  Last Vitals:  Vitals:   11/07/21 0439 11/07/21 0922  BP: 138/70 138/75  Pulse: 85 84  Resp: 18 18  Temp: 37 C 36.6 C  SpO2: 94% 95%    Last Pain:  Vitals:   11/07/21 0922  TempSrc: Oral  PainSc:                  Rogina Schiano

## 2021-11-07 NOTE — Plan of Care (Signed)

## 2021-11-07 NOTE — Progress Notes (Signed)
1 Day Post-Op  Subjective: No complaints today. No pain unless she presses on her abdomen.  Eating clear liquids with no nausea  ROS: See above, otherwise other systems negative  Objective: Vital signs in last 24 hours: Temp:  [97.4 F (36.3 C)-98.8 F (37.1 C)] 97.8 F (36.6 C) (06/08 0922) Pulse Rate:  [73-96] 84 (06/08 0922) Resp:  [16-22] 18 (06/08 0922) BP: (138-160)/(62-88) 138/75 (06/08 0922) SpO2:  [93 %-98 %] 95 % (06/08 0922) Weight:  [61.1 kg] 61.1 kg (06/08 0439) Last BM Date : 11/04/21  Intake/Output from previous day: 06/07 0701 - 06/08 0700 In: 1051.6 [P.O.:440; I.V.:611.6] Out: 2110 [Urine:2100; Blood:10] Intake/Output this shift: No intake/output data recorded.  PE: Abd: soft, appropriately tender, incisions c/d/i, ND  Lab Results:  Recent Labs    11/06/21 0446 11/07/21 0255  WBC 10.2 9.7  HGB 11.2* 10.6*  HCT 34.4* 31.9*  PLT 203 200   BMET Recent Labs    11/06/21 0446 11/07/21 0255  NA 134* 138  K 4.4 4.2  CL 103 110  CO2 18* 21*  GLUCOSE 165* 176*  BUN 39* 38*  CREATININE 1.87* 1.56*  CALCIUM 9.3 9.1   PT/INR No results for input(s): "LABPROT", "INR" in the last 72 hours.  CMP     Component Value Date/Time   NA 138 11/07/2021 0255   K 4.2 11/07/2021 0255   CL 110 11/07/2021 0255   CO2 21 (L) 11/07/2021 0255   GLUCOSE 176 (H) 11/07/2021 0255   BUN 38 (H) 11/07/2021 0255   CREATININE 1.56 (H) 11/07/2021 0255   CALCIUM 9.1 11/07/2021 0255   PROT 5.6 (L) 11/07/2021 0255   ALBUMIN 2.5 (L) 11/07/2021 0255   AST 29 11/07/2021 0255   ALT 27 11/07/2021 0255   ALKPHOS 56 11/07/2021 0255   BILITOT 0.4 11/07/2021 0255   GFRNONAA 32 (L) 11/07/2021 0255   GFRAA 71 (L) 10/16/2011 0416   Lipase  No results found for: "LIPASE"     Studies/Results: ECHOCARDIOGRAM COMPLETE  Result Date: 11/06/2021    ECHOCARDIOGRAM REPORT   Patient Name:   BREZLYN MANRIQUE Date of Exam: 11/06/2021 Medical Rec #:  [id number]         Height:        59.0 in Accession #:    [id number]        Weight:       130.1 lb Date of Birth:  1930/02/13        BSA:          1.536 m Patient Age:    88 years          BP:           137/73 mmHg Patient Gender: F                 HR:           76 bpm. Exam Location:  Inpatient Procedure: 2D Echo, 3D Echo, Cardiac Doppler and Color Doppler Indications:    ; Z01.818 Encounter for other preprocedural examination  History:        Patient has prior history of Echocardiogram examinations, most                 recent 11/23/2014. Abnormal ECG, Arrythmias:LBBB and AV block;                 Signs/Symptoms:Edema and Murmur.  Sonographer:    Roseanna Rainbow RDCS Referring Phys: [id number] Dailey  1.  Left ventricular ejection fraction, by estimation, is 50 to 55%. Left ventricular ejection fraction by 3D volume is 54 %. The left ventricle has low normal function. The left ventricle has no regional wall motion abnormalities. There is severe hypertrophy of the basal septal segment. The rest of the LV segments demonstrate mild concentric left ventricular hypertrophy. Left ventricular diastolic parameters are consistent with Grade I diastolic dysfunction (impaired relaxation). There is septal-lateral dyssynchrony due to LBBB.  2. Right ventricular systolic function is normal. The right ventricular size is normal. Tricuspid regurgitation signal is inadequate for assessing PA pressure.  3. Left atrial size was mildly dilated.  4. The mitral valve is grossly normal. Trivial mitral valve regurgitation. No evidence of mitral stenosis. Moderate mitral annular calcification.  5. The aortic valve is tricuspid. There is mild calcification of the aortic valve. There is mild thickening of the aortic valve. Aortic valve regurgitation is not visualized. Aortic valve sclerosis/calcification is present, without any evidence of aortic stenosis.  6. The inferior vena cava is dilated in size with >50% respiratory variability, suggesting right atrial  pressure of 8 mmHg. Comparison(s): Compared to prior TTE report in 2016, the LVEF is slightly lower at 54% (previously 55-60%). Otherwise, there is no significant change. FINDINGS  Left Ventricle: Left ventricular ejection fraction, by estimation, is 50 to 55%. Left ventricular ejection fraction by 3D volume is 54 %. The left ventricle has low normal function. The left ventricle has no regional wall motion abnormalities. The left ventricular internal cavity size was normal in size. There is severe hypertrophy of the basal septal segment. The rest of the LV segments demonstrate mild concentric left ventricular hypertrophy. Abnormal (paradoxical) septal motion, consistent with left  bundle branch block. Left ventricular diastolic parameters are consistent with Grade I diastolic dysfunction (impaired relaxation). Right Ventricle: The right ventricular size is normal. No increase in right ventricular wall thickness. Right ventricular systolic function is normal. Tricuspid regurgitation signal is inadequate for assessing PA pressure. Left Atrium: Left atrial size was mildly dilated. Right Atrium: Right atrial size was normal in size. Pericardium: There is no evidence of pericardial effusion. Mitral Valve: The mitral valve is grossly normal. There is mild thickening of the mitral valve leaflet(s). There is mild calcification of the mitral valve leaflet(s). Moderate mitral annular calcification. Trivial mitral valve regurgitation. No evidence of mitral valve stenosis. MV peak gradient, 10.1 mmHg. The mean mitral valve gradient is 3.0 mmHg. Tricuspid Valve: The tricuspid valve is normal in structure. Tricuspid valve regurgitation is trivial. Aortic Valve: The aortic valve is tricuspid. There is mild calcification of the aortic valve. There is mild thickening of the aortic valve. Aortic valve regurgitation is not visualized. Aortic valve sclerosis/calcification is present, without any evidence of aortic stenosis. Pulmonic  Valve: The pulmonic valve was normal in structure. Pulmonic valve regurgitation is trivial. Aorta: The aortic root and ascending aorta are structurally normal, with no evidence of dilitation. Venous: The inferior vena cava is dilated in size with greater than 50% respiratory variability, suggesting right atrial pressure of 8 mmHg. IAS/Shunts: The atrial septum is grossly normal.  LEFT VENTRICLE PLAX 2D LVIDd:         2.60 cm         Diastology LVIDs:         2.10 cm         LV e' medial:    4.57 cm/s LV PW:         1.90 cm  LV E/e' medial:  16.6 LV IVS:        1.80 cm         LV e' lateral:   7.29 cm/s LVOT diam:     2.10 cm         LV E/e' lateral: 10.4 LV SV:         83 LV SV Index:   54 LVOT Area:     3.46 cm        3D Volume EF                                LV 3D EF:    Left                                             ventricul LV Volumes (MOD)                            ar LV vol d, MOD    68.0 ml                    ejection A2C:                                        fraction LV vol d, MOD    60.6 ml                    by 3D A4C:                                        volume is LV vol s, MOD    28.2 ml                    54 %. A2C: LV vol s, MOD    27.7 ml A4C:                           3D Volume EF: LV SV MOD A2C:   39.8 ml       3D EF:        54 % LV SV MOD A4C:   60.6 ml       LV EDV:       121 ml LV SV MOD BP:    35.8 ml       LV ESV:       56 ml                                LV SV:        65 ml RIGHT VENTRICLE             IVC RV S prime:     10.70 cm/s  IVC diam: 2.60 cm TAPSE (M-mode): 2.9 cm LEFT ATRIUM             Index        RIGHT ATRIUM           Index LA diam:  3.50 cm 2.28 cm/m   RA Area:     11.90 cm LA Vol (A2C):   54.8 ml 35.67 ml/m  RA Volume:   25.90 ml  16.86 ml/m LA Vol (A4C):   28.9 ml 18.81 ml/m LA Biplane Vol: 40.8 ml 26.56 ml/m  AORTIC VALVE LVOT Vmax:   132.00 cm/s LVOT Vmean:  82.300 cm/s LVOT VTI:    0.239 m  AORTA Ao Root diam: 3.30 cm Ao Asc diam:  3.10 cm  MITRAL VALVE MV Area (PHT): 3.95 cm     SHUNTS MV Area VTI:   2.96 cm     Systemic VTI:  0.24 m MV Peak grad:  10.1 mmHg    Systemic Diam: 2.10 cm MV Mean grad:  3.0 mmHg MV Vmax:       1.59 m/s MV Vmean:      87.4 cm/s MV Decel Time: 192 msec MV E velocity: 76.00 cm/s MV A velocity: 123.50 cm/s MV E/A ratio:  0.62 Gwyndolyn Kaufman MD Electronically signed by Gwyndolyn Kaufman MD Signature Date/Time: 11/06/2021/11:32:18 AM    Final    DG ERCP  Result Date: 11/05/2021 CLINICAL DATA:  Choledocholithiasis EXAM: ERCP TECHNIQUE: Multiple spot images obtained with the fluoroscopic device and submitted for interpretation post-procedure. FLUOROSCOPY: Radiation Exposure Index (as provided by the fluoroscopic device): 57.76 mGy Kerma COMPARISON:  MRCP 11/01/2021 FINDINGS: A total of 8 train intraoperative saved images are submitted for review. The images demonstrate a flexible duodenal scope in the descending duodenum with wire cannulation of the common bile duct. Cholangiogram demonstrates multiple faceted filling defects within the mid and distal common bile duct consistent with choledocholithiasis. Subsequent images confirm balloon sphincterotomy followed by balloon sweeping of the common duct. IMPRESSION: 1. Choledocholithiasis. 2. Balloon sphincterotomy and balloon sweeping of the common duct. These images were submitted for radiologic interpretation only. Please see the procedural report for the amount of contrast and the fluoroscopy time utilized. Electronically Signed   By: Jacqulynn Cadet M.D.   On: 11/05/2021 11:58    Anti-infectives: Anti-infectives (From admission, onward)    Start     Dose/Rate Route Frequency Ordered Stop   11/06/21 1300  ampicillin-sulbactam (UNASYN) 1.5 g in sodium chloride 0.9 % 100 mL IVPB        1.5 g 200 mL/hr over 30 Minutes Intravenous  Once 11/06/21 1252 11/06/21 1401   11/02/21 0900  cefTRIAXone (ROCEPHIN) 2 g in sodium chloride 0.9 % 100 mL IVPB  Status:  Discontinued         2 g 200 mL/hr over 30 Minutes Intravenous Every 24 hours 11/02/21 0814 11/02/21 1409   11/02/21 0830  metroNIDAZOLE (FLAGYL) IVPB 500 mg  Status:  Discontinued        500 mg 100 mL/hr over 60 Minutes Intravenous Every 12 hours 11/02/21 0814 11/02/21 1409        Assessment/Plan POD 1, s/p lap chole for chronic cholecystitis, Dr. Dema Severin 6/7 -ERCP on 6/6 for choledocholithiasis -doing well surgically -regular diet -stable for Dc home when medically stable. Likely needs PT/Ot prior to DC since she lives along. -follow up made for her in our office.  FEN - regular/IVFs VTE - Lovenox ID - none currently  HLD GERD    LOS: 7 days    Henreitta Cea , [facility] 11/07/2021, 10:02 AM Please see Amion for pager number during day hours 7:00am-4:30pm or 7:00am -11:30am on weekends

## 2021-11-08 DIAGNOSIS — K8050 Calculus of bile duct without cholangitis or cholecystitis without obstruction: Secondary | ICD-10-CM | POA: Diagnosis not present

## 2021-11-08 DIAGNOSIS — N179 Acute kidney failure, unspecified: Secondary | ICD-10-CM | POA: Diagnosis not present

## 2021-11-08 MED ORDER — AMLODIPINE BESYLATE 10 MG PO TABS: 10.0000 mg | ORAL_TABLET | Freq: Every day | ORAL | 0 refills | Status: DC

## 2021-11-08 NOTE — Discharge Summary (Signed)
Physician Discharge Summary  TAMEA BAI BSW:[id number] DOB: 09-18-1929 DOA: 10/31/2021  PCP: Hoyt Koch, MD  Admit date: 10/31/2021 Discharge date: 11/08/2021  Admitted From: Home Disposition: Home with home health  Recommendations for Outpatient Follow-up:  Follow up with PCP in 1-2 weeks Please obtain BMP/CBC in one week   Home Health: PT/OT Equipment/Devices: None  Discharge Condition: Stable CODE STATUS: Full code Diet recommendation: Low-salt diet  Discharge summary: 88 year old with history of GERD, chronic left bundle branch block presented with abnormal labs from PCP office, recently having poor appetite, heaviness of legs and weakness.  In the emergency room she was found with acute kidney injury as well as choledocholithiasis with an abnormal LFTs.  Cholelithiasis and choledocholithiasis: MRCP confirmed 14 mm stone, cholelithiasis with features of cholecystitis. ERCP 6/6, biliary sludge, biliary sphincterotomy.  LFTs improving and normalized. 6/7, lap chole.  Stabilized as per surgery.  Completed antibiotic therapy.   Acute kidney injury on CKD stage IIIa: Baseline creatinine about 1.  Presentation creatinine 2.8.  Treated with IV fluids.  Renal functions improving.  Appropriately improving.  Recheck in 1 week.   History of left bundle branch block/cardiac clearance: No anginal symptoms.  Stable.   Essential hypertension: Blood pressures improved after starting on amlodipine..  Probably has chronic essential hypertension untreated.  Will discharge on amlodipine.  Will need outpatient follow-up and ambulatory blood pressure monitoring.    Medically stabilized to discharge home today.  She will benefit with home health PT OT.   Discharge Diagnoses:  Active Problems:   AKI (acute kidney injury) ([place])   Choledocholithiasis   Preoperative cardiovascular examination   Chronic diastolic heart failure (HCC)   Bundle branch block    Discharge  Instructions  Discharge Instructions     Diet - low sodium heart healthy   Complete by: As directed    Increase activity slowly   Complete by: As directed    No wound care   Complete by: As directed       Allergies as of 11/08/2021       Reactions   Sulfa Antibiotics Rash   Sulfasalazine Hives, Rash   Nabumetone Other (See Comments)   Feel weird   Naproxen Sodium Hives   Tramadol Hcl Nausea And Vomiting        Medication List     TAKE these medications    acetaminophen 650 MG CR tablet Commonly known as: TYLENOL Take 1,300 mg by mouth every 8 (eight) hours as needed for pain.   amLODipine 10 MG tablet Commonly known as: NORVASC Take 1 tablet (10 mg total) by mouth daily. Start taking on: November 09, 2021   aspirin EC 81 MG tablet Take 81 mg by mouth daily. Swallow whole.   CALCIUM-MAGNESIUM-ZINC PO Take 3 tablets by mouth daily.   denosumab 60 MG/ML Soln injection Commonly known as: PROLIA Inject 60 mg into the skin every 6 (six) months. Administer in upper arm, thigh, or abdomen   Dialyvite Vitamin D 5000 125 MCG (5000 UT) capsule Generic drug: Cholecalciferol Take 5,000 Units by mouth daily.   diclofenac 75 MG EC tablet Commonly known as: VOLTAREN TAKE ONE TABLET BY MOUTH TWICE DAILY   latanoprost 0.005 % ophthalmic solution Commonly known as: XALATAN Place 1 drop into both eyes at bedtime.   mirabegron ER 50 MG Tb24 tablet Commonly known as: Myrbetriq Take 1 tablet (50 mg total) by mouth daily.   pantoprazole 40 MG tablet Commonly known as: PROTONIX Take 1 tablet (40  mg total) by mouth 2 (two) times daily.   PREVAGEN PO Take 20 mg by mouth daily.   timolol 0.5 % ophthalmic solution Commonly known as: TIMOPTIC Place 1 drop into both eyes 2 (two) times daily.   vitamin B-12 1000 MCG tablet Commonly known as: CYANOCOBALAMIN Take 1,000 mcg by mouth daily.   vitamin C 1000 MG tablet Take 1,000 mg by mouth daily.   vitamin E 180 MG (400  UNITS) capsule Take 400 Units by mouth daily.        Follow-up Information     [facility] Follow up on 11/28/2021.   Specialty: General Surgery Why: 6/29 at 2. Please bring a copy of your photo ID and insurance card. Please arrive 30 minutes prior to your appointment for paperwork. Contact information: [place] [address] [phone]         [facility] Follow up.   Specialty: Home Health Services Why: Someone will call you to schedule first home visit. Contact information: [address] [place] [address] [phone]                Allergies  Allergen Reactions   Sulfa Antibiotics Rash   Sulfasalazine Hives and Rash   Nabumetone Other (See Comments)    Feel weird     Naproxen Sodium Hives   Tramadol Hcl Nausea And Vomiting    Consultations: General surgery Cardiology   Procedures/Studies: ECHOCARDIOGRAM COMPLETE  Result Date: 11/06/2021    ECHOCARDIOGRAM REPORT   Patient Name:   JISSEL SLAVENS Date of Exam: 11/06/2021 Medical Rec #:  [id number]         Height:       59.0 in Accession #:    [id number]        Weight:       130.1 lb Date of Birth:  05-May-1930        BSA:          1.536 m Patient Age:    88 years          BP:           137/73 mmHg Patient Gender: F                 HR:           76 bpm. Exam Location:  Inpatient Procedure: 2D Echo, 3D Echo, Cardiac Doppler and Color Doppler Indications:    ; Z01.818 Encounter for other preprocedural examination  History:        Patient has prior history of Echocardiogram examinations, most                 recent 11/23/2014. Abnormal ECG, Arrythmias:LBBB and AV block;                 Signs/Symptoms:Edema and Murmur.  Sonographer:    Roseanna Rainbow RDCS Referring Phys: [id number] [place]  1. Left ventricular ejection fraction, by estimation, is 50 to 55%. Left ventricular ejection fraction by 3D volume is 54 %. The left ventricle has  low normal function. The left ventricle has no regional wall motion abnormalities. There is severe hypertrophy of the basal septal segment. The rest of the LV segments demonstrate mild concentric left ventricular hypertrophy. Left ventricular diastolic parameters are consistent with Grade I diastolic dysfunction (impaired relaxation). There is septal-lateral dyssynchrony due to LBBB.  2. Right ventricular systolic function is normal. The right ventricular size is normal.  Tricuspid regurgitation signal is inadequate for assessing PA pressure.  3. Left atrial size was mildly dilated.  4. The mitral valve is grossly normal. Trivial mitral valve regurgitation. No evidence of mitral stenosis. Moderate mitral annular calcification.  5. The aortic valve is tricuspid. There is mild calcification of the aortic valve. There is mild thickening of the aortic valve. Aortic valve regurgitation is not visualized. Aortic valve sclerosis/calcification is present, without any evidence of aortic stenosis.  6. The inferior vena cava is dilated in size with >50% respiratory variability, suggesting right atrial pressure of 8 mmHg. Comparison(s): Compared to prior TTE report in 2016, the LVEF is slightly lower at 54% (previously 55-60%). Otherwise, there is no significant change. FINDINGS  Left Ventricle: Left ventricular ejection fraction, by estimation, is 50 to 55%. Left ventricular ejection fraction by 3D volume is 54 %. The left ventricle has low normal function. The left ventricle has no regional wall motion abnormalities. The left ventricular internal cavity size was normal in size. There is severe hypertrophy of the basal septal segment. The rest of the LV segments demonstrate mild concentric left ventricular hypertrophy. Abnormal (paradoxical) septal motion, consistent with left  bundle branch block. Left ventricular diastolic parameters are consistent with Grade I diastolic dysfunction (impaired relaxation). Right Ventricle: The  right ventricular size is normal. No increase in right ventricular wall thickness. Right ventricular systolic function is normal. Tricuspid regurgitation signal is inadequate for assessing PA pressure. Left Atrium: Left atrial size was mildly dilated. Right Atrium: Right atrial size was normal in size. Pericardium: There is no evidence of pericardial effusion. Mitral Valve: The mitral valve is grossly normal. There is mild thickening of the mitral valve leaflet(s). There is mild calcification of the mitral valve leaflet(s). Moderate mitral annular calcification. Trivial mitral valve regurgitation. No evidence of mitral valve stenosis. MV peak gradient, 10.1 mmHg. The mean mitral valve gradient is 3.0 mmHg. Tricuspid Valve: The tricuspid valve is normal in structure. Tricuspid valve regurgitation is trivial. Aortic Valve: The aortic valve is tricuspid. There is mild calcification of the aortic valve. There is mild thickening of the aortic valve. Aortic valve regurgitation is not visualized. Aortic valve sclerosis/calcification is present, without any evidence of aortic stenosis. Pulmonic Valve: The pulmonic valve was normal in structure. Pulmonic valve regurgitation is trivial. Aorta: The aortic root and ascending aorta are structurally normal, with no evidence of dilitation. Venous: The inferior vena cava is dilated in size with greater than 50% respiratory variability, suggesting right atrial pressure of 8 mmHg. IAS/Shunts: The atrial septum is grossly normal.  LEFT VENTRICLE PLAX 2D LVIDd:         2.60 cm         Diastology LVIDs:         2.10 cm         LV e' medial:    4.57 cm/s LV PW:         1.90 cm         LV E/e' medial:  16.6 LV IVS:        1.80 cm         LV e' lateral:   7.29 cm/s LVOT diam:     2.10 cm         LV E/e' lateral: 10.4 LV SV:         83 LV SV Index:   54 LVOT Area:     3.46 cm        3D Volume EF  LV 3D EF:    Left                                              ventricul LV Volumes (MOD)                            ar LV vol d, MOD    68.0 ml                    ejection A2C:                                        fraction LV vol d, MOD    60.6 ml                    by 3D A4C:                                        volume is LV vol s, MOD    28.2 ml                    54 %. A2C: LV vol s, MOD    27.7 ml A4C:                           3D Volume EF: LV SV MOD A2C:   39.8 ml       3D EF:        54 % LV SV MOD A4C:   60.6 ml       LV EDV:       121 ml LV SV MOD BP:    35.8 ml       LV ESV:       56 ml                                LV SV:        65 ml RIGHT VENTRICLE             IVC RV S prime:     10.70 cm/s  IVC diam: 2.60 cm TAPSE (M-mode): 2.9 cm LEFT ATRIUM             Index        RIGHT ATRIUM           Index LA diam:        3.50 cm 2.28 cm/m   RA Area:     11.90 cm LA Vol (A2C):   54.8 ml 35.67 ml/m  RA Volume:   25.90 ml  16.86 ml/m LA Vol (A4C):   28.9 ml 18.81 ml/m LA Biplane Vol: 40.8 ml 26.56 ml/m  AORTIC VALVE LVOT Vmax:   132.00 cm/s LVOT Vmean:  82.300 cm/s LVOT VTI:    0.239 m  AORTA Ao Root diam: 3.30 cm Ao Asc diam:  3.10 cm MITRAL VALVE MV Area (PHT): 3.95 cm     SHUNTS MV Area VTI:   2.96 cm     Systemic VTI:  0.24 m  MV Peak grad:  10.1 mmHg    Systemic Diam: 2.10 cm MV Mean grad:  3.0 mmHg MV Vmax:       1.59 m/s MV Vmean:      87.4 cm/s MV Decel Time: 192 msec MV E velocity: 76.00 cm/s MV A velocity: 123.50 cm/s MV E/A ratio:  0.62 Gwyndolyn Kaufman MD Electronically signed by Gwyndolyn Kaufman MD Signature Date/Time: 11/06/2021/11:32:18 AM    Final    DG ERCP  Result Date: 11/05/2021 CLINICAL DATA:  Choledocholithiasis EXAM: ERCP TECHNIQUE: Multiple spot images obtained with the fluoroscopic device and submitted for interpretation post-procedure. FLUOROSCOPY: Radiation Exposure Index (as provided by the fluoroscopic device): 57.76 mGy Kerma COMPARISON:  MRCP 11/01/2021 FINDINGS: A total of 8 train intraoperative saved images are submitted for  review. The images demonstrate a flexible duodenal scope in the descending duodenum with wire cannulation of the common bile duct. Cholangiogram demonstrates multiple faceted filling defects within the mid and distal common bile duct consistent with choledocholithiasis. Subsequent images confirm balloon sphincterotomy followed by balloon sweeping of the common duct. IMPRESSION: 1. Choledocholithiasis. 2. Balloon sphincterotomy and balloon sweeping of the common duct. These images were submitted for radiologic interpretation only. Please see the procedural report for the amount of contrast and the fluoroscopy time utilized. Electronically Signed   By: Jacqulynn Cadet M.D.   On: 11/05/2021 11:58   MR ABDOMEN MRCP W WO CONTAST  Result Date: 11/01/2021 CLINICAL DATA:  Choledocholithiasis. EXAM: MRI ABDOMEN WITHOUT AND WITH CONTRAST (INCLUDING MRCP) TECHNIQUE: Multiplanar multisequence MR imaging of the abdomen was performed both before and after the administration of intravenous contrast. Heavily T2-weighted images of the biliary and pancreatic ducts were obtained, and three-dimensional MRCP images were rendered by post processing. CONTRAST:  1m GADAVIST GADOBUTROL 1 MMOL/ML IV SOLN COMPARISON:  CT October 31, 2021. FINDINGS: Lower chest: No acute abnormality. Hepatobiliary: No significant hepatic steatosis. No suspicious hepatic lesion. Cholelithiasis in a distended gallbladder with gallbladder wall thickening. Choledocholithiasis measure up to 14 mm. Dilation of the extrahepatic and central intrahepatic biliary tree with the common bile duct measuring 13 mm in diameter. Subtle peribiliary enhancement. Pancreas: Intrinsic T1 signal of the pancreatic parenchyma is within normal limits. No pancreatic ductal dilation. Homogeneous postcontrast enhancement of the pancreatic parenchyma. No cystic or solid hyperenhancing pancreatic lesion identified. Spleen:  Within normal limits in size and appearance. Adrenals/Urinary  Tract: Bilateral adrenal glands appear normal. No hydronephrosis. No solid enhancing renal mass. Stomach/Bowel: Colonic diverticulosis without findings of acute diverticulitis. No evidence of bowel obstruction. Vascular/Lymphatic: Normal caliber abdominal aorta. The portal, splenic and superior mesenteric veins are patent. No pathologically enlarged abdominal lymph nodes. Other:  No significant abdominal free fluid. Musculoskeletal: S shaped curvature of the thoracolumbar spine with associated degenerative change. No suspicious osseous lesion identified. IMPRESSION: 1. Choledocholithiasis measuring up to 14 mm trauma with dilation of the extra and central intrahepatic biliary tree as well as subtle peribiliary enhancement, suggest clinical correlation for signs/symptoms of ascending cholangitis. 2. Cholelithiasis in a distended gallbladder with gallbladder wall thickening possibly reflecting acute cholecystitis. Electronically Signed   By: JDahlia BailiffM.D.   On: 11/01/2021 11:48   MR 3D Recon At Scanner  Result Date: 11/01/2021 CLINICAL DATA:  Choledocholithiasis. EXAM: MRI ABDOMEN WITHOUT AND WITH CONTRAST (INCLUDING MRCP) TECHNIQUE: Multiplanar multisequence MR imaging of the abdomen was performed both before and after the administration of intravenous contrast. Heavily T2-weighted images of the biliary and pancreatic ducts were obtained, and three-dimensional MRCP images were rendered by post  processing. CONTRAST:  65m GADAVIST GADOBUTROL 1 MMOL/ML IV SOLN COMPARISON:  CT October 31, 2021. FINDINGS: Lower chest: No acute abnormality. Hepatobiliary: No significant hepatic steatosis. No suspicious hepatic lesion. Cholelithiasis in a distended gallbladder with gallbladder wall thickening. Choledocholithiasis measure up to 14 mm. Dilation of the extrahepatic and central intrahepatic biliary tree with the common bile duct measuring 13 mm in diameter. Subtle peribiliary enhancement. Pancreas: Intrinsic T1 signal of  the pancreatic parenchyma is within normal limits. No pancreatic ductal dilation. Homogeneous postcontrast enhancement of the pancreatic parenchyma. No cystic or solid hyperenhancing pancreatic lesion identified. Spleen:  Within normal limits in size and appearance. Adrenals/Urinary Tract: Bilateral adrenal glands appear normal. No hydronephrosis. No solid enhancing renal mass. Stomach/Bowel: Colonic diverticulosis without findings of acute diverticulitis. No evidence of bowel obstruction. Vascular/Lymphatic: Normal caliber abdominal aorta. The portal, splenic and superior mesenteric veins are patent. No pathologically enlarged abdominal lymph nodes. Other:  No significant abdominal free fluid. Musculoskeletal: S shaped curvature of the thoracolumbar spine with associated degenerative change. No suspicious osseous lesion identified. IMPRESSION: 1. Choledocholithiasis measuring up to 14 mm trauma with dilation of the extra and central intrahepatic biliary tree as well as subtle peribiliary enhancement, suggest clinical correlation for signs/symptoms of ascending cholangitis. 2. Cholelithiasis in a distended gallbladder with gallbladder wall thickening possibly reflecting acute cholecystitis. Electronically Signed   By: JDahlia BailiffM.D.   On: 11/01/2021 11:48   CT Renal Stone Study  Result Date: 10/31/2021 CLINICAL DATA:  Acute kidney injury. EXAM: CT ABDOMEN AND PELVIS WITHOUT CONTRAST TECHNIQUE: Multidetector CT imaging of the abdomen and pelvis was performed following the standard protocol without IV contrast. RADIATION DOSE REDUCTION: This exam was performed according to the departmental dose-optimization program which includes automated exposure control, adjustment of the mA and/or kV according to patient size and/or use of iterative reconstruction technique. COMPARISON:  None Available. FINDINGS: Lower chest: No acute abnormality. Hepatobiliary: No focal liver abnormality is seen. The gallbladder is  moderately distended without evidence of gallbladder wall thickening or pericholecystic inflammation. Multiple ill-defined, approximately 7 mm hyperdense foci are seen within a dilated common bile duct (axial CT images 25, 28 and 31, CT series 3). Pancreas: Unremarkable. No pancreatic ductal dilatation or surrounding inflammatory changes. Spleen: Normal in size without focal abnormality. Adrenals/Urinary Tract: Adrenal glands are unremarkable. Kidneys are normal, without renal calculi, focal lesion, or hydronephrosis. Bladder is unremarkable. Stomach/Bowel: Stomach is within normal limits. Appendix appears normal. No evidence of bowel wall thickening, distention, or inflammatory changes. Noninflamed diverticula are seen throughout the large bowel. Vascular/Lymphatic: Aortic atherosclerosis. No enlarged abdominal or pelvic lymph nodes. Reproductive: Uterus and bilateral adnexa are limited in evaluation secondary to overlying streak artifact. Other: No abdominal wall hernia or abnormality. No abdominopelvic ascites. Musculoskeletal: Bilateral total hip replacements are seen with associated streak artifact and subsequently limited evaluation of the adjacent osseous and soft tissue structures. Chronic fracture deformities are seen involving the bilateral superior and inferior pubic rami. Prior vertebroplasty is seen at the level of S1. Multilevel degenerative changes are noted throughout the lumbar spine. IMPRESSION: 1. Findings consistent with choledocholithiasis. Further evaluation with MRCP is recommended. 2. Colonic diverticulosis. 3. Bilateral total hip replacements with associated streak artifact and subsequently limited evaluation of the adjacent osseous and soft tissue structures. 4. Chronic fracture deformities involving the bilateral superior and inferior pubic rami. 5. Aortic atherosclerosis. Aortic Atherosclerosis (ICD10-I70.0). Electronically Signed   By: TVirgina NorfolkM.D.   On: 10/31/2021 22:43    (Echo, Carotid, EGD, Colonoscopy, ERCP)  Subjective: Patient seen and examined.  No overnight events.  Denies any nausea vomiting or abdominal pain.  Tolerating diet.  Normal bowel movements.  Able to walk around in the hallway with walker.  Feels comfortable going home.   Discharge Exam: Vitals:   11/08/21 0521 11/08/21 0835  BP: (!) 144/61 (!) 153/68  Pulse: 76 81  Resp: 17 19  Temp: 98.1 F (36.7 C) 97.9 F (36.6 C)  SpO2: 97% 93%   Vitals:   11/07/21 2047 11/08/21 0500 11/08/21 0521 11/08/21 0835  BP: (!) 150/78  (!) 144/61 (!) 153/68  Pulse: 86  76 81  Resp: '18  17 19  '$ Temp: 98.2 F (36.8 C)  98.1 F (36.7 C) 97.9 F (36.6 C)  TempSrc:      SpO2: 92%  97% 93%  Weight:  62.2 kg    Height:        General: Pt is alert, awake, not in acute distress Walking in the hallway. Cardiovascular: RRR, S1/S2 +, no rubs, no gallops Respiratory: CTA bilaterally, no wheezing, no rhonchi Abdominal: Soft, nontender., ND, bowel sounds + Extremities: no edema, no cyanosis    The results of significant diagnostics from this hospitalization (including imaging, microbiology, ancillary and laboratory) are listed below for reference.     Microbiology: Recent Results (from the past 240 hour(s))  Culture, blood (Routine X 2) w Reflex to ID Panel     Status: None   Collection Time: 11/02/21  9:48 AM   Specimen: BLOOD RIGHT HAND  Result Value Ref Range Status   Specimen Description BLOOD RIGHT HAND  Final   Special Requests   Final    BOTTLES DRAWN AEROBIC AND ANAEROBIC Blood Culture adequate volume   Culture   Final    NO GROWTH 5 DAYS Performed at [facility], [address] [place], [place] [address]    Report Status 11/07/2021 FINAL  Final  Culture, blood (Routine X 2) w Reflex to ID Panel     Status: None   Collection Time: 11/02/21  9:48 AM   Specimen: BLOOD LEFT HAND  Result Value Ref Range Status   Specimen Description BLOOD LEFT HAND  Final   Special  Requests   Final    BOTTLES DRAWN AEROBIC AND ANAEROBIC Blood Culture adequate volume   Culture   Final    NO GROWTH 5 DAYS Performed at [facility], [place] [address] [place], [place] [address]    Report Status 11/07/2021 FINAL  Final  Surgical pcr screen     Status: None   Collection Time: 11/05/21  4:57 PM   Specimen: Nasal Mucosa; Nasal Swab  Result Value Ref Range Status   MRSA, PCR NEGATIVE NEGATIVE Final   Staphylococcus aureus NEGATIVE NEGATIVE Final    Comment: (NOTE) The Xpert SA Assay (FDA approved for NASAL specimens in patients 54 years of age and older), is one component of a comprehensive surveillance program. It is not intended to diagnose infection nor to guide or monitor treatment. Performed at [facility], [place] [address] [place], [place] [address]      Labs: BNP (last 3 results) Recent Labs    10/31/21 2011 11/05/21 0442  BNP 206.4* 973.5*   Basic Metabolic Panel: Recent Labs  Lab 11/03/21 0350 11/04/21 0101 11/05/21 0442 11/06/21 0446 11/07/21 0255  NA 141 142 140 134* 138  K 3.5 3.3* 3.3* 4.4 4.2  CL 108 108 107 103 110  CO2 '24 25 22 '$ 18*  21*  GLUCOSE 88 100* 93 165* 176*  BUN 29* 31* 21 39* 38*  CREATININE 1.60* 1.71* 1.33* 1.87* 1.56*  CALCIUM 9.4 9.9 9.6 9.3 9.1   Liver Function Tests: Recent Labs  Lab 11/03/21 0350 11/04/21 0101 11/06/21 0446 11/07/21 0255  AST '18 19 22 29  '$ ALT '13 15 18 27  '$ ALKPHOS 57 64 59 56  BILITOT 0.2* 0.6 0.5 0.4  PROT 5.4* 6.1* 6.1* 5.6*  ALBUMIN 2.7* 3.1* 2.9* 2.5*   No results for input(s): "LIPASE", "AMYLASE" in the last 168 hours. No results for input(s): "AMMONIA" in the last 168 hours. CBC: Recent Labs  Lab 11/04/21 0101 11/06/21 0446 11/07/21 0255  WBC 6.1 10.2 9.7  NEUTROABS 3.6  --  8.7*  HGB 11.7* 11.2* 10.6*  HCT 35.0* 34.4* 31.9*  MCV 89.3 89.8 90.1  PLT 171 203 200   Cardiac Enzymes: No results for input(s): "CKTOTAL", "CKMB", "CKMBINDEX", "TROPONINI" in the last 168  hours. BNP: Invalid input(s): "POCBNP" CBG: Recent Labs  Lab 11/06/21 1235  GLUCAP 114*   D-Dimer No results for input(s): "DDIMER" in the last 72 hours. Hgb A1c No results for input(s): "HGBA1C" in the last 72 hours. Lipid Profile No results for input(s): "CHOL", "HDL", "LDLCALC", "TRIG", "CHOLHDL", "LDLDIRECT" in the last 72 hours. Thyroid function studies No results for input(s): "TSH", "T4TOTAL", "T3FREE", "THYROIDAB" in the last 72 hours.  Invalid input(s): "FREET3" Anemia work up No results for input(s): "VITAMINB12", "FOLATE", "FERRITIN", "TIBC", "IRON", "RETICCTPCT" in the last 72 hours. Urinalysis    Component Value Date/Time   COLORURINE YELLOW 10/31/2021 2017   APPEARANCEUR HAZY (A) 10/31/2021 2017   LABSPEC 1.016 10/31/2021 2017   PHURINE 6.0 10/31/2021 2017   GLUCOSEU NEGATIVE 10/31/2021 2017   HGBUR NEGATIVE 10/31/2021 2017   HGBUR negative 02/11/2007 0846   BILIRUBINUR NEGATIVE 10/31/2021 2017   BILIRUBINUR n 11/10/2012 1000   KETONESUR NEGATIVE 10/31/2021 2017   PROTEINUR NEGATIVE 10/31/2021 2017   UROBILINOGEN 0.2 01/26/2014 1050   NITRITE NEGATIVE 10/31/2021 2017   LEUKOCYTESUR SMALL (A) 10/31/2021 2017   Sepsis Labs Recent Labs  Lab 11/04/21 0101 11/06/21 0446 11/07/21 0255  WBC 6.1 10.2 9.7   Microbiology Recent Results (from the past 240 hour(s))  Culture, blood (Routine X 2) w Reflex to ID Panel     Status: None   Collection Time: 11/02/21  9:48 AM   Specimen: BLOOD RIGHT HAND  Result Value Ref Range Status   Specimen Description BLOOD RIGHT HAND  Final   Special Requests   Final    BOTTLES DRAWN AEROBIC AND ANAEROBIC Blood Culture adequate volume   Culture   Final    NO GROWTH 5 DAYS Performed at [facility], [place] [address], [place], [place] [address]    Report Status 11/07/2021 FINAL  Final  Culture, blood (Routine X 2) w Reflex to ID Panel     Status: None   Collection Time: 11/02/21  9:48 AM   Specimen: BLOOD LEFT HAND   Result Value Ref Range Status   Specimen Description BLOOD LEFT HAND  Final   Special Requests   Final    BOTTLES DRAWN AEROBIC AND ANAEROBIC Blood Culture adequate volume   Culture   Final    NO GROWTH 5 DAYS Performed at [facility], [place] [address], [place], [place] [address]    Report Status 11/07/2021 FINAL  Final  Surgical pcr screen     Status: None   Collection Time: 11/05/21  4:57  PM   Specimen: Nasal Mucosa; Nasal Swab  Result Value Ref Range Status   MRSA, PCR NEGATIVE NEGATIVE Final   Staphylococcus aureus NEGATIVE NEGATIVE Final    Comment: (NOTE) The Xpert SA Assay (FDA approved for NASAL specimens in patients 48 years of age and older), is one component of a comprehensive surveillance program. It is not intended to diagnose infection nor to guide or monitor treatment. Performed at [facility], [place] [address], [place], [place] [address]      Time coordinating discharge: 32 minutes  SIGNED:   Barb Merino, MD  [facility] 11/08/2021, 10:55 AM

## 2021-11-08 NOTE — Progress Notes (Signed)
DISCHARGE NOTE HOME Leah [name] to be discharged Home per MD order. Discussed prescriptions and follow up appointments with the patient. Prescriptions given to patient; medication list explained in detail. Patient verbalized understanding.  Skin clean, dry and intact without evidence of skin break down, no evidence of skin tears noted. IV catheter discontinued intact. Site without signs and symptoms of complications. Dressing and pressure applied. Pt denies pain at the site currently. No complaints noted.  Patient free of lines, drains, and wounds.   An After Visit Summary (AVS) was printed and given to the patient. Patient escorted via wheelchair, and discharged home via private auto.  Vira Agar, RN

## 2021-11-08 NOTE — Progress Notes (Signed)
Mobility Specialist Progress Note:   11/08/21 1020  Mobility  Activity Ambulated with assistance to bathroom  Level of Assistance Standby assist, set-up cues, supervision of patient - no hands on  Assistive Device Front wheel walker  Distance Ambulated (ft) 15 ft  Activity Response Tolerated well  $Mobility charge 1 Mobility   Pt ambulated back from BR. BM successful. Pt left in chair with all needs met.   Nelta Numbers Acute Rehab Secure Chat or Office Phone: [phone]

## 2021-11-08 NOTE — Progress Notes (Signed)
Physical Therapy Treatment Patient Details Name: Leah [name] MRN: [id number] DOB: 06-07-1929 Today's Date: 11/08/2021   History of Present Illness Pt is an 88 y/o female admitted 6/1 secondary to abnormal labs and not feeling well. Found to have Cholelithiasis and choledocholithiasis. Pt is s/p ERCP on 6/6 and lap chole on 6/7. PMH includes HTN and CKD.    PT Comments    Patient seen for stair training prior to discharge. Able to negotiate 5 stairs with B handrails and supervision with alternating pattern for ascent and step to pattern for descent. Patient ambulating at supervision level with RW. STM deficits noted but may be baseline. D/c plan remains appropriate.     Recommendations for follow up therapy are one component of a multi-disciplinary discharge planning process, led by the attending physician.  Recommendations may be updated based on patient status, additional functional criteria and insurance authorization.  Follow Up Recommendations  Home health PT     Assistance Recommended at Discharge Intermittent Supervision/Assistance  Patient can return home with the following A little help with bathing/dressing/bathroom;Assistance with cooking/housework;Help with stairs or ramp for entrance;Assist for transportation   Equipment Recommendations  None recommended by PT    Recommendations for Other Services       Precautions / Restrictions Precautions Precautions: Fall Restrictions Weight Bearing Restrictions: No     Mobility  Bed Mobility               General bed mobility comments: up in recliner    Transfers Overall transfer level: Needs assistance Equipment used: Rolling Flynn Lininger (2 wheels) Transfers: Sit to/from Stand Sit to Stand: Supervision                Ambulation/Gait Ambulation/Gait assistance: Supervision Gait Distance (Feet): 250 Feet Assistive device: Rolling Jeniece Hannis (2 wheels) Gait Pattern/deviations: Step-through pattern, Decreased  stride length Gait velocity: decreased     General Gait Details: supervision for safety. No overt LOB.   Stairs Stairs: Yes Stairs assistance: Supervision Stair Management: Two rails, Alternating pattern, Forwards Number of Stairs: 5 General stair comments: supervision for safety. Alternating step pattern for ascent and step to pattern for descent   Wheelchair Mobility    Modified Rankin (Stroke Patients Only)       Balance Overall balance assessment: Needs assistance Sitting-balance support: No upper extremity supported, Feet supported Sitting balance-Leahy Scale: Good     Standing balance support: Reliant on assistive device for balance Standing balance-Leahy Scale: Fair                              Cognition Arousal/Alertness: Awake/alert Behavior During Therapy: WFL for tasks assessed/performed Overall Cognitive Status: Within Functional Limits for tasks assessed                                 General Comments: STM memory deficits noted        Exercises      General Comments        Pertinent Vitals/Pain Pain Assessment Pain Assessment: No/denies pain    Home Living                          Prior Function            PT Goals (current goals can now be found in the care plan section) Acute Rehab PT Goals Patient Stated  Goal: to get stronger and then go home PT Goal Formulation: With patient Time For Goal Achievement: 11/21/21 Potential to Achieve Goals: Good Progress towards PT goals: Progressing toward goals    Frequency    Min 3X/week      PT Plan Current plan remains appropriate    Co-evaluation              AM-PAC PT "6 Clicks" Mobility   Outcome Measure  Help needed turning from your back to your side while in a flat bed without using bedrails?: None Help needed moving from lying on your back to sitting on the side of a flat bed without using bedrails?: A Little Help needed moving to  and from a bed to a chair (including a wheelchair)?: A Little Help needed standing up from a chair using your arms (e.g., wheelchair or bedside chair)?: A Little Help needed to walk in hospital room?: A Little Help needed climbing 3-5 steps with a railing? : A Little 6 Click Score: 19    End of Session   Activity Tolerance: Patient tolerated treatment well Patient left: Other (comment) (in w/c to be transported to entrance for discharge) Nurse Communication: Mobility status PT Visit Diagnosis: Unsteadiness on feet (R26.81);Muscle weakness (generalized) (M62.81)     Time: 1448-1500 PT Time Calculation (min) (ACUTE ONLY): 12 min  Charges:  $Gait Training: 8-22 mins                     Mabrey Howland A. Gilford Rile PT, DPT Acute Rehabilitation Services Office [phone]    Linna Hoff 11/08/2021, 3:06 PM

## 2021-11-08 NOTE — TOC Transition Note (Signed)
Transition of Care [facility]) - CM/SW Discharge Note   Patient Details  Name: Leah [name] MRN: [id number] Date of Birth: 1929/07/27  Transition of Care [facility]) CM/SW Contact:  Tom-Johnson, Renea Ee, RN Phone Number: 11/08/2021, 2:05 PM   Clinical Narrative:     Patient is scheduled for discharge today. Home health disciplines with [facility] and info on AVS. Friends to transport at discharge. No further TOC needs noted.    Final next level of care: [place] Barriers to Discharge: Barriers Resolved   Patient Goals and CMS Choice Patient states their goals for this hospitalization and ongoing recovery are:: To return home CMS Medicare.gov Compare Post Acute Care list provided to:: Patient Choice offered to / list presented to : Patient  Discharge Placement                Patient to be transferred to facility by: Friends      Discharge Plan and Services                DME Arranged: N/A DME Agency: NA       HH Arranged: PT, OT, Nurse's Aide [place] Agency: [place] Date [facility] Agency Contacted: 11/07/21 Time [place]: 1218 Representative spoke with at [place]: Tommi Rumps  Social Determinants of Health ([place]) Interventions     Readmission Risk Interventions     No data to display

## 2021-11-08 NOTE — Plan of Care (Signed)

## 2021-11-08 NOTE — Progress Notes (Signed)
Mobility Specialist Progress Note:   11/08/21 1000  Mobility  Activity Ambulated with assistance in hallway  Level of Assistance Standby assist, set-up cues, supervision of patient - no hands on  Assistive Device Front wheel walker  Distance Ambulated (ft) 500 ft  Activity Response Tolerated well  $Mobility charge 1 Mobility   Pt eager for mobility session this am. Required no physical assistance throughout. Pt needing to go to BR prior to returning to bed. Pt left in BR with all needs met, will f/u to return to bed.   Nelta Numbers Acute Rehab Secure Chat or Office Phone: [phone]

## 2021-11-08 NOTE — Progress Notes (Signed)
Occupational Therapy Treatment Patient Details Name: Leah [name] MRN: [id number] DOB: [birth year] Today's Date: 11/08/2021   History of present illness Pt is an 88 y/o female admitted 6/1 secondary to abnormal labs and not feeling well. Found to have Cholelithiasis and choledocholithiasis. Pt is s/p ERCP on 6/6 and lap chole on 6/7. PMH includes HTN and CKD.   OT comments  Patient preparing for discharge home this date.  Patient needing Min A for lower body ADL secondary to abdominal wound, otherwise generalized supervision.  Patient will be having friends stay with her for a few days to assist as needed.  HH has been arranged, and no further needs in the acute setting.     Recommendations for follow up therapy are one component of a multi-disciplinary discharge planning process, led by the attending physician.  Recommendations may be updated based on patient status, additional functional criteria and insurance authorization.    Follow Up Recommendations  Home health OT    Assistance Recommended at Discharge Intermittent Supervision/Assistance  Patient can return home with the following  Assist for transportation;Assistance with cooking/housework;A little help with bathing/dressing/bathroom   Equipment Recommendations  None recommended by OT    Recommendations for Other Services      Precautions / Restrictions Precautions Precautions: Fall Restrictions Weight Bearing Restrictions: No       Mobility Bed Mobility Overal bed mobility: Needs Assistance             General bed mobility comments: up in recliner    Transfers Overall transfer level: Needs assistance Equipment used: Rolling walker (2 wheels) Transfers: Sit to/from Stand Sit to Stand: Supervision                 Balance Overall balance assessment: Needs assistance Sitting-balance support: No upper extremity supported, Feet supported Sitting balance-Leahy Scale: Good     Standing balance  support: Reliant on assistive device for balance Standing balance-Leahy Scale: Fair                             ADL either performed or assessed with clinical judgement   ADL       Grooming: Wash/dry hands;Oral care;Standing;Set up           Upper Body Dressing : Supervision/safety;Sitting   Lower Body Dressing: Minimal assistance;Sit to/from stand   Toilet Transfer: Supervision/safety;Rolling walker (2 wheels);Regular Toilet                  Extremity/Trunk Assessment     Lower Extremity Assessment Lower Extremity Assessment: Defer to PT evaluation   Cervical / Trunk Assessment Cervical / Trunk Assessment: Kyphotic                      Cognition Arousal/Alertness: Awake/alert Behavior During Therapy: WFL for tasks assessed/performed Overall Cognitive Status: Within Functional Limits for tasks assessed                                                             Pertinent Vitals/ Pain       Pain Assessment Pain Assessment: No/denies pain Pain Intervention(s): Monitored during session  Frequency           Progress Toward Goals  OT Goals(current goals can now be found in the care plan section)  Progress towards OT goals: Progressing toward goals  Acute Rehab OT Goals OT Goal Formulation: With patient Time For Goal Achievement: 11/21/21 Potential to Achieve Goals: Good  Plan All goals met and education completed, patient discharged from OT services    Co-evaluation                 AM-PAC OT "6 Clicks" Daily Activity     Outcome Measure   Help from another person eating meals?: None Help from another person taking care of personal grooming?: None Help from another person toileting, which includes using toliet, bedpan, or urinal?: A Little Help from another person bathing (including washing, rinsing, drying)?: A  Little Help from another person to put on and taking off regular upper body clothing?: None Help from another person to put on and taking off regular lower body clothing?: A Little 6 Click Score: 21    End of Session Equipment Utilized During Treatment: Rolling walker (2 wheels)      Activity Tolerance Patient tolerated treatment well   Patient Left in chair;with call bell/phone within reach   Nurse Communication Mobility status        Time: 1405-1430 OT Time Calculation (min): 25 min  Charges: OT General Charges $OT Visit: 1 Visit OT Treatments $Self Care/Home Management : 8-22 mins  11/08/2021  RP, OTR/L  Acute Rehabilitation Services  Office:  [phone]   Metta Clines 11/08/2021, 2:38 PM

## 2021-11-11 ENCOUNTER — Telehealth: Payer: Self-pay

## 2021-11-11 NOTE — Telephone Encounter (Signed)
Transition Care Management Follow-up Telephone Call Date of discharge and from where: [place] 11-08-21 Dx: AKI How have you been since you were released from the hospital? Still weak but ok  Any questions or concerns? No  Items Reviewed: Did the pt receive and understand the discharge instructions provided? Yes  Medications obtained and verified? Yes  Other? no Any new allergies since your discharge? No  Dietary orders reviewed? Yes Do you have support at home? Yes   Home Care and Equipment/Supplies: Were home health services ordered? Yes PT/OT If so, what is the name of the agency? [facility]   Has the agency set up a time to come to the patient's home? no Were any new equipment or medical supplies ordered?  No What is the name of the medical supply agency? na Were you able to get the supplies/equipment? not applicable Do you have any questions related to the use of the equipment or supplies? No  Functional Questionnaire: (I = Independent and D = Dependent) ADLs: I  Bathing/Dressing- I  Meal Prep- I  Eating- I  Maintaining continence- I  Transferring/Ambulation- I- WALKER  Managing Meds- I  Follow up appointments reviewed:  PCP Hospital f/u appt confirmed? Yes  Scheduled [facility]? Yes  Scheduled to see surgeon on 11-28-21 @ 2pm. Are transportation arrangements needed? No  If their condition worsens, is the pt aware to call PCP or go to the Emergency Dept.? Yes Was the patient provided with contact information for the PCP's office or ED? Yes Was to pt encouraged to call back with questions or concerns? Yes

## 2021-11-15 ENCOUNTER — Telehealth: Payer: Self-pay | Admitting: Internal Medicine

## 2021-11-15 NOTE — Telephone Encounter (Signed)
Leah [name] at [facility] to ok verbals per provider

## 2021-11-15 NOTE — Telephone Encounter (Signed)
[place] for verbals should keep upcoming apt

## 2021-11-15 NOTE — Telephone Encounter (Signed)
Shanon Brow with [facility] calls today with requests for [facility] orders. The frequency of these being   Twice a week for four weeks Once a week for two weeks  These can be given verbally through call or secure VM at   [facility]: [phone]  Also FYI regarding PT, this referral for [facility] is due to PT's recent hospitalization.

## 2021-11-19 ENCOUNTER — Ambulatory Visit: Payer: Medicare PPO | Admitting: Internal Medicine

## 2021-11-19 VITALS — BP 136/70 | HR 76 | Temp 98.1°F | Resp 18 | Ht 59.0 in | Wt 130.2 lb

## 2021-11-19 DIAGNOSIS — M533 Sacrococcygeal disorders, not elsewhere classified: Secondary | ICD-10-CM

## 2021-11-19 DIAGNOSIS — N179 Acute kidney failure, unspecified: Secondary | ICD-10-CM | POA: Diagnosis not present

## 2021-11-19 DIAGNOSIS — I447 Left bundle-branch block, unspecified: Secondary | ICD-10-CM

## 2021-11-19 DIAGNOSIS — R5383 Other fatigue: Secondary | ICD-10-CM | POA: Diagnosis not present

## 2021-11-19 DIAGNOSIS — K8050 Calculus of bile duct without cholangitis or cholecystitis without obstruction: Secondary | ICD-10-CM

## 2021-11-19 LAB — COMPREHENSIVE METABOLIC PANEL
ALT: 21 U/L (ref 0–35)
AST: 23 U/L (ref 0–37)
Albumin: 4.1 g/dL (ref 3.5–5.2)
Alkaline Phosphatase: 77 U/L (ref 39–117)
BUN: 30 mg/dL — ABNORMAL HIGH (ref 6–23)
CO2: 29 mEq/L (ref 19–32)
Calcium: 10.1 mg/dL (ref 8.4–10.5)
Chloride: 102 mEq/L (ref 96–112)
Creatinine, Ser: 1.55 mg/dL — ABNORMAL HIGH (ref 0.40–1.20)
GFR: 29.53 mL/min — ABNORMAL LOW (ref >60.00)
Glucose, Bld: 88 mg/dL (ref 70–99)
Potassium: 4.1 mEq/L (ref 3.5–5.1)
Sodium: 139 mEq/L (ref 135–145)
Total Bilirubin: 0.6 mg/dL (ref 0.2–1.2)
Total Protein: 7.2 g/dL (ref 6.0–8.3)

## 2021-11-19 LAB — CBC
HCT: 33 % — ABNORMAL LOW (ref 36.0–46.0)
Hemoglobin: 11.2 g/dL — ABNORMAL LOW (ref 12.0–15.0)
MCHC: 34 g/dL (ref 30.0–36.0)
MCV: 88.9 fl (ref 78.0–100.0)
Platelets: 272 10*3/uL (ref 150.0–400.0)
RBC: 3.71 Mil/uL — ABNORMAL LOW (ref 3.87–5.11)
RDW: 14.1 % (ref 11.5–15.5)
WBC: 8.4 10*3/uL (ref 4.0–10.5)

## 2021-11-19 MED ORDER — MUPIROCIN 2 % EX OINT
1.0000 | TOPICAL_OINTMENT | Freq: Two times a day (BID) | CUTANEOUS | 11 refills | Status: DC
Start: 2021-11-19 — End: 2023-01-01

## 2021-11-19 NOTE — Patient Instructions (Signed)
We will check the labs today and have sent in the ointment for the wound.

## 2021-11-19 NOTE — Progress Notes (Signed)
   Subjective:   Patient ID: Leah [name], female    DOB: Sep 23, 1929, 88 y.o.   MRN: [id number]  HPI The patient is an 88 YO female coming in for hospital follow up (discharged from acute choledocholithiasis and ERCP and cholecystectomy.  Review of Systems  Objective:  Physical Exam  Vitals:   11/19/21 1320  BP: 136/70  Pulse: 76  Resp: 18  Temp: 98.1 F (36.7 C)  SpO2: 98%  Weight: 130 lb 3.2 oz (59.1 kg)  Height: '4\' 11"'$  (1.499 m)    Assessment & Plan:

## 2021-11-20 ENCOUNTER — Encounter: Payer: Self-pay | Admitting: Internal Medicine

## 2021-11-20 NOTE — Assessment & Plan Note (Signed)
She was curious about this finding in the hospital and we discussed etiology and reassurance given.

## 2021-11-20 NOTE — Assessment & Plan Note (Signed)
S/P cholecystectomy and checking CBC today. Will likely be some post-op change to Hg levels. No clinical signs of bleeding and good healing.

## 2021-11-20 NOTE — Assessment & Plan Note (Signed)
Small skin breakdown on the left buttock due to position which is intermittent. Rx bactroban ointment given sulfa allergy to use topically and counseled about offloading pressure and position change often.

## 2021-11-20 NOTE — Assessment & Plan Note (Signed)
She is still having some post operative fatigue. We discussed her 7-8 day stay in the hospital will likely take 6-8 weeks for full recovery to energy. She is also having some mood disturbance which is likely related to her recent serious illness. We discussed whether medication would be helpful but since this takes several weeks to work likely she will be better by then. If she is worsening or not improving she will contact us.

## 2021-11-20 NOTE — Assessment & Plan Note (Signed)
Thorough explanation of her AKI and state of her kidneys prior to AKI today. She is now understanding and feels more secure that her kidney function is improving. It was not back to baseline on discharge. Ordered CMP today and if improving or stable will recheck in 1-2 months and we discussed it could be 3-6 months before renal function stabilizes either at her prior baseline or at a new baseline. Current renal function is adequate and will avoid AKI in future.

## 2021-12-18 ENCOUNTER — Telehealth: Payer: Self-pay | Admitting: Internal Medicine

## 2021-12-18 NOTE — Telephone Encounter (Signed)
Pt was given an RX for amLODipine (NORVASC) 10 MG tablet 6.10.23 when she was in the hospital. She has run out of the medication and wants to know if she should have it refilled or if she is done taking it.   Please advise.   [place]: [phone]

## 2021-12-19 MED ORDER — AMLODIPINE BESYLATE 10 MG PO TABS: 10.0000 mg | ORAL_TABLET | Freq: Every day | ORAL | 3 refills | Status: DC

## 2021-12-19 NOTE — Telephone Encounter (Signed)
Have refilled should keep taking.

## 2022-01-13 ENCOUNTER — Other Ambulatory Visit: Payer: Self-pay | Admitting: Gastroenterology

## 2022-01-13 DIAGNOSIS — K259 Gastric ulcer, unspecified as acute or chronic, without hemorrhage or perforation: Secondary | ICD-10-CM

## 2022-01-13 DIAGNOSIS — K2100 Gastro-esophageal reflux disease with esophagitis, without bleeding: Secondary | ICD-10-CM

## 2022-01-14 ENCOUNTER — Ambulatory Visit: Payer: Medicare PPO | Admitting: Internal Medicine

## 2022-01-14 VITALS — BP 128/78 | HR 65 | Temp 98.0°F | Ht 59.0 in | Wt 131.0 lb

## 2022-01-14 DIAGNOSIS — R5383 Other fatigue: Secondary | ICD-10-CM

## 2022-01-14 DIAGNOSIS — N1832 Chronic kidney disease, stage 3b: Secondary | ICD-10-CM

## 2022-01-14 LAB — BASIC METABOLIC PANEL
BUN: 31 mg/dL — ABNORMAL HIGH (ref 6–23)
CO2: 29 mEq/L (ref 19–32)
Calcium: 9.7 mg/dL (ref 8.4–10.5)
Chloride: 104 mEq/L (ref 96–112)
Creatinine, Ser: 1.39 mg/dL — ABNORMAL HIGH (ref 0.40–1.20)
GFR: 33.61 mL/min — ABNORMAL LOW (ref >60.00)
Glucose, Bld: 93 mg/dL (ref 70–99)
Potassium: 3.9 mEq/L (ref 3.5–5.1)
Sodium: 142 mEq/L (ref 135–145)

## 2022-01-14 NOTE — Progress Notes (Signed)
   Subjective:   Patient ID: Leah [name], female    DOB: 06-20-1929, 88 y.o.   MRN: [id number]  HPI The patient is an 88 YO female coming in for follow up.  Review of Systems  Constitutional: Negative.   HENT: Negative.    Eyes: Negative.   Respiratory:  Negative for cough, chest tightness and shortness of breath.   Cardiovascular:  Negative for chest pain, palpitations and leg swelling.  Gastrointestinal:  Negative for abdominal distention, abdominal pain, constipation, diarrhea, nausea and vomiting.  Musculoskeletal: Negative.   Skin: Negative.   Neurological: Negative.   Psychiatric/Behavioral: Negative.      Objective:  Physical Exam Constitutional:      Appearance: She is well-developed.  HENT:     Head: Normocephalic and atraumatic.  Cardiovascular:     Rate and Rhythm: Normal rate and regular rhythm.  Pulmonary:     Effort: Pulmonary effort is normal. No respiratory distress.     Breath sounds: Normal breath sounds. No wheezing or rales.  Abdominal:     General: Bowel sounds are normal. There is no distension.     Palpations: Abdomen is soft.     Tenderness: There is no abdominal tenderness. There is no rebound.  Musculoskeletal:     Cervical back: Normal range of motion.  Skin:    General: Skin is warm and dry.  Neurological:     Mental Status: She is alert and oriented to person, place, and time.     Coordination: Coordination normal.     Vitals:   01/14/22 1424  BP: 128/78  Pulse: 65  Temp: 98 F (36.7 C)  TempSrc: Oral  SpO2: 94%  Weight: 131 lb (59.4 kg)  Height: '4\' 11"'$  (1.499 m)    Assessment & Plan:

## 2022-01-14 NOTE — Patient Instructions (Signed)
We will check the kidney numbers today. Get the covid and flu in September or October.

## 2022-01-17 ENCOUNTER — Encounter: Payer: Self-pay | Admitting: Internal Medicine

## 2022-01-17 NOTE — Assessment & Plan Note (Signed)
Improving significantly now that she is home and moving around more. Almost back to baseline function.

## 2022-01-17 NOTE — Assessment & Plan Note (Signed)
Overall improving and will check BMP today. Likely could be several months before this is either back to her baseline or back to her new baseline. Has been improving steadily.

## 2022-01-25 DIAGNOSIS — M1990 Unspecified osteoarthritis, unspecified site: Secondary | ICD-10-CM | POA: Diagnosis not present

## 2022-01-25 DIAGNOSIS — K219 Gastro-esophageal reflux disease without esophagitis: Secondary | ICD-10-CM | POA: Diagnosis not present

## 2022-01-25 DIAGNOSIS — I10 Essential (primary) hypertension: Secondary | ICD-10-CM | POA: Diagnosis not present

## 2022-01-25 DIAGNOSIS — K2270 Barrett's esophagus without dysplasia: Secondary | ICD-10-CM | POA: Diagnosis not present

## 2022-01-25 DIAGNOSIS — H409 Unspecified glaucoma: Secondary | ICD-10-CM | POA: Diagnosis not present

## 2022-01-25 DIAGNOSIS — H547 Unspecified visual loss: Secondary | ICD-10-CM | POA: Diagnosis not present

## 2022-01-25 DIAGNOSIS — Z791 Long term (current) use of non-steroidal anti-inflammatories (NSAID): Secondary | ICD-10-CM | POA: Diagnosis not present

## 2022-01-25 DIAGNOSIS — M069 Rheumatoid arthritis, unspecified: Secondary | ICD-10-CM | POA: Diagnosis not present

## 2022-01-25 DIAGNOSIS — R32 Unspecified urinary incontinence: Secondary | ICD-10-CM | POA: Diagnosis not present

## 2022-01-27 ENCOUNTER — Other Ambulatory Visit: Payer: Self-pay | Admitting: Internal Medicine

## 2022-01-31 NOTE — Telephone Encounter (Signed)
Patient called back concerning this rx.  Please advise.

## 2022-02-17 ENCOUNTER — Other Ambulatory Visit: Payer: Self-pay | Admitting: Gastroenterology

## 2022-02-17 DIAGNOSIS — K259 Gastric ulcer, unspecified as acute or chronic, without hemorrhage or perforation: Secondary | ICD-10-CM

## 2022-02-17 DIAGNOSIS — K2100 Gastro-esophageal reflux disease with esophagitis, without bleeding: Secondary | ICD-10-CM

## 2022-03-12 ENCOUNTER — Ambulatory Visit: Payer: Medicare PPO | Admitting: Internal Medicine

## 2022-03-12 ENCOUNTER — Encounter: Payer: Self-pay | Admitting: Internal Medicine

## 2022-03-12 VITALS — BP 112/62 | HR 76 | Temp 97.6°F | Ht 59.0 in | Wt 128.0 lb

## 2022-03-12 DIAGNOSIS — R5383 Other fatigue: Secondary | ICD-10-CM | POA: Diagnosis not present

## 2022-03-12 LAB — CBC
HCT: 34.2 % — ABNORMAL LOW (ref 36.0–46.0)
Hemoglobin: 11.6 g/dL — ABNORMAL LOW (ref 12.0–15.0)
MCHC: 33.9 g/dL (ref 30.0–36.0)
MCV: 89.9 fl (ref 78.0–100.0)
Platelets: 277 10*3/uL (ref 150.0–400.0)
RBC: 3.8 Mil/uL — ABNORMAL LOW (ref 3.87–5.11)
RDW: 14.2 % (ref 11.5–15.5)
WBC: 7.3 10*3/uL (ref 4.0–10.5)

## 2022-03-12 LAB — COMPREHENSIVE METABOLIC PANEL
ALT: 15 U/L (ref 0–35)
AST: 17 U/L (ref 0–37)
Albumin: 3.6 g/dL (ref 3.5–5.2)
Alkaline Phosphatase: 70 U/L (ref 39–117)
BUN: 29 mg/dL — ABNORMAL HIGH (ref 6–23)
CO2: 28 mEq/L (ref 19–32)
Calcium: 9.6 mg/dL (ref 8.4–10.5)
Chloride: 103 mEq/L (ref 96–112)
Creatinine, Ser: 1.18 mg/dL (ref 0.40–1.20)
GFR: 40.87 mL/min — ABNORMAL LOW (ref >60.00)
Glucose, Bld: 107 mg/dL — ABNORMAL HIGH (ref 70–99)
Potassium: 3.9 mEq/L (ref 3.5–5.1)
Sodium: 140 mEq/L (ref 135–145)
Total Bilirubin: 0.4 mg/dL (ref 0.2–1.2)
Total Protein: 6.7 g/dL (ref 6.0–8.3)

## 2022-03-12 LAB — VITAMIN D 25 HYDROXY (VIT D DEFICIENCY, FRACTURES): VITD: 95.43 ng/mL (ref 30.00–100.00)

## 2022-03-12 LAB — VITAMIN B12: Vitamin B-12: >1500 pg/mL — ABNORMAL HIGH (ref 211–911)

## 2022-03-12 MED ORDER — MIRTAZAPINE 7.5 MG PO TABS: 7.5000 mg | ORAL_TABLET | Freq: Every day | ORAL | 6 refills | Status: DC

## 2022-03-12 MED ORDER — MIRTAZAPINE 7.5 MG PO TABS: 7.5000 mg | ORAL_TABLET | Freq: Every day | ORAL | 6 refills | Status: AC

## 2022-03-12 NOTE — Progress Notes (Signed)
   Subjective:   Patient ID: Leah [name], female    DOB: Dec 15, 1929, 88 y.o.   MRN: [id number]  HPI The patient is an 88 female coming in for follow up and ongoing fatigue/tiredness. Some problems sleeping and poor appetite.   Review of Systems  Constitutional:  Positive for activity change, appetite change and fatigue.  HENT: Negative.    Eyes: Negative.   Respiratory:  Negative for cough, chest tightness and shortness of breath.   Cardiovascular:  Negative for chest pain, palpitations and leg swelling.  Gastrointestinal:  Negative for abdominal distention, abdominal pain, constipation, diarrhea, nausea and vomiting.  Musculoskeletal: Negative.   Skin: Negative.   Neurological: Negative.   Psychiatric/Behavioral:  Positive for sleep disturbance.     Objective:  Physical Exam Constitutional:      Appearance: She is well-developed.  HENT:     Head: Normocephalic and atraumatic.  Cardiovascular:     Rate and Rhythm: Normal rate and regular rhythm.  Pulmonary:     Effort: Pulmonary effort is normal. No respiratory distress.     Breath sounds: Normal breath sounds. No wheezing or rales.  Abdominal:     General: Bowel sounds are normal. There is no distension.     Palpations: Abdomen is soft.     Tenderness: There is no abdominal tenderness. There is no rebound.  Musculoskeletal:     Cervical back: Normal range of motion.  Skin:    General: Skin is warm and dry.  Neurological:     Mental Status: She is alert and oriented to person, place, and time.     Coordination: Coordination abnormal.     Vitals:   03/12/22 0956  BP: 112/62  Pulse: 76  Temp: 97.6 F (36.4 C)  SpO2: 98%  Weight: 128 lb (58.1 kg)  Height: '4\' 11"'$  (1.499 m)    Assessment & Plan:

## 2022-03-12 NOTE — Assessment & Plan Note (Signed)
Checking vitamin D, B12, CBC, CMP. Rx remeron 7.5 mg qhs to help with sleep, appetite and perhaps energy. She is working with trainer again once a week and this may help with energy. She had stopped for several years due ot pandemic.

## 2022-03-12 NOTE — Patient Instructions (Signed)
We have sent in mirtazapine to take at night time 1 pill to help with sleep and appetite and energy.

## 2022-03-20 ENCOUNTER — Other Ambulatory Visit: Payer: Self-pay | Admitting: Gastroenterology

## 2022-03-20 DIAGNOSIS — K2100 Gastro-esophageal reflux disease with esophagitis, without bleeding: Secondary | ICD-10-CM

## 2022-03-20 DIAGNOSIS — K259 Gastric ulcer, unspecified as acute or chronic, without hemorrhage or perforation: Secondary | ICD-10-CM

## 2022-04-09 ENCOUNTER — Encounter: Payer: Self-pay | Admitting: Obstetrics & Gynecology

## 2022-04-09 ENCOUNTER — Ambulatory Visit (INDEPENDENT_AMBULATORY_CARE_PROVIDER_SITE_OTHER): Payer: Medicare PPO | Admitting: Obstetrics & Gynecology

## 2022-04-09 VITALS — BP 118/78 | HR 75 | Ht 58.5 in | Wt 132.0 lb

## 2022-04-09 DIAGNOSIS — M810 Age-related osteoporosis without current pathological fracture: Secondary | ICD-10-CM | POA: Diagnosis not present

## 2022-04-09 DIAGNOSIS — Z780 Asymptomatic menopausal state: Secondary | ICD-10-CM | POA: Diagnosis not present

## 2022-04-09 NOTE — Progress Notes (Unsigned)
    Leah [name] 28-May-1930 [id number]        88 y.o.  G2P2L2   RP: Counseling and management of Osteoporosis  HPI: Postmenopause, well on no HRT.  No PMB.  No pelvic pain.  Last BD in 2019 Osteoporosis.  Late on receiving the Prolia.  Will receive her Prolia treatment here going forward.  Ca++ normal at 9.6 on 03/12/2022.    OB History  Gravida Para Term Preterm AB Living  '2 2 2     2  '$ SAB IAB Ectopic Multiple Live Births               # Outcome Date GA Lbr Len/2nd Weight Sex Delivery Anes PTL Lv  2 Term           1 Term             Past medical history,surgical history, problem list, medications, allergies, family history and social history were all reviewed and documented in the EPIC chart.   Directed ROS with pertinent positives and negatives documented in the history of present illness/assessment and plan.  Exam:  Vitals:   04/09/22 1403  BP: 118/78  Pulse: 75  SpO2: 98%  Weight: 132 lb (59.9 kg)  Height: 4' 10.5" (1.486 m)   General appearance:  Normal  Breast exam normal bilaterally.  No Axillary LN felt.  Abdomen: Normal  Gynecologic exam: Vulva normal.  Bimanual exam:  Uterus AV, mobile, NT.  No adnexal mass felt.  Bone Density 2019: Osteoporosis at Total Left Forearm T-Score -3.2  Ca++ 9.6 03/12/2022   Assessment/Plan:  88 y.o. G2P2002   1. Postmenopausal osteoporosis Last BD in 2019 Osteoporosis.  Late on receiving the Prolia.  Will receive her Prolia treatment here going forward.  Ca++ normal at 9.6 on 03/12/2022.  F/U BD here now.   - DG Bone Density; Future  2. Postmenopause  Well on no HRT.  No PMB.  Princess Bruins MD, 2:21 PM 04/09/2022

## 2022-04-10 ENCOUNTER — Encounter: Payer: Self-pay | Admitting: Obstetrics & Gynecology

## 2022-04-10 ENCOUNTER — Telehealth: Payer: Self-pay | Admitting: *Deleted

## 2022-04-14 ENCOUNTER — Other Ambulatory Visit: Payer: Self-pay | Admitting: Gastroenterology

## 2022-04-14 DIAGNOSIS — K259 Gastric ulcer, unspecified as acute or chronic, without hemorrhage or perforation: Secondary | ICD-10-CM

## 2022-04-14 DIAGNOSIS — K2100 Gastro-esophageal reflux disease with esophagitis, without bleeding: Secondary | ICD-10-CM

## 2022-04-16 DIAGNOSIS — H401131 Primary open-angle glaucoma, bilateral, mild stage: Secondary | ICD-10-CM | POA: Diagnosis not present

## 2022-04-16 DIAGNOSIS — H04123 Dry eye syndrome of bilateral lacrimal glands: Secondary | ICD-10-CM | POA: Diagnosis not present

## 2022-04-16 DIAGNOSIS — H18593 Other hereditary corneal dystrophies, bilateral: Secondary | ICD-10-CM | POA: Diagnosis not present

## 2022-04-16 DIAGNOSIS — H26493 Other secondary cataract, bilateral: Secondary | ICD-10-CM | POA: Diagnosis not present

## 2022-05-15 ENCOUNTER — Ambulatory Visit (INDEPENDENT_AMBULATORY_CARE_PROVIDER_SITE_OTHER): Payer: Medicare PPO

## 2022-05-15 VITALS — Ht 58.5 in | Wt 127.0 lb

## 2022-05-15 DIAGNOSIS — Z0000 Encounter for general adult medical examination without abnormal findings: Secondary | ICD-10-CM | POA: Diagnosis not present

## 2022-05-15 NOTE — Progress Notes (Addendum)
Virtual Visit via Telephone Note  I connected with  Leah [name] on 05/15/22 at 11:15 AM EST by telephone and verified that I am speaking with the correct person using two identifiers.  Location: Patient: Home Provider: [facility] Persons participating in the virtual visit: [name]   I discussed the limitations, risks, security and privacy concerns of performing an evaluation and management service by telephone and the availability of in person appointments. The patient expressed understanding and agreed to proceed.  Interactive audio and video telecommunications were attempted between this nurse and patient, however failed, due to patient having technical difficulties OR patient did not have access to video capability.  We continued and completed visit with audio only.  Some vital signs may be absent or patient reported.   Sheral Flow, LPN  Subjective:   Leah [name] is a 88 y.o. female who presents for Medicare Annual (Subsequent) preventive examination.  Review of Systems     Cardiac Risk Factors include: advanced age (>55mn, >>38women);hypertension;family history of premature cardiovascular disease     Objective:    Today's Vitals   05/15/22 1118  Weight: 127 lb (57.6 kg)  Height: 4' 10.5" (1.486 m)  PainSc: 0-No pain  PainLoc: Leg   Body mass index is 26.09 kg/m.     05/15/2022   11:30 AM 10/31/2021    7:27 PM 05/14/2021    3:46 PM 06/07/2020    7:26 AM 03/27/2020   11:31 AM 03/08/2019   12:09 PM 02/04/2018    2:42 PM  Advanced Directives  Does Patient Have a Medical Advance Directive? Yes No Yes Yes Yes Yes No  Type of AParamed[place]of [place] will  Living will;Healthcare Power of [place] will [place] will   Does patient want to make changes to medical advance directive?   No - Patient declined No -  Patient declined No - Patient declined    Copy of [place] Chart? No - copy requested  No - copy requested No - copy requested No - copy requested No - copy requested   Would patient like information on creating a medical advance directive?  No - Patient declined     No - Patient declined    Current Medications (verified) Outpatient Encounter Medications as of 05/15/2022  Medication Sig   acetaminophen (TYLENOL) 650 MG CR tablet Take 1,300 mg by mouth every 8 (eight) hours as needed for pain.   amLODipine (NORVASC) 10 MG tablet Take 1 tablet (10 mg total) by mouth daily.   Apoaequorin (PREVAGEN PO) Take 20 mg by mouth daily.   Ascorbic Acid (VITAMIN C) 1000 MG tablet Take 1,000 mg by mouth daily.   aspirin EC 81 MG tablet Take 81 mg by mouth. Swallow whole.   CALCIUM-MAGNESIUM-ZINC PO Take 3 tablets by mouth daily.   Cholecalciferol (DIALYVITE VITAMIN D 5000) 125 MCG (5000 UT) capsule Take 5,000 Units by mouth daily.   diclofenac (VOLTAREN) 75 MG EC tablet TAKE ONE TABLET BY MOUTH TWICE DAILY   latanoprost (XALATAN) 0.005 % ophthalmic solution Place 1 drop into both eyes at bedtime.   multivitamin-lutein (OCUVITE-LUTEIN) CAPS capsule Take 1 capsule by mouth daily.   mupirocin ointment (BACTROBAN) 2 % Apply 1 Application topically 2 (two) times daily.   pantoprazole (PROTONIX) 40 MG tablet TAKE ONE TABLET BY MOUTH TWICE DAILY **NEED OFFICE VISIT**   timolol (TIMOPTIC) 0.5 % ophthalmic  solution Place 1 drop into both eyes 2 (two) times daily.   vitamin B-12 (CYANOCOBALAMIN) 1000 MCG tablet Take 1,000 mcg by mouth daily.   vitamin E 400 UNIT capsule Take 400 Units by mouth daily.   denosumab (PROLIA) 60 MG/ML SOLN injection Inject 60 mg into the skin every 6 (six) months. Administer in upper arm, thigh, or abdomen (Patient not taking: Reported on 04/09/2022)   mirtazapine (REMERON) 7.5 MG tablet Take 1 tablet (7.5 mg total) by mouth at bedtime. (Patient not taking: Reported  on 05/15/2022)   No facility-administered encounter medications on file as of 05/15/2022.    Allergies (verified) Sulfa antibiotics, Sulfasalazine, Nabumetone, Naproxen sodium, and Tramadol hcl   History: Past Medical History:  Diagnosis Date   Acute maxillary sinusitis    Allergy    Bundle branch block, unspecified    EKG 4/13 with clearance and note that isnt new block Dr Arnoldo Morale on chart   Carpal tunnel syndrome    Cystocele    Detrusor instability    GERD (gastroesophageal reflux disease)    Glaucoma    Hyperlipidemia    Localized osteoarthrosis not specified whether primary or secondary, lower leg    Osteoporosis 01/2014   T score -2.5 UD forarm, stable at the spine recommend 2 year follow up DEXA   PONV (postoperative nausea and vomiting)    19 yrs ago- states has had general anesthesia since and did well   Unspecified adverse effect of unspecified drug, medicinal and biological substance    Uterine prolapse    Past Surgical History:  Procedure Laterality Date   arthroscopic r knee     BALLOON DILATION N/A 11/05/2021   Procedure: BALLOON DILATION;  Surgeon: Irving Copas., MD;  Location: [place];  Service: Gastroenterology;  Laterality: N/A;   BIOPSY  11/05/2021   Procedure: BIOPSY;  Surgeon: Rush Landmark Telford Nab., MD;  Location: [place];  Service: Gastroenterology;;   Wilmon Pali RELEASE  2011   CHOLECYSTECTOMY N/A 11/06/2021   Procedure: LAPAROSCOPIC CHOLECYSTECTOMY;  Surgeon: Ileana Roup, MD;  Location: [place];  Service: General;  Laterality: N/A;   CHOLECYSTECTOMY  11/06/2021   Procedure: Intraoperative cholangiogram with ICG;  Surgeon: Ileana Roup, MD;  Location: [place];  Service: General;;   COLONOSCOPY     DILATION AND CURETTAGE OF UTERUS  1982   ERCP N/A 11/05/2021   Procedure: ENDOSCOPIC RETROGRADE CHOLANGIOPANCREATOGRAPHY (ERCP);  Surgeon: Irving Copas., MD;  Location: [place];  Service: Gastroenterology;   Laterality: N/A;   EYE SURGERY     bilateral cataract extraction  with IOL   FLEXIBLE SIGMOIDOSCOPY     HYSTEROSCOPY     HYSTEROSCOPY WITH D & C  2005 and 2008   IR SACROPLASTY BILATERAL  06/07/2020   JOINT REPLACEMENT     bilateral total hip arthroplasty   REMOVAL OF STONES  11/05/2021   Procedure: REMOVAL OF STONES;  Surgeon: Irving Copas., MD;  Location: [place];  Service: Gastroenterology;;   Joan Mayans  11/05/2021   Procedure: Joan Mayans;  Surgeon: Irving Copas., MD;  Location: [place];  Service: Gastroenterology;;   total hip rerplacement bilateral     TOTAL KNEE ARTHROPLASTY  10/13/2011   Procedure: TOTAL KNEE ARTHROPLASTY;  Surgeon: Gearlean Alf, MD;  Location: [facility];  Service: Orthopedics;  Laterality: Right;   UPPER GI ENDOSCOPY  07/2018 and 3/20   Family History  Problem Relation Age of Onset   Dementia Mother    Breast cancer Mother 67  Heart disease Father    Stroke Father    Dementia Sister    Colon cancer Neg Hx    Stomach cancer Neg Hx    Rectal cancer Neg Hx    Esophageal cancer Neg Hx    Colon polyps Neg Hx    Pancreatic cancer Neg Hx    Social History   Socioeconomic History   Marital status: Widowed    Spouse name: Not on file   Number of children: 2   Years of education: Not on file   Highest education level: Not on file  Occupational History   Occupation: retired  Tobacco Use   Smoking status: Former    Types: Cigarettes    Quit date: 10/06/1958    Years since quitting: 63.6   Smokeless tobacco: Never  Vaping Use   Vaping Use: Never used  Substance and Sexual Activity   Alcohol use: Not Currently   Drug use: No   Sexual activity: Not Currently    Birth control/protection: Post-menopausal    Comment: 1st intercourse 68 yo-1 partner  Other Topics Concern   Not on file  Social History Narrative   Not on file   Social Determinants of Health   Financial Resource Strain: Low Risk  (05/15/2022)    Overall Financial Resource Strain (CARDIA)    Difficulty of Paying Living Expenses: Not hard at all  Food Insecurity: No [place] (05/15/2022)   Hunger Vital Sign    Worried About Running Out of Food in the Last Year: Never true    [place] in the Last Year: Never true  Transportation Needs: No Transportation Needs (05/15/2022)   PRAPARE - Hydrologist (Medical): No    Lack of Transportation (Non-Medical): No  Physical Activity: Sufficiently Active (05/15/2022)   Exercise Vital Sign    Days of Exercise per Week: 5 days    Minutes of Exercise per Session: 30 min  Stress: No Stress Concern Present (05/15/2022)   [place]    Feeling of Stress : Not at all  Social Connections: Moderately Integrated (05/15/2022)   Social Connection and Isolation Panel [NHANES]    Frequency of Communication with Friends and Family: More than three times a week    Frequency of Social Gatherings with Friends and Family: More than three times a week    Attends Religious Services: More than 4 times per year    Active Member of [employer] or Organizations: Yes    Attends Archivist Meetings: More than 4 times per year    Marital Status: Widowed    Tobacco Counseling Counseling given: Not Answered   Clinical Intake:  Pre-visit preparation completed: Yes  Pain : No/denies pain Pain Score: 0-No pain     BMI - recorded: 26.09 Nutritional Risks: None Diabetes: No  How often do you need to have someone help you when you read instructions, pamphlets, or other written materials from your doctor or pharmacy?: 1 - Never What is the last grade level you completed in school?: HSG; 1 year of college  Diabetic? No  Interpreter Needed?: No  Information entered by :: Lisette Abu, LPN.   Activities of Daily Living    05/15/2022   11:35 AM 11/01/2021   12:30 AM  In your present state of  health, do you have any difficulty performing the following activities:  Hearing? 0 0  Vision? 0 0  Difficulty concentrating or making decisions?  0 1  Walking or climbing stairs? 0 0  Dressing or bathing? 0 0  Doing errands, shopping? 0 0  Preparing Food and eating ? N   Using the Toilet? N   In the past six months, have you accidently leaked urine? Y   Do you have problems with loss of bowel control? N   Managing your Medications? N   Managing your Finances? N   Housekeeping or managing your Housekeeping? N     Patient Care Team: Hoyt Koch, MD as PCP - General (Internal Medicine) Fontaine, Belinda Block, MD (Inactive) as Consulting Physician (Gynecology) Marygrace Drought, MD as Consulting Physician (Ophthalmology) Ladene Artist, MD as Consulting Physician (Gastroenterology)  Indicate any recent Medical Services you may have received from other than [facility] providers in the past year (date may be approximate).     Assessment:   This is a routine wellness examination for Leah [name].  Hearing/Vision screen Hearing Screening - Comments:: Denies hearing difficulties   Vision Screening - Comments:: Wears rx glasses - up to date with routine eye exams with  Marygrace Drought, MD.  Dietary issues and exercise activities discussed: Current Exercise Habits: Home exercise routine;Structured exercise class, Type of exercise: walking;Other - see comments (personal trainer, leg exercises), Time (Minutes): 30, Frequency (Times/Week): 5, Weekly Exercise (Minutes/Week): 150, Intensity: Mild, Exercise limited by: orthopedic condition(s);cardiac condition(s)   Goals Addressed   None   Depression Screen    05/15/2022   11:35 AM 05/14/2021    4:07 PM 03/27/2020   11:30 AM 03/08/2019   12:31 PM 07/16/2018    1:54 PM 05/21/2017    5:06 PM 05/21/2017    4:15 PM  PHQ 2/9 Scores  PHQ - 2 Score 0 0 0 1 0 0 0    Fall Risk    05/15/2022   11:35 AM 05/14/2021    4:09 PM 07/02/2020   11:11  AM 03/28/2020   11:29 AM 03/27/2020   11:31 AM  [place] in the past year? 0 0 '1 1 1  '$ Number falls in past yr: 0 0 0 0 0  Injury with Fall? 0 0 1 0 0  Risk for fall due to : No Fall Risks Impaired balance/gait  Impaired mobility Impaired balance/gait  Risk for fall due to: Comment  uses a cane     Follow up Falls prevention discussed Falls prevention discussed  Falls evaluation completed Falls evaluation completed    FALL RISK PREVENTION PERTAINING TO THE HOME:  Any stairs in or around the home? Yes  If so, are there any without handrails? No  Home free of loose throw rugs in walkways, pet beds, electrical cords, etc? No  Adequate lighting in your home to reduce risk of falls? No   ASSISTIVE DEVICES UTILIZED TO PREVENT FALLS:  Life alert? No  Use of a cane, walker or w/c? Yes  Grab bars in the bathroom? Yes  Shower chair or bench in shower? No  Elevated toilet seat or a handicapped toilet? No   TIMED UP AND GO:  Was the test performed? No; Phone Visit  Cognitive Function:    03/08/2019   12:17 PM  MMSE - Mini Mental State Exam  Not completed: Refused        05/15/2022   11:39 AM  6CIT Screen  What Year? 0 points  What month? 0 points  What time? 0 points  Count back from 20 0 points  Months in reverse 0 points  Repeat phrase 0 points  Total Score 0 points    Immunizations Immunization History  Administered Date(s) Administered   Fluad Quad(high Dose 65+) 02/22/2019, 02/01/2021, 03/04/2022   Influenza Split 03/13/2011   Influenza Whole 06/02/2004, 04/02/2007, 02/24/2008, 03/30/2009, 02/14/2010   Influenza, High Dose Seasonal PF 03/06/2014, 02/03/2018   Influenza,inj,Quad PF,6+ Mos 03/12/2017   Influenza-Unspecified 03/11/2013, 03/03/2015, 03/18/2020   PFIZER Comirnaty(Gray Top)Covid-19 Tri-Sucrose Vaccine 03/04/2022   PFIZER(Purple Top)SARS-COV-2 Vaccination 06/21/2019, 07/11/2019, 03/18/2020   Pfizer Covid-19 Vaccine Bivalent Booster 72yr & up  04/08/2021   Pneumococcal Conjugate-13 03/09/2019   Pneumococcal Polysaccharide-23 06/02/2005, 09/08/2013   Td 06/02/2001, 09/24/2011    TDAP status: Due, Education has been provided regarding the importance of this vaccine. Advised may receive this vaccine at local pharmacy or Health Dept. Aware to provide a copy of the vaccination record if obtained from local pharmacy or Health Dept. Verbalized acceptance and understanding.  Flu Vaccine status: Up to date  Pneumococcal vaccine status: Up to date  Covid-19 vaccine status: Completed vaccines  Qualifies for Shingles Vaccine? Yes   Zostavax completed No   Shingrix Completed?: No.    Education has been provided regarding the importance of this vaccine. Patient has been advised to call insurance company to determine out of pocket expense if they have not yet received this vaccine. Advised may also receive vaccine at local pharmacy or Health Dept. Verbalized acceptance and understanding.  Screening Tests Health Maintenance  Topic Date Due   Zoster Vaccines- Shingrix (1 of 2) Never done   DTaP/Tdap/Td (3 - Tdap) 09/23/2021   COVID-19 Vaccine (6 - 2023-24 season) 04/29/2022   Medicare Annual Wellness (AWV)  05/16/2023   Pneumonia Vaccine 88 Years old  Completed   INFLUENZA VACCINE  Completed   DEXA SCAN  Completed   HPV VACCINES  Aged Out    Health Maintenance  Health Maintenance Due  Topic Date Due   Zoster Vaccines- Shingrix (1 of 2) Never done   DTaP/Tdap/Td (3 - Tdap) 09/23/2021   COVID-19 Vaccine (6 - 2023-24 season) 04/29/2022    Colorectal cancer screening: No longer required.   Mammogram status: Completed 04/09/2020. Repeat every year  Bone Density status: Completed 04/05/2018. Results reflect: Bone density results: OSTEOPOROSIS. Repeat every 2 years.  Lung Cancer Screening: (Low Dose CT Chest recommended if Age 86-80years, 30 pack-year currently smoking OR have quit w/in 15years.) does not qualify.   Lung Cancer  Screening Referral: no  Additional Screening:  Hepatitis C Screening: does not qualify; Completed no  Vision Screening: Recommended annual ophthalmology exams for early detection of glaucoma and other disorders of the eye. Is the patient up to date with their annual eye exam?  Yes  Who is the provider or what is the name of the office in which the patient attends annual eye exams? MMarygrace Drought MD. If pt is not established with a provider, would they like to be referred to a provider to establish care? No .   Dental Screening: Recommended annual dental exams for proper oral hygiene  Community Resource Referral / Chronic Care Management: CRR required this visit?  No   CCM required this visit?  No      Plan:     I have personally reviewed and noted the following in the patient's chart:   Medical and social history Use of alcohol, tobacco or illicit drugs  Current medications and supplements including opioid prescriptions. Patient is not currently taking opioid prescriptions. Functional ability and status Nutritional status Physical activity Advanced  directives List of other physicians Hospitalizations, surgeries, and ER visits in previous 12 months Vitals Screenings to include cognitive, depression, and falls Referrals and appointments  In addition, I have reviewed and discussed with patient certain preventive protocols, quality metrics, and best practice recommendations. A written personalized care plan for preventive services as well as general preventive health recommendations were provided to patient.     Sheral Flow, LPN   [id number]   Nurse Notes: N/A

## 2022-05-15 NOTE — Patient Instructions (Addendum)
Leah [name] , Thank you for taking time to come for your Medicare Wellness Visit. I appreciate your ongoing commitment to your health goals. Please review the following plan we discussed and let me know if I can assist you in the future.   These are the goals we discussed:  Goals      Patient Stated     Maintain current health status and stay physically and socially active.         Immunization History  Administered Date(s) Administered   Fluad Quad(high Dose 65+) 02/22/2019, 02/01/2021, 03/04/2022   Influenza Split 03/13/2011   Influenza Whole 06/02/2004, 04/02/2007, 02/24/2008, 03/30/2009, 02/14/2010   Influenza, High Dose Seasonal PF 03/06/2014, 02/03/2018   Influenza,inj,Quad PF,6+ Mos 03/12/2017   Influenza-Unspecified 03/11/2013, 03/03/2015, 03/18/2020   PFIZER Comirnaty(Gray Top)Covid-19 Tri-Sucrose Vaccine 03/04/2022   PFIZER(Purple Top)SARS-COV-2 Vaccination 06/21/2019, 07/11/2019, 03/18/2020   Pfizer Covid-19 Vaccine Bivalent Booster 75yr & up 04/08/2021   Pneumococcal Conjugate-13 03/09/2019   Pneumococcal Polysaccharide-23 06/02/2005, 09/08/2013   Td 06/02/2001, 09/24/2011    Advanced directives: Yes  Conditions/risks identified: Yes  Next appointment: Follow up in one year for your annual wellness visit.   Preventive Care 667Years and Older, Female Preventive care refers to lifestyle choices and visits with your health care provider that can promote health and wellness. What does preventive care include? A yearly physical exam. This is also called an annual well check. Dental exams once or twice a year. Routine eye exams. Ask your health care provider how often you should have your eyes checked. Personal lifestyle choices, including: Daily care of your teeth and gums. Regular physical activity. Eating a healthy diet. Avoiding tobacco and drug use. Limiting alcohol use. Practicing safe sex. Taking low-dose aspirin every day. Taking vitamin and mineral  supplements as recommended by your health care provider. What happens during an annual well check? The services and screenings done by your health care provider during your annual well check will depend on your age, overall health, lifestyle risk factors, and family history of disease. Counseling  Your health care provider may ask you questions about your: Alcohol use. Tobacco use. Drug use. Emotional well-being. Home and relationship well-being. Sexual activity. Eating habits. History of falls. Memory and ability to understand (cognition). Work and work [employer] Reproductive health. Screening  You may have the following tests or measurements: Height, weight, and BMI. Blood pressure. Lipid and cholesterol levels. These may be checked every 5 years, or more frequently if you are over 54years old. Skin check. Lung cancer screening. You may have this screening every year starting at age 3657if you have a 30-pack-year history of smoking and currently smoke or have quit within the past 15 years. Fecal occult blood test (FOBT) of the stool. You may have this test every year starting at age 86 Flexible sigmoidoscopy or colonoscopy. You may have a sigmoidoscopy every 5 years or a colonoscopy every 10 years starting at age 86 Hepatitis C blood test. Hepatitis B blood test. Sexually transmitted disease (STD) testing. Diabetes screening. This is done by checking your blood sugar (glucose) after you have not eaten for a while (fasting). You may have this done every 1-3 years. Bone density scan. This is done to screen for osteoporosis. You may have this done starting at age 86 Mammogram. This may be done every 1-2 years. Talk to your health care provider about how often you should have regular mammograms. Talk with your health care provider about your test results, treatment options,  and if necessary, the need for more tests. Vaccines  Your health care provider may recommend certain  vaccines, such as: Influenza vaccine. This is recommended every year. Tetanus, diphtheria, and acellular pertussis (Tdap, Td) vaccine. You may need a Td booster every 10 years. Zoster vaccine. You may need this after age 6. Pneumococcal 13-valent conjugate (PCV13) vaccine. One dose is recommended after age 98. Pneumococcal polysaccharide (PPSV23) vaccine. One dose is recommended after age 74. Talk to your health care provider about which screenings and vaccines you need and how often you need them. This information is not intended to replace advice given to you by your health care provider. Make sure you discuss any questions you have with your health care provider. Document Released: 06/15/2015 Document Revised: 02/06/2016 Document Reviewed: 03/20/2015 Elsevier Interactive Patient Education  2017 [employer] Prevention in the Home Falls can cause injuries. They can happen to people of all ages. There are many things you can do to make your home safe and to help prevent falls. What can I do on the outside of my home? Regularly fix the edges of walkways and driveways and fix any cracks. Remove anything that might make you trip as you walk through a door, such as a raised step or threshold. Trim any bushes or trees on the path to your home. Use bright outdoor lighting. Clear any walking paths of anything that might make someone trip, such as rocks or tools. Regularly check to see if handrails are loose or broken. Make sure that both sides of any steps have handrails. Any raised decks and porches should have guardrails on the edges. Have any leaves, snow, or ice cleared regularly. Use sand or salt on walking paths during winter. Clean up any spills in your garage right away. This includes oil or grease spills. What can I do in the bathroom? Use night lights. Install grab bars by the toilet and in the tub and shower. Do not use towel bars as grab bars. Use non-skid mats or decals in  the tub or shower. If you need to sit down in the shower, use a plastic, non-slip stool. Keep the floor dry. Clean up any water that spills on the floor as soon as it happens. Remove soap buildup in the tub or shower regularly. Attach bath mats securely with double-sided non-slip rug tape. Do not have throw rugs and other things on the floor that can make you trip. What can I do in the bedroom? Use night lights. Make sure that you have a light by your bed that is easy to reach. Do not use any sheets or blankets that are too big for your bed. They should not hang down onto the floor. Have a firm chair that has side arms. You can use this for support while you get dressed. Do not have throw rugs and other things on the floor that can make you trip. What can I do in the kitchen? Clean up any spills right away. Avoid walking on wet floors. Keep items that you use a lot in easy-to-reach places. If you need to reach something above you, use a strong step stool that has a grab bar. Keep electrical cords out of the way. Do not use floor polish or wax that makes floors slippery. If you must use wax, use non-skid floor wax. Do not have throw rugs and other things on the floor that can make you trip. What can I do with my stairs? Do not leave  any items on the stairs. Make sure that there are handrails on both sides of the stairs and use them. Fix handrails that are broken or loose. Make sure that handrails are as long as the stairways. Check any carpeting to make sure that it is firmly attached to the stairs. Fix any carpet that is loose or worn. Avoid having throw rugs at the top or bottom of the stairs. If you do have throw rugs, attach them to the floor with carpet tape. Make sure that you have a light switch at the top of the stairs and the bottom of the stairs. If you do not have them, ask someone to add them for you. What else can I do to help prevent falls? Wear shoes that: Do not have high  heels. Have rubber bottoms. Are comfortable and fit you well. Are closed at the toe. Do not wear sandals. If you use a stepladder: Make sure that it is fully opened. Do not climb a closed stepladder. Make sure that both sides of the stepladder are locked into place. Ask someone to hold it for you, if possible. Clearly mark and make sure that you can see: Any grab bars or handrails. First and last steps. Where the edge of each step is. Use tools that help you move around (mobility aids) if they are needed. These include: Canes. Walkers. Scooters. Crutches. Turn on the lights when you go into a dark area. Replace any light bulbs as soon as they burn out. Set up your furniture so you have a clear path. Avoid moving your furniture around. If any of your floors are uneven, fix them. If there are any pets around you, be aware of where they are. Review your medicines with your doctor. Some medicines can make you feel dizzy. This can increase your chance of falling. Ask your doctor what other things that you can do to help prevent falls. This information is not intended to replace advice given to you by your health care provider. Make sure you discuss any questions you have with your health care provider. Document Released: 03/15/2009 Document Revised: 10/25/2015 Document Reviewed: 06/23/2014 Elsevier Interactive Patient Education  2017 [employer].

## 2022-05-21 ENCOUNTER — Other Ambulatory Visit: Payer: Self-pay | Admitting: Gastroenterology

## 2022-05-21 DIAGNOSIS — K2100 Gastro-esophageal reflux disease with esophagitis, without bleeding: Secondary | ICD-10-CM

## 2022-05-21 DIAGNOSIS — K259 Gastric ulcer, unspecified as acute or chronic, without hemorrhage or perforation: Secondary | ICD-10-CM

## 2022-05-22 ENCOUNTER — Other Ambulatory Visit: Payer: Self-pay | Admitting: Gastroenterology

## 2022-05-22 DIAGNOSIS — K259 Gastric ulcer, unspecified as acute or chronic, without hemorrhage or perforation: Secondary | ICD-10-CM

## 2022-05-22 DIAGNOSIS — K2100 Gastro-esophageal reflux disease with esophagitis, without bleeding: Secondary | ICD-10-CM

## 2022-06-03 ENCOUNTER — Other Ambulatory Visit: Payer: Self-pay | Admitting: Internal Medicine

## 2022-06-05 ENCOUNTER — Telehealth: Payer: Self-pay

## 2022-06-05 NOTE — Telephone Encounter (Signed)
Pt called c/o LLE swelling and requested an appt.  Reviewed pt's chart, returned call for clarification, no answer, lf vm. Pt last seen 9/22.

## 2022-06-13 ENCOUNTER — Telehealth: Payer: Self-pay | Admitting: *Deleted

## 2022-06-13 NOTE — Telephone Encounter (Signed)
See telephone note 06/13/2022

## 2022-06-13 NOTE — Telephone Encounter (Signed)
[employer] insurance verification has been sent awaiting Summary of benefits  

## 2022-06-27 NOTE — Telephone Encounter (Addendum)
Deductible n/a  OOP MAX 4000  Annual exam 04/09/2022  Calcium 9.6            Date 03/12/2022  Upcoming dental procedures   Is Prior Authorization needed [phone]  Pt estimated Cost $40 reference # [id number]  Valid 06/02/2023 auth # [id number]  Lm 06/27/2022

## 2022-06-30 ENCOUNTER — Encounter: Payer: Self-pay | Admitting: Internal Medicine

## 2022-06-30 ENCOUNTER — Ambulatory Visit: Payer: Medicare PPO | Admitting: Internal Medicine

## 2022-06-30 VITALS — BP 140/80 | HR 69 | Temp 97.6°F | Wt 134.0 lb

## 2022-06-30 DIAGNOSIS — R202 Paresthesia of skin: Secondary | ICD-10-CM

## 2022-06-30 DIAGNOSIS — R200 Anesthesia of skin: Secondary | ICD-10-CM | POA: Diagnosis not present

## 2022-06-30 DIAGNOSIS — K259 Gastric ulcer, unspecified as acute or chronic, without hemorrhage or perforation: Secondary | ICD-10-CM | POA: Diagnosis not present

## 2022-06-30 DIAGNOSIS — E785 Hyperlipidemia, unspecified: Secondary | ICD-10-CM

## 2022-06-30 DIAGNOSIS — N1832 Chronic kidney disease, stage 3b: Secondary | ICD-10-CM | POA: Diagnosis not present

## 2022-06-30 DIAGNOSIS — K2100 Gastro-esophageal reflux disease with esophagitis, without bleeding: Secondary | ICD-10-CM

## 2022-06-30 LAB — CBC
HCT: 35.8 % — ABNORMAL LOW (ref 36.0–46.0)
Hemoglobin: 12.1 g/dL (ref 12.0–15.0)
MCHC: 33.8 g/dL (ref 30.0–36.0)
MCV: 88.8 fl (ref 78.0–100.0)
Platelets: 281 10*3/uL (ref 150.0–400.0)
RBC: 4.04 Mil/uL (ref 3.87–5.11)
RDW: 15.4 % (ref 11.5–15.5)
WBC: 7.4 10*3/uL (ref 4.0–10.5)

## 2022-06-30 LAB — COMPREHENSIVE METABOLIC PANEL
ALT: 16 U/L (ref 0–35)
AST: 20 U/L (ref 0–37)
Albumin: 4 g/dL (ref 3.5–5.2)
Alkaline Phosphatase: 78 U/L (ref 39–117)
BUN: 32 mg/dL — ABNORMAL HIGH (ref 6–23)
CO2: 29 mEq/L (ref 19–32)
Calcium: 9.8 mg/dL (ref 8.4–10.5)
Chloride: 105 mEq/L (ref 96–112)
Creatinine, Ser: 1.18 mg/dL (ref 0.40–1.20)
GFR: 40.78 mL/min — ABNORMAL LOW (ref >60.00)
Glucose, Bld: 90 mg/dL (ref 70–99)
Potassium: 4.8 mEq/L (ref 3.5–5.1)
Sodium: 144 mEq/L (ref 135–145)
Total Bilirubin: 0.5 mg/dL (ref 0.2–1.2)
Total Protein: 6.9 g/dL (ref 6.0–8.3)

## 2022-06-30 LAB — LIPID PANEL
Cholesterol: 198 mg/dL (ref 0–200)
HDL: 70.4 mg/dL (ref >39.00)
LDL Cholesterol: 106 mg/dL — ABNORMAL HIGH (ref 0–99)
NonHDL: 127.6
Total CHOL/HDL Ratio: 3
Triglycerides: 107 mg/dL (ref 0.0–149.0)
VLDL: 21.4 mg/dL (ref 0.0–40.0)

## 2022-06-30 LAB — VITAMIN D 25 HYDROXY (VIT D DEFICIENCY, FRACTURES): VITD: 66.78 ng/mL (ref 30.00–100.00)

## 2022-06-30 LAB — VITAMIN B12: Vitamin B-12: >1500 pg/mL — ABNORMAL HIGH (ref 211–911)

## 2022-06-30 MED ORDER — PANTOPRAZOLE SODIUM 40 MG PO TBEC: 40.0000 mg | DELAYED_RELEASE_TABLET | Freq: Every day | ORAL | 3 refills | Status: DC

## 2022-06-30 NOTE — Progress Notes (Signed)
   Subjective:   Patient ID: Leah [name], female    DOB: 1930/01/03, 89 y.o.   MRN: [id number]  HPI The patient is a 89 YO female coming in for follow up. Some numbness in the hands when waking.  Review of Systems  Constitutional: Negative.   HENT: Negative.    Eyes: Negative.   Respiratory:  Negative for cough, chest tightness and shortness of breath.   Cardiovascular:  Negative for chest pain, palpitations and leg swelling.  Gastrointestinal:  Negative for abdominal distention, abdominal pain, constipation, diarrhea, nausea and vomiting.  Musculoskeletal:  Positive for arthralgias and myalgias.  Skin: Negative.   Neurological:  Positive for numbness.  Psychiatric/Behavioral: Negative.      Objective:  Physical Exam Constitutional:      Appearance: She is well-developed.  HENT:     Head: Normocephalic and atraumatic.  Cardiovascular:     Rate and Rhythm: Normal rate and regular rhythm.  Pulmonary:     Effort: Pulmonary effort is normal. No respiratory distress.     Breath sounds: Normal breath sounds. No wheezing or rales.  Abdominal:     General: Bowel sounds are normal. There is no distension.     Palpations: Abdomen is soft.     Tenderness: There is no abdominal tenderness. There is no rebound.  Musculoskeletal:        General: Tenderness present.     Cervical back: Normal range of motion.  Skin:    General: Skin is warm and dry.  Neurological:     Mental Status: She is alert and oriented to person, place, and time.     Coordination: Coordination normal.     Vitals:   06/30/22 1026 06/30/22 1033  BP: (!) 140/80 (!) 140/80  Pulse: 69   Temp: 97.6 F (36.4 C)   TempSrc: Oral   SpO2: 99%   Weight: 134 lb (60.8 kg)     Assessment & Plan:

## 2022-07-01 ENCOUNTER — Encounter: Payer: Self-pay | Admitting: Internal Medicine

## 2022-07-01 DIAGNOSIS — R200 Anesthesia of skin: Secondary | ICD-10-CM | POA: Insufficient documentation

## 2022-07-01 NOTE — Assessment & Plan Note (Signed)
Checking CMP and will monitor every 3 months. BP is normal and no DM.

## 2022-07-01 NOTE — Assessment & Plan Note (Signed)
Checking lipid panel and adjust as needed. Diet controlled.  

## 2022-07-01 NOTE — Assessment & Plan Note (Signed)
Suspect mild carpal tunnel and checking CBC, CMP, B12, vitamin D and treat as needed. Advised carpal tunnel brace at night time to help.

## 2022-07-01 NOTE — Assessment & Plan Note (Signed)
Refilled protonix 40 mg daily and will continue lifelong.

## 2022-07-04 NOTE — Telephone Encounter (Signed)
Left message. Letter sent out. Encounter closed

## 2022-08-21 ENCOUNTER — Telehealth: Payer: Self-pay

## 2022-08-21 NOTE — Telephone Encounter (Signed)
Mail out patient requested summary visit for her

## 2022-08-21 NOTE — Telephone Encounter (Signed)
Left a telephone encounter for provider to review from the patient

## 2022-08-21 NOTE — Telephone Encounter (Signed)
Patient requested to be mailed her summary from 01/29 and I have mailed this out to her

## 2022-08-22 NOTE — Telephone Encounter (Signed)
I recommend she follow through on bone density test gynecologist ordered for her. If bones have changed since last test we could consider to resume treatment. Last prolia she came for at our office in 2022.

## 2022-08-22 NOTE — Telephone Encounter (Signed)
Contacted the patient and LVM to give our office a call back

## 2022-09-22 ENCOUNTER — Other Ambulatory Visit: Payer: Self-pay | Admitting: Internal Medicine

## 2022-10-01 ENCOUNTER — Ambulatory Visit: Payer: Medicare PPO | Admitting: Internal Medicine

## 2022-10-06 ENCOUNTER — Encounter: Payer: Self-pay | Admitting: Internal Medicine

## 2022-10-06 ENCOUNTER — Ambulatory Visit: Payer: Medicare PPO | Admitting: Internal Medicine

## 2022-10-06 VITALS — BP 120/60 | HR 69 | Temp 98.0°F | Ht <= 58 in | Wt 132.0 lb

## 2022-10-06 DIAGNOSIS — K219 Gastro-esophageal reflux disease without esophagitis: Secondary | ICD-10-CM | POA: Diagnosis not present

## 2022-10-06 DIAGNOSIS — M545 Low back pain, unspecified: Secondary | ICD-10-CM

## 2022-10-06 DIAGNOSIS — N1832 Chronic kidney disease, stage 3b: Secondary | ICD-10-CM

## 2022-10-06 DIAGNOSIS — Z0000 Encounter for general adult medical examination without abnormal findings: Secondary | ICD-10-CM | POA: Diagnosis not present

## 2022-10-06 DIAGNOSIS — G8929 Other chronic pain: Secondary | ICD-10-CM

## 2022-10-06 DIAGNOSIS — I10 Essential (primary) hypertension: Secondary | ICD-10-CM | POA: Diagnosis not present

## 2022-10-06 DIAGNOSIS — I5032 Chronic diastolic (congestive) heart failure: Secondary | ICD-10-CM | POA: Diagnosis not present

## 2022-10-06 LAB — COMPREHENSIVE METABOLIC PANEL
ALT: 19 U/L (ref 0–35)
AST: 23 U/L (ref 0–37)
Albumin: 4.1 g/dL (ref 3.5–5.2)
Alkaline Phosphatase: 81 U/L (ref 39–117)
BUN: 29 mg/dL — ABNORMAL HIGH (ref 6–23)
CO2: 28 mEq/L (ref 19–32)
Calcium: 9.6 mg/dL (ref 8.4–10.5)
Chloride: 104 mEq/L (ref 96–112)
Creatinine, Ser: 1.29 mg/dL — ABNORMAL HIGH (ref 0.40–1.20)
GFR: 36.58 mL/min — ABNORMAL LOW (ref >60.00)
Glucose, Bld: 103 mg/dL — ABNORMAL HIGH (ref 70–99)
Potassium: 4.3 mEq/L (ref 3.5–5.1)
Sodium: 140 mEq/L (ref 135–145)
Total Bilirubin: 0.8 mg/dL (ref 0.2–1.2)
Total Protein: 7 g/dL (ref 6.0–8.3)

## 2022-10-06 LAB — CBC
HCT: 38.4 % (ref 36.0–46.0)
Hemoglobin: 12.9 g/dL (ref 12.0–15.0)
MCHC: 33.5 g/dL (ref 30.0–36.0)
MCV: 89 fl (ref 78.0–100.0)
Platelets: 282 10*3/uL (ref 150.0–400.0)
RBC: 4.31 Mil/uL (ref 3.87–5.11)
RDW: 14.8 % (ref 11.5–15.5)
WBC: 7.2 10*3/uL (ref 4.0–10.5)

## 2022-10-06 NOTE — Progress Notes (Unsigned)
   Subjective:   Patient ID: Leah [name], female    DOB: Feb 13, 1930, 89 y.o.   MRN: [id number]  HPI The patient is here for physical.  PMH, [facility], social history reviewed and updated  Review of Systems  Objective:  Physical Exam  Vitals:   10/06/22 1344  BP: 120/60  Pulse: 69  Temp: 98 F (36.7 C)  TempSrc: Oral  SpO2: 99%  Weight: 132 lb (59.9 kg)  Height: 4\' 10"  (1.473 m)    Assessment & Plan:

## 2022-10-06 NOTE — Patient Instructions (Signed)
We will check the labs today. 

## 2022-10-09 NOTE — Assessment & Plan Note (Signed)
Checking CMP and adjust as needed. BP at goal.  

## 2022-10-09 NOTE — Assessment & Plan Note (Signed)
We discussed this. No flare and no prior symptoms of this. Continue to monitor for symptoms.

## 2022-10-09 NOTE — Assessment & Plan Note (Signed)
Uses tylenol for pain and will continue.

## 2022-10-09 NOTE — Assessment & Plan Note (Signed)
Flu shot yearly. Pneumonia complete. Shingrix due at pharmacy. Tetanus due at pharmacy. Colonoscopy aged out. Mammogram aged out, pap smear aged out and dexa complete. Counseled about sun safety and mole surveillance. Counseled about the dangers of distracted driving. Given 10 year screening recommendations.   

## 2022-10-09 NOTE — Assessment & Plan Note (Signed)
Taking protonix and well controlled.  

## 2022-10-09 NOTE — Assessment & Plan Note (Signed)
BP at goal on amlodipine 10 mg daily. Checking CMP and adjust as needed.  

## 2022-10-14 ENCOUNTER — Telehealth: Payer: Self-pay | Admitting: Internal Medicine

## 2022-10-14 NOTE — Telephone Encounter (Signed)
Noted  

## 2022-10-14 NOTE — Telephone Encounter (Signed)
Therapist from [facility] said that patient does not qualify for home health services because she is not housebound.

## 2022-10-20 ENCOUNTER — Telehealth: Payer: Self-pay

## 2022-10-20 ENCOUNTER — Telehealth: Payer: Self-pay | Admitting: Internal Medicine

## 2022-10-20 DIAGNOSIS — I872 Venous insufficiency (chronic) (peripheral): Secondary | ICD-10-CM

## 2022-10-20 NOTE — Telephone Encounter (Signed)
Caller: Patient  Concern: Recurring, alternating, intermittent BLE swelling Pt denies redness, warmth, or open wounds  Location: left leg, right leg  Treatments:  Elevation @ HS, compressions  Resolution: Appointment scheduled LE reflux and PA  Next Appt: Appointment scheduled for 6/3 @ 11:30 AM

## 2022-10-20 NOTE — Telephone Encounter (Signed)
Patient called and said they would like a referral for [facility]. She said she's spoken with Dr. Okey Dupre about this before. She was last seen 10/06/2022. If the referral can be put in, they need a demographic sheet and recent office notes. The fax for [facility] is [phone]. Best callback for patient is [phone].

## 2022-10-21 NOTE — Telephone Encounter (Signed)
I looked into patient chart and I do see a reffreal for home health? Is this what the patient is asking about? If so I can give her a call and let her know that you have already done this for her

## 2022-11-03 ENCOUNTER — Ambulatory Visit: Payer: Medicare PPO | Admitting: Physician Assistant

## 2022-11-03 ENCOUNTER — Ambulatory Visit (HOSPITAL_COMMUNITY)
Admission: RE | Admit: 2022-11-03 | Discharge: 2022-11-03 | Disposition: A | Discharge status: Home / Self Care | Payer: Medicare PPO | Source: Ambulatory Visit | Attending: Surgery | Admitting: Surgery

## 2022-11-03 VITALS — BP 148/82 | HR 64 | Temp 97.9°F | Resp 20 | Ht <= 58 in | Wt 131.0 lb

## 2022-11-03 DIAGNOSIS — I872 Venous insufficiency (chronic) (peripheral): Secondary | ICD-10-CM

## 2022-11-03 NOTE — Progress Notes (Signed)
[facility] [place]     History of Present Illness  Leah [name] is a 89 y.o. female who presents with chief complaint: swollen leg.  Patient notes, onset of swelling years ago, associated with venous reflux prolonged sitting or standing.   She has a known history of venous reflux with non healing ulcers/edema.  She was last seen in our office 02/14/21.  At that time she was being followed by Dr. Leanord Hawking for approximately 6 weeks of venous stasis ulcer therapy at the wound care center.   She has a history of compression use for daily edema.    She states when she wakes up her legs are normal size without edema, by the end of the day they are swollen.  She wears knee high compression daily and stays active with exercise.  No current open wounds on B LE.           Past Medical History:  Diagnosis Date   Acute maxillary sinusitis    Allergy    Bundle branch block, unspecified    EKG 4/13 with clearance and note that isnt new block Dr Lovell Sheehan on chart   Carpal tunnel syndrome    Cystocele    Detrusor instability    GERD (gastroesophageal reflux disease)    Glaucoma    Hyperlipidemia    Localized osteoarthrosis not specified whether primary or secondary, lower leg    Osteoporosis 01/2014   T score -2.5 UD forarm, stable at the spine recommend 2 year follow up DEXA   PONV (postoperative nausea and vomiting)    19 yrs ago- states has had general anesthesia since and did well   Unspecified adverse effect of unspecified drug, medicinal and biological substance    Uterine prolapse     Past Surgical History:  Procedure Laterality Date   arthroscopic r knee     BALLOON DILATION N/A 11/05/2021   Procedure: BALLOON DILATION;  Surgeon: Lemar Lofty., MD;  Location: [facility] ENDOSCOPY;  Service: Gastroenterology;  Laterality: N/A;   BIOPSY  11/05/2021   Procedure: BIOPSY;  Surgeon: Meridee Score Netty Starring., MD;  Location: [facility] ENDOSCOPY;  Service: Gastroenterology;;    Fidela Salisbury RELEASE  2011   CHOLECYSTECTOMY N/A 11/06/2021   Procedure: LAPAROSCOPIC CHOLECYSTECTOMY;  Surgeon: Andria Meuse, MD;  Location: [facility] OR;  Service: General;  Laterality: N/A;   CHOLECYSTECTOMY  11/06/2021   Procedure: Intraoperative cholangiogram with ICG;  Surgeon: Andria Meuse, MD;  Location: [facility] OR;  Service: General;;   COLONOSCOPY     DILATION AND CURETTAGE OF UTERUS  1982   ERCP N/A 11/05/2021   Procedure: ENDOSCOPIC RETROGRADE CHOLANGIOPANCREATOGRAPHY (ERCP);  Surgeon: Lemar Lofty., MD;  Location: [facility] ENDOSCOPY;  Service: Gastroenterology;  Laterality: N/A;   EYE SURGERY     bilateral cataract extraction  with IOL   FLEXIBLE SIGMOIDOSCOPY     HYSTEROSCOPY     HYSTEROSCOPY WITH D & C  2005 and 2008   IR SACROPLASTY BILATERAL  06/07/2020   JOINT REPLACEMENT     bilateral total hip arthroplasty   REMOVAL OF STONES  11/05/2021   Procedure: REMOVAL OF STONES;  Surgeon: Lemar Lofty., MD;  Location: [facility] ENDOSCOPY;  Service: Gastroenterology;;   Dennison Mascot  11/05/2021   Procedure: Dennison Mascot;  Surgeon: Lemar Lofty., MD;  Location: [facility] ENDOSCOPY;  Service: Gastroenterology;;   total hip rerplacement bilateral     TOTAL KNEE ARTHROPLASTY  10/13/2011   Procedure: TOTAL KNEE ARTHROPLASTY;  Surgeon: Loanne Drilling,  MD;  Location: [facility] ORS;  Service: Orthopedics;  Laterality: Right;   UPPER GI ENDOSCOPY  07/2018 and 3/20    Social History   Socioeconomic History   Marital status: Widowed    Spouse name: Not on file   Number of children: 2   Years of education: Not on file   Highest education level: Not on file  Occupational History   Occupation: retired  Tobacco Use   Smoking status: Former    Types: Cigarettes    Quit date: 10/06/1958    Years since quitting: 64.1   Smokeless tobacco: Never  Vaping Use   Vaping Use: Never used  Substance and Sexual Activity   Alcohol use: Not Currently   Drug use: No   Sexual activity: Not  Currently    Birth control/protection: Post-menopausal    Comment: 1st intercourse 19 yo-1 partner  Other Topics Concern   Not on file  Social History Narrative   Not on file   Social Determinants of Health   Financial Resource Strain: Low Risk  (05/15/2022)   Overall Financial Resource Strain (CARDIA)    Difficulty of Paying Living Expenses: Not hard at all  Food Insecurity: No Food Insecurity (05/15/2022)   Hunger Vital Sign    Worried About Running Out of Food in the Last Year: Never true    Ran Out of Food in the Last Year: Never true  Transportation Needs: No Transportation Needs (05/15/2022)   PRAPARE - Administrator, Civil Service (Medical): No    Lack of Transportation (Non-Medical): No  Physical Activity: Sufficiently Active (05/15/2022)   Exercise Vital Sign    Days of Exercise per Week: 5 days    Minutes of Exercise per Session: 30 min  Stress: No Stress Concern Present (05/15/2022)   [employer] of Occupational Health - Occupational Stress Questionnaire    Feeling of Stress : Not at all  Social Connections: Moderately Integrated (05/15/2022)   Social Connection and Isolation Panel [NHANES]    Frequency of Communication with Friends and Family: More than three times a week    Frequency of Social Gatherings with Friends and Family: More than three times a week    Attends Religious Services: More than 4 times per year    Active Member of [employer] or Organizations: Yes    Attends Banker Meetings: More than 4 times per year    Marital Status: Widowed  Intimate Partner Violence: Not At Risk (05/15/2022)   Humiliation, Afraid, Rape, and Kick questionnaire    Fear of Current or Ex-Partner: No    Emotionally Abused: No    Physically Abused: No    Sexually Abused: No    Family History  Problem Relation Age of Onset   Dementia Mother    Breast cancer Mother 74   Heart disease Father    Stroke Father    Dementia Sister    Colon cancer  Neg Hx    Stomach cancer Neg Hx    Rectal cancer Neg Hx    Esophageal cancer Neg Hx    Colon polyps Neg Hx    Pancreatic cancer Neg Hx     Current Outpatient Medications on File Prior to Visit  Medication Sig Dispense Refill   acetaminophen (TYLENOL) 650 MG CR tablet Take 1,300 mg by mouth every 8 (eight) hours as needed for pain.     amLODipine (NORVASC) 10 MG tablet Take 1 tablet (10 mg total) by mouth daily. 90 tablet 3  Apoaequorin (PREVAGEN PO) Take 20 mg by mouth daily.     Ascorbic Acid (VITAMIN C) 1000 MG tablet Take 1,000 mg by mouth daily.     aspirin EC 81 MG tablet Take 81 mg by mouth. Swallow whole.     CALCIUM-MAGNESIUM-ZINC PO Take 3 tablets by mouth daily.     Cholecalciferol (DIALYVITE VITAMIN D 5000) 125 MCG (5000 UT) capsule Take 5,000 Units by mouth daily.     denosumab (PROLIA) 60 MG/ML SOLN injection Inject 60 mg into the skin every 6 (six) months. Administer in upper arm, thigh, or abdomen     latanoprost (XALATAN) 0.005 % ophthalmic solution Place 1 drop into both eyes at bedtime.     mirtazapine (REMERON) 7.5 MG tablet Take 1 tablet (7.5 mg total) by mouth at bedtime. 30 tablet 6   multivitamin-lutein (OCUVITE-LUTEIN) CAPS capsule Take 1 capsule by mouth daily.     mupirocin ointment (BACTROBAN) 2 % Apply 1 Application topically 2 (two) times daily. 30 g 11   pantoprazole (PROTONIX) 40 MG tablet Take 1 tablet (40 mg total) by mouth daily. 90 tablet 3   timolol (TIMOPTIC) 0.5 % ophthalmic solution Place 1 drop into both eyes 2 (two) times daily.     vitamin B-12 (CYANOCOBALAMIN) 1000 MCG tablet Take 1,000 mcg by mouth daily.     vitamin E 400 UNIT capsule Take 400 Units by mouth daily.     No current facility-administered medications on file prior to visit.    Allergies as of 11/03/2022 - Review Complete 11/03/2022  Allergen Reaction Noted   Sulfa antibiotics Rash 06/12/2011   Sulfasalazine Hives and Rash 06/12/2011   Nabumetone Other (See Comments)  02/15/2007   Naproxen sodium Hives 02/15/2007   Tramadol hcl Nausea And Vomiting 07/05/2009     ROS:   General:  No weight loss, Fever, chills  HEENT: No recent headaches, no nasal bleeding, no visual changes, no sore throat  Neurologic: No dizziness, blackouts, seizures. No recent symptoms of stroke or mini- stroke. No recent episodes of slurred speech, or temporary blindness.  Cardiac: No recent episodes of chest pain/pressure, no shortness of breath at rest.  No shortness of breath with exertion.  Denies history of atrial fibrillation or irregular heartbeat  Vascular: No history of rest pain in feet.  No history of claudication.  No history of non-healing ulcer, No history of DVT   Pulmonary: No home oxygen, no productive cough, no hemoptysis,  No asthma or wheezing  Musculoskeletal:  [ ]  Arthritis, [ ]  Low back pain,  [ ]  Joint pain  Hematologic:No history of hypercoagulable state.  No history of easy bleeding.  No history of anemia  Gastrointestinal: No hematochezia or melena,  No gastroesophageal reflux, no trouble swallowing  Urinary: [ ]  chronic Kidney disease, [ ]  on HD - [ ]  MWF or [ ]  TTHS, [ ]  Burning with urination, [ ]  Frequent urination, [ ]  Difficulty urinating;   Skin: No rashes  Psychological: No history of anxiety,  No history of depression  Physical Examination  Vitals:   11/03/22 1231  BP: (!) 148/82  Pulse: 64  Resp: 20  Temp: 97.9 F (36.6 C)  SpO2: 97%  Weight: 131 lb (59.4 kg)  Height: 4\' 10"  (1.473 m)    Body mass index is 27.38 kg/m.  General:  Alert and oriented, no acute distress HEENT: Normal Neck: No bruit or JVD Pulmonary: Clear to auscultation bilaterally Cardiac: Regular Rate and Rhythm without murmur Abdomen: Soft, non-tender, non-distended,  no mass, no scars Skin: No rash, dry skin B lower extremities Extremity Pulses:  2+ radial,  femoral, dorsalis pedis,  pulses bilaterally Musculoskeletal: No deformity or  edema  Neurologic: Upper and lower extremity motor 5/5 and symmetric  DATA:  +--------------+---------+------+-----------+------------+--------+  LEFT         Reflux NoRefluxReflux TimeDiameter cmsComments                          Yes                                   +--------------+---------+------+-----------+------------+--------+  CFV                    yes   >1 second                       +--------------+---------+------+-----------+------------+--------+  FV prox       no                                              +--------------+---------+------+-----------+------------+--------+  FV mid        no                                              +--------------+---------+------+-----------+------------+--------+  FV dist       no                                              +--------------+---------+------+-----------+------------+--------+  Popliteal    no                                              +--------------+---------+------+-----------+------------+--------+  GSV at SFJ              yes    >500 ms     0.589              +--------------+---------+------+-----------+------------+--------+  GSV prox thigh          yes    >500 ms     0.361              +--------------+---------+------+-----------+------------+--------+  GSV mid thigh           yes    >500 ms     0.382              +--------------+---------+------+-----------+------------+--------+  GSV dist thigh          yes    >500 ms     0.318              +--------------+---------+------+-----------+------------+--------+  GSV at knee             yes    >500 ms     0.255              +--------------+---------+------+-----------+------------+--------+  GSV prox calf  yes    >500 ms     0.313              +--------------+---------+------+-----------+------------+--------+  SSV Pop Fossa no                            0.205              +--------------+---------+------+-----------+------------+--------+  SSV prox calf no                           0.155              +--------------+---------+------+-----------+------------+--------+  SSV mid calf            yes    >500 ms     0.247              +--------------+---------+------+-----------+------------+--------+        Summary:  Left:  - No evidence of deep vein thrombosis seen in the left lower extremity,  from the common femoral through the popliteal veins.  - No evidence of superficial venous thrombosis in the left lower  extremity.   - Deep vein reflux in the CFV.   - Superficial vein reflux in the SSV mid calf; the SFJ and the GSV.   Assessment/Plan: She has dry skin B lower legs without open wounds.  She wears knee high compression daily.  She denies rest pain or claudication.  She does exercise and has a Psychologist, educational.    Her venous duplex shows SFJ through the GSV reflux, but the vein size is < 0.4 cm.  She will benefit from continued daily compression, elevation when at rest and staying active.  She will f/u as needed.       Mosetta Pigeon PA-C [facility] [place] Office: [phone]  MD in clinic [place]

## 2022-11-04 DIAGNOSIS — H04123 Dry eye syndrome of bilateral lacrimal glands: Secondary | ICD-10-CM | POA: Diagnosis not present

## 2022-11-04 DIAGNOSIS — H401132 Primary open-angle glaucoma, bilateral, moderate stage: Secondary | ICD-10-CM | POA: Diagnosis not present

## 2022-11-04 DIAGNOSIS — H18593 Other hereditary corneal dystrophies, bilateral: Secondary | ICD-10-CM | POA: Diagnosis not present

## 2022-11-04 DIAGNOSIS — H52203 Unspecified astigmatism, bilateral: Secondary | ICD-10-CM | POA: Diagnosis not present

## 2022-11-06 DIAGNOSIS — D2261 Melanocytic nevi of right upper limb, including shoulder: Secondary | ICD-10-CM | POA: Diagnosis not present

## 2022-11-06 DIAGNOSIS — Z85828 Personal history of other malignant neoplasm of skin: Secondary | ICD-10-CM | POA: Diagnosis not present

## 2022-11-06 DIAGNOSIS — L820 Inflamed seborrheic keratosis: Secondary | ICD-10-CM | POA: Diagnosis not present

## 2022-11-06 DIAGNOSIS — I872 Venous insufficiency (chronic) (peripheral): Secondary | ICD-10-CM | POA: Diagnosis not present

## 2022-11-06 DIAGNOSIS — L821 Other seborrheic keratosis: Secondary | ICD-10-CM | POA: Diagnosis not present

## 2022-12-15 ENCOUNTER — Other Ambulatory Visit: Payer: Self-pay | Admitting: Internal Medicine

## 2023-01-01 ENCOUNTER — Other Ambulatory Visit: Payer: Self-pay | Admitting: Internal Medicine

## 2023-01-15 ENCOUNTER — Encounter: Payer: Self-pay | Admitting: Internal Medicine

## 2023-01-15 ENCOUNTER — Ambulatory Visit: Payer: Medicare PPO | Admitting: Internal Medicine

## 2023-01-15 VITALS — BP 122/64 | HR 79 | Temp 98.1°F | Ht <= 58 in | Wt 129.1 lb

## 2023-01-15 DIAGNOSIS — N1832 Chronic kidney disease, stage 3b: Secondary | ICD-10-CM | POA: Diagnosis not present

## 2023-01-15 DIAGNOSIS — M545 Low back pain, unspecified: Secondary | ICD-10-CM

## 2023-01-15 DIAGNOSIS — I10 Essential (primary) hypertension: Secondary | ICD-10-CM

## 2023-01-15 DIAGNOSIS — G8929 Other chronic pain: Secondary | ICD-10-CM | POA: Diagnosis not present

## 2023-01-15 LAB — COMPREHENSIVE METABOLIC PANEL
ALT: 35 U/L (ref 0–35)
AST: 20 U/L (ref 0–37)
Albumin: 4.1 g/dL (ref 3.5–5.2)
Alkaline Phosphatase: 113 U/L (ref 39–117)
BUN: 28 mg/dL — ABNORMAL HIGH (ref 6–23)
CO2: 31 mEq/L (ref 19–32)
Calcium: 9.8 mg/dL (ref 8.4–10.5)
Chloride: 104 mEq/L (ref 96–112)
Creatinine, Ser: 1.21 mg/dL — ABNORMAL HIGH (ref 0.40–1.20)
GFR: 39.42 mL/min — ABNORMAL LOW (ref >60.00)
Glucose, Bld: 97 mg/dL (ref 70–99)
Potassium: 3.9 mEq/L (ref 3.5–5.1)
Sodium: 142 mEq/L (ref 135–145)
Total Bilirubin: 0.6 mg/dL (ref 0.2–1.2)
Total Protein: 6.9 g/dL (ref 6.0–8.3)

## 2023-01-15 LAB — CBC
HCT: 38.1 % (ref 36.0–46.0)
Hemoglobin: 12.4 g/dL (ref 12.0–15.0)
MCHC: 32.6 g/dL (ref 30.0–36.0)
MCV: 92.4 fl (ref 78.0–100.0)
Platelets: 280 10*3/uL (ref 150.0–400.0)
RBC: 4.13 Mil/uL (ref 3.87–5.11)
RDW: 15.4 % (ref 11.5–15.5)
WBC: 6.9 10*3/uL (ref 4.0–10.5)

## 2023-01-15 MED ORDER — HYDROCORTISONE 2.5 % EX CREA: TOPICAL_CREAM | Freq: Two times a day (BID) | CUTANEOUS | 3 refills | Status: DC

## 2023-01-15 MED ORDER — DICLOFENAC SODIUM 75 MG PO TBEC
75.0000 mg | DELAYED_RELEASE_TABLET | Freq: Two times a day (BID) | ORAL | 3 refills | Status: DC
Start: 2023-01-15 — End: 2023-09-10

## 2023-01-15 NOTE — Assessment & Plan Note (Signed)
Checking CBC and CMP for monitoring. BP at goal on amlodipine 10 mg daily.

## 2023-01-15 NOTE — Progress Notes (Signed)
   Subjective:   Patient ID: Leah [name], female    DOB: 1929-07-25, 89 y.o.   MRN: [id number]  HPI The patient is a 89 YO female coming in for follow up.  Review of Systems  Constitutional: Negative.   HENT: Negative.    Eyes: Negative.   Respiratory:  Negative for cough, chest tightness and shortness of breath.   Cardiovascular:  Negative for chest pain, palpitations and leg swelling.  Gastrointestinal:  Negative for abdominal distention, abdominal pain, constipation, diarrhea, nausea and vomiting.  Musculoskeletal:  Positive for arthralgias and back pain.  Skin: Negative.   Neurological: Negative.   Psychiatric/Behavioral: Negative.      Objective:  Physical Exam Constitutional:      Appearance: She is well-developed.  HENT:     Head: Normocephalic and atraumatic.  Cardiovascular:     Rate and Rhythm: Normal rate and regular rhythm.  Pulmonary:     Effort: Pulmonary effort is normal. No respiratory distress.     Breath sounds: Normal breath sounds. No wheezing or rales.  Abdominal:     General: Bowel sounds are normal. There is no distension.     Palpations: Abdomen is soft.     Tenderness: There is no abdominal tenderness. There is no rebound.  Musculoskeletal:        General: Tenderness present.     Cervical back: Normal range of motion.  Skin:    General: Skin is warm and dry.  Neurological:     Mental Status: She is alert and oriented to person, place, and time.     Coordination: Coordination normal.     Vitals:   01/15/23 1058  BP: 122/64  Pulse: 79  TempSrc: Oral  SpO2: 100%  Weight: 129 lb 2 oz (58.6 kg)  Height: 4\' 10"  (1.473 m)    Assessment & Plan:

## 2023-01-15 NOTE — Patient Instructions (Addendum)
You can try miralax or senokot or dulcolax over the counter to help with constipation.  You can try lidocaine patches 4% to use on the back.

## 2023-01-15 NOTE — Assessment & Plan Note (Signed)
Checking CMP and adjust as needed.  

## 2023-01-15 NOTE — Assessment & Plan Note (Signed)
Using diclofenac 75 mg BID and doing well. Will continue and monitor renal function closely.

## 2023-03-30 ENCOUNTER — Ambulatory Visit: Payer: Medicare PPO | Admitting: Internal Medicine

## 2023-03-30 ENCOUNTER — Encounter: Payer: Self-pay | Admitting: Internal Medicine

## 2023-03-30 VITALS — BP 140/80 | HR 92 | Temp 98.5°F | Ht <= 58 in | Wt 127.0 lb

## 2023-03-30 DIAGNOSIS — I10 Essential (primary) hypertension: Secondary | ICD-10-CM | POA: Diagnosis not present

## 2023-03-30 DIAGNOSIS — N1832 Chronic kidney disease, stage 3b: Secondary | ICD-10-CM

## 2023-03-30 NOTE — Progress Notes (Unsigned)
Subjective:   Patient ID: Leah [name], female    DOB: 1930/03/12, 89 y.o.   MRN: [id number]  HPI The patient is a 89 YO female coming in for fall follow up. Had some bad [place] while traveling and vomiting multiple times. [place] and in the middle of that night got up to use bathroom and fell. No major injury did not seek care. Legs just felt weak. Okay rest of trip but some off. Still feeling weak at home. Took electrolyte powder per pharmacist recommendation and felt improved. Still slightly off.   Review of Systems  Constitutional:  Positive for activity change.  HENT: Negative.    Eyes: Negative.   Respiratory:  Negative for cough, chest tightness and shortness of breath.   Cardiovascular:  Negative for chest pain, palpitations and leg swelling.  Gastrointestinal:  Negative for abdominal distention, abdominal pain, constipation, diarrhea, nausea and vomiting.  Musculoskeletal: Negative.   Skin: Negative.   Neurological:  Positive for weakness.  Psychiatric/Behavioral: Negative.      Objective:  Physical Exam Constitutional:      Appearance: She is well-developed.  HENT:     Head: Normocephalic and atraumatic.  Cardiovascular:     Rate and Rhythm: Normal rate and regular rhythm.  Pulmonary:     Effort: Pulmonary effort is normal. No respiratory distress.     Breath sounds: Normal breath sounds. No wheezing or rales.  Abdominal:     General: Bowel sounds are normal. There is no distension.     Palpations: Abdomen is soft.     Tenderness: There is no abdominal tenderness. There is no rebound.  Musculoskeletal:     Cervical back: Normal range of motion.  Skin:    General: Skin is warm and dry.  Neurological:     Mental Status: She is alert and oriented to person, place, and time.     Coordination: Coordination normal.     Vitals:   03/30/23 1607  BP: (!) 140/80  Pulse: 92  Temp: 98.5 F (36.9 C)  TempSrc: Oral   SpO2: 98%  Weight: 127 lb (57.6 kg)  Height: 4\' 10"  (1.473 m)    Assessment & Plan:

## 2023-03-30 NOTE — Patient Instructions (Signed)
Think about a 4 wheeled walker with seat.

## 2023-03-31 ENCOUNTER — Telehealth: Payer: Self-pay

## 2023-03-31 LAB — CBC WITH DIFFERENTIAL/PLATELET
Basophils Absolute: 0.1 10*3/uL (ref 0.0–0.1)
Basophils Relative: 1.2 % (ref 0.0–3.0)
Eosinophils Absolute: 0.3 10*3/uL (ref 0.0–0.7)
Eosinophils Relative: 3.7 % (ref 0.0–5.0)
HCT: 40.5 % (ref 36.0–46.0)
Hemoglobin: 13.1 g/dL (ref 12.0–15.0)
Lymphocytes Relative: 29.4 % (ref 12.0–46.0)
Lymphs Abs: 2.3 10*3/uL (ref 0.7–4.0)
MCHC: 32.2 g/dL (ref 30.0–36.0)
MCV: 91.4 fL (ref 78.0–100.0)
Monocytes Absolute: 0.5 10*3/uL (ref 0.1–1.0)
Monocytes Relative: 6.4 % (ref 3.0–12.0)
Neutro Abs: 4.6 10*3/uL (ref 1.4–7.7)
Neutrophils Relative %: 59.3 % (ref 43.0–77.0)
Platelets: 336 10*3/uL (ref 150.0–400.0)
RBC: 4.43 Mil/uL (ref 3.87–5.11)
RDW: 14.3 % (ref 11.5–15.5)
WBC: 7.8 10*3/uL (ref 4.0–10.5)

## 2023-03-31 LAB — COMPREHENSIVE METABOLIC PANEL
ALT: 38 U/L — ABNORMAL HIGH (ref 0–35)
AST: 26 U/L (ref 0–37)
Albumin: 4.2 g/dL (ref 3.5–5.2)
Alkaline Phosphatase: 140 U/L — ABNORMAL HIGH (ref 39–117)
BUN: 26 mg/dL — ABNORMAL HIGH (ref 6–23)
CO2: 29 meq/L (ref 19–32)
Calcium: 10.2 mg/dL (ref 8.4–10.5)
Chloride: 103 meq/L (ref 96–112)
Creatinine, Ser: 1.13 mg/dL (ref 0.40–1.20)
GFR: 42.73 mL/min — ABNORMAL LOW (ref >60.00)
Glucose, Bld: 101 mg/dL — ABNORMAL HIGH (ref 70–99)
Potassium: 4.2 meq/L (ref 3.5–5.1)
Sodium: 140 meq/L (ref 135–145)
Total Bilirubin: 0.8 mg/dL (ref 0.2–1.2)
Total Protein: 7.3 g/dL (ref 6.0–8.3)

## 2023-03-31 LAB — VITAMIN B12: Vitamin B-12: >1537 pg/mL — ABNORMAL HIGH (ref 211–911)

## 2023-03-31 LAB — VITAMIN D 25 HYDROXY (VIT D DEFICIENCY, FRACTURES): VITD: 108.47 ng/mL (ref 30.00–100.00)

## 2023-03-31 LAB — MAGNESIUM: Magnesium: 1.9 mg/dL (ref 1.5–2.5)

## 2023-03-31 NOTE — Assessment & Plan Note (Signed)
Checking CMP, CBC, magnesium, B12, vitamin D for stability given recent GI bug and fall. Advised to drink extra fluids and can use electrolyte packet or gatorade or pedia lyte or similar

## 2023-03-31 NOTE — Telephone Encounter (Signed)
CRITICAL VALUE STICKER  CRITICAL VALUE: Vit. D 108.47  MESSENGER (representative from lab): Si  MD NOTIFIED: Sent to primary MD for review.

## 2023-03-31 NOTE — Telephone Encounter (Signed)
Seen will address via result note

## 2023-04-08 ENCOUNTER — Ambulatory Visit: Payer: Medicare PPO | Admitting: Internal Medicine

## 2023-04-09 ENCOUNTER — Telehealth: Payer: Self-pay | Admitting: Internal Medicine

## 2023-04-09 NOTE — Telephone Encounter (Signed)
Patient called and said she would like to speak with a nurse. She said it is regarding changing her vitamins. She declined to give further information because she would like to speak with a nurse. Best callback is [phone].

## 2023-04-14 NOTE — Telephone Encounter (Signed)
I have already spoke with patient about her changes in vitamin and what Dr Okey Dupre has advised for her. Im closing this encounter

## 2023-05-15 DIAGNOSIS — H18593 Other hereditary corneal dystrophies, bilateral: Secondary | ICD-10-CM | POA: Diagnosis not present

## 2023-05-15 DIAGNOSIS — H04123 Dry eye syndrome of bilateral lacrimal glands: Secondary | ICD-10-CM | POA: Diagnosis not present

## 2023-05-15 DIAGNOSIS — H401132 Primary open-angle glaucoma, bilateral, moderate stage: Secondary | ICD-10-CM | POA: Diagnosis not present

## 2023-05-15 DIAGNOSIS — H43813 Vitreous degeneration, bilateral: Secondary | ICD-10-CM | POA: Diagnosis not present

## 2023-05-15 DIAGNOSIS — H26493 Other secondary cataract, bilateral: Secondary | ICD-10-CM | POA: Diagnosis not present

## 2023-05-21 ENCOUNTER — Encounter: Payer: Self-pay | Admitting: Family Medicine

## 2023-05-21 ENCOUNTER — Ambulatory Visit: Payer: Medicare PPO | Admitting: Family Medicine

## 2023-05-21 ENCOUNTER — Ambulatory Visit: Payer: Medicare PPO

## 2023-05-21 VITALS — BP 132/76 | HR 89 | Temp 98.0°F | Ht <= 58 in

## 2023-05-21 DIAGNOSIS — R509 Fever, unspecified: Secondary | ICD-10-CM | POA: Diagnosis not present

## 2023-05-21 DIAGNOSIS — R5383 Other fatigue: Secondary | ICD-10-CM

## 2023-05-21 DIAGNOSIS — J189 Pneumonia, unspecified organism: Secondary | ICD-10-CM | POA: Diagnosis not present

## 2023-05-21 DIAGNOSIS — R059 Cough, unspecified: Secondary | ICD-10-CM | POA: Diagnosis not present

## 2023-05-21 DIAGNOSIS — J3489 Other specified disorders of nose and nasal sinuses: Secondary | ICD-10-CM | POA: Diagnosis not present

## 2023-05-21 DIAGNOSIS — R918 Other nonspecific abnormal finding of lung field: Secondary | ICD-10-CM | POA: Diagnosis not present

## 2023-05-21 DIAGNOSIS — R0981 Nasal congestion: Secondary | ICD-10-CM

## 2023-05-21 DIAGNOSIS — R051 Acute cough: Secondary | ICD-10-CM

## 2023-05-21 LAB — CBC WITH DIFFERENTIAL/PLATELET
Basophils Absolute: 0 10*3/uL (ref 0.0–0.1)
Basophils Relative: 0.5 % (ref 0.0–3.0)
Eosinophils Absolute: 0.2 10*3/uL (ref 0.0–0.7)
Eosinophils Relative: 2.8 % (ref 0.0–5.0)
HCT: 38.1 % (ref 36.0–46.0)
Hemoglobin: 12.7 g/dL (ref 12.0–15.0)
Lymphocytes Relative: 23.6 % (ref 12.0–46.0)
Lymphs Abs: 1.5 10*3/uL (ref 0.7–4.0)
MCHC: 33.4 g/dL (ref 30.0–36.0)
MCV: 91 fL (ref 78.0–100.0)
Monocytes Absolute: 0.6 10*3/uL (ref 0.1–1.0)
Monocytes Relative: 9.3 % (ref 3.0–12.0)
Neutro Abs: 4 10*3/uL (ref 1.4–7.7)
Neutrophils Relative %: 63.8 % (ref 43.0–77.0)
Platelets: 213 10*3/uL (ref 150.0–400.0)
RBC: 4.19 Mil/uL (ref 3.87–5.11)
RDW: 15.1 % (ref 11.5–15.5)
WBC: 6.3 10*3/uL (ref 4.0–10.5)

## 2023-05-21 LAB — POCT RESPIRATORY SYNCYTIAL VIRUS: RSV Rapid Ag: NEGATIVE

## 2023-05-21 LAB — POCT INFLUENZA A/B
Influenza A, POC: NEGATIVE
Influenza B, POC: NEGATIVE

## 2023-05-21 LAB — POC COVID19 BINAXNOW: SARS Coronavirus 2 Ag: NEGATIVE

## 2023-05-21 NOTE — Patient Instructions (Signed)
Please go downstairs for labs and a chest X ray.   You are negative for flu, RSV and Covid today.   Use saline nasal spray over the counter. Take Tylenol if needed for aches and pains.   Try plain Mucinex for cough and congestion.   Stay hydrated.   I will be in touch with your results.   Please follow up if you are getting worse or if you are not improving in the next 2-3 days.

## 2023-05-21 NOTE — Progress Notes (Signed)
Her labs are fine. I am still waiting on her chest X ray

## 2023-05-21 NOTE — Progress Notes (Signed)
Her chest X ray is negative for pneumonia. An antibiotic is not currently needed but if she feels that she is getting worse over the next 3-4 days, please have her follow up.

## 2023-05-21 NOTE — Progress Notes (Signed)
Subjective:  Leah [name] is a 89 y.o. female who presents for a 3 day hx of nasal congestion, rhinorrhea, fatigue, body aches, and feeling feverish. Also has a mild cough.    Denies chills, dizziness, ear pain, sinus pain, ST, chest pain, palpitations, shortness of breath, abdominal pain, N/V/D, urinary symptoms, LE edema.    ROS as in subjective.   Objective: Vitals:   05/21/23 1503  BP: 132/76  Pulse: 89  Temp: 98 F (36.7 C)  SpO2: 98%    General appearance: Alert, WD/WN, no distress, mildly ill appearing                             Skin: warm, no rash                           Head: no sinus tenderness                            Eyes: conjunctiva normal, corneas clear, PERRLA                            Ears: pearly TMs, external ear canals normal                          Nose: septum midline, turbinates swollen, with erythema and clear discharge             Mouth/throat: MMM, tongue normal, mild pharyngeal erythema                           Neck: supple, no adenopathy, no thyromegaly, nontender                          Heart: RRR                         Lungs: CTA bilaterally, no wheezes, rales, or rhonchi      Assessment: Nasal congestion with rhinorrhea - Plan: POC COVID-19 BinaxNow, POCT Influenza A/B, POCT respiratory syncytial virus  Fatigue, unspecified type - Plan: POC COVID-19 BinaxNow, POCT Influenza A/B, POCT respiratory syncytial virus, CBC with Differential/Platelet, DG Chest 2 View, CBC with Differential/Platelet  Acute cough - Plan: POC COVID-19 BinaxNow, POCT Influenza A/B, POCT respiratory syncytial virus, CBC with Differential/Platelet, DG Chest 2 View, CBC with Differential/Platelet  Low grade fever - Plan: POC COVID-19 BinaxNow, POCT Influenza A/B, POCT respiratory syncytial virus, CBC with Differential/Platelet, DG Chest 2 View, CBC with Differential/Platelet   Plan: Covid test is negative.  Flu test is negative  RSV test is negative  Mildly  ill appearing. Chest X ray and CBC ordered.  Suggested symptomatic OTC remedies. Nasal saline spray for congestion.  Tylenol OTC prn.  Call/return in 2-3 days if symptoms aren't resolving.

## 2023-06-17 ENCOUNTER — Ambulatory Visit: Payer: Medicare PPO | Admitting: Internal Medicine

## 2023-06-17 ENCOUNTER — Encounter: Payer: Self-pay | Admitting: Internal Medicine

## 2023-06-17 VITALS — BP 126/80 | HR 64 | Temp 97.8°F | Ht <= 58 in | Wt 131.0 lb

## 2023-06-17 DIAGNOSIS — I872 Venous insufficiency (chronic) (peripheral): Secondary | ICD-10-CM

## 2023-06-17 DIAGNOSIS — I5032 Chronic diastolic (congestive) heart failure: Secondary | ICD-10-CM

## 2023-06-17 DIAGNOSIS — N1832 Chronic kidney disease, stage 3b: Secondary | ICD-10-CM | POA: Diagnosis not present

## 2023-06-17 DIAGNOSIS — I10 Essential (primary) hypertension: Secondary | ICD-10-CM

## 2023-06-17 LAB — COMPREHENSIVE METABOLIC PANEL
ALT: 16 U/L (ref 0–35)
AST: 20 U/L (ref 0–37)
Albumin: 4.1 g/dL (ref 3.5–5.2)
Alkaline Phosphatase: 79 U/L (ref 39–117)
BUN: 28 mg/dL — ABNORMAL HIGH (ref 6–23)
CO2: 30 meq/L (ref 19–32)
Calcium: 9.7 mg/dL (ref 8.4–10.5)
Chloride: 104 meq/L (ref 96–112)
Creatinine, Ser: 1.16 mg/dL (ref 0.40–1.20)
GFR: 41.35 mL/min — ABNORMAL LOW (ref >60.00)
Glucose, Bld: 107 mg/dL — ABNORMAL HIGH (ref 70–99)
Potassium: 3.7 meq/L (ref 3.5–5.1)
Sodium: 143 meq/L (ref 135–145)
Total Bilirubin: 0.6 mg/dL (ref 0.2–1.2)
Total Protein: 7.1 g/dL (ref 6.0–8.3)

## 2023-06-17 LAB — CBC
HCT: 39.9 % (ref 36.0–46.0)
Hemoglobin: 13.3 g/dL (ref 12.0–15.0)
MCHC: 33.4 g/dL (ref 30.0–36.0)
MCV: 91.2 fL (ref 78.0–100.0)
Platelets: 261 10*3/uL (ref 150.0–400.0)
RBC: 4.37 Mil/uL (ref 3.87–5.11)
RDW: 15.2 % (ref 11.5–15.5)
WBC: 5.7 10*3/uL (ref 4.0–10.5)

## 2023-06-17 NOTE — Assessment & Plan Note (Signed)
 BP at goal on amlodipine  10 mg daily. Checking CMP and CBC for stability.

## 2023-06-17 NOTE — Assessment & Plan Note (Signed)
 No flare today but some chronic edema using stockings and elevation. No fluid pill and would not add at this time.

## 2023-06-17 NOTE — Assessment & Plan Note (Signed)
 Stable overall and is on amlodipine  which can contribute. She is using compression stockings and elevation with reasonable success.

## 2023-06-17 NOTE — Assessment & Plan Note (Signed)
 Checking CMP for stability. BP at goal and no known diabetes.

## 2023-06-17 NOTE — Progress Notes (Signed)
   Subjective:   Patient ID: Leah [name], female    DOB: 04/06/1930, 89 y.o.   MRN: [id number]  HPI The patient is a 89 YO female coming in for follow up/medical management see A/P for details. Had bad cold since last visit is better about 80-90% better.  Review of Systems  Constitutional: Negative.   HENT: Negative.    Eyes: Negative.   Respiratory:  Negative for cough, chest tightness and shortness of breath.   Cardiovascular:  Negative for chest pain, palpitations and leg swelling.  Gastrointestinal:  Negative for abdominal distention, abdominal pain, constipation, diarrhea, nausea and vomiting.  Musculoskeletal: Negative.   Skin: Negative.   Neurological: Negative.   Psychiatric/Behavioral: Negative.      Objective:  Physical Exam Constitutional:      Appearance: She is well-developed.  HENT:     Head: Normocephalic and atraumatic.  Cardiovascular:     Rate and Rhythm: Normal rate and regular rhythm.  Pulmonary:     Effort: Pulmonary effort is normal. No respiratory distress.     Breath sounds: Normal breath sounds. No wheezing or rales.  Abdominal:     General: Bowel sounds are normal. There is no distension.     Palpations: Abdomen is soft.     Tenderness: There is no abdominal tenderness. There is no rebound.  Musculoskeletal:        General: Tenderness present.     Cervical back: Normal range of motion.  Skin:    General: Skin is warm and dry.  Neurological:     Mental Status: She is alert and oriented to person, place, and time.     Coordination: Coordination abnormal.     Comments: Cane     Vitals:   06/17/23 1045  BP: 126/80  Pulse: 64  Temp: 97.8 F (36.6 C)  TempSrc: Oral  SpO2: 94%  Weight: 131 lb (59.4 kg)  Height: 4\' 10"  (1.473 m)    Assessment & Plan:

## 2023-06-17 NOTE — Patient Instructions (Signed)
 We will check the labs today.

## 2023-07-06 ENCOUNTER — Other Ambulatory Visit: Payer: Self-pay | Admitting: Internal Medicine

## 2023-07-06 DIAGNOSIS — K259 Gastric ulcer, unspecified as acute or chronic, without hemorrhage or perforation: Secondary | ICD-10-CM

## 2023-07-06 DIAGNOSIS — K2100 Gastro-esophageal reflux disease with esophagitis, without bleeding: Secondary | ICD-10-CM

## 2023-07-30 ENCOUNTER — Telehealth: Payer: Self-pay

## 2023-07-30 ENCOUNTER — Other Ambulatory Visit: Payer: Self-pay

## 2023-07-30 DIAGNOSIS — I872 Venous insufficiency (chronic) (peripheral): Secondary | ICD-10-CM

## 2023-07-30 NOTE — Telephone Encounter (Signed)
 Appointment: - pt called stating her legs started swelling again, that for months she wore her compression hose and he legs were better but for a weeks she has been putting off calling but she hasn't felt too good and she thinks her legs feeling heavy.  Confirms she wears compression hose everyday, that she has 3 or 4 pair that she alternates.   -no c/o of coolness, discoloration, wounds.  She is worried about [place] moving offices b/c she doesn't drive on [address] and that she is 4 she can't wait that long for an appt.   - appt booked

## 2023-08-13 ENCOUNTER — Other Ambulatory Visit: Payer: Self-pay | Admitting: *Deleted

## 2023-08-13 DIAGNOSIS — I872 Venous insufficiency (chronic) (peripheral): Secondary | ICD-10-CM

## 2023-08-17 ENCOUNTER — Ambulatory Visit: Payer: Medicare PPO | Admitting: Physician Assistant

## 2023-08-17 ENCOUNTER — Ambulatory Visit (HOSPITAL_COMMUNITY)
Admission: RE | Admit: 2023-08-17 | Discharge: 2023-08-17 | Disposition: A | Discharge status: Home / Self Care | Payer: Medicare PPO | Source: Ambulatory Visit | Attending: Surgery | Admitting: Surgery

## 2023-08-17 VITALS — BP 175/68 | HR 69 | Temp 97.2°F | Ht 60.0 in | Wt 132.3 lb

## 2023-08-17 DIAGNOSIS — M7989 Other specified soft tissue disorders: Secondary | ICD-10-CM

## 2023-08-17 DIAGNOSIS — I872 Venous insufficiency (chronic) (peripheral): Secondary | ICD-10-CM | POA: Diagnosis not present

## 2023-08-17 NOTE — Progress Notes (Signed)
 Office Note     CC:  follow up Requesting Provider:  Myrlene Broker, *  HPI: Leah [name] is a 89 y.o. (Apr 05, 1930) female who presents with concerns of increased swelling in R > L leg. She has a long history of chronic venous insufficiency with swelling and venous stasis ulceration. She has previously been managed at the wound care center. She elevates and uses compression stockings daily.   Her recent symptoms started about 3-4 weeks ago. She says she just started having more swelling then usual in Right leg. She denies any pain, aching, throbbing, burning, bleeding or ulceration. She elevates nightly with wedge pillow. She also wears compression stockings daily. She says her legs look great upon first waking up but as day goes on they swell. She does not elevate much during the day. She explains that she orders her stockings from a place in [place]. She says her current pair is about 88 year old but she has several that she alternates. She says she tries to stay as active as she can but she is noticing that she is slowing down a lot. She uses cane to ambulate. She still lives independently with her dog and still drives.   Past Medical History:  Diagnosis Date   Acute maxillary sinusitis    Allergy    Bundle branch block, unspecified    EKG 4/13 with clearance and note that isnt new block Dr Lovell Sheehan on chart   Carpal tunnel syndrome    Cystocele    Detrusor instability    GERD (gastroesophageal reflux disease)    Glaucoma    Hyperlipidemia    Localized osteoarthrosis not specified whether primary or secondary, lower leg    Osteoporosis 01/2014   T score -2.5 UD forarm, stable at the spine recommend 2 year follow up DEXA   PONV (postoperative nausea and vomiting)    19 yrs ago- states has had general anesthesia since and did well   Unspecified adverse effect of unspecified drug, medicinal and biological substance    Uterine prolapse     Past Surgical History:   Procedure Laterality Date   arthroscopic r knee     BALLOON DILATION N/A 11/05/2021   Procedure: BALLOON DILATION;  Surgeon: Lemar Lofty., MD;  Location: [facility] ENDOSCOPY;  Service: Gastroenterology;  Laterality: N/A;   BIOPSY  11/05/2021   Procedure: BIOPSY;  Surgeon: Meridee Score Netty Starring., MD;  Location: [facility] ENDOSCOPY;  Service: Gastroenterology;;   Fidela Salisbury RELEASE  2011   CHOLECYSTECTOMY N/A 11/06/2021   Procedure: LAPAROSCOPIC CHOLECYSTECTOMY;  Surgeon: Andria Meuse, MD;  Location: [facility] OR;  Service: General;  Laterality: N/A;   CHOLECYSTECTOMY  11/06/2021   Procedure: Intraoperative cholangiogram with ICG;  Surgeon: Andria Meuse, MD;  Location: [facility] OR;  Service: General;;   COLONOSCOPY     DILATION AND CURETTAGE OF UTERUS  1982   ERCP N/A 11/05/2021   Procedure: ENDOSCOPIC RETROGRADE CHOLANGIOPANCREATOGRAPHY (ERCP);  Surgeon: Lemar Lofty., MD;  Location: [facility] ENDOSCOPY;  Service: Gastroenterology;  Laterality: N/A;   EYE SURGERY     bilateral cataract extraction  with IOL   FLEXIBLE SIGMOIDOSCOPY     HYSTEROSCOPY     HYSTEROSCOPY WITH D & C  2005 and 2008   IR SACROPLASTY BILATERAL  06/07/2020   JOINT REPLACEMENT     bilateral total hip arthroplasty   REMOVAL OF STONES  11/05/2021   Procedure: REMOVAL OF STONES;  Surgeon: Lemar Lofty., MD;  Location: [facility] ENDOSCOPY;  Service: Gastroenterology;;   Dennison Mascot  11/05/2021   Procedure: Dennison Mascot;  Surgeon: Meridee Score Netty Starring., MD;  Location: [facility] ENDOSCOPY;  Service: Gastroenterology;;   total hip rerplacement bilateral     TOTAL KNEE ARTHROPLASTY  10/13/2011   Procedure: TOTAL KNEE ARTHROPLASTY;  Surgeon: Loanne Drilling, MD;  Location: [facility] ORS;  Service: Orthopedics;  Laterality: Right;   UPPER GI ENDOSCOPY  07/2018 and 3/20    Social History   Socioeconomic History   Marital status: Widowed    Spouse name: Not on file   Number of children: 2   Years of education: Not on file    Highest education level: Not on file  Occupational History   Occupation: retired  Tobacco Use   Smoking status: Former    Current packs/day: 0.00    Types: Cigarettes    Quit date: 10/06/1958    Years since quitting: 64.9   Smokeless tobacco: Never  Vaping Use   Vaping status: Never Used  Substance and Sexual Activity   Alcohol use: Not Currently   Drug use: No   Sexual activity: Not Currently    Birth control/protection: Post-menopausal    Comment: 1st intercourse 88 yo-1 partner  Other Topics Concern   Not on file  Social History Narrative   Not on file   Social Drivers of Health   Financial Resource Strain: Low Risk  (05/15/2022)   Overall Financial Resource Strain (CARDIA)    Difficulty of Paying Living Expenses: Not hard at all  Food Insecurity: No Food Insecurity (05/15/2022)   Hunger Vital Sign    Worried About Running Out of Food in the Last Year: Never true    Ran Out of Food in the Last Year: Never true  Transportation Needs: No Transportation Needs (05/15/2022)   PRAPARE - Administrator, Civil Service (Medical): No    Lack of Transportation (Non-Medical): No  Physical Activity: Sufficiently Active (05/15/2022)   Exercise Vital Sign    Days of Exercise per Week: 5 days    Minutes of Exercise per Session: 30 min  Stress: No Stress Concern Present (05/15/2022)   [employer] of Occupational Health - Occupational Stress Questionnaire    Feeling of Stress : Not at all  Social Connections: Moderately Integrated (05/15/2022)   Social Connection and Isolation Panel [NHANES]    Frequency of Communication with Friends and Family: More than three times a week    Frequency of Social Gatherings with Friends and Family: More than three times a week    Attends Religious Services: More than 4 times per year    Active Member of [employer] or Organizations: Yes    Attends Banker Meetings: More than 4 times per year    Marital Status: Widowed   Intimate Partner Violence: Not At Risk (05/15/2022)   Humiliation, Afraid, Rape, and Kick questionnaire    Fear of Current or Ex-Partner: No    Emotionally Abused: No    Physically Abused: No    Sexually Abused: No    Family History  Problem Relation Age of Onset   Dementia Mother    Breast cancer Mother 77   Heart disease Father    Stroke Father    Dementia Sister    Colon cancer Neg Hx    Stomach cancer Neg Hx    Rectal cancer Neg Hx    Esophageal cancer Neg Hx    Colon polyps Neg Hx    Pancreatic cancer Neg Hx  Current Outpatient Medications  Medication Sig Dispense Refill   acetaminophen (TYLENOL) 650 MG CR tablet Take 1,300 mg by mouth every 8 (eight) hours as needed for pain.     amLODipine (NORVASC) 10 MG tablet Take 1 tablet (10 mg total) by mouth daily. 90 tablet 3   Apoaequorin (PREVAGEN PO) Take 20 mg by mouth daily.     Ascorbic Acid (VITAMIN C) 1000 MG tablet Take 1,000 mg by mouth daily.     aspirin EC 81 MG tablet Take 81 mg by mouth. Swallow whole.     CALCIUM-MAGNESIUM-ZINC PO Take 3 tablets by mouth daily.     Cholecalciferol (DIALYVITE VITAMIN D 5000) 125 MCG (5000 UT) capsule Take 5,000 Units by mouth daily.     denosumab (PROLIA) 60 MG/ML SOLN injection Inject 60 mg into the skin every 6 (six) months. Administer in upper arm, thigh, or abdomen     diclofenac (VOLTAREN) 75 MG EC tablet Take 1 tablet (75 mg total) by mouth 2 (two) times daily. 60 tablet 3   hydrocortisone 2.5 % cream Apply topically 2 (two) times daily. 100 g 3   latanoprost (XALATAN) 0.005 % ophthalmic solution Place 1 drop into both eyes at bedtime.     mirtazapine (REMERON) 7.5 MG tablet Take 1 tablet (7.5 mg total) by mouth at bedtime. 30 tablet 6   multivitamin-lutein (OCUVITE-LUTEIN) CAPS capsule Take 1 capsule by mouth daily.     mupirocin ointment (BACTROBAN) 2 % APPLY TO THE AFFECTED AREA(S) TOPICALLY TWICE DAILY 30 g 11   pantoprazole (PROTONIX) 40 MG tablet TAKE ONE TABLET BY  MOUTH DAILY 90 tablet 1   timolol (TIMOPTIC) 0.5 % ophthalmic solution Place 1 drop into both eyes 2 (two) times daily.     vitamin B-12 (CYANOCOBALAMIN) 1000 MCG tablet Take 1,000 mcg by mouth daily.     vitamin E 400 UNIT capsule Take 400 Units by mouth daily.     No current facility-administered medications for this visit.    Allergies  Allergen Reactions   Sulfa Antibiotics Rash   Sulfasalazine Hives and Rash   Nabumetone Other (See Comments)    Feel weird     Naproxen Sodium Hives   Tramadol Hcl Nausea And Vomiting     REVIEW OF SYSTEMS:   [X]  denotes positive finding, [ ]  denotes negative finding Cardiac  Comments:  Chest pain or chest pressure:    Shortness of breath upon exertion:    Short of breath when lying flat:    Irregular heart rhythm:        Vascular    Pain in calf, thigh, or hip brought on by ambulation:    Pain in feet at night that wakes you up from your sleep:     Blood clot in your veins:    Leg swelling:         Pulmonary    Oxygen at home:    Productive cough:     Wheezing:         Neurologic    Sudden weakness in arms or legs:     Sudden numbness in arms or legs:     Sudden onset of difficulty speaking or slurred speech:    Temporary loss of vision in one eye:     Problems with dizziness:         Gastrointestinal    Blood in stool:     Vomited blood:         Genitourinary    Burning when  urinating:     Blood in urine:        Psychiatric    Major depression:         Hematologic    Bleeding problems:    Problems with blood clotting too easily:        Skin    Rashes or ulcers:        Constitutional    Fever or chills:      PHYSICAL EXAMINATION:  There were no vitals filed for this visit.  General:  WDWN in NAD; vital signs documented above Gait: Not observed HENT: WNL, normocephalic Pulmonary: normal non-labored breathing without wheezing Cardiac: regular HR Abdomen: soft, NT, no masses Skin: without rashes, scaly  dry skin on legs Vascular Exam/Pulses: 2+ femoral, 2+ DP and PT pulses bilaterally Extremities: without ischemic changes, without Gangrene , without cellulitis; without open wounds; Right leg edematous > left leg. Scars on BLE from healed venous ulcerations/ dermatitis Musculoskeletal: no muscle wasting or atrophy  Neurologic: A&O X 3 Psychiatric:  The pt has Normal affect.   Non-Invasive Vascular Imaging:    Venous Reflux Times  +--------------+---------+------+-----------+------------+--------+  RIGHT        Reflux NoRefluxReflux TimeDiameter cmsComments                          Yes                                   +--------------+---------+------+-----------+------------+--------+  CFV          no                                    954 ms    +--------------+---------+------+-----------+------------+--------+  FV prox       no                                              +--------------+---------+------+-----------+------------+--------+  FV mid        no                                              +--------------+---------+------+-----------+------------+--------+  FV dist       no                                              +--------------+---------+------+-----------+------------+--------+  Popliteal    no                                              +--------------+---------+------+-----------+------------+--------+  GSV at SFJ              yes    >500 ms     0.399              +--------------+---------+------+-----------+------------+--------+  GSV prox thighno  0.335              +--------------+---------+------+-----------+------------+--------+  GSV mid thigh           yes    >500 ms     0.335              +--------------+---------+------+-----------+------------+--------+  GSV dist thigh          yes    >500 ms     0.389               +--------------+---------+------+-----------+------------+--------+  GSV at knee             yes    >500 ms     0.347              +--------------+---------+------+-----------+------------+--------+  GSV prox calf no                            0.28              +--------------+---------+------+-----------+------------+--------+  SSV Pop Fossa no                           0.154              +--------------+---------+------+-----------+------------+--------+  SSV prox calf           yes    >500 ms     0.218              +--------------+---------+------+-----------+------------+--------+  SSV mid calf            yes    >500 ms     0.202              +--------------+---------+------+-----------+------------+--------+   Summary:  Right:  - No evidence of deep vein thrombosis seen in the right lower extremity, from the common femoral through the popliteal veins.  - No evidence of superficial venous thrombosis in the right lower extremity.   - No evidence of signiificant deep vein reflux.   - Superficial vein reflux in the SSV, SFJ, and the GSV as above.    - Interstitial fluid in the calf.    ASSESSMENT/PLAN:: 89 y.o. female here for follow up for R > left lower extremity swelling. Long history of venous insufficiency. She is without pain. She does use compression and elevates as recommended.  - duplex today continues to show Superficial reflux in the SSV, SFJ, and GSV. No DVT or SVT. No deep reflux. Noted to have a lot of interstitial fluid in the calf. Based on her duplex her GSV remains too small to be considered for lazer ablation.  - Provided reassurance that she does not have any DVT or limb threatening issue -Encourage her to continue daily elevation and compression - Recommended that she get a new pair of 15-20 mmHg knee high compression stockings from [place] today as her current pair seems very loose. She was measured and fitted for a pair at today's  visit.  - continue her exercise regimen/ walking as tolerated - She can follow up as needed if new or worsening symptoms    Graceann Congress, PA-C [facility] [phone]  Clinic MD:   Myra Gianotti

## 2023-09-08 ENCOUNTER — Other Ambulatory Visit: Payer: Self-pay | Admitting: Internal Medicine

## 2023-09-10 ENCOUNTER — Encounter: Payer: Self-pay | Admitting: Internal Medicine

## 2023-09-10 ENCOUNTER — Ambulatory Visit: Payer: Medicare PPO | Admitting: Internal Medicine

## 2023-09-10 VITALS — BP 126/80 | HR 78 | Temp 98.1°F | Ht 60.0 in | Wt 127.0 lb

## 2023-09-10 DIAGNOSIS — E785 Hyperlipidemia, unspecified: Secondary | ICD-10-CM | POA: Diagnosis not present

## 2023-09-10 DIAGNOSIS — N1832 Chronic kidney disease, stage 3b: Secondary | ICD-10-CM | POA: Diagnosis not present

## 2023-09-10 DIAGNOSIS — I10 Essential (primary) hypertension: Secondary | ICD-10-CM

## 2023-09-10 LAB — CBC
HCT: 36.9 % (ref 36.0–46.0)
Hemoglobin: 12.2 g/dL (ref 12.0–15.0)
MCHC: 33.2 g/dL (ref 30.0–36.0)
MCV: 90.2 fl (ref 78.0–100.0)
Platelets: 343 10*3/uL (ref 150.0–400.0)
RBC: 4.09 Mil/uL (ref 3.87–5.11)
RDW: 14.8 % (ref 11.5–15.5)
WBC: 7 10*3/uL (ref 4.0–10.5)

## 2023-09-10 LAB — COMPREHENSIVE METABOLIC PANEL WITH GFR
ALT: 18 U/L (ref 0–35)
AST: 20 U/L (ref 0–37)
Albumin: 4.2 g/dL (ref 3.5–5.2)
Alkaline Phosphatase: 104 U/L (ref 39–117)
BUN: 25 mg/dL — ABNORMAL HIGH (ref 6–23)
CO2: 31 meq/L (ref 19–32)
Calcium: 9.9 mg/dL (ref 8.4–10.5)
Chloride: 103 meq/L (ref 96–112)
Creatinine, Ser: 1.24 mg/dL — ABNORMAL HIGH (ref 0.40–1.20)
GFR: 38.11 mL/min — ABNORMAL LOW (ref >60.00)
Glucose, Bld: 113 mg/dL — ABNORMAL HIGH (ref 70–99)
Potassium: 4.4 meq/L (ref 3.5–5.1)
Sodium: 142 meq/L (ref 135–145)
Total Bilirubin: 0.6 mg/dL (ref 0.2–1.2)
Total Protein: 7.5 g/dL (ref 6.0–8.3)

## 2023-09-10 LAB — LIPID PANEL
Cholesterol: 172 mg/dL (ref 0–200)
HDL: 65.2 mg/dL (ref >39.00)
LDL Cholesterol: 83 mg/dL (ref 0–99)
NonHDL: 106.7
Total CHOL/HDL Ratio: 3
Triglycerides: 119 mg/dL (ref 0.0–149.0)
VLDL: 23.8 mg/dL (ref 0.0–40.0)

## 2023-09-10 NOTE — Patient Instructions (Signed)
 We will check the labs today.

## 2023-09-10 NOTE — Progress Notes (Signed)
   Subjective:   Patient ID: Leah [name], female    DOB: 1929/09/13, 89 y.o.   MRN: [id number]  HPI The patient is a 89 YO female coming in for medical management (see A/P for details)  Review of Systems  Constitutional:  Positive for fatigue.  HENT: Negative.    Eyes: Negative.   Respiratory:  Negative for cough, chest tightness and shortness of breath.   Cardiovascular:  Negative for chest pain, palpitations and leg swelling.  Gastrointestinal:  Negative for abdominal distention, abdominal pain, constipation, diarrhea, nausea and vomiting.  Musculoskeletal:  Positive for arthralgias and gait problem.  Skin: Negative.   Psychiatric/Behavioral: Negative.      Objective:  Physical Exam Constitutional:      Appearance: She is well-developed.  HENT:     Head: Normocephalic and atraumatic.  Cardiovascular:     Rate and Rhythm: Normal rate and regular rhythm.  Pulmonary:     Effort: Pulmonary effort is normal. No respiratory distress.     Breath sounds: Normal breath sounds. No wheezing or rales.  Abdominal:     General: Bowel sounds are normal. There is no distension.     Palpations: Abdomen is soft.     Tenderness: There is no abdominal tenderness. There is no rebound.  Musculoskeletal:        General: Tenderness present.     Cervical back: Normal range of motion.  Skin:    General: Skin is warm and dry.  Neurological:     Mental Status: She is alert and oriented to person, place, and time.     Coordination: Coordination abnormal.     Vitals:   09/10/23 1102  BP: 126/80  Pulse: 78  Temp: 98.1 F (36.7 C)  TempSrc: Oral  SpO2: 93%  Weight: 127 lb (57.6 kg)  Height: 5' (1.524 m)    Assessment & Plan:

## 2023-09-10 NOTE — Assessment & Plan Note (Signed)
 Checking lipid panel and adjust as needed. Diet controlled currently.

## 2023-09-10 NOTE — Assessment & Plan Note (Signed)
 BP at goal on amlodipine 10 mg daily. Checking CMP and CBC and adjust as needed. Checking lipid panel as well.

## 2023-09-10 NOTE — Assessment & Plan Note (Signed)
 Checking CMP and adjust as needed. Stage 3b and BP at goal no DM.

## 2023-11-12 DIAGNOSIS — H52203 Unspecified astigmatism, bilateral: Secondary | ICD-10-CM | POA: Diagnosis not present

## 2023-11-12 DIAGNOSIS — H524 Presbyopia: Secondary | ICD-10-CM | POA: Diagnosis not present

## 2023-11-12 DIAGNOSIS — H04123 Dry eye syndrome of bilateral lacrimal glands: Secondary | ICD-10-CM | POA: Diagnosis not present

## 2023-11-12 DIAGNOSIS — H401132 Primary open-angle glaucoma, bilateral, moderate stage: Secondary | ICD-10-CM | POA: Diagnosis not present

## 2023-11-12 DIAGNOSIS — H43813 Vitreous degeneration, bilateral: Secondary | ICD-10-CM | POA: Diagnosis not present

## 2023-11-12 DIAGNOSIS — H26493 Other secondary cataract, bilateral: Secondary | ICD-10-CM | POA: Diagnosis not present

## 2023-11-17 DIAGNOSIS — H26491 Other secondary cataract, right eye: Secondary | ICD-10-CM | POA: Diagnosis not present

## 2023-12-16 ENCOUNTER — Encounter: Payer: Self-pay | Admitting: Internal Medicine

## 2023-12-16 ENCOUNTER — Ambulatory Visit (INDEPENDENT_AMBULATORY_CARE_PROVIDER_SITE_OTHER): Payer: Self-pay | Admitting: Internal Medicine

## 2023-12-16 VITALS — BP 124/68 | HR 87 | Temp 97.9°F | Ht 60.0 in | Wt 128.0 lb

## 2023-12-16 DIAGNOSIS — M542 Cervicalgia: Secondary | ICD-10-CM | POA: Diagnosis not present

## 2023-12-16 DIAGNOSIS — N1832 Chronic kidney disease, stage 3b: Secondary | ICD-10-CM | POA: Diagnosis not present

## 2023-12-16 DIAGNOSIS — I10 Essential (primary) hypertension: Secondary | ICD-10-CM | POA: Diagnosis not present

## 2023-12-16 LAB — CBC
HCT: 39.6 % (ref 36.0–46.0)
Hemoglobin: 13.1 g/dL (ref 12.0–15.0)
MCHC: 33.1 g/dL (ref 30.0–36.0)
MCV: 88.3 fl (ref 78.0–100.0)
Platelets: 270 K/uL (ref 150.0–400.0)
RBC: 4.48 Mil/uL (ref 3.87–5.11)
RDW: 15.4 % (ref 11.5–15.5)
WBC: 5.9 K/uL (ref 4.0–10.5)

## 2023-12-16 LAB — COMPREHENSIVE METABOLIC PANEL WITH GFR
ALT: 17 U/L (ref 0–35)
AST: 23 U/L (ref 0–37)
Albumin: 4.2 g/dL (ref 3.5–5.2)
Alkaline Phosphatase: 74 U/L (ref 39–117)
BUN: 24 mg/dL — ABNORMAL HIGH (ref 6–23)
CO2: 28 meq/L (ref 19–32)
Calcium: 10 mg/dL (ref 8.4–10.5)
Chloride: 106 meq/L (ref 96–112)
Creatinine, Ser: 1.1 mg/dL (ref 0.40–1.20)
GFR: 43.92 mL/min — ABNORMAL LOW (ref >60.00)
Glucose, Bld: 90 mg/dL (ref 70–99)
Potassium: 4.1 meq/L (ref 3.5–5.1)
Sodium: 142 meq/L (ref 135–145)
Total Bilirubin: 0.6 mg/dL (ref 0.2–1.2)
Total Protein: 7.3 g/dL (ref 6.0–8.3)

## 2023-12-16 MED ORDER — PREDNISONE 20 MG PO TABS: 40.0000 mg | ORAL_TABLET | Freq: Every day | ORAL | 0 refills | Status: DC

## 2023-12-16 NOTE — Progress Notes (Unsigned)
   Subjective:   Patient ID: Leah [name], female    DOB: November 20, 1929, 89 y.o.   MRN: [id number]  HPI The patient is a 89 YO female coming in for medical management as well as new neck pain.  Review of Systems  Constitutional: Negative.   HENT: Negative.    Eyes: Negative.   Respiratory:  Negative for cough, chest tightness and shortness of breath.   Cardiovascular:  Positive for leg swelling. Negative for chest pain and palpitations.  Gastrointestinal:  Negative for abdominal distention, abdominal pain, constipation, diarrhea, nausea and vomiting.  Musculoskeletal:  Positive for arthralgias, back pain, myalgias and neck pain. Negative for neck stiffness.  Skin: Negative.   Neurological: Negative.   Psychiatric/Behavioral: Negative.      Objective:  Physical Exam Constitutional:      Appearance: She is well-developed.  HENT:     Head: Normocephalic and atraumatic.  Cardiovascular:     Rate and Rhythm: Normal rate and regular rhythm.  Pulmonary:     Effort: Pulmonary effort is normal. No respiratory distress.     Breath sounds: Normal breath sounds. No wheezing or rales.  Abdominal:     General: Bowel sounds are normal. There is no distension.     Palpations: Abdomen is soft.     Tenderness: There is no abdominal tenderness. There is no rebound.  Musculoskeletal:        General: Tenderness present.     Cervical back: Normal range of motion.     Right lower leg: Edema present.     Left lower leg: Edema present.     Comments: Tenderness lateral left neck, full ROM and no spinal tenderness  Skin:    General: Skin is warm and dry.  Neurological:     Mental Status: She is alert and oriented to person, place, and time.     Coordination: Coordination normal.     Vitals:   12/16/23 1114  BP: 124/68  Pulse: 87  Temp: 97.9 F (36.6 C)  TempSrc: Oral  SpO2: 99%  Weight: 128 lb (58.1 kg)  Height: 5' (1.524 m)    Assessment & Plan:

## 2023-12-16 NOTE — Patient Instructions (Addendum)
 We will check the labs today. We have sent in prednisone  to take 2 pills daily for 5 days.  Carpal tunnel braces for your hands.

## 2023-12-17 ENCOUNTER — Ambulatory Visit: Payer: Self-pay | Admitting: Internal Medicine

## 2023-12-17 DIAGNOSIS — M542 Cervicalgia: Secondary | ICD-10-CM | POA: Insufficient documentation

## 2023-12-17 NOTE — Assessment & Plan Note (Signed)
 Rx prednisone  for likely muscular etiology. She did not have spinal tenderness. If no improvement can consider x-ray.

## 2023-12-17 NOTE — Assessment & Plan Note (Signed)
BP at goal on amlodipine and checking CMP. Adjust as needed.

## 2023-12-17 NOTE — Assessment & Plan Note (Signed)
 Checking CMP for stability and adjust as needed. She has had stable GFR between 35-40 for some time.

## 2024-01-11 ENCOUNTER — Other Ambulatory Visit: Payer: Self-pay | Admitting: Internal Medicine

## 2024-01-21 ENCOUNTER — Other Ambulatory Visit: Payer: Self-pay | Admitting: Internal Medicine

## 2024-01-21 DIAGNOSIS — K2100 Gastro-esophageal reflux disease with esophagitis, without bleeding: Secondary | ICD-10-CM

## 2024-01-21 DIAGNOSIS — K259 Gastric ulcer, unspecified as acute or chronic, without hemorrhage or perforation: Secondary | ICD-10-CM

## 2024-01-31 IMAGING — MR MR ABDOMEN WO/W CM MRCP
19 of 21 series · 45 of 48 positions shown · IV contrast (Gadavist)
Comparison: CT October 31, 2021.

CLINICAL DATA: Choledocholithiasis.

EXAM:
MRI ABDOMEN WITHOUT AND WITH CONTRAST (INCLUDING MRCP)
TECHNIQUE: Multiplanar multisequence MR imaging of the abdomen was performed
both before and after the administration of intravenous contrast.
Heavily T2-weighted images of the biliary and pancreatic ducts were
obtained, and three-dimensional MRCP images were rendered by post
processing.
CONTRAST:  6mL GADAVIST GADOBUTROL 1 MMOL/ML IV SOLN

[Series 3: ax haste · axial · 6.0mm · 1.19mm/px · 1 of 30 slices shown]
[im 1/30]
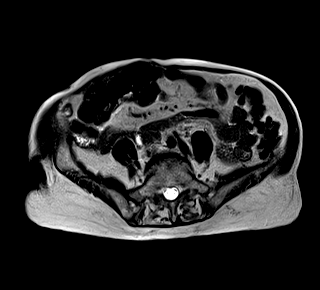

[Series 5: cor haste · coronal · 6.0mm · 1.25mm/px · 1 of 30 slices shown]
[im 1/30]
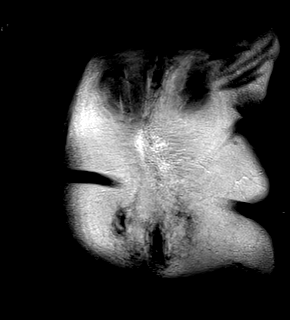

[Series 8: T2 fat-sat · axial · 6.0mm · 1.19mm/px · 1 of 30 slices shown]
[im 1/30]
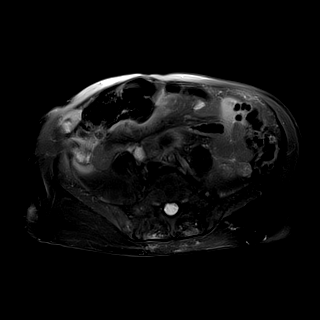

[Series 9: DWI · axial · 6.0mm · 1.42mm/px · z∈[-191,+17]mm · 3 of 90 slices shown (1 of 2)]
[im 1/90]
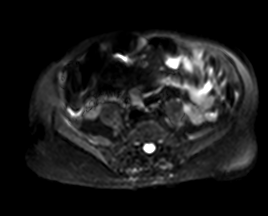
[im 45/90]
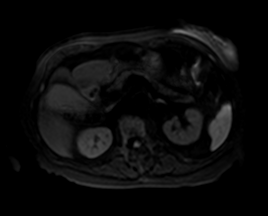
[im 90/90]
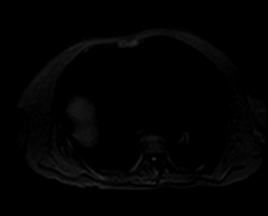

[Series 10: DWI · axial · 6.0mm · 1.42mm/px · 1 of 30 slices shown (2 of 2)]
[im 1/30]
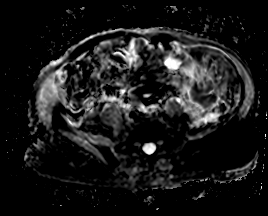

[Series 18: MRCP · coronal · 4.0mm · 1.12mm/px · 1 of 18 slices shown]
[im 1/18]
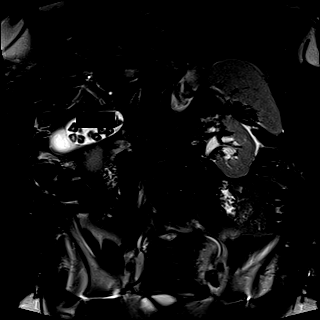

[Series 19: ax in and · axial · 3.0mm · 1.19mm/px · z∈[-193,+20]mm · 3 of 72 slices shown (1 of 2)]
[im 1/72]
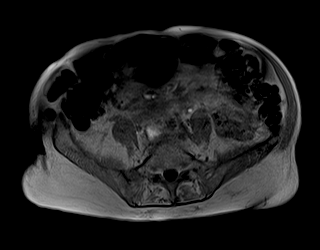
[im 36/72]
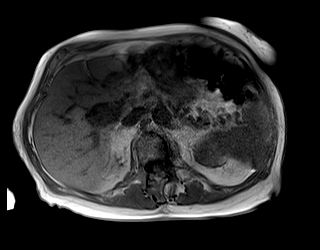
[im 72/72]
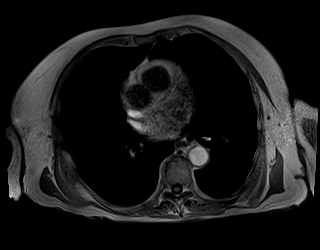

[Series 19: ax in and · axial · 3.0mm · 1.19mm/px · z∈[-193,+20]mm · 3 of 72 slices shown (2 of 2)]
[im 1/72]
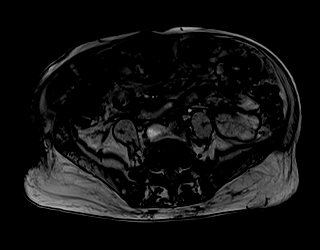
[im 36/72]
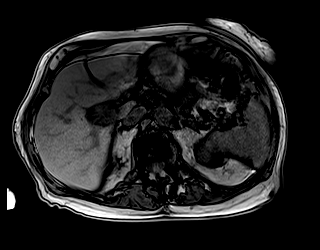
[im 72/72]
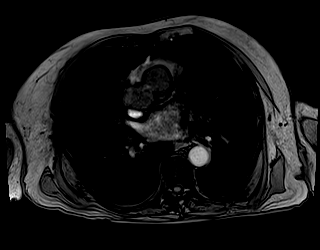

[Series 20: radials · coronal · 50.0mm · 0.78mm/px · 1 of 5 slices shown]
[im 1/5]
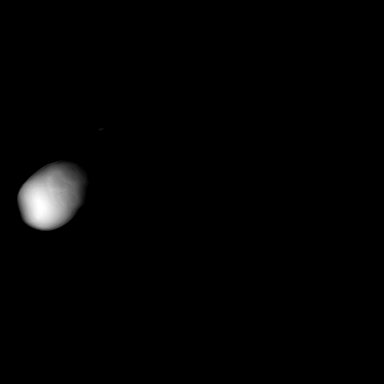

[Series 21: T1 dynamic · axial · non-contrast · 3.0mm · 1.19mm/px · z∈[-183,+30]mm · 3 of 72 slices shown (1 of 5)]
[im 1/72]
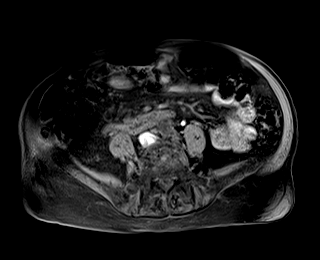
[im 36/72]
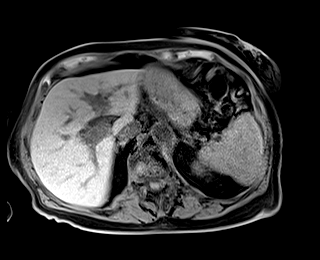
[im 72/72]
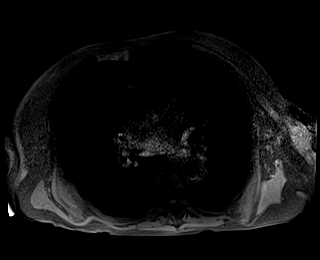

[Series 23: T1 dynamic post-contrast · axial · 3.0mm · 1.19mm/px · z∈[-183,+30]mm · 3 of 72 slices shown (1 of 5)]
[im 1/72]
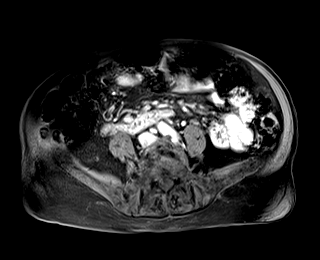
[im 36/72]
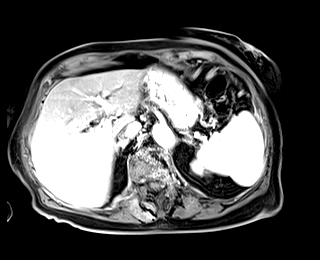
[im 72/72]
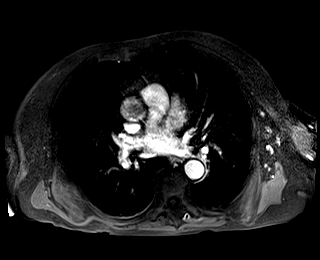

[Series 24: T1 dynamic · axial · 3.0mm · 1.19mm/px · z∈[-183,+30]mm · 3 of 72 slices shown (2 of 5)]
[im 1/72]
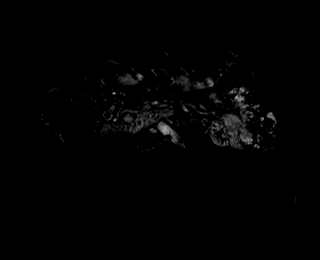
[im 36/72]
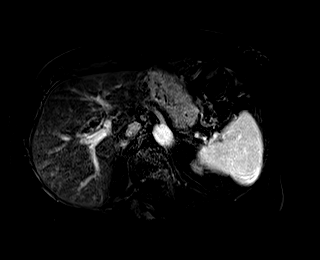
[im 72/72]
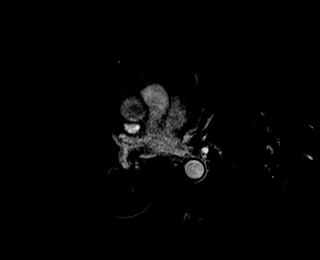

[Series 25: T1 dynamic post-contrast · axial · 3.0mm · 1.19mm/px · z∈[-183,+30]mm · 3 of 72 slices shown (2 of 5)]
[im 1/72]
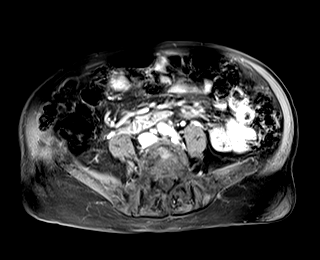
[im 36/72]
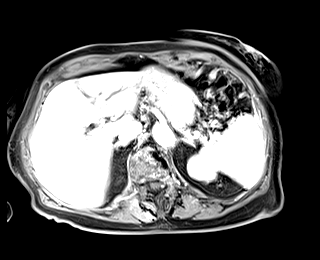
[im 72/72]
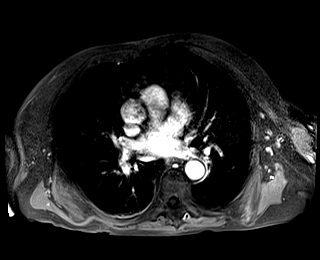

[Series 26: T1 dynamic · axial · 3.0mm · 1.19mm/px · z∈[-183,+30]mm · 3 of 72 slices shown (3 of 5)]
[im 1/72]
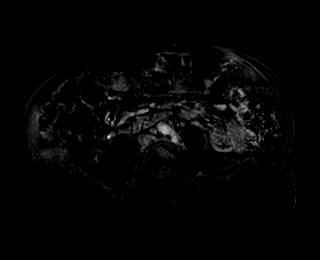
[im 36/72]
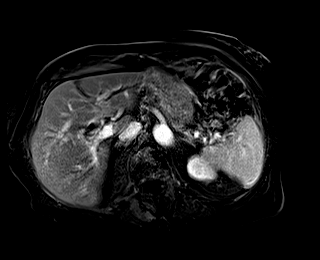
[im 72/72]
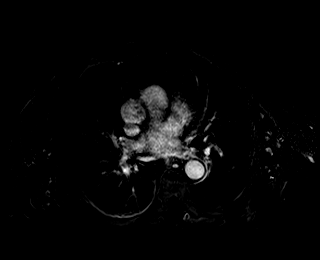

[Series 27: T1 dynamic post-contrast · axial · 3.0mm · 1.19mm/px · z∈[-183,+30]mm · 3 of 72 slices shown (3 of 5)]
[im 1/72]
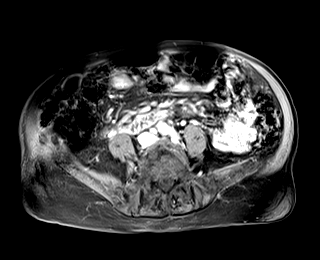
[im 36/72]
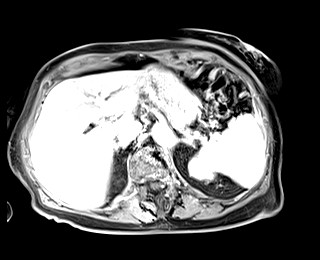
[im 72/72]
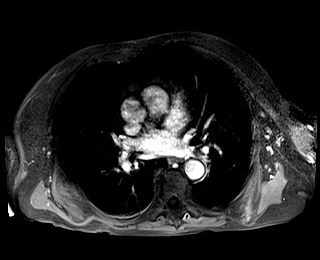

[Series 28: T1 dynamic · axial · 3.0mm · 1.19mm/px · z∈[-183,+30]mm · 3 of 72 slices shown (4 of 5)]
[im 1/72]
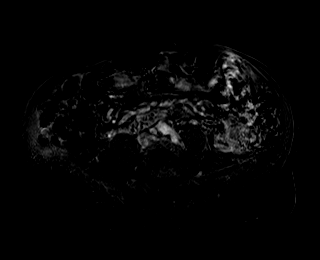
[im 36/72]
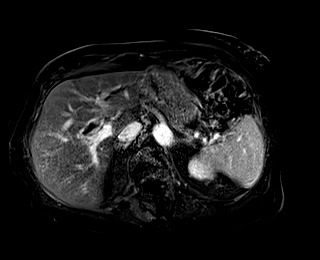
[im 72/72]
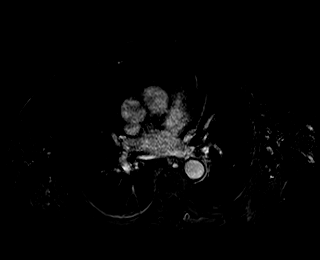

[Series 29: T1 dynamic post-contrast · axial · 3.0mm · 1.19mm/px · z∈[-183,+30]mm · 3 of 72 slices shown (4 of 5)]
[im 1/72]
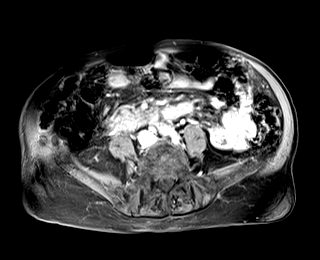
[im 36/72]
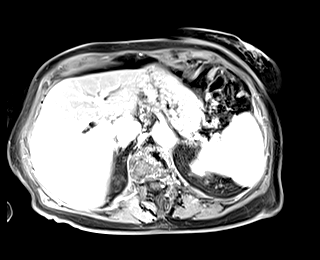
[im 72/72]
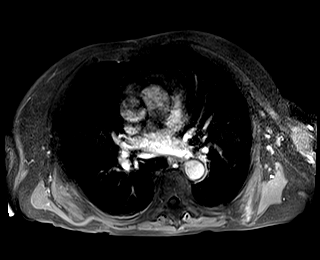

[Series 30: T1 dynamic · axial · 3.0mm · 1.19mm/px · z∈[-183,+30]mm · 3 of 72 slices shown (5 of 5)]
[im 1/72]
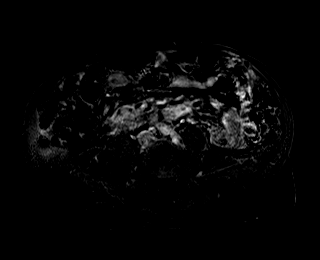
[im 36/72]
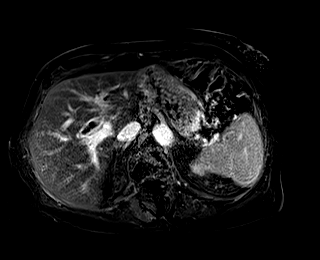
[im 72/72]
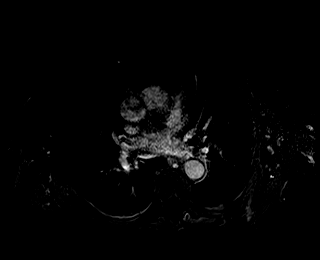

[Series 31: T1 dynamic post-contrast · coronal · 3.0mm · 1.64mm/px · 3 of 64 slices shown (5 of 5)]
[im 1/64]
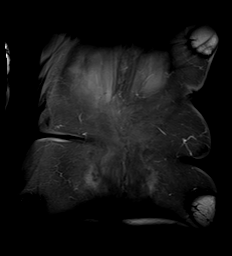
[im 32/64]
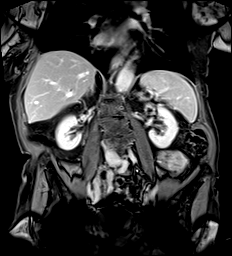
[im 64/64]
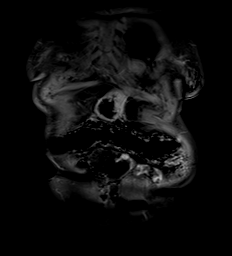

[45 of 48 positions shown; findings below may reference images not displayed]

FINDINGS: Lower chest: No acute abnormality.

Hepatobiliary: No significant hepatic steatosis. No suspicious
hepatic lesion.

Cholelithiasis in a distended gallbladder with gallbladder wall
thickening.

Choledocholithiasis measure up to 14 mm. Dilation of the
extrahepatic and central intrahepatic biliary tree with the common
bile duct measuring 13 mm in diameter. Subtle peribiliary
enhancement.

Pancreas: Intrinsic T1 signal of the pancreatic parenchyma is within
normal limits. No pancreatic ductal dilation. Homogeneous
postcontrast enhancement of the pancreatic parenchyma. No cystic or
solid hyperenhancing pancreatic lesion identified.

Spleen:  Within normal limits in size and appearance.

Adrenals/Urinary Tract: Bilateral adrenal glands appear normal. No
hydronephrosis. No solid enhancing renal mass.

Stomach/Bowel: Colonic diverticulosis without findings of acute
diverticulitis. No evidence of bowel obstruction.

Vascular/Lymphatic: Normal caliber abdominal aorta. The portal,
splenic and superior mesenteric veins are patent. No pathologically
enlarged abdominal lymph nodes.

Other:  No significant abdominal free fluid.

Musculoskeletal: S shaped curvature of the thoracolumbar spine with
associated degenerative change. No suspicious osseous lesion
identified.
IMPRESSION: 1. Choledocholithiasis measuring up to 14 mm trauma with dilation of
the extra and central intrahepatic biliary tree as well as subtle
peribiliary enhancement, suggest clinical correlation for
signs/symptoms of ascending cholangitis.
2. Cholelithiasis in a distended gallbladder with gallbladder wall
thickening possibly reflecting acute cholecystitis.

## 2024-02-04 ENCOUNTER — Other Ambulatory Visit: Payer: Self-pay | Admitting: Internal Medicine

## 2024-02-25 ENCOUNTER — Other Ambulatory Visit: Payer: Self-pay | Admitting: Internal Medicine

## 2024-02-25 DIAGNOSIS — M25562 Pain in left knee: Secondary | ICD-10-CM | POA: Diagnosis not present

## 2024-03-15 ENCOUNTER — Encounter: Payer: Self-pay | Admitting: Internal Medicine

## 2024-03-15 ENCOUNTER — Ambulatory Visit: Admitting: Internal Medicine

## 2024-03-15 VITALS — BP 114/60 | HR 72 | Temp 98.1°F | Ht 60.0 in | Wt 129.0 lb

## 2024-03-15 DIAGNOSIS — N3946 Mixed incontinence: Secondary | ICD-10-CM

## 2024-03-15 DIAGNOSIS — N1832 Chronic kidney disease, stage 3b: Secondary | ICD-10-CM | POA: Diagnosis not present

## 2024-03-15 LAB — COMPREHENSIVE METABOLIC PANEL WITH GFR
ALT: 15 U/L (ref 0–35)
AST: 20 U/L (ref 0–37)
Albumin: 4.2 g/dL (ref 3.5–5.2)
Alkaline Phosphatase: 73 U/L (ref 39–117)
BUN: 24 mg/dL — ABNORMAL HIGH (ref 6–23)
CO2: 30 meq/L (ref 19–32)
Calcium: 9.7 mg/dL (ref 8.4–10.5)
Chloride: 105 meq/L (ref 96–112)
Creatinine, Ser: 1.19 mg/dL (ref 0.40–1.20)
GFR: 39.89 mL/min — ABNORMAL LOW (ref >60.00)
Glucose, Bld: 88 mg/dL (ref 70–99)
Potassium: 4.3 meq/L (ref 3.5–5.1)
Sodium: 142 meq/L (ref 135–145)
Total Bilirubin: 0.7 mg/dL (ref 0.2–1.2)
Total Protein: 7 g/dL (ref 6.0–8.3)

## 2024-03-15 LAB — CBC
HCT: 38.2 % (ref 36.0–46.0)
Hemoglobin: 12.7 g/dL (ref 12.0–15.0)
MCHC: 33.1 g/dL (ref 30.0–36.0)
MCV: 91.2 fl (ref 78.0–100.0)
Platelets: 280 K/uL (ref 150.0–400.0)
RBC: 4.19 Mil/uL (ref 3.87–5.11)
RDW: 14.3 % (ref 11.5–15.5)
WBC: 7.5 K/uL (ref 4.0–10.5)

## 2024-03-15 MED ORDER — MIRABEGRON ER 50 MG PO TB24: 50.0000 mg | ORAL_TABLET | Freq: Every day | ORAL | 1 refills | Status: DC

## 2024-03-15 NOTE — Patient Instructions (Signed)
 We have sent in the myrbetriq  to try for bladder.

## 2024-03-15 NOTE — Progress Notes (Signed)
   Subjective:   Patient ID: Leah [name], female    DOB: 1929/06/27, 89 y.o.   MRN: [id number]  Discussed the use of AI scribe software for clinical note transcription with the patient, who gave verbal consent to proceed.  History of Present Illness Leah [name] is a 89 year old female who presents with mobility issues and urinary frequency.  She experiences significant difficulty with walking, attributing her joint problems to aging. Balance issues are also present, as she states 'the balance is not there anymore'. Despite these challenges, she continues to live independently with her dog, and her children visit occasionally. No recent illness, chest pain, tightness, pressure, or breathing problems. She reports experiencing fatigue. Her appetite has decreased slightly, but she is making an effort to eat healthily. No new diarrhea, constipation, heartburn, or blood in her stool.  Urinary frequency is a major concern, as she describes her bladder as 'just works too much', necessitating frequent trips to the bathroom and causing her to wear pads, which she finds irritating. She wakes up every night to urinate  Review of Systems  Constitutional: Negative.   HENT: Negative.    Eyes: Negative.   Respiratory:  Negative for cough, chest tightness and shortness of breath.   Cardiovascular:  Negative for chest pain, palpitations and leg swelling.  Gastrointestinal:  Negative for abdominal distention, abdominal pain, constipation, diarrhea, nausea and vomiting.  Genitourinary:  Positive for enuresis, frequency and urgency.  Musculoskeletal:  Positive for arthralgias, back pain and gait problem.  Skin: Negative.   Psychiatric/Behavioral: Negative.      Objective:  Physical Exam Constitutional:      Appearance: She is well-developed.  HENT:     Head: Normocephalic and atraumatic.  Cardiovascular:     Rate and Rhythm: Normal rate and regular rhythm.  Pulmonary:     Effort: Pulmonary  effort is normal. No respiratory distress.     Breath sounds: Normal breath sounds. No wheezing or rales.  Abdominal:     General: Bowel sounds are normal. There is no distension.     Palpations: Abdomen is soft.     Tenderness: There is no abdominal tenderness.  Musculoskeletal:        General: Tenderness present.     Cervical back: Normal range of motion.  Skin:    General: Skin is warm and dry.  Neurological:     Mental Status: She is alert and oriented to person, place, and time.     Coordination: Coordination abnormal.     Vitals:   03/15/24 1108  BP: 114/60  Pulse: 72  Temp: 98.1 F (36.7 C)  TempSrc: Oral  SpO2: 98%  Weight: 129 lb (58.5 kg)  Height: 5' (1.524 m)    Assessment and Plan Assessment & Plan Overactive bladder with urinary frequency and nocturia   Discussed mirabegron  to reduce bladder spasms and frequency. It may take up to a month to show effects. It is not expected to eliminate symptoms but may reduce frequency and nocturia. Prescribe mirabegron  (Myrbetriq ) once daily. Advise that the medication may take up to a month for full effects.  Chronic kidney disease, stage 3b   Stage 3b with stable gfr. Order blood work to monitor kidney function.

## 2024-03-16 ENCOUNTER — Ambulatory Visit: Payer: Self-pay | Admitting: Internal Medicine

## 2024-03-16 NOTE — Assessment & Plan Note (Signed)
 Discussed mirabegron  to reduce bladder spasms and frequency. It may take up to a month to show effects. It is not expected to eliminate symptoms but may reduce frequency and nocturia. Prescribe mirabegron  (Myrbetriq ) once daily. Advise that the medication may take up to a month for full effects.

## 2024-03-16 NOTE — Telephone Encounter (Signed)
 Copied from CRM [id number]. Topic: Clinical - Medical Advice >> Mar 16, 2024 11:24 AM Thersia BROCKS wrote: Reason for CRM: Patient called in wanting to know what shots she need beside the flu shot , would like a callback as patient stated she has forgot

## 2024-03-16 NOTE — Assessment & Plan Note (Signed)
 Stable GFR checking CMP

## 2024-04-01 ENCOUNTER — Ambulatory Visit

## 2024-04-26 ENCOUNTER — Other Ambulatory Visit: Payer: Self-pay | Admitting: Internal Medicine

## 2024-05-20 ENCOUNTER — Ambulatory Visit: Admitting: Family Medicine

## 2024-06-15 ENCOUNTER — Ambulatory Visit: Admitting: Internal Medicine

## 2024-06-15 ENCOUNTER — Encounter: Payer: Self-pay | Admitting: Internal Medicine

## 2024-06-15 VITALS — BP 120/80 | HR 68 | Temp 97.5°F | Ht 60.0 in | Wt 126.6 lb

## 2024-06-15 DIAGNOSIS — N1832 Chronic kidney disease, stage 3b: Secondary | ICD-10-CM | POA: Diagnosis not present

## 2024-06-15 DIAGNOSIS — K219 Gastro-esophageal reflux disease without esophagitis: Secondary | ICD-10-CM

## 2024-06-15 DIAGNOSIS — E785 Hyperlipidemia, unspecified: Secondary | ICD-10-CM | POA: Diagnosis not present

## 2024-06-15 DIAGNOSIS — Z0000 Encounter for general adult medical examination without abnormal findings: Secondary | ICD-10-CM | POA: Diagnosis not present

## 2024-06-15 DIAGNOSIS — I5032 Chronic diastolic (congestive) heart failure: Secondary | ICD-10-CM | POA: Diagnosis not present

## 2024-06-15 DIAGNOSIS — I10 Essential (primary) hypertension: Secondary | ICD-10-CM | POA: Diagnosis not present

## 2024-06-15 LAB — COMPREHENSIVE METABOLIC PANEL WITH GFR
ALT: 18 U/L (ref 3–35)
AST: 25 U/L (ref 5–37)
Albumin: 4.1 g/dL (ref 3.5–5.2)
Alkaline Phosphatase: 99 U/L (ref 39–117)
BUN: 21 mg/dL (ref 6–23)
CO2: 27 meq/L (ref 19–32)
Calcium: 9.5 mg/dL (ref 8.4–10.5)
Chloride: 104 meq/L (ref 96–112)
Creatinine, Ser: 1.23 mg/dL — ABNORMAL HIGH (ref 0.40–1.20)
GFR: 38.27 mL/min — ABNORMAL LOW
Glucose, Bld: 85 mg/dL (ref 70–99)
Potassium: 4.1 meq/L (ref 3.5–5.1)
Sodium: 140 meq/L (ref 135–145)
Total Bilirubin: 0.7 mg/dL (ref 0.2–1.2)
Total Protein: 7.1 g/dL (ref 6.0–8.3)

## 2024-06-15 LAB — CBC
HCT: 39.6 % (ref 36.0–46.0)
Hemoglobin: 13.2 g/dL (ref 12.0–15.0)
MCHC: 33.4 g/dL (ref 30.0–36.0)
MCV: 89.8 fl (ref 78.0–100.0)
Platelets: 253 K/uL (ref 150.0–400.0)
RBC: 4.4 Mil/uL (ref 3.87–5.11)
RDW: 15.2 % (ref 11.5–15.5)
WBC: 6.1 K/uL (ref 4.0–10.5)

## 2024-06-15 LAB — LIPID PANEL
Cholesterol: 183 mg/dL (ref 28–200)
HDL: 71.8 mg/dL
LDL Cholesterol: 90 mg/dL (ref 10–99)
NonHDL: 111.32
Total CHOL/HDL Ratio: 3
Triglycerides: 107 mg/dL (ref 10.0–149.0)
VLDL: 21.4 mg/dL (ref 0.0–40.0)

## 2024-06-15 LAB — VITAMIN D 25 HYDROXY (VIT D DEFICIENCY, FRACTURES): VITD: 99.81 ng/mL (ref 30.00–100.00)

## 2024-06-15 MED ORDER — MIRABEGRON ER 50 MG PO TB24: 50.0000 mg | ORAL_TABLET | Freq: Every day | ORAL | 3 refills | Status: AC

## 2024-06-15 NOTE — Assessment & Plan Note (Signed)
 Checking CMP for stability and CKD 3b stable for some time. BP at goal and no known diabetes.

## 2024-06-15 NOTE — Progress Notes (Signed)
" ° °  Subjective:   Patient ID: Leah [name], female    DOB: [birth year], 89 y.o.   MRN: [id number]  The patient is here for physical. Pertinent topics discussed: Discussed the use of AI scribe software for clinical note transcription with the patient, who gave verbal consent to proceed.  History of Present Illness Leah [name] is a 89 year old female who presents with worsening hand pain and stiffness.  She experiences significant morning stiffness and pain in her hands. Symptoms are most severe upon waking and improve after about an hour. Relief is found by running her hands under hot water and performing exercises. No similar symptoms in her feet.  She has a history of knee pain, particularly in her 'one good knee,' which worsens with strenuous exercise. She has stopped attending the gym due to increased pain after workouts and now uses a pedal machine at home to maintain activity.  She uses medication for bladder control, effectively reducing urgency and frequency, allowing her to manage without pads at home.    PMH, [facility], social history reviewed and updated  Review of Systems  Constitutional: Negative.   HENT: Negative.    Eyes: Negative.   Respiratory:  Negative for cough, chest tightness and shortness of breath.   Cardiovascular:  Negative for chest pain, palpitations and leg swelling.  Gastrointestinal:  Negative for abdominal distention, abdominal pain, constipation, diarrhea, nausea and vomiting.  Musculoskeletal:  Positive for arthralgias.  Skin: Negative.   Neurological: Negative.   Psychiatric/Behavioral: Negative.      Objective:  Physical Exam Constitutional:      Appearance: She is well-developed.  HENT:     Head: Normocephalic and atraumatic.  Cardiovascular:     Rate and Rhythm: Normal rate and regular rhythm.  Pulmonary:     Effort: Pulmonary effort is normal. No respiratory distress.     Breath sounds: Normal breath sounds. No wheezing or rales.   Abdominal:     General: Bowel sounds are normal. There is no distension.     Palpations: Abdomen is soft.     Tenderness: There is no abdominal tenderness.  Musculoskeletal:     Cervical back: Normal range of motion.  Skin:    General: Skin is warm and dry.  Neurological:     Mental Status: She is alert and oriented to person, place, and time.     Coordination: Coordination abnormal.     Comments: cane     Vitals:   06/15/24 1044  BP: 120/80  Pulse: 68  Temp: (!) 97.5 F (36.4 C)  TempSrc: Oral  SpO2: 98%  Weight: 126 lb 9.6 oz (57.4 kg)  Height: 5' (1.524 m)    Assessment & Plan:    "

## 2024-06-15 NOTE — Assessment & Plan Note (Signed)
Flu shot up to date. Pneumonia complete. Shingrix complete. Tetanus up to date. Colonoscopy aged out. Mammogram aged out, pap smear aged out and dexa complete. Counseled about sun safety and mole surveillance. Counseled about the dangers of distracted driving. Given 10 year screening recommendations.

## 2024-06-15 NOTE — Patient Instructions (Signed)
 Copper fit  gloves to sleep it to help morning arthritis

## 2024-06-15 NOTE — Assessment & Plan Note (Signed)
 Checking lipid panel and adjust as needed.

## 2024-06-15 NOTE — Assessment & Plan Note (Signed)
 Controlled with protonix  and will continue.

## 2024-06-15 NOTE — Assessment & Plan Note (Signed)
 No flare today and is taking aspirin  daily. Continue monitoring.

## 2024-06-15 NOTE — Assessment & Plan Note (Signed)
 BP at goal and monitoring renal function quarterly. Continue same regimen.

## 2024-06-20 ENCOUNTER — Ambulatory Visit: Payer: Self-pay | Admitting: Internal Medicine

## 2024-06-21 ENCOUNTER — Ambulatory Visit

## 2024-06-21 VITALS — BP 120/80 | Ht 60.0 in | Wt 126.0 lb

## 2024-06-21 DIAGNOSIS — Z0000 Encounter for general adult medical examination without abnormal findings: Secondary | ICD-10-CM

## 2024-06-21 NOTE — Progress Notes (Signed)
 " I connected with  Leah [name] on 06/21/24 by a audio enabled telemedicine application and verified that I am speaking with the correct person using two identifiers.  Patient Location: Home  Provider Location: Home Office  Persons Participating in Visit: Patient.  I discussed the limitations of evaluation and management by telemedicine. The patient expressed understanding and agreed to proceed.  Vital Signs: Because this visit was a virtual/telehealth visit, some criteria may be missing or patient reported. Any vitals not documented were not able to be obtained and vitals that have been documented are patient reported.  Chief Complaint  Patient presents with   Medicare Wellness     Subjective:   Leah [name] is a 89 y.o. female who presents for a Medicare Annual Wellness Visit.  Visit info / Clinical Intake: Medicare Wellness Visit Type:: Subsequent Annual Wellness Visit Persons participating in visit and providing information:: patient Medicare Wellness Visit Mode:: Telephone If telephone:: video declined Since this visit was completed virtually, some vitals may be partially provided or unavailable. Missing vitals are due to the limitations of the virtual format.: Documented vitals are patient reported If Telephone or Video please confirm:: I connected with patient using audio/video enable telemedicine. I verified patient identity with two identifiers, discussed telehealth limitations, and patient agreed to proceed. Patient Location:: home Provider Location:: home office Interpreter Needed?: No Pre-visit prep was completed: yes AWV questionnaire completed by patient prior to visit?: no Living arrangements:: (!) lives alone Patient's Overall Health Status Rating: very good Typical amount of pain: none Does pain affect daily life?: no Are you currently prescribed opioids?: no  Dietary Habits and Nutritional Risks How many meals a day?: 3 Eats fruit and vegetables  daily?: yes Most meals are obtained by: preparing own meals; eating out In the last 2 weeks, have you had any of the following?: none Diabetic:: no  Functional Status Activities of Daily Living (to include ambulation/medication): Independent Feeding: Independent Dressing/Grooming: Independent Bathing: Independent Toileting: Independent Transfer: Independent Ambulation: Independent with device- listed below Home Assistive Devices/Equipment: Cane Medication Administration: Independent Home Management (perform basic housework or laundry): Independent Manage your own finances?: yes Primary transportation is: driving Concerns about vision?: no *vision screening is required for WTM* Concerns about hearing?: no  Fall Screening Falls in the past year?: 0 Number of falls in past year: 0 Was there an injury with Fall?: 0 Fall Risk Category Calculator: 0 Patient Fall Risk Level: Low Fall Risk  Fall Risk Patient at Risk for Falls Due to: No Fall Risks Fall risk Follow up: Falls evaluation completed  Home and Transportation Safety: All rugs have non-skid backing?: yes All stairs or steps have railings?: yes Grab bars in the bathtub or shower?: yes Have non-skid surface in bathtub or shower?: yes Good home lighting?: yes Regular seat belt use?: yes Hospital stays in the last year:: no  Cognitive Assessment Difficulty concentrating, remembering, or making decisions? : no Will 6CIT or Mini Cog be Completed: yes What year is it?: 0 points What month is it?: 0 points Give patient an address phrase to remember (5 components): remember words apple , table, penny About what time is it?: 0 points Count backwards from 20 to 1: 0 points Say the months of the year in reverse: 0 points Repeat the address phrase from earlier: 0 points 6 CIT Score: 0 points  Advance Directives (For Healthcare) Does Patient Have a Medical Advance Directive?: Yes Does patient want to make changes to medical  advance directive?: No -  Patient declined Type of Advance Directive: Healthcare Power of [place]; Living will; Out of facility DNR (pink MOST or yellow form) Copy of Healthcare Power of Attorney in Chart?: No - copy requested Copy of Living Will in Chart?: No - copy requested Out of facility DNR (pink MOST or yellow form) in Chart? (Ambulatory ONLY): No - copy requested  Reviewed/Updated  Reviewed/Updated: Reviewed All (Medical, Surgical, Family, Medications, Allergies, Care Teams, Patient Goals)    Allergies (verified) Sulfa antibiotics, Sulfasalazine, Nabumetone, Naproxen sodium, and Tramadol hcl   Current Medications (verified) Outpatient Encounter Medications as of 06/21/2024  Medication Sig   acetaminophen  (TYLENOL ) 650 MG CR tablet Take 1,300 mg by mouth every 8 (eight) hours as needed for pain.   amLODipine  (NORVASC ) 10 MG tablet Take 1 tablet (10 mg total) by mouth daily.   Apoaequorin (PREVAGEN PO) Take 20 mg by mouth daily.   Ascorbic Acid (VITAMIN C) 1000 MG tablet Take 1,000 mg by mouth daily.   aspirin  EC 81 MG tablet Take 81 mg by mouth. Swallow whole.   CALCIUM -MAGNESIUM-ZINC PO Take 3 tablets by mouth daily.   Cholecalciferol (DIALYVITE VITAMIN D  5000) 125 MCG (5000 UT) capsule Take 5,000 Units by mouth daily.   denosumab  (PROLIA ) 60 MG/ML SOLN injection Inject 60 mg into the skin every 6 (six) months. Administer in upper arm, thigh, or abdomen   diclofenac  (VOLTAREN ) 75 MG EC tablet TAKE ONE TABLET BY MOUTH TWICE DAILY   hydrocortisone  2.5 % cream Apply topically 2 (two) times daily.   latanoprost  (XALATAN ) 0.005 % ophthalmic solution Place 1 drop into both eyes at bedtime.   mirabegron  ER (MYRBETRIQ ) 50 MG TB24 tablet Take 1 tablet (50 mg total) by mouth daily.   mirtazapine  (REMERON ) 7.5 MG tablet Take 1 tablet (7.5 mg total) by mouth at bedtime.   multivitamin-lutein (OCUVITE-LUTEIN) CAPS capsule Take 1 capsule by mouth daily.   mupirocin  ointment (BACTROBAN ) 2 %  APPLY TO THE AFFECTED AREA(S) TOPICALLY TWICE DAILY   pantoprazole  (PROTONIX ) 40 MG tablet TAKE ONE TABLET BY MOUTH DAILY   timolol  (TIMOPTIC ) 0.5 % ophthalmic solution Place 1 drop into both eyes 2 (two) times daily.   vitamin B-12 (CYANOCOBALAMIN ) 1000 MCG tablet Take 1,000 mcg by mouth daily.   vitamin E 400 UNIT capsule Take 400 Units by mouth daily.   No facility-administered encounter medications on file as of 06/21/2024.    History: Past Medical History:  Diagnosis Date   Acute maxillary sinusitis    Allergy    Bundle branch block, unspecified    EKG 4/13 with clearance and note that isnt new block Dr Mavis on chart   Carpal tunnel syndrome    Cystocele    Detrusor instability    GERD (gastroesophageal reflux disease)    Glaucoma    Hyperlipidemia    Localized osteoarthrosis not specified whether primary or secondary, lower leg    Osteoporosis 01/2014   T score -2.5 UD forarm, stable at the spine recommend 2 year follow up DEXA   PONV (postoperative nausea and vomiting)    19 yrs ago- states has had general anesthesia since and did well   Unspecified adverse effect of unspecified drug, medicinal and biological substance    Uterine prolapse    Past Surgical History:  Procedure Laterality Date   arthroscopic r knee     BALLOON DILATION N/A 11/05/2021   Procedure: BALLOON DILATION;  Surgeon: Wilhelmenia Aloha Raddle., MD;  Location: [facility] ENDOSCOPY;  Service: Gastroenterology;  Laterality: N/A;  BIOPSY  11/05/2021   Procedure: BIOPSY;  Surgeon: Wilhelmenia Aloha Raddle., MD;  Location: [facility] ENDOSCOPY;  Service: Gastroenterology;;   ORIN MEDIATE RELEASE  2011   CHOLECYSTECTOMY N/A 11/06/2021   Procedure: LAPAROSCOPIC CHOLECYSTECTOMY;  Surgeon: Teresa Lonni HERO, MD;  Location: [facility] OR;  Service: General;  Laterality: N/A;   CHOLECYSTECTOMY  11/06/2021   Procedure: Intraoperative cholangiogram with ICG;  Surgeon: Teresa Lonni HERO, MD;  Location: [facility] OR;  Service: General;;    COLONOSCOPY     DILATION AND CURETTAGE OF UTERUS  1982   ERCP N/A 11/05/2021   Procedure: ENDOSCOPIC RETROGRADE CHOLANGIOPANCREATOGRAPHY (ERCP);  Surgeon: Wilhelmenia Aloha Raddle., MD;  Location: [facility] ENDOSCOPY;  Service: Gastroenterology;  Laterality: N/A;   EYE SURGERY     bilateral cataract extraction  with IOL   FLEXIBLE SIGMOIDOSCOPY     HYSTEROSCOPY     HYSTEROSCOPY WITH D & C  2005 and 2008   IR SACROPLASTY BILATERAL  06/07/2020   JOINT REPLACEMENT     bilateral total hip arthroplasty   REMOVAL OF STONES  11/05/2021   Procedure: REMOVAL OF STONES;  Surgeon: Wilhelmenia Aloha Raddle., MD;  Location: [facility] ENDOSCOPY;  Service: Gastroenterology;;   ANNETT  11/05/2021   Procedure: ANNETT;  Surgeon: Wilhelmenia Aloha Raddle., MD;  Location: [facility] ENDOSCOPY;  Service: Gastroenterology;;   total hip rerplacement bilateral     TOTAL KNEE ARTHROPLASTY  10/13/2011   Procedure: TOTAL KNEE ARTHROPLASTY;  Surgeon: Dempsey LULLA Moan, MD;  Location: [facility] ORS;  Service: Orthopedics;  Laterality: Right;   UPPER GI ENDOSCOPY  07/2018 and 3/20   Family History  Problem Relation Age of Onset   Dementia Mother    Breast cancer Mother 70   Heart disease Father    Stroke Father    Dementia Sister    Colon cancer Neg Hx    Stomach cancer Neg Hx    Rectal cancer Neg Hx    Esophageal cancer Neg Hx    Colon polyps Neg Hx    Pancreatic cancer Neg Hx    Social History   Occupational History   Occupation: retired  Tobacco Use   Smoking status: Former    Current packs/day: 0.00    Types: Cigarettes    Quit date: 10/06/1958    Years since quitting: 65.7   Smokeless tobacco: Never  Vaping Use   Vaping status: Never Used  Substance and Sexual Activity   Alcohol use: Not Currently   Drug use: No   Sexual activity: Not Currently    Birth control/protection: Post-menopausal    Comment: 1st intercourse 40 yo-1 partner   Tobacco Counseling Counseling given: Not Answered  SDOH Screenings   Food  Insecurity: No Food Insecurity (06/21/2024)  Housing: Low Risk (06/21/2024)  Transportation Needs: No Transportation Needs (06/21/2024)  Utilities: Not At Risk (06/21/2024)  Alcohol Screen: Low Risk (05/15/2022)  Depression (PHQ2-9): Low Risk (06/21/2024)  Financial Resource Strain: Low Risk (05/15/2022)  Physical Activity: Sufficiently Active (06/21/2024)  Social Connections: Moderately Integrated (06/21/2024)  Stress: No Stress Concern Present (06/21/2024)  Tobacco Use: Medium Risk (06/21/2024)  Health Literacy: Adequate Health Literacy (06/21/2024)   See flowsheets for full screening details  Depression Screen PHQ 2 & 9 Depression Scale- Over the past 2 weeks, how often have you been bothered by any of the following problems? Little interest or pleasure in doing things: 0 Feeling down, depressed, or hopeless (PHQ Adolescent also includes...irritable): 0 PHQ-2 Total Score: 0 Trouble falling or staying asleep, or sleeping too much: 0 Feeling  tired or having little energy: 0 Poor appetite or overeating (PHQ Adolescent also includes...weight loss): 0 Feeling bad about yourself - or that you are a failure or have let yourself or your family down: 0 Trouble concentrating on things, such as reading the newspaper or watching television (PHQ Adolescent also includes...like school work): 0 Moving or speaking so slowly that other people could have noticed. Or the opposite - being so fidgety or restless that you have been moving around a lot more than usual: 0 Thoughts that you would be better off dead, or of hurting yourself in some way: 0 PHQ-9 Total Score: 0 If you checked off any problems, how difficult have these problems made it for you to do your work, take care of things at home, or get along with other people?: Not difficult at all  Depression Treatment Depression Interventions/Treatment : EYV7-0 Score <4 Follow-up Not Indicated     Goals Addressed             This Visit's Progress     Patient Stated   On track    Maintain current health status and stay physically and socially active.             Objective:    Today's Vitals   06/21/24 1413  BP: 120/80  Weight: 126 lb (57.2 kg)  Height: 5' (1.524 m)   Body mass index is 24.61 kg/m.  Hearing/Vision screen Hearing Screening - Comments:: No difficulties Vision Screening - Comments:: Patient wears glasses  Immunizations and Health Maintenance Health Maintenance  Topic Date Due   Zoster Vaccines- Shingrix (1 of 2) Never done   Medicare Annual Wellness (AWV)  05/16/2023   COVID-19 Vaccine (7 - 2025-26 season) 02/01/2024   Pneumococcal Vaccine: 50+ Years  Completed   Influenza Vaccine  Completed   Bone Density Scan  Completed   Meningococcal B Vaccine  Aged Out   DTaP/Tdap/Td  Discontinued   Mammogram  Discontinued        Assessment/Plan:  This is a routine wellness examination for S[place].  Patient Care Team: Rollene Almarie LABOR, MD as PCP - General (Internal Medicine) Fontaine, Evalene SQUIBB, MD (Inactive) as Consulting Physician (Gynecology) Patrcia Sharper, MD as Consulting Physician (Ophthalmology) Aneita Gwendlyn DASEN, MD (Inactive) as Consulting Physician (Gastroenterology)  I have personally reviewed and noted the following in the patients chart:   Medical and social history Use of alcohol, tobacco or illicit drugs  Current medications and supplements including opioid prescriptions. Functional ability and status Nutritional status Physical activity Advanced directives List of other physicians Hospitalizations, surgeries, and ER visits in previous 12 months Vitals Screenings to include cognitive, depression, and falls Referrals and appointments  No orders of the defined types were placed in this encounter.  In addition, I have reviewed and discussed with patient certain preventive protocols, quality metrics, and best practice recommendations. A written personalized care plan for preventive  services as well as general preventive health recommendations were provided to patient.   Lyle MARLA Right, [place]   06/21/2024   No follow-ups on file.  After Visit Summary: (MyChart) Due to this being a telephonic visit, the after visit summary with patients personalized plan was offered to patient via MyChart   No voiced or noted concerns at this time Vaccines not given: covid and shingles  declined today  "

## 2024-06-21 NOTE — Patient Instructions (Signed)
 Leah [name],  Thank you for taking the time for your Medicare Wellness Visit. I appreciate your continued commitment to your health goals. Please review the care plan we discussed, and feel free to reach out if I can assist you further.  Please note that Annual Wellness Visits do not include a physical exam. Some assessments may be limited, especially if the visit was conducted virtually. If needed, we may recommend an in-person follow-up with your provider.  Ongoing Care Seeing your primary care provider every 3 to 6 months helps us  monitor your health and provide consistent, personalized care.   Referrals If a referral was made during today's visit and you haven't received any updates within two weeks, please contact the referred provider directly to check on the status.  Recommended Screenings:  Health Maintenance  Topic Date Due   Zoster (Shingles) Vaccine (1 of 2) Never done   Medicare Annual Wellness Visit  05/16/2023   COVID-19 Vaccine (7 - 2025-26 season) 02/01/2024   Pneumococcal Vaccine for age over 81  Completed   Flu Shot  Completed   Osteoporosis screening with Bone Density Scan  Completed   Meningitis B Vaccine  Aged Out   DTaP/Tdap/Td vaccine  Discontinued   Breast Cancer Screening  Discontinued       06/21/2024    2:18 PM  Advanced Directives  Does Patient Have a Medical Advance Directive? Yes  Type of Estate Agent of [place];Living will;Out of facility DNR (pink MOST or yellow form)  Does patient want to make changes to medical advance directive? No - Patient declined  Copy of Healthcare Power of Attorney in Chart? No - copy requested    Vision: Annual vision screenings are recommended for early detection of glaucoma, cataracts, and diabetic retinopathy. These exams can also reveal signs of chronic conditions such as diabetes and high blood pressure.  Dental: Annual dental screenings help detect early signs of oral cancer, gum disease, and  other conditions linked to overall health, including heart disease and diabetes.  Please see the attached documents for additional preventive care recommendations.

## 2024-06-22 ENCOUNTER — Telehealth: Payer: Self-pay

## 2024-06-22 NOTE — Telephone Encounter (Signed)
 Copied from CRM #[id number]. Topic: Clinical - Prescription Issue >> Jun 21, 2024  4:37 PM Jasmin G wrote: Reason for CRM: Pt was told by [facility] [place], [place] - [address] that mirabegron  ER (MYRBETRIQ ) 50 MG TB24 tablet is not covered by [facility] and requested a call back at [phone] to discuss further options.

## 2024-06-24 NOTE — Telephone Encounter (Signed)
Ok to do PA if needed

## 2024-06-29 ENCOUNTER — Other Ambulatory Visit (HOSPITAL_COMMUNITY): Payer: Self-pay

## 2024-06-29 NOTE — Telephone Encounter (Signed)
 Pt is aware.
